# Patient Record
Sex: Female | Born: 1955 | Race: White | Hispanic: No | Smoking: Former smoker
Health system: Southern US, Community
[De-identification: ages and names within clinical notes are randomized; demographics above are authoritative.]

## PROBLEM LIST (undated history)

## (undated) DIAGNOSIS — I10 Essential (primary) hypertension: Secondary | ICD-10-CM

## (undated) DIAGNOSIS — F329 Major depressive disorder, single episode, unspecified: Secondary | ICD-10-CM

## (undated) DIAGNOSIS — F32A Depression, unspecified: Secondary | ICD-10-CM

## (undated) DIAGNOSIS — G4700 Insomnia, unspecified: Secondary | ICD-10-CM

## (undated) DIAGNOSIS — R569 Unspecified convulsions: Secondary | ICD-10-CM

## (undated) DIAGNOSIS — T7840XA Allergy, unspecified, initial encounter: Secondary | ICD-10-CM

## (undated) DIAGNOSIS — M1990 Unspecified osteoarthritis, unspecified site: Secondary | ICD-10-CM

## (undated) DIAGNOSIS — F419 Anxiety disorder, unspecified: Secondary | ICD-10-CM

## (undated) DIAGNOSIS — J45909 Unspecified asthma, uncomplicated: Secondary | ICD-10-CM

## (undated) DIAGNOSIS — Z87442 Personal history of urinary calculi: Secondary | ICD-10-CM

## (undated) HISTORY — DX: Unspecified asthma, uncomplicated: J45.909

## (undated) HISTORY — PX: CHOLECYSTECTOMY: SHX55

## (undated) HISTORY — DX: Anxiety disorder, unspecified: F41.9

## (undated) HISTORY — PX: ELBOW SURGERY: SHX618

## (undated) HISTORY — PX: JOINT REPLACEMENT: SHX530

## (undated) HISTORY — PX: OTHER SURGICAL HISTORY: SHX169

## (undated) HISTORY — DX: Allergy, unspecified, initial encounter: T78.40XA

## (undated) HISTORY — DX: Insomnia, unspecified: G47.00

## (undated) HISTORY — DX: Unspecified convulsions: R56.9

## (undated) HISTORY — PX: APPENDECTOMY: SHX54

---

## 1999-09-22 ENCOUNTER — Ambulatory Visit (HOSPITAL_BASED_OUTPATIENT_CLINIC_OR_DEPARTMENT_OTHER): Admission: RE | Admit: 1999-09-22 | Discharge: 1999-09-22 | Payer: Self-pay | Admitting: Orthopedic Surgery

## 2003-09-26 ENCOUNTER — Emergency Department (HOSPITAL_COMMUNITY): Admission: EM | Admit: 2003-09-26 | Discharge: 2003-09-26 | Payer: Self-pay | Admitting: Emergency Medicine

## 2003-11-14 ENCOUNTER — Ambulatory Visit (HOSPITAL_COMMUNITY): Payer: Self-pay | Admitting: Professional Counselor

## 2003-11-19 ENCOUNTER — Ambulatory Visit (HOSPITAL_COMMUNITY): Payer: Self-pay | Admitting: Psychiatry

## 2003-12-02 ENCOUNTER — Ambulatory Visit (HOSPITAL_COMMUNITY): Payer: Self-pay | Admitting: Professional Counselor

## 2003-12-17 ENCOUNTER — Ambulatory Visit (HOSPITAL_COMMUNITY): Payer: Self-pay | Admitting: Psychiatry

## 2004-02-25 ENCOUNTER — Ambulatory Visit (HOSPITAL_COMMUNITY): Payer: Self-pay | Admitting: Psychiatry

## 2004-05-12 ENCOUNTER — Ambulatory Visit (HOSPITAL_COMMUNITY): Payer: Self-pay | Admitting: Psychiatry

## 2004-08-16 ENCOUNTER — Ambulatory Visit (HOSPITAL_COMMUNITY): Payer: Self-pay | Admitting: Psychiatry

## 2004-10-18 ENCOUNTER — Ambulatory Visit (HOSPITAL_COMMUNITY): Payer: Self-pay | Admitting: Psychiatry

## 2005-03-21 ENCOUNTER — Ambulatory Visit (HOSPITAL_COMMUNITY): Payer: Self-pay | Admitting: Psychiatry

## 2005-06-22 ENCOUNTER — Ambulatory Visit (HOSPITAL_COMMUNITY): Payer: Self-pay | Admitting: Psychiatry

## 2005-09-19 ENCOUNTER — Ambulatory Visit (HOSPITAL_COMMUNITY): Payer: Self-pay | Admitting: Psychiatry

## 2006-04-21 ENCOUNTER — Emergency Department (HOSPITAL_COMMUNITY): Admission: EM | Admit: 2006-04-21 | Discharge: 2006-04-21 | Payer: Self-pay | Admitting: *Deleted

## 2009-06-19 ENCOUNTER — Emergency Department (HOSPITAL_COMMUNITY): Admission: EM | Admit: 2009-06-19 | Discharge: 2009-06-19 | Payer: Self-pay | Admitting: Emergency Medicine

## 2009-09-18 ENCOUNTER — Emergency Department (HOSPITAL_COMMUNITY): Admission: EM | Admit: 2009-09-18 | Discharge: 2009-09-18 | Payer: Self-pay | Admitting: Emergency Medicine

## 2010-01-16 ENCOUNTER — Encounter
Admission: RE | Admit: 2010-01-16 | Discharge: 2010-01-16 | Payer: Self-pay | Source: Home / Self Care | Attending: Neurosurgery | Admitting: Neurosurgery

## 2010-03-04 ENCOUNTER — Ambulatory Visit: Payer: Medicaid Other | Attending: Orthopedic Surgery | Admitting: Physical Therapy

## 2010-03-04 DIAGNOSIS — M6281 Muscle weakness (generalized): Secondary | ICD-10-CM | POA: Insufficient documentation

## 2010-03-04 DIAGNOSIS — R262 Difficulty in walking, not elsewhere classified: Secondary | ICD-10-CM | POA: Insufficient documentation

## 2010-03-04 DIAGNOSIS — M25669 Stiffness of unspecified knee, not elsewhere classified: Secondary | ICD-10-CM | POA: Insufficient documentation

## 2010-03-04 DIAGNOSIS — M25569 Pain in unspecified knee: Secondary | ICD-10-CM | POA: Insufficient documentation

## 2010-03-04 DIAGNOSIS — IMO0001 Reserved for inherently not codable concepts without codable children: Secondary | ICD-10-CM | POA: Insufficient documentation

## 2010-03-05 ENCOUNTER — Ambulatory Visit (HOSPITAL_COMMUNITY): Payer: Self-pay | Admitting: Psychiatry

## 2010-03-15 ENCOUNTER — Encounter: Payer: Medicaid Other | Admitting: Physical Therapy

## 2010-03-23 ENCOUNTER — Encounter: Payer: Medicaid Other | Admitting: Physical Therapy

## 2010-04-05 ENCOUNTER — Encounter: Payer: Medicaid Other | Admitting: Physical Therapy

## 2010-04-19 ENCOUNTER — Encounter: Payer: Medicaid Other | Admitting: Physical Therapy

## 2010-04-28 ENCOUNTER — Ambulatory Visit: Payer: Medicaid Other | Admitting: Physical Therapy

## 2010-05-21 NOTE — Op Note (Signed)
[facility]  Patient:    Carla Leonard, Carla Leonard                      MRN: [id number] Proc. Date: 09/22/99 Adm. Date:  [id number] Attending:  Milly Jakob                           Operative Report  PREOPERATIVE DIAGNOSIS:  Lateral epicondylitis.  POSTOPERATIVE DIAGNOSES: 1. Lateral epicondylitis 2. Radiocapitellar degenerative change.  PROCEDURE: 1. Nirschl procedure with removal of origin of extensor carpi radialis brevis. 2. Irrigation and debridement of radiocapitellar joint.  SURGEON:  Harvie Junior, M.D.  ASSISTANT:  Currie Paris. Thedore Mins.  ANESTHESIA:  General.  BRIEF HISTORY:  This is 55 year old female with a long history of having significant pain in the area of the right elbow after an injury at work.  This was treated conservatively and she had improvement and then recurrence of her symptoms and ultimately proceeded down hill through 3 injections and physical therapy.  She did have an EMG performed which showed she had no radial nerve abnormalities and because of persistent pain and failure of conservative care she was taken to the operating room for debridement of lateral epicondylar tissue.  PROCEDURE:  The patient was taken to the operating room and after adequate anesthesia was obtained by general anesthetic the patient was placed on the operating table.  The right arm was then prepped and draped in the usual sterile fashion.  Following Esmarch exsanguination with tourniquet the blood pressure tourniquet was inflated to 250 mmHg.  Following this a curvilinear incision was made and centered over the lateral epicondyle.  Subcutaneous tissue was incised to the level of the epicondylar fascia.  The division between the extensor carpi radialis longus and the extensor digitorum communis was identified and longitudinally incised.  Flaps were raised over the lateral epicondylar tissue.  The extensor carpi radialis brevis was  identified and noted to have really a injury where the brevis had pulled away from bone.  The diseased tissue was then excised and significant amounts of joint fluid did come through a rent in the brevis origin.  At that point small flecks of cartilage were coming out of this small opening in the brevis origin and so the joint was then opened and inspected.  There was small degenerative changes in the joint.  This was copiously irrigated and suctioned dry.  All loosened fragmented pieces were irrigated and debrided at this point.  The extensor brevis origin was then reattached to bone after the bone was freshened with a rongeur and good bleeding surface was identified.  Following this the interval between the extensor longus and the extensor digitorum communis was closed with 0 Vicryl interrupted suture.  The subcutaneous was then closed with 0 and 2-0 Vicryl and the skin with a nylon suture.  At this point a sterile compressive dressing was applied and the patient was placed into a posterior splint and taken to recovery room.  She was noted to be in satisfactory condition.  ESTIMATED BLOOD LOSS:  None. DD:  09/22/99 TD:  09/23/99 Job: [id number]

## 2010-06-07 ENCOUNTER — Encounter (HOSPITAL_COMMUNITY)
Admission: RE | Admit: 2010-06-07 | Discharge: 2010-06-07 | Disposition: A | Discharge status: Home / Self Care | Payer: Medicaid Other | Source: Ambulatory Visit | Attending: Orthopedic Surgery | Admitting: Orthopedic Surgery

## 2010-06-07 ENCOUNTER — Other Ambulatory Visit (HOSPITAL_COMMUNITY): Payer: Self-pay | Admitting: Orthopedic Surgery

## 2010-06-07 ENCOUNTER — Ambulatory Visit (HOSPITAL_COMMUNITY)
Admission: RE | Admit: 2010-06-07 | Discharge: 2010-06-07 | Disposition: A | Discharge status: Home / Self Care | Payer: Medicaid Other | Source: Ambulatory Visit | Attending: Orthopedic Surgery | Admitting: Orthopedic Surgery

## 2010-06-07 DIAGNOSIS — Z01810 Encounter for preprocedural cardiovascular examination: Secondary | ICD-10-CM | POA: Insufficient documentation

## 2010-06-07 DIAGNOSIS — Z01811 Encounter for preprocedural respiratory examination: Secondary | ICD-10-CM

## 2010-06-07 DIAGNOSIS — Z01812 Encounter for preprocedural laboratory examination: Secondary | ICD-10-CM | POA: Insufficient documentation

## 2010-06-07 DIAGNOSIS — I10 Essential (primary) hypertension: Secondary | ICD-10-CM | POA: Insufficient documentation

## 2010-06-07 DIAGNOSIS — F17200 Nicotine dependence, unspecified, uncomplicated: Secondary | ICD-10-CM | POA: Insufficient documentation

## 2010-06-07 DIAGNOSIS — Z01818 Encounter for other preprocedural examination: Secondary | ICD-10-CM | POA: Insufficient documentation

## 2010-06-07 DIAGNOSIS — M47814 Spondylosis without myelopathy or radiculopathy, thoracic region: Secondary | ICD-10-CM | POA: Insufficient documentation

## 2010-06-07 LAB — DIFFERENTIAL
Basophils Absolute: 0.1 10*3/uL (ref 0.0–0.1)
Basophils Relative: 1 % (ref 0–1)
Eosinophils Relative: 5 % (ref 0–5)
Lymphocytes Relative: 26 % (ref 12–46)
Lymphs Abs: 2.4 10*3/uL (ref 0.7–4.0)
Monocytes Absolute: 0.8 10*3/uL (ref 0.1–1.0)
Monocytes Relative: 9 % (ref 3–12)
Neutro Abs: 5.3 10*3/uL (ref 1.7–7.7)
Neutrophils Relative %: 59 % (ref 43–77)

## 2010-06-07 LAB — URINE MICROSCOPIC-ADD ON

## 2010-06-07 LAB — COMPREHENSIVE METABOLIC PANEL
AST: 29 U/L (ref 0–37)
BUN: 8 mg/dL (ref 6–23)
Creatinine, Ser: 1.01 mg/dL (ref 0.4–1.2)
GFR calc non Af Amer: 57 mL/min — ABNORMAL LOW (ref >60)
Glucose, Bld: 96 mg/dL (ref 70–99)
Sodium: 142 mEq/L (ref 135–145)

## 2010-06-07 LAB — URINALYSIS, ROUTINE W REFLEX MICROSCOPIC
Bilirubin Urine: NEGATIVE
Glucose, UA: NEGATIVE mg/dL
Hgb urine dipstick: NEGATIVE
Ketones, ur: NEGATIVE mg/dL
Specific Gravity, Urine: 1.018 (ref 1.005–1.030)
pH: 7.5 (ref 5.0–8.0)

## 2010-06-07 LAB — CBC
HCT: 45.3 % (ref 36.0–46.0)
MCHC: 32.7 g/dL (ref 30.0–36.0)
RBC: 4.73 MIL/uL (ref 3.87–5.11)

## 2010-06-07 LAB — TYPE AND SCREEN: ABO/RH(D): O POS

## 2010-06-07 LAB — SURGICAL PCR SCREEN: Staphylococcus aureus: NEGATIVE

## 2010-06-07 LAB — APTT: aPTT: 46 seconds — ABNORMAL HIGH (ref 24–37)

## 2010-06-07 LAB — PROTIME-INR: INR: 1.01 (ref 0.00–1.49)

## 2010-06-09 ENCOUNTER — Inpatient Hospital Stay (HOSPITAL_COMMUNITY)
Admission: RE | Admit: 2010-06-09 | Discharge: 2010-06-13 | DRG: 470 | Disposition: A | Discharge status: Home / Self Care | Payer: Medicare Other | Source: Ambulatory Visit | Attending: Orthopedic Surgery | Admitting: Orthopedic Surgery

## 2010-06-09 DIAGNOSIS — M1710 Unilateral primary osteoarthritis, unspecified knee: Principal | ICD-10-CM | POA: Diagnosis present

## 2010-06-09 DIAGNOSIS — J4489 Other specified chronic obstructive pulmonary disease: Secondary | ICD-10-CM | POA: Diagnosis present

## 2010-06-09 DIAGNOSIS — J449 Chronic obstructive pulmonary disease, unspecified: Secondary | ICD-10-CM | POA: Diagnosis present

## 2010-06-09 DIAGNOSIS — I10 Essential (primary) hypertension: Secondary | ICD-10-CM | POA: Diagnosis present

## 2010-06-09 LAB — PROTIME-INR: INR: 1 (ref 0.00–1.49)

## 2010-06-10 LAB — CBC
MCHC: 32.2 g/dL (ref 30.0–36.0)
MCV: 95.5 fL (ref 78.0–100.0)
Platelets: 253 10*3/uL (ref 150–400)
RBC: 4 MIL/uL (ref 3.87–5.11)
RDW: 14.2 % (ref 11.5–15.5)
WBC: 9.7 10*3/uL (ref 4.0–10.5)

## 2010-06-10 LAB — PROTIME-INR: Prothrombin Time: 15.4 seconds — ABNORMAL HIGH (ref 11.6–15.2)

## 2010-06-10 NOTE — Op Note (Signed)
NAMEMarland Kitchen  AVRIL, BUSSER NO.:  [id number]  MEDICAL RECORD NO.:  [id number]  LOCATION:  5039                         FACILITY:  [facility]  PHYSICIAN:  Harvie Junior, M.D.   DATE OF BIRTH:  [birth year]  DATE OF PROCEDURE:  06/09/2010 DATE OF DISCHARGE:                              OPERATIVE REPORT   PREOPERATIVE NOTE:  End-stage degenerative joint disease right knee.  POSTOPERATIVE DIAGNOSIS:  End-stage degenerative joint disease right knee.  PROCEDURE:  Right total knee replacement with a Sigma system size 3 femur, size 3 MBT revision tray tibia, 12.5-mm bridging bearing, and a 35-mm all polyethylene patella.  SURGEON:  Harvie Junior, MD  ASSISTANT:  Marshia Ly, PA  ANESTHESIA:  General.  BRIEF HISTORY:  Carla Leonard is a 55 year old female with a long history of having significant degenerative joint disease in bilateral knees. She had been treated conservatively for a  long period of time.  Because of continued complaints of pain, she was ultimately evaluated in the office and felt to need bilateral total knee replacement.  We had a long talk regarding treatment options and felt that given her large size that a single knee replacement at that time would be most appropriate and she was brought to the operating room for right total knee replacement after failure of all conservative care.  Because of her large size, it was chosen that a computer would be appropriate to try a perfect neutral long alignment, we felt that an MBT revision tray would be appropriate in trying to minimize the need for tibial revision.  PROCEDURE:  The patient was brought to the operating room.  After adequate anesthesia was obtained with general anesthetic, the patient was placed supine on the operating table.  The right leg was then prepped and draped in usual sterile fashion.  Following this, the leg was exsanguinated and blood pressure tourniquet inflated to 350  mmHg. Following this, a midline incision was made subcutaneously extending down the level of the extensor mechanism and a medial parapatellar arthrotomy was undertaken.  Once this was done, the anterior and posterior cruciates were excised as well as the retropatellar fat pad, medial and lateral meniscus and the synovium on the anterior aspect of the femur.  Following this, the computer modules were placed, two pins in the tibia, two pins in the femur, and the arrays were placed and the registration process undertaken using the computer for 30 minutes of surgical procedure.  Once this was completed, attention was turned towards the tibia which was cut perpendicular to its long axis with computer assistance.  Attention was then turned to the femur which cut perpendicular to its long and to the anatomic axis under computer assistance.  After this was done, spacer blocks were put into place. Once that was completed, we got easy neutral long alignment and attention was then turned to the femur where anterior-posterior cuts were made as well as chamfer cuts and a box cut.  Once that was completed, then the attention was turned to the tibia which was sized, drilled and keeled and a size 3 MBT revision trial was put in place and a size 3 femur was put in  place.  Once that was completed now attention was turned towards the patella which was cut down to the level of 14, then a trial 35-mm patella was put in place.  Once that was completed, the trials were in place and the components were then removed.  The knee was copiously and thoroughly lavaged and suctioned dry.  Once that was completed, the final components were then cemented into place with a size 3 tibia, size 3 femur and once it was completed, the final components were cemented into place, sized with 3 MBT revision tray, size 3 femur, 12.5-mm bridging bearing, and 35-mm all poly patella. Once the cement was allowed to harden, tourniquet  was let down.  All bleeders controlled with electrocautery and the medium Hemovac drain was then placed.  The final poly was then placed and the knee was then closed.  The medial parapatellar arthrotomy was closed with 1 Vicryl running, skin with 0 and 2-0 Vicryl and skin staples.  Sterile compressive dressing was applied as well as knee immobilizer.  The patient was taken to recovery room where she was noted to be in satisfactory condition.  Estimated blood loss during the procedure was less than 50 mL.     Harvie Junior, M.D.     Ranae Plumber  D:  06/09/2010  T:  06/10/2010  Job:  [id number]  Electronically Signed by Jodi Geralds M.D. on 06/10/2010 02:13:24 PM

## 2010-06-11 LAB — CBC
HCT: 36.9 % (ref 36.0–46.0)
MCHC: 31.7 g/dL (ref 30.0–36.0)
MCV: 95.6 fL (ref 78.0–100.0)
Platelets: 252 10*3/uL (ref 150–400)
RBC: 3.86 MIL/uL — ABNORMAL LOW (ref 3.87–5.11)
RDW: 14.4 % (ref 11.5–15.5)
WBC: 9.7 10*3/uL (ref 4.0–10.5)

## 2010-06-11 LAB — PROTIME-INR
INR: 1.73 — ABNORMAL HIGH (ref 0.00–1.49)
Prothrombin Time: 20.4 seconds — ABNORMAL HIGH (ref 11.6–15.2)

## 2010-06-12 ENCOUNTER — Inpatient Hospital Stay (HOSPITAL_COMMUNITY): Payer: Medicare Other

## 2010-06-12 LAB — CBC
HCT: 32.3 % — ABNORMAL LOW (ref 36.0–46.0)
Hemoglobin: 10.3 g/dL — ABNORMAL LOW (ref 12.0–15.0)
RBC: 3.41 MIL/uL — ABNORMAL LOW (ref 3.87–5.11)
WBC: 8.2 10*3/uL (ref 4.0–10.5)

## 2010-06-12 LAB — PROTIME-INR
INR: 3.81 — ABNORMAL HIGH (ref 0.00–1.49)
Prothrombin Time: 37.5 seconds — ABNORMAL HIGH (ref 11.6–15.2)

## 2010-06-13 LAB — PROTIME-INR
INR: 3.73 — ABNORMAL HIGH (ref 0.00–1.49)
Prothrombin Time: 36.9 seconds — ABNORMAL HIGH (ref 11.6–15.2)

## 2010-06-16 ENCOUNTER — Emergency Department (HOSPITAL_COMMUNITY)
Admission: EM | Admit: 2010-06-16 | Discharge: 2010-06-16 | Disposition: A | Discharge status: Home / Self Care | Payer: Medicaid Other | Attending: Emergency Medicine | Admitting: Emergency Medicine

## 2010-06-16 DIAGNOSIS — Z96659 Presence of unspecified artificial knee joint: Secondary | ICD-10-CM | POA: Insufficient documentation

## 2010-06-16 DIAGNOSIS — F3289 Other specified depressive episodes: Secondary | ICD-10-CM | POA: Insufficient documentation

## 2010-06-16 DIAGNOSIS — M7989 Other specified soft tissue disorders: Secondary | ICD-10-CM | POA: Insufficient documentation

## 2010-06-16 DIAGNOSIS — M79609 Pain in unspecified limb: Secondary | ICD-10-CM

## 2010-06-16 DIAGNOSIS — F329 Major depressive disorder, single episode, unspecified: Secondary | ICD-10-CM | POA: Insufficient documentation

## 2010-06-16 LAB — CBC
Hemoglobin: 10.8 g/dL — ABNORMAL LOW (ref 12.0–15.0)
MCH: 29.8 pg (ref 26.0–34.0)
Platelets: 305 10*3/uL (ref 150–400)
RBC: 3.63 MIL/uL — ABNORMAL LOW (ref 3.87–5.11)
WBC: 9.1 10*3/uL (ref 4.0–10.5)

## 2010-06-16 LAB — DIFFERENTIAL
Basophils Absolute: 0.1 10*3/uL (ref 0.0–0.1)
Basophils Relative: 1 % (ref 0–1)
Eosinophils Absolute: 0.9 10*3/uL — ABNORMAL HIGH (ref 0.0–0.7)
Monocytes Relative: 10 % (ref 3–12)
Neutro Abs: 5.4 10*3/uL (ref 1.7–7.7)
Neutrophils Relative %: 60 % (ref 43–77)

## 2010-06-16 LAB — BASIC METABOLIC PANEL
CO2: 32 mEq/L (ref 19–32)
Calcium: 9.5 mg/dL (ref 8.4–10.5)
Potassium: 3.3 mEq/L — ABNORMAL LOW (ref 3.5–5.1)
Sodium: 138 mEq/L (ref 135–145)

## 2010-07-21 ENCOUNTER — Ambulatory Visit: Payer: Medicaid Other | Attending: Orthopedic Surgery | Admitting: Physical Therapy

## 2010-07-21 DIAGNOSIS — M25569 Pain in unspecified knee: Secondary | ICD-10-CM | POA: Insufficient documentation

## 2010-07-21 DIAGNOSIS — IMO0001 Reserved for inherently not codable concepts without codable children: Secondary | ICD-10-CM | POA: Insufficient documentation

## 2010-07-21 DIAGNOSIS — M25669 Stiffness of unspecified knee, not elsewhere classified: Secondary | ICD-10-CM | POA: Insufficient documentation

## 2010-07-21 DIAGNOSIS — R262 Difficulty in walking, not elsewhere classified: Secondary | ICD-10-CM | POA: Insufficient documentation

## 2010-07-21 NOTE — Discharge Summary (Signed)
NAMEMarland Kitchen  ROREY, HODGES NO.:  [id number]  MEDICAL RECORD NO.:  [id number]  LOCATION:  5039                         FACILITY:  [facility]  PHYSICIAN:  Harvie Junior, M.D.   DATE OF BIRTH:  1955-06-13  DATE OF ADMISSION:  06/09/2010 DATE OF DISCHARGE:  06/13/2010                              DISCHARGE SUMMARY   ADMITTING DIAGNOSES: 1. End-stage degenerative joint disease, right knee. 2. Hypertension. 3. Chronic depression. 4. Chronic low back pain. 5. Morbid obesity.  DISCHARGE DIAGNOSES: 1. End-stage degenerative joint disease, right knee. 2. Hypertension. 3. Chronic depression. 4. Chronic low back pain. 5. Morbid obesity.  PROCEDURES IN HOSPITAL:  Right total knee arthroplasty computer- assisted, Jodi Geralds MD on June 09, 2010.  CONSULTATIONS IN HOSPITAL:  None.  BRIEF HISTORY:  Carla Leonard is a 55 year old female who is seen with a chief complaint of right knee pain.  She has positive night pain and positive pain with ambulation.  X-ray shows she has bone-on-bone degenerative arthritis in her right knee.  She got no relief from exhaustive conservative treatment including knee arthroscopy in February of this year of both knees.  She had severe grade IV degenerative changes noted at the time of this arthroscopy.  She continued to have pain despite exhaustive conservative treatment including injection therapy, modification of activity, use of medication and therapies. Based upon her clinical and radiographic findings, she is felt to be a candidate for a right total knee replacement and is admitted for this.  ALLERGIES:  SULFA and NSAIDs.  PERTINENT LABORATORY STUDIES: 1. The patient's hemoglobin on admission was 14.8 with hematocrit of     45.3, potassium was 4.3.  On postoperative day #1, her hemoglobin     was 12.3 with hematocrit of 38.2. 2. Hemoglobin was 11.7, postop day #3 it was 10.3 with hematocrit of     32.3.  Pro time on admission was 13.4  seconds and INR of 1.0 and a     PTT of 45.  On the date of discharge, her pro time was 36.9 seconds     with INR of 3.73 on Coumadin therapy.  HOSPITAL COURSE:  The patient was given a gram of Ancef IV preoperatively as well as 80 mg of IV gentamicin and was taken to the operating room where she underwent a right total knee replacement, computer-assisted as well described in Dr. Luiz Blare' operative note on June 09, 2010.  Postoperatively, she was put on the PCA morphine pump for pain control.  CPM machine was used.  She was started on Coumadin for DVT prophylaxis.  Physical therapy was ordered for walker ambulation, weightbearing as tolerated on the right side.  On postop day #1, the patient was complaining of right knee pain.  She is taking fluids without difficulty.  Foley catheter was removed that morning.  She was up to the chair.  Vital signs were stable.  Her O2 sats were 100% on 2 liters of oxygen.  Hemoglobin was 12.3.  INR was 1.2.  Foley catheter was removed and the patient mobilized with physical therapy.  We will continue her on the PCA morphine pump for pain control.  On postop day #2, she  had continued complaints of right knee pain.  She is taking fluids and voiding without difficulty.  She made slow progress with physical therapy.  Her IV was converted to a saline lock and her PCA morphine pump was discontinued.  We started on OxyContin 20 mg b.i.d. along with Percocet for breakthrough pain.  Her dressing was changed and Hemovac drain was pulled from her right knee.  She was in need of additional physical therapy for the patient's safety and on postop day #3, still was little bit unstable.  I think she was at danger of falling at home.  On postop day #4, she is without complaints.  She is taking fluids and voiding without difficulty.  Her right knee wound was benign. Calf was soft and view was intact.  Her INR was 3.73.  She is discharged home in improved condition.  She is  on a regular diet.  Her saline lock was discontinued.  She will be in need of home health physical therapy and home health RN for pro times and Coumadin management.  Her medications at discharge will include: 1. Klonopin 2 mg 1 b.i.d. 2. Effexor XR 75 mg 3 capsules daily. 3. Cymbalta 60 mg 1 daily. 4. Bystolic 5 mg 1 daily. 5. Oxycodone CR 30 mg 1 q.6 h. 6. Vitamin D 2000 units 1 daily. 7. Percocet 5 mg 1-2 every 4-6 hours as needed for breakthrough pain. 8. Robaxin 750 mg 1 every 8 hours as needed for spasm. 9. Coumadin as directed x1 month postop.  She will hold on the date of     discharge and the home health nurse will advise when to restart at.  She will follow up with Dr. Luiz Blare in the office in 2 weeks sooner should any problems occur.     Carla Leonard, P.A.   ______________________________ Harvie Junior, M.D.    Cordelia Pen  D:  07/16/2010  T:  07/17/2010  Job:  [id number]  cc:   Donnel Saxon, MD Harvie Junior, M.D.  Electronically Signed by Carla Leonard P.A. on 07/21/2010 04:45:44 PM Electronically Signed by Jodi Geralds M.D. on 07/21/2010 09:09:23 PM

## 2010-08-03 ENCOUNTER — Ambulatory Visit: Payer: Medicaid Other | Admitting: Physical Therapy

## 2010-08-05 ENCOUNTER — Ambulatory Visit: Payer: Medicaid Other | Attending: Orthopedic Surgery | Admitting: Physical Therapy

## 2010-08-05 DIAGNOSIS — M25569 Pain in unspecified knee: Secondary | ICD-10-CM | POA: Insufficient documentation

## 2010-08-05 DIAGNOSIS — R262 Difficulty in walking, not elsewhere classified: Secondary | ICD-10-CM | POA: Insufficient documentation

## 2010-08-05 DIAGNOSIS — IMO0001 Reserved for inherently not codable concepts without codable children: Secondary | ICD-10-CM | POA: Insufficient documentation

## 2010-08-05 DIAGNOSIS — M25669 Stiffness of unspecified knee, not elsewhere classified: Secondary | ICD-10-CM | POA: Insufficient documentation

## 2010-08-09 ENCOUNTER — Ambulatory Visit: Payer: Medicaid Other | Admitting: Physical Therapy

## 2010-08-11 ENCOUNTER — Ambulatory Visit: Payer: Medicaid Other | Admitting: Physical Therapy

## 2010-08-17 ENCOUNTER — Emergency Department (HOSPITAL_COMMUNITY)
Admission: EM | Admit: 2010-08-17 | Discharge: 2010-08-17 | Discharge status: Against Medical Advice | Payer: Medicaid Other | Attending: Emergency Medicine | Admitting: Emergency Medicine

## 2010-08-17 DIAGNOSIS — Z0389 Encounter for observation for other suspected diseases and conditions ruled out: Secondary | ICD-10-CM | POA: Insufficient documentation

## 2010-08-17 LAB — URINALYSIS, ROUTINE W REFLEX MICROSCOPIC
Glucose, UA: NEGATIVE mg/dL
Protein, ur: NEGATIVE mg/dL
Specific Gravity, Urine: 1.025 (ref 1.005–1.030)
Urobilinogen, UA: 0.2 mg/dL (ref 0.0–1.0)

## 2010-08-17 LAB — URINE MICROSCOPIC-ADD ON

## 2011-04-27 ENCOUNTER — Encounter (HOSPITAL_COMMUNITY): Payer: Self-pay | Admitting: Pharmacy Technician

## 2011-05-03 ENCOUNTER — Inpatient Hospital Stay (HOSPITAL_COMMUNITY): Admission: RE | Admit: 2011-05-03 | Payer: Medicare Other | Source: Ambulatory Visit

## 2011-05-06 ENCOUNTER — Ambulatory Visit (HOSPITAL_COMMUNITY): Admission: RE | Admit: 2011-05-06 | Payer: Medicare Other | Source: Ambulatory Visit | Admitting: Orthopedic Surgery

## 2011-05-06 ENCOUNTER — Encounter (HOSPITAL_COMMUNITY): Admission: RE | Payer: Self-pay | Source: Ambulatory Visit

## 2011-05-06 SURGERY — ARTHROPLASTY, KNEE, TOTAL, USING IMAGELESS COMPUTER-ASSISTED NAVIGATION
Anesthesia: General | Laterality: Right

## 2011-05-06 SURGERY — ARTHROPLASTY, KNEE, TOTAL, USING IMAGELESS COMPUTER-ASSISTED NAVIGATION
Anesthesia: General | Laterality: Left

## 2011-06-06 ENCOUNTER — Encounter (HOSPITAL_COMMUNITY): Payer: Self-pay | Admitting: Respiratory Therapy

## 2011-06-07 ENCOUNTER — Other Ambulatory Visit: Payer: Self-pay | Admitting: Orthopedic Surgery

## 2011-06-08 ENCOUNTER — Encounter (HOSPITAL_COMMUNITY): Payer: Self-pay | Admitting: *Deleted

## 2011-06-09 NOTE — Pre-Procedure Instructions (Signed)
20 Carla Leonard   06/09/2011   Your procedure is scheduled on:  June 14TH, Friday   Report to [facility] at  5:30 AM.  Call this number if you have problems the morning of surgery: [phone]   Remember:   Do not eat food:After Midnight  THURSDAY.  May have clear liquids: up to 4 Hours before arrival time--- 1:30 AM.  Clear liquids include soda, tea, black coffee, apple or grape juice, broth.   Take these medicines the morning of surgery with A SIP OF WATER:  TOPROL, pain medicine   Do not wear jewelry, make-up or nail polish.  Do not wear lotions, powders, or perfumes. You may wear deodorant.   Do not shave 48 hours prior to surgery. Men may shave face and neck.   Do not bring valuables to the hospital.  Contacts, dentures or bridgework may not be worn into surgery.  Leave suitcase in the car. After surgery it may be brought to your room.  For patients admitted to the hospital, checkout time is 11:00 AM the day of discharge.   Patients discharged the day of surgery will not be allowed to drive home.  Name and phone number of your driver:   Special Instructions: CHG Shower Use Special Wash: 1/2 bottle night before surgery and 1/2 bottle morning of surgery.   Please read over the following fact sheets that you were given: Pain Booklet, Coughing and Deep Breathing, MRSA Information and Surgical Site Infection Prevention

## 2011-06-10 ENCOUNTER — Ambulatory Visit (HOSPITAL_COMMUNITY)
Admission: RE | Admit: 2011-06-10 | Discharge: 2011-06-10 | Disposition: A | Discharge status: Home / Self Care | Payer: Medicare Other | Source: Ambulatory Visit | Attending: Anesthesiology | Admitting: Anesthesiology

## 2011-06-10 ENCOUNTER — Encounter (HOSPITAL_COMMUNITY)
Admission: RE | Admit: 2011-06-10 | Discharge: 2011-06-10 | Disposition: A | Discharge status: Home / Self Care | Payer: Medicare Other | Source: Ambulatory Visit | Attending: Orthopedic Surgery | Admitting: Orthopedic Surgery

## 2011-06-10 DIAGNOSIS — Z01818 Encounter for other preprocedural examination: Secondary | ICD-10-CM | POA: Insufficient documentation

## 2011-06-10 LAB — TYPE AND SCREEN
ABO/RH(D): O POS
Antibody Screen: NEGATIVE

## 2011-06-10 LAB — CBC
HCT: 43.8 % (ref 36.0–46.0)
Hemoglobin: 14.1 g/dL (ref 12.0–15.0)
MCHC: 32.2 g/dL (ref 30.0–36.0)
RBC: 4.51 MIL/uL (ref 3.87–5.11)
WBC: 8.8 10*3/uL (ref 4.0–10.5)

## 2011-06-10 LAB — BASIC METABOLIC PANEL
BUN: 8 mg/dL (ref 6–23)
Chloride: 103 mEq/L (ref 96–112)
GFR calc Af Amer: >90 mL/min (ref >90)
GFR calc non Af Amer: >90 mL/min (ref >90)
Potassium: 4.1 mEq/L (ref 3.5–5.1)
Sodium: 141 mEq/L (ref 135–145)

## 2011-06-10 LAB — SURGICAL PCR SCREEN
MRSA, PCR: NEGATIVE
Staphylococcus aureus: POSITIVE — AB

## 2011-06-13 ENCOUNTER — Encounter (HOSPITAL_COMMUNITY): Payer: Self-pay | Admitting: Vascular Surgery

## 2011-06-13 NOTE — Consult Note (Signed)
Anesthesia Chart Review:  Patient is a 56 year old female scheduled for left TKA on 06/17/11.  History includes morbid obesity with BMI 54.8, HTN, depression, arthritis.  She is s/p right TKA on 06/09/10. Smoking status was not documented by her PAT nurse.  Labs noted.  Orders were not signed at the time of her PAT appointment, so apparently her UA and coags were not done.  They have already been ordered for the day of surgery.  I left a message with Darl Pikes at Dr. Luiz Blare office to contact me or Dondra Spry in PAT scheduling if Dr. Luiz Blare feels Ms. Mccarey should come in prior to her surgery date to get these drawn.  She had a negative CXR on 06/10/11.  EKG on 06/10/11 showed SB @ 59 bpm, cannot rule out anterior infarct (age undetermined).  I think her EKG appears stable since her last one doen on 06/07/10.  No CV symptoms were documented at her PAT appointment.  If patient remains asymptomatic, then anticipate she can proceed from an Anesthesia standpoint.  Shonna Chock, PA-C

## 2011-06-16 MED ORDER — POVIDONE-IODINE 7.5 % EX SOLN
Freq: Once | CUTANEOUS | Status: DC
  Filled 2011-06-16: qty 118

## 2011-06-16 MED ORDER — CEFAZOLIN SODIUM-DEXTROSE 2-3 GM-% IV SOLR
2.0000 g | INTRAVENOUS | Status: AC
  Administered 2011-06-17: 2 g via INTRAVENOUS
  Filled 2011-06-16: qty 50

## 2011-06-17 ENCOUNTER — Encounter (HOSPITAL_COMMUNITY): Payer: Self-pay | Admitting: *Deleted

## 2011-06-17 ENCOUNTER — Ambulatory Visit (HOSPITAL_COMMUNITY): Payer: Medicare Other | Admitting: Vascular Surgery

## 2011-06-17 ENCOUNTER — Inpatient Hospital Stay (HOSPITAL_COMMUNITY)
Admission: RE | Admit: 2011-06-17 | Discharge: 2011-06-19 | DRG: 470 | Disposition: A | Discharge status: Home Health | Payer: Medicare Other | Source: Ambulatory Visit | Attending: Orthopedic Surgery | Admitting: Orthopedic Surgery

## 2011-06-17 ENCOUNTER — Encounter (HOSPITAL_COMMUNITY): Payer: Self-pay | Admitting: Vascular Surgery

## 2011-06-17 ENCOUNTER — Encounter (HOSPITAL_COMMUNITY)
Admission: RE | Disposition: A | Discharge status: Home Health | Payer: Self-pay | Source: Ambulatory Visit | Attending: Orthopedic Surgery

## 2011-06-17 DIAGNOSIS — M1710 Unilateral primary osteoarthritis, unspecified knee: Principal | ICD-10-CM | POA: Diagnosis present

## 2011-06-17 DIAGNOSIS — M1712 Unilateral primary osteoarthritis, left knee: Secondary | ICD-10-CM | POA: Diagnosis present

## 2011-06-17 DIAGNOSIS — Z96659 Presence of unspecified artificial knee joint: Secondary | ICD-10-CM

## 2011-06-17 DIAGNOSIS — IMO0002 Reserved for concepts with insufficient information to code with codable children: Principal | ICD-10-CM | POA: Diagnosis present

## 2011-06-17 DIAGNOSIS — M6740 Ganglion, unspecified site: Secondary | ICD-10-CM | POA: Diagnosis present

## 2011-06-17 DIAGNOSIS — M67431 Ganglion, right wrist: Secondary | ICD-10-CM | POA: Diagnosis present

## 2011-06-17 DIAGNOSIS — I10 Essential (primary) hypertension: Secondary | ICD-10-CM | POA: Diagnosis present

## 2011-06-17 HISTORY — DX: Depression, unspecified: F32.A

## 2011-06-17 HISTORY — DX: Unspecified osteoarthritis, unspecified site: M19.90

## 2011-06-17 HISTORY — DX: Major depressive disorder, single episode, unspecified: F32.9

## 2011-06-17 HISTORY — PX: KNEE ARTHROPLASTY: SHX992

## 2011-06-17 HISTORY — DX: Essential (primary) hypertension: I10

## 2011-06-17 LAB — COMPREHENSIVE METABOLIC PANEL
BUN: 11 mg/dL (ref 6–23)
CO2: 29 mEq/L (ref 19–32)
Chloride: 103 mEq/L (ref 96–112)
Creatinine, Ser: 0.8 mg/dL (ref 0.50–1.10)
GFR calc Af Amer: >90 mL/min (ref >90)
GFR calc non Af Amer: 81 mL/min — ABNORMAL LOW (ref >90)
Glucose, Bld: 93 mg/dL (ref 70–99)
Total Bilirubin: 0.2 mg/dL — ABNORMAL LOW (ref 0.3–1.2)

## 2011-06-17 LAB — URINALYSIS, ROUTINE W REFLEX MICROSCOPIC
Bilirubin Urine: NEGATIVE
Ketones, ur: NEGATIVE mg/dL
Nitrite: NEGATIVE
pH: 5.5 (ref 5.0–8.0)

## 2011-06-17 LAB — PROTIME-INR
INR: 1.03 (ref 0.00–1.49)
Prothrombin Time: 13.7 seconds (ref 11.6–15.2)

## 2011-06-17 LAB — URINE MICROSCOPIC-ADD ON

## 2011-06-17 SURGERY — ARTHROPLASTY, KNEE, TOTAL, USING IMAGELESS COMPUTER-ASSISTED NAVIGATION
Anesthesia: General | Site: Knee | Laterality: Left | Wound class: Clean

## 2011-06-17 MED ORDER — SODIUM CHLORIDE 0.9 % IJ SOLN: 9.0000 mL | INTRAMUSCULAR | Status: DC | PRN

## 2011-06-17 MED ORDER — HYDROMORPHONE HCL PF 1 MG/ML IJ SOLN
INTRAMUSCULAR | Status: AC
  Filled 2011-06-17: qty 1

## 2011-06-17 MED ORDER — MORPHINE SULFATE (PF) 1 MG/ML IV SOLN
INTRAVENOUS | Status: AC
  Administered 2011-06-17: 19:00:00
  Filled 2011-06-17: qty 25

## 2011-06-17 MED ORDER — PROPOFOL 10 MG/ML IV EMUL
INTRAVENOUS | Status: DC | PRN
  Administered 2011-06-17: 150 mg via INTRAVENOUS

## 2011-06-17 MED ORDER — SODIUM CHLORIDE 0.9 % IR SOLN
Status: DC | PRN
  Administered 2011-06-17: 3000 mL

## 2011-06-17 MED ORDER — METHOCARBAMOL 100 MG/ML IJ SOLN
500.0000 mg | Freq: Four times a day (QID) | INTRAVENOUS | Status: DC | PRN
  Administered 2011-06-17: 500 mg via INTRAVENOUS
  Filled 2011-06-17: qty 5

## 2011-06-17 MED ORDER — METHYLPREDNISOLONE ACETATE 40 MG/ML IJ SUSP
INTRAMUSCULAR | Status: DC | PRN
  Administered 2011-06-17: 1 mL

## 2011-06-17 MED ORDER — GLYCOPYRROLATE 0.2 MG/ML IJ SOLN
INTRAMUSCULAR | Status: DC | PRN
  Administered 2011-06-17: .7 mg via INTRAVENOUS

## 2011-06-17 MED ORDER — METHOCARBAMOL 500 MG PO TABS
500.0000 mg | ORAL_TABLET | Freq: Four times a day (QID) | ORAL | Status: DC | PRN
  Administered 2011-06-18 – 2011-06-19 (×2): 500 mg via ORAL
  Filled 2011-06-17 (×2): qty 1

## 2011-06-17 MED ORDER — LABETALOL HCL 5 MG/ML IV SOLN
INTRAVENOUS | Status: DC | PRN
  Administered 2011-06-17 (×3): 5 mg via INTRAVENOUS

## 2011-06-17 MED ORDER — FERROUS SULFATE 325 (65 FE) MG PO TABS
325.0000 mg | ORAL_TABLET | Freq: Two times a day (BID) | ORAL | Status: DC
  Administered 2011-06-17 – 2011-06-19 (×4): 325 mg via ORAL
  Filled 2011-06-17 (×6): qty 1

## 2011-06-17 MED ORDER — ARIPIPRAZOLE 5 MG PO TABS
5.0000 mg | ORAL_TABLET | Freq: Every day | ORAL | Status: DC
  Administered 2011-06-17 – 2011-06-19 (×3): 5 mg via ORAL
  Filled 2011-06-17 (×3): qty 1

## 2011-06-17 MED ORDER — ONDANSETRON HCL 4 MG/2ML IJ SOLN: 4.0000 mg | Freq: Four times a day (QID) | INTRAMUSCULAR | Status: DC | PRN

## 2011-06-17 MED ORDER — ZOLPIDEM TARTRATE 5 MG PO TABS
5.0000 mg | ORAL_TABLET | Freq: Every evening | ORAL | Status: DC | PRN
  Filled 2011-06-17: qty 1

## 2011-06-17 MED ORDER — OXYCODONE HCL 5 MG PO TABS
30.0000 mg | ORAL_TABLET | ORAL | Status: DC | PRN
  Administered 2011-06-18 – 2011-06-19 (×4): 30 mg via ORAL
  Filled 2011-06-17 (×4): qty 6

## 2011-06-17 MED ORDER — PHENYLEPHRINE HCL 10 MG/ML IJ SOLN
INTRAMUSCULAR | Status: DC | PRN
  Administered 2011-06-17 (×2): 40 ug via INTRAVENOUS

## 2011-06-17 MED ORDER — KETOROLAC TROMETHAMINE 30 MG/ML IJ SOLN
INTRAMUSCULAR | Status: AC
  Filled 2011-06-17: qty 1

## 2011-06-17 MED ORDER — HYDROMORPHONE HCL PF 1 MG/ML IJ SOLN
0.2500 mg | INTRAMUSCULAR | Status: DC | PRN
  Administered 2011-06-17: 0.5 mg via INTRAVENOUS

## 2011-06-17 MED ORDER — ONDANSETRON HCL 4 MG/2ML IJ SOLN: 4.0000 mg | Freq: Once | INTRAMUSCULAR | Status: DC | PRN

## 2011-06-17 MED ORDER — SENNOSIDES-DOCUSATE SODIUM 8.6-50 MG PO TABS: 1.0000 | ORAL_TABLET | Freq: Every evening | ORAL | Status: DC | PRN

## 2011-06-17 MED ORDER — VENLAFAXINE HCL ER 75 MG PO CP24
225.0000 mg | ORAL_CAPSULE | Freq: Every day | ORAL | Status: DC
  Administered 2011-06-18 – 2011-06-19 (×2): 225 mg via ORAL
  Filled 2011-06-17 (×3): qty 1

## 2011-06-17 MED ORDER — ONDANSETRON HCL 4 MG/2ML IJ SOLN
INTRAMUSCULAR | Status: DC | PRN
  Administered 2011-06-17: 4 mg via INTRAVENOUS

## 2011-06-17 MED ORDER — ACETAMINOPHEN 10 MG/ML IV SOLN
1000.0000 mg | Freq: Four times a day (QID) | INTRAVENOUS | Status: AC
  Administered 2011-06-17 – 2011-06-18 (×4): 1000 mg via INTRAVENOUS
  Filled 2011-06-17 (×4): qty 100

## 2011-06-17 MED ORDER — DOCUSATE SODIUM 100 MG PO CAPS
100.0000 mg | ORAL_CAPSULE | Freq: Two times a day (BID) | ORAL | Status: DC
  Administered 2011-06-17 – 2011-06-19 (×5): 100 mg via ORAL
  Filled 2011-06-17 (×6): qty 1

## 2011-06-17 MED ORDER — METHOCARBAMOL 100 MG/ML IJ SOLN
500.0000 mg | INTRAVENOUS | Status: AC
  Filled 2011-06-17: qty 5

## 2011-06-17 MED ORDER — NEOSTIGMINE METHYLSULFATE 1 MG/ML IJ SOLN
INTRAMUSCULAR | Status: DC | PRN
  Administered 2011-06-17: 4 mg via INTRAVENOUS

## 2011-06-17 MED ORDER — KETOROLAC TROMETHAMINE 15 MG/ML IJ SOLN
15.0000 mg | Freq: Four times a day (QID) | INTRAMUSCULAR | Status: AC
  Administered 2011-06-17 – 2011-06-18 (×3): 15 mg via INTRAVENOUS
  Filled 2011-06-17 (×3): qty 1

## 2011-06-17 MED ORDER — MORPHINE SULFATE (PF) 1 MG/ML IV SOLN
INTRAVENOUS | Status: DC
  Administered 2011-06-17: 10.5 mg via INTRAVENOUS
  Administered 2011-06-17: 11:00:00 via INTRAVENOUS
  Administered 2011-06-17: 12 mg via INTRAVENOUS
  Administered 2011-06-18: 10.5 mg via INTRAVENOUS
  Administered 2011-06-18: 6 mg via INTRAVENOUS
  Filled 2011-06-17: qty 25

## 2011-06-17 MED ORDER — DIPHENHYDRAMINE HCL 50 MG/ML IJ SOLN: 12.5000 mg | Freq: Four times a day (QID) | INTRAMUSCULAR | Status: DC | PRN

## 2011-06-17 MED ORDER — BUPIVACAINE HCL (PF) 0.25 % IJ SOLN
INTRAMUSCULAR | Status: DC | PRN
  Administered 2011-06-17: 1 mL

## 2011-06-17 MED ORDER — ALBUTEROL SULFATE (2.5 MG/3ML) 0.083% IN NEBU
INHALATION_SOLUTION | RESPIRATORY_TRACT | Status: DC | PRN
  Administered 2011-06-17 (×2): 1 mL via RESPIRATORY_TRACT

## 2011-06-17 MED ORDER — DEXTROSE-NACL 5-0.45 % IV SOLN
INTRAVENOUS | Status: DC
  Administered 2011-06-17 – 2011-06-18 (×2): via INTRAVENOUS

## 2011-06-17 MED ORDER — WARFARIN SODIUM 5 MG PO TABS
5.0000 mg | ORAL_TABLET | Freq: Once | ORAL | Status: AC
  Administered 2011-06-17: 5 mg via ORAL
  Filled 2011-06-17: qty 1

## 2011-06-17 MED ORDER — WARFARIN VIDEO: Freq: Once | Status: DC

## 2011-06-17 MED ORDER — DIPHENHYDRAMINE HCL 12.5 MG/5ML PO ELIX: 12.5000 mg | ORAL_SOLUTION | Freq: Four times a day (QID) | ORAL | Status: DC | PRN

## 2011-06-17 MED ORDER — ONDANSETRON HCL 4 MG PO TABS: 4.0000 mg | ORAL_TABLET | Freq: Four times a day (QID) | ORAL | Status: DC | PRN

## 2011-06-17 MED ORDER — SUCCINYLCHOLINE CHLORIDE 20 MG/ML IJ SOLN
INTRAMUSCULAR | Status: DC | PRN
  Administered 2011-06-17: 100 mg via INTRAVENOUS

## 2011-06-17 MED ORDER — ALUM & MAG HYDROXIDE-SIMETH 200-200-20 MG/5ML PO SUSP: 30.0000 mL | ORAL | Status: DC | PRN

## 2011-06-17 MED ORDER — METOPROLOL SUCCINATE ER 25 MG PO TB24
25.0000 mg | ORAL_TABLET | Freq: Every day | ORAL | Status: DC
  Administered 2011-06-17 – 2011-06-19 (×3): 25 mg via ORAL
  Filled 2011-06-17 (×3): qty 1

## 2011-06-17 MED ORDER — KCL IN DEXTROSE-NACL 20-5-0.45 MEQ/L-%-% IV SOLN
INTRAVENOUS | Status: AC
  Filled 2011-06-17: qty 1000

## 2011-06-17 MED ORDER — OXYCODONE HCL 60 MG PO TB12: 1.0000 | ORAL_TABLET | Freq: Two times a day (BID) | ORAL | Status: DC

## 2011-06-17 MED ORDER — ACETAMINOPHEN 10 MG/ML IV SOLN
INTRAVENOUS | Status: AC
  Filled 2011-06-17: qty 100

## 2011-06-17 MED ORDER — ACETAMINOPHEN 10 MG/ML IV SOLN
INTRAVENOUS | Status: DC | PRN
  Administered 2011-06-17: 1000 mg via INTRAVENOUS

## 2011-06-17 MED ORDER — FENTANYL CITRATE 0.05 MG/ML IJ SOLN
INTRAMUSCULAR | Status: DC | PRN
  Administered 2011-06-17: 50 ug via INTRAVENOUS
  Administered 2011-06-17 (×2): 100 ug via INTRAVENOUS
  Administered 2011-06-17: 150 ug via INTRAVENOUS
  Administered 2011-06-17 (×2): 50 ug via INTRAVENOUS

## 2011-06-17 MED ORDER — KETOROLAC TROMETHAMINE 30 MG/ML IJ SOLN
INTRAMUSCULAR | Status: DC | PRN
  Administered 2011-06-17: 30 mg via INTRAVENOUS

## 2011-06-17 MED ORDER — BUPIVACAINE-EPINEPHRINE PF 0.5-1:200000 % IJ SOLN
INTRAMUSCULAR | Status: DC | PRN
  Administered 2011-06-17: 30 mL

## 2011-06-17 MED ORDER — OXYCODONE HCL 40 MG PO TB12
60.0000 mg | ORAL_TABLET | Freq: Two times a day (BID) | ORAL | Status: DC
  Administered 2011-06-17 – 2011-06-18 (×3): 60 mg via ORAL
  Filled 2011-06-17 (×4): qty 1

## 2011-06-17 MED ORDER — WARFARIN - PHARMACIST DOSING INPATIENT
Freq: Every day | Status: DC
  Administered 2011-06-18: 18:00:00

## 2011-06-17 MED ORDER — NALOXONE HCL 0.4 MG/ML IJ SOLN: 0.4000 mg | INTRAMUSCULAR | Status: DC | PRN

## 2011-06-17 MED ORDER — ROCURONIUM BROMIDE 100 MG/10ML IV SOLN
INTRAVENOUS | Status: DC | PRN
  Administered 2011-06-17: 35 mg via INTRAVENOUS
  Administered 2011-06-17: 5 mg via INTRAVENOUS
  Administered 2011-06-17: 10 mg via INTRAVENOUS

## 2011-06-17 MED ORDER — CEFAZOLIN SODIUM-DEXTROSE 2-3 GM-% IV SOLR
2.0000 g | Freq: Four times a day (QID) | INTRAVENOUS | Status: AC
  Administered 2011-06-17 (×2): 2 g via INTRAVENOUS
  Filled 2011-06-17 (×2): qty 50

## 2011-06-17 MED ORDER — LIDOCAINE HCL (CARDIAC) 20 MG/ML IV SOLN
INTRAVENOUS | Status: DC | PRN
  Administered 2011-06-17: 100 mg via INTRAVENOUS

## 2011-06-17 MED ORDER — COUMADIN BOOK
Freq: Once | Status: AC
  Administered 2011-06-17: 16:00:00
  Filled 2011-06-17: qty 1

## 2011-06-17 MED ORDER — MIDAZOLAM HCL 5 MG/5ML IJ SOLN
INTRAMUSCULAR | Status: DC | PRN
  Administered 2011-06-17 (×2): 1 mg via INTRAVENOUS

## 2011-06-17 MED ORDER — VENLAFAXINE HCL 75 MG PO TABS: 225.0000 mg | ORAL_TABLET | ORAL | Status: DC

## 2011-06-17 MED ORDER — MORPHINE SULFATE 10 MG/ML IJ SOLN
INTRAMUSCULAR | Status: DC | PRN
  Administered 2011-06-17 (×5): 2 mg via INTRAVENOUS

## 2011-06-17 MED ORDER — LACTATED RINGERS IV SOLN
INTRAVENOUS | Status: DC | PRN
  Administered 2011-06-17 (×2): via INTRAVENOUS

## 2011-06-17 MED ORDER — CEFUROXIME SODIUM 1.5 G IJ SOLR
INTRAMUSCULAR | Status: DC | PRN
  Administered 2011-06-17: 1.5 g

## 2011-06-17 SURGICAL SUPPLY — 76 items
BANDAGE ELASTIC 4 VELCRO ST LF (GAUZE/BANDAGES/DRESSINGS) ×2 IMPLANT
BANDAGE ESMARK 6X9 LF (GAUZE/BANDAGES/DRESSINGS) ×1 IMPLANT
BENZOIN TINCTURE PRP APPL 2/3 (GAUZE/BANDAGES/DRESSINGS) ×2 IMPLANT
BLADE SAGITTAL 25.0X1.19X90 (BLADE) ×2 IMPLANT
BLADE SAW SAG 90X13X1.27 (BLADE) ×2 IMPLANT
BNDG ELASTIC 6X10 VLCR STRL LF (GAUZE/BANDAGES/DRESSINGS) ×2 IMPLANT
BNDG ESMARK 6X9 LF (GAUZE/BANDAGES/DRESSINGS) ×2
BOWL SMART MIX CTS (DISPOSABLE) ×2 IMPLANT
CEMENT HV SMART SET (Cement) ×4 IMPLANT
CLOTH BEACON ORANGE TIMEOUT ST (SAFETY) ×2 IMPLANT
COVER BACK TABLE 24X17X13 BIG (DRAPES) IMPLANT
COVER SURGICAL LIGHT HANDLE (MISCELLANEOUS) ×2 IMPLANT
CUFF TOURNIQUET SINGLE 34IN LL (TOURNIQUET CUFF) ×2 IMPLANT
CUFF TOURNIQUET SINGLE 44IN (TOURNIQUET CUFF) IMPLANT
DRAPE EXTREMITY T 121X128X90 (DRAPE) ×2 IMPLANT
DRAPE U-SHAPE 47X51 STRL (DRAPES) ×2 IMPLANT
DRSG PAD ABDOMINAL 8X10 ST (GAUZE/BANDAGES/DRESSINGS) ×4 IMPLANT
DURAPREP 26ML APPLICATOR (WOUND CARE) ×4 IMPLANT
DURAPREP 6ML APPLICATOR 50/CS (WOUND CARE) ×2 IMPLANT
ELECT REM PT RETURN 9FT ADLT (ELECTROSURGICAL) ×2
ELECTRODE REM PT RTRN 9FT ADLT (ELECTROSURGICAL) ×1 IMPLANT
EVACUATOR 1/8 PVC DRAIN (DRAIN) ×2 IMPLANT
FACESHIELD LNG OPTICON STERILE (SAFETY) ×2 IMPLANT
GAUZE XEROFORM 5X9 LF (GAUZE/BANDAGES/DRESSINGS) ×2 IMPLANT
GLOVE BIO SURGEON STRL SZ8.5 (GLOVE) ×2 IMPLANT
GLOVE BIOGEL PI IND STRL 6.5 (GLOVE) ×1 IMPLANT
GLOVE BIOGEL PI IND STRL 7.0 (GLOVE) ×1 IMPLANT
GLOVE BIOGEL PI IND STRL 8 (GLOVE) ×2 IMPLANT
GLOVE BIOGEL PI INDICATOR 6.5 (GLOVE) ×1
GLOVE BIOGEL PI INDICATOR 7.0 (GLOVE) ×1
GLOVE BIOGEL PI INDICATOR 8 (GLOVE) ×2
GLOVE ECLIPSE 6.5 STRL STRAW (GLOVE) ×2 IMPLANT
GLOVE ECLIPSE 7.5 STRL STRAW (GLOVE) ×4 IMPLANT
GLOVE SURG SS PI 6.5 STRL IVOR (GLOVE) ×2 IMPLANT
GLOVE SURG SS PI 8.5 STRL IVOR (GLOVE) ×1
GLOVE SURG SS PI 8.5 STRL STRW (GLOVE) ×1 IMPLANT
GOWN PREVENTION PLUS XLARGE (GOWN DISPOSABLE) IMPLANT
GOWN SRG XL XLNG 56XLVL 4 (GOWN DISPOSABLE) ×2 IMPLANT
GOWN STRL NON-REIN LRG LVL3 (GOWN DISPOSABLE) ×4 IMPLANT
GOWN STRL NON-REIN XL XLG LVL4 (GOWN DISPOSABLE) ×2
HANDPIECE INTERPULSE COAX TIP (DISPOSABLE) ×1
HOOD PEEL AWAY FACE SHEILD DIS (HOOD) ×6 IMPLANT
IMMOBILIZER KNEE 20 (SOFTGOODS) ×2
IMMOBILIZER KNEE 20 THIGH 36 (SOFTGOODS) ×1 IMPLANT
IMMOBILIZER KNEE 22 UNIV (SOFTGOODS) IMPLANT
IMMOBILIZER KNEE 24 THIGH 36 (MISCELLANEOUS) IMPLANT
IMMOBILIZER KNEE 24 UNIV (MISCELLANEOUS)
KIT BASIN OR (CUSTOM PROCEDURE TRAY) ×2 IMPLANT
KIT ROOM TURNOVER OR (KITS) ×2 IMPLANT
MANIFOLD NEPTUNE II (INSTRUMENTS) ×2 IMPLANT
MARKER SPHERE PSV REFLC THRD 5 (MARKER) ×6 IMPLANT
NEEDLE 18GX1X1/2 (RX/OR ONLY) (NEEDLE) ×2 IMPLANT
NEEDLE HYPO 25GX1X1/2 BEV (NEEDLE) ×2 IMPLANT
NS IRRIG 1000ML POUR BTL (IV SOLUTION) IMPLANT
PACK TOTAL JOINT (CUSTOM PROCEDURE TRAY) ×2 IMPLANT
PAD ARMBOARD 7.5X6 YLW CONV (MISCELLANEOUS) ×2 IMPLANT
PAD CAST 4YDX4 CTTN HI CHSV (CAST SUPPLIES) ×1 IMPLANT
PADDING CAST COTTON 4X4 STRL (CAST SUPPLIES) ×1
PADDING CAST COTTON 6X4 STRL (CAST SUPPLIES) ×2 IMPLANT
PIN SCHANZ 4MM 130MM (PIN) ×8 IMPLANT
SET HNDPC FAN SPRY TIP SCT (DISPOSABLE) ×1 IMPLANT
SPONGE GAUZE 4X4 12PLY (GAUZE/BANDAGES/DRESSINGS) ×4 IMPLANT
STAPLER VISISTAT 35W (STAPLE) ×4 IMPLANT
STRIP CLOSURE SKIN 1/2X4 (GAUZE/BANDAGES/DRESSINGS) ×2 IMPLANT
SUCTION FRAZIER TIP 10 FR DISP (SUCTIONS) ×2 IMPLANT
SUT MON AB 3-0 SH 27 (SUTURE)
SUT MON AB 3-0 SH27 (SUTURE) IMPLANT
SUT VIC AB 0 CTB1 27 (SUTURE) ×4 IMPLANT
SUT VIC AB 1 CT1 27 (SUTURE) ×4
SUT VIC AB 1 CT1 27XBRD ANBCTR (SUTURE) ×4 IMPLANT
SUT VIC AB 2-0 CTB1 (SUTURE) ×4 IMPLANT
SYR CONTROL 10ML LL (SYRINGE) ×4 IMPLANT
TOWEL OR 17X24 6PK STRL BLUE (TOWEL DISPOSABLE) ×2 IMPLANT
TOWEL OR 17X26 10 PK STRL BLUE (TOWEL DISPOSABLE) ×2 IMPLANT
TRAY FOLEY CATH 14FR (SET/KITS/TRAYS/PACK) ×2 IMPLANT
WATER STERILE IRR 1000ML POUR (IV SOLUTION) ×2 IMPLANT

## 2011-06-17 NOTE — Anesthesia Postprocedure Evaluation (Signed)
Anesthesia Post Note  Patient: Carla Leonard  Procedure(s) Performed: Procedure(s) (LRB): COMPUTER ASSISTED TOTAL KNEE ARTHROPLASTY (Left)  Anesthesia type: general  Patient location: PACU  Post pain: Pain level controlled  Post assessment: Patient's Cardiovascular Status Stable  Last Vitals:  Filed Vitals:   06/17/11 1130  BP: 108/50  Pulse: 66  Temp:   Resp: 9    Post vital signs: Reviewed and stable  Level of consciousness: sedated  Complications: No apparent anesthesia complications

## 2011-06-17 NOTE — Progress Notes (Signed)
Orthopedic Tech Progress Note Patient Details:  Carla Leonard 1955/08/19 [id number]  CPM Left Knee CPM Left Knee: On Left Knee Flexion (Degrees): 60  Left Knee Extension (Degrees): 0    Havish Petties T 06/17/2011, 11:30 AM

## 2011-06-17 NOTE — Anesthesia Preprocedure Evaluation (Signed)
Anesthesia Evaluation  Patient identified by MRN, date of birth, ID band Patient awake    Reviewed: Allergy & Precautions, H&P , NPO status , Patient's Chart, lab work & pertinent test results  Airway Mallampati: I TM Distance: >3 FB Neck ROM: Full    Dental   Pulmonary          Cardiovascular hypertension, Pt. on medications     Neuro/Psych    GI/Hepatic   Endo/Other    Renal/GU      Musculoskeletal   Abdominal   Peds  Hematology   Anesthesia Other Findings   Reproductive/Obstetrics                           Anesthesia Physical Anesthesia Plan  ASA: III  Anesthesia Plan: General   Post-op Pain Management: MAC Combined w/ Regional for Post-op pain   Induction: Intravenous  Airway Management Planned: Oral ETT  Additional Equipment:   Intra-op Plan:   Post-operative Plan: Extubation in OR  Informed Consent: I have reviewed the patients History and Physical, chart, labs and discussed the procedure including the risks, benefits and alternatives for the proposed anesthesia with the patient or authorized representative who has indicated his/her understanding and acceptance.     Plan Discussed with: CRNA and Surgeon  Anesthesia Plan Comments:         Anesthesia Quick Evaluation

## 2011-06-17 NOTE — Anesthesia Procedure Notes (Signed)
Anesthesia Regional Block:  Femoral nerve block  Pre-Anesthetic Checklist: ,, timeout performed, Correct Patient, Correct Site, Correct Laterality, Correct Procedure,, site marked, risks and benefits discussed, Surgical consent,  Pre-op evaluation,  At surgeon's request and post-op pain management  Laterality: Left  Prep: chloraprep       Needles:  Injection technique: Single-shot  Needle Type: Echogenic Stimulator Needle     Needle Length: 9cm  Needle Gauge: 21    Additional Needles:  Procedures: nerve stimulator Femoral nerve block  Nerve Stimulator or Paresthesia:  Response: Quadriceps muscle contraction, 0.45 mA,   Additional Responses:   Narrative:  Start time: 06/17/2011 7:02 AM End time: 06/17/2011 7:22 AM Injection made incrementally with aspirations every 5 mL.  Performed by: Personally  Anesthesiologist: Dr Chaney Malling  Additional Notes: Functioning IV was confirmed and monitors were applied.  A 90mm 21ga Arrow echogenic stimulator needle was used. Sterile prep and drape,hand hygiene and sterile gloves were used.  Negative aspiration and negative test dose prior to incremental administration of local anesthetic. The patient tolerated the procedure well.    Femoral nerve block

## 2011-06-17 NOTE — Preoperative (Signed)
Beta Blockers   Reason not to administer Beta Blockers:Not Applicable, took Toprol at 2am

## 2011-06-17 NOTE — Op Note (Signed)
NAME:  Carla, Leonard NO.:  [id number]  MEDICAL RECORD NO.:  [id number]  LOCATION:  [facility]                         FACILITY:  [facility]  PHYSICIAN:  Harvie Junior, M.D.   DATE OF BIRTH:  Oct 15, 1955  DATE OF PROCEDURE:  06/17/2011 DATE OF DISCHARGE:                              OPERATIVE REPORT   PREOPERATIVE DIAGNOSES: 1. End-stage degenerative joint disease, left knee. 2. Ganglion cyst, right volar wrist.  POSTOPERATIVE DIAGNOSES: 1. End-stage degenerative joint disease, left knee. 2. Ganglion cyst, right volar wrist.  PROCEDURE: 1. Left total knee replacement with a Sigma system, size 3 femur, size     3 MBT revision tray, size 12.5 mm bridging bearing, and a 35 mm all     polyethylene patella. 2. Computer-assisted left total knee replacement. 3. Aspiration and injection of left volar ganglion cyst.  SURGEON:  Harvie Junior, MD  ASSISTANT:  Marshia Ly, PA  ANESTHESIA:  General.  BRIEF HISTORY:  Carla Leonard is a 56 year old female with a long history of having significant complaints of left knee pain.  She has had a right total knee replacement months ago and had done great with that.  Because of failure of all conservative care, and night pain, and activity- related pain and bone-on-bone changes on x-ray, she is taken to the operating room for left total knee replacement.  Because of her large size and young age, we felt that computer assistance was critical.  This was chosen to be used preoperatively.  The patient was brought to the operating room for this procedure.  The patient also had a volar ganglion cyst on the right side and we had discussed aspiration and injection of the volar ganglion cyst while she was under anesthesia just to minimize the trauma associated with that procedure.  This was to be accomplished as well.  PROCEDURE:  The patient was taken to the operative room and after adequate anesthesia was obtained with general  anesthetic, the patient was placed supine on the operating table.  The left leg was then prepped and draped in usual sterile fashion.  Following this, the leg was exsanguinated.  Blood pressure tourniquet inflated to 350 mmHg. Following this, incision was made, subcutaneous tissue down to the level of extensor mechanism and medial parapatellar arthrotomy was undertaken. Following this, the medial and lateral meniscus was removed, anterior and posterior cruciates retropatellar fat pad, and following this, the attention was turned towards placement of the computer 2 pins in the tibia, 2 pins in the femur, and arrays were placed.  Registration process undertaken.  This adds 30 minutes of surgical procedure.  Once this was accomplished, the tibia was cut perpendicular to its long axis. The femur was cut perpendicular to the anatomic axis.  Spacer block was put in place.  Perfect neutral long alignment gap balance.  Attention was turned to the femur, which was sized to a 3.  Block was put in place.  Anterior and posterior cuts were made chamfers and box and then the posterior bone off the femur was removed with an osteotome and rongeur.  Once this was done, attention was turned to the tibia which was sized to a 3,  it was drilled and keeled and drilled for MBT revision tray because of her large size and young age.  Once this was accomplished, the rotational alignment was set and then the trial components were put in place.  Size 3 MBT revision tray, tibia size 3 femur.  Attention was turned to the patella, cut down to 13 mm, sized to a 35, and the lugs were drilled for the femur, and then the lugs drilled into this patella paddle.  Once this was done, all trial components were removed.  The knee was copiously and thoroughly lavaged and suctioned dry.  The final components were then cemented into place.  Size 3 MBT revision tibia, size 3 femur, a 12.5 mm trial was placed poly, and then attention  was turned to the 35 mm all poly button, which was placed and held with a clamp.  Cement was allowed to harden.  All excess bone cement was removed.  Once the bone cement allowed to harden, the tourniquet was let down.  All bleeding was controlled with electrocautery.  The trial poly was then removed, the final poly was then placed.  The knee was then checked for stability and range of motion, all was excellent.  Computer modules were then removed at this point.  The medium Hemovac drain was placed.  The medial parapatellar arthrotomy was closed with 1 Vicryl running, skin with 0 and 2-0 Vicryl, and skin staples were then applied.  Sterile compressive dressing was applied and attention was then turned to the right wrist.  A 5 mL syringe with an 18-gauge needle was used to puncture the right wrist ganglion cyst, and once this had been punctured and aspirated, 1 mL of 40 mg/mL Depo-Medrol was used with 1 mL of Marcaine to inject into this area to try to keep this from filling.  At this point, sterile compressive dressing was applied on the wrist.  The patient was then taken to the recovery room and she was noted to be in satisfactory condition.  Estimated blood loss for the knee was probably 200 mL.     Harvie Junior, M.D.     Ranae Plumber  D:  06/17/2011  T:  06/17/2011  Job:  [id number]

## 2011-06-17 NOTE — Progress Notes (Signed)
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 Sulfa Antibiotics Anaphylaxis  . Nsaids Other (See Comments)    Stomach pain, diarrhea    Patient Measurements: Height: 5\' 1"  (154.9 cm) Weight: 286 lb (129.729 kg) IBW/kg (Calculated) : 47.8   Vital Signs: Temp: 97.6 F (36.4 C) (06/14 1245) Temp src: Oral (06/14 1245) BP: 121/75 mmHg (06/14 1245) Pulse Rate: 67  (06/14 1245)  Labs:  Basename 06/17/11 0626  HGB --  HCT --  PLT --  APTT 45*  LABPROT 13.7  INR 1.03  HEPARINUNFRC --  CREATININE 0.80  CKTOTAL --  CKMB --  TROPONINI --    Estimated Creatinine Clearance: 99.9 ml/min (by C-G formula based on Cr of 0.8).   Medical History: Past Medical History  Diagnosis Date  . Hypertension   . Depression   . Arthritis     Assessment: 56 y.o. 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 Baseline INR 1.03. Pre-op Hgb~14.1. Warfarin points~4  Goal of Therapy:  INR 2-3 Monitor platelets by anticoagulation protocol: Yes   Plan:  1. Warfarin 5 mg x1 dose at 1800 2. Warfarin book/video 3. Daily PT/INR 4. Will continue to monitor for any signs/symptoms of bleeding and will follow up with PT/INR in the a.m.   Georgina Pillion, PharmD, BCPS Clinical Pharmacist Pager: [phone] 06/17/2011 2:00 PM

## 2011-06-17 NOTE — Progress Notes (Signed)
CARE MANAGEMENT NOTE 06/17/2011  Patient:  IMAGINE, NEST   Account Number:  [id number]  Date Initiated:  06/17/2011  Documentation initiated by:  Vance Peper  Subjective/Objective Assessment:   56 yr old female s/p left total knee arthroplasty and aspiration and injection of left volar ganglion cyst.     Action/Plan:   Patient preoperatively setup with [facility].Fresh postop-CM  Will follow.   Anticipated DC Date:     Anticipated DC Plan:  HOME W HOME HEALTH SERVICES      DC Planning Services  CM consult      Choice offered to / List presented to:             Status of service:  In process, will continue to follow

## 2011-06-17 NOTE — Transfer of Care (Signed)
Immediate Anesthesia Transfer of Care Note  Patient: Carla Leonard  Procedure(s) Performed: Procedure(s) (LRB): COMPUTER ASSISTED TOTAL KNEE ARTHROPLASTY (Left)  Patient Location: PACU  Anesthesia Type: General  Level of Consciousness: awake, oriented, patient cooperative and responds to stimulation  Airway & Oxygen Therapy: Patient Spontanous Breathing and Patient connected to nasal cannula oxygen  Post-op Assessment: Report given to PACU RN and Post -op Vital signs reviewed and stable  Post vital signs: Reviewed and stable  Complications: No apparent anesthesia complications

## 2011-06-17 NOTE — Brief Op Note (Addendum)
06/17/2011  9:31 AM  PATIENT:  Carla Leonard  56 y.o. female  PRE-OPERATIVE DIAGNOSIS: 1. DEGENERATIVE JOINT DISEASE Left Knee.  2. Volar radial ganglion cyst right wrist POST-OPERATIVE DIAGNOSIS:Same  PROCEDURE:  Procedure(s) (LRB): COMPUTER ASSISTED TOTAL KNEE ARTHROPLASTY (Left) Aspiration/ injection of ganglion cyst right wrist  SURGEON:  Surgeon(s) and Role:    * Harvie Junior, MD - Primary  PHYSICIAN ASSISTANT:   ASSISTANTS: Otelia Sergeant, Brown PA-S  ANESTHESIA:   general  EBL:  Total I/O In: 1500 [I.V.:1500] Out: 100 [Urine:100]  BLOOD ADMINISTERED:none  DRAINS: (1) Hemovact drain(s) in the l. knee with  Suction Open   LOCAL MEDICATIONS USED:  MARCAINE     SPECIMEN:  No Specimen  DISPOSITION OF SPECIMEN:  N/A  COUNTS:  YES  TOURNIQUET:   Total Tourniquet Time Documented: Thigh (Left) - 78 minutes  DICTATION: .Other Dictation: Dictation Number [id number]  PLAN OF CARE: Admit to inpatient   PATIENT DISPOSITION:  PACU - hemodynamically stable.   Delay start of Pharmacological VTE agent (>24hrs) due to surgical blood loss or risk of bleeding: no

## 2011-06-17 NOTE — OR Nursing (Signed)
With the foley insertion, the perineal tissues got irritated and began bleeding.  MD present and aware.

## 2011-06-17 NOTE — Progress Notes (Signed)
UR COMPLETED  

## 2011-06-17 NOTE — H&P (Signed)
PREOPERATIVE H&P  Chief Complaint: l. Knee pain  HPI: Carla Leonard is a 56 y.o. female who presents for evaluation of l. Knee pain. It has been present for greater than 1 year and has been worsening. She has bone on bone changes and has done well with r. Tkr.   She has failed conservative measures. Pain is rated as severe.  Past Medical History  Diagnosis Date  . Hypertension   . Depression   . Arthritis    Past Surgical History  Procedure Date  . Joint replacement     right TKA 06/2010   History   Social History  . Marital Status: Legally Separated    Spouse Name: N/A    Number of Children: N/A  . Years of Education: N/A   Social History Main Topics  . Smoking status: None  . Smokeless tobacco: None  . Alcohol Use:   . Drug Use:   . Sexually Active:    Other Topics Concern  . None   Social History Narrative  . None   No family history on file. Allergies  Allergen Reactions  . Sulfa Antibiotics Anaphylaxis  . Nsaids Other (See Comments)    Stomach pain, diarrhea   Prior to Admission medications   Medication Sig Start Date End Date Taking? Authorizing Provider  ARIPiprazole (ABILIFY) 5 MG tablet Take 5 mg by mouth daily.   Yes Historical Provider, MD  metoprolol succinate (TOPROL-XL) 25 MG 24 hr tablet Take 25 mg by mouth daily.   Yes Historical Provider, MD  oxycodone (ROXICODONE) 30 MG immediate release tablet Take 30 mg by mouth every 4 (four) hours as needed. For pain   Yes Historical Provider, MD  Oxycodone HCl (OXYCONTIN) 60 MG TB12 Take 1 tablet by mouth 2 (two) times daily.   Yes Historical Provider, MD  venlafaxine (EFFEXOR) 75 MG tablet Take 225 mg by mouth every morning.   Yes Historical Provider, MD     Positive ROS: none  All other systems have been reviewed and were otherwise negative with the exception of those mentioned in the HPI and as above.  Physical Exam: Filed Vitals:   06/17/11 0608  BP: 121/66  Pulse: 66  Temp: 98.8 F (37.1  C)  Resp: 18    General: Alert, no acute distress Cardiovascular: No pedal edema Respiratory: No cyanosis, no use of accessory musculature GI: No organomegaly, abdomen is soft and non-tender Skin: No lesions in the area of chief complaint Neurologic: Sensation intact distally Psychiatric: Patient is competent for consent with normal mood and affect Lymphatic: No axillary or cervical lymphadenopathy  MUSCULOSKELETAL: l. Knee Painful rom// med jt line tender// rom 5-100 degrees//grinding through rom  Assessment/Plan: DEGENERATIVE JOINT DISEASE Plan for Procedure(s): COMPUTER ASSISTED TOTAL KNEE ARTHROPLASTY  The risks benefits and alternatives were discussed with the patient including but not limited to the risks of nonoperative treatment, versus surgical intervention including infection, bleeding, nerve injury, malunion, nonunion, hardware prominence, hardware failure, need for hardware removal, blood clots, cardiopulmonary complications, morbidity, mortality, among others, and they were willing to proceed.  Predicted outcome is good, although there will be at least a six to nine month expected recovery.  Dyan Labarbera L, MD 06/17/2011 7:24 AM

## 2011-06-17 NOTE — Discharge Instructions (Signed)
Total Knee Replacement  Care After  Refer to this sheet in the next few weeks. These discharge instructions provide you with general information on caring for yourself after you leave the hospital. Your caregiver may also give you specific instructions. Your treatment has been planned according to the most current medical practices available, but unavoidable complications sometimes occur. If you have any problems or questions after discharge, please call your caregiver. Regaining a near full range of motion of your knee within the first 3 to 6 weeks after surgery is critical.  HOME CARE INSTRUCTIONS    You may resume a normal diet and activities as directed. Perform exercises as directed.   You will receive physical therapy as directed by your caregiver.   Take showers instead of baths until informed otherwise.   Change bandages (dressings) if necessary or as directed.   Only take over-the-counter or prescription medicines for pain, discomfort, or fever as directed by your caregiver.   Eat a well-balanced diet.   Avoid lifting or driving until you are instructed otherwise.   Make an appointment to see your caregiver for stitches (suture) or staple removal as directed.   If you have been sent home with a continuous passive motion machine (CPM machine), use as directed.  SEEK MEDICAL CARE IF:   You have swelling of your calf or leg.   You develop shortness of breath or chest pain.   You have redness, swelling, or increasing pain in the wound.   There is pus or any unusual drainage coming from the surgical site.   You notice a bad smell coming from the surgical site or dressing.   The surgical site breaks open after sutures or staples have been removed.   There is persistent bleeding from the suture or staple line.   You are getting worse or are not improving.   You have any other questions or concerns.  SEEK IMMEDIATE MEDICAL CARE IF:    You have a fever.   You develop a rash.   You have  difficulty breathing.   You develop any reaction or side effects to medicines given.   Your knee motion is decreasing rather than improving.  MAKE SURE YOU:    Understand these instructions.   Will watch your condition.   Will get help right away if you are not doing well or get worse.  Document Released: 07/09/2004 Document Revised: 12/09/2010 Document Reviewed: 10/22/2008  ExitCare Patient Information 2012 [employer]

## 2011-06-18 LAB — CBC
Hemoglobin: 10.3 g/dL — ABNORMAL LOW (ref 12.0–15.0)
Platelets: 228 10*3/uL (ref 150–400)
RBC: 3.36 MIL/uL — ABNORMAL LOW (ref 3.87–5.11)
WBC: 9.3 10*3/uL (ref 4.0–10.5)

## 2011-06-18 LAB — BASIC METABOLIC PANEL
CO2: 31 mEq/L (ref 19–32)
Glucose, Bld: 122 mg/dL — ABNORMAL HIGH (ref 70–99)
Potassium: 4.5 mEq/L (ref 3.5–5.1)
Sodium: 138 mEq/L (ref 135–145)

## 2011-06-18 LAB — PROTIME-INR
INR: 1.26 (ref 0.00–1.49)
Prothrombin Time: 16.1 seconds — ABNORMAL HIGH (ref 11.6–15.2)

## 2011-06-18 MED ORDER — OXYCODONE HCL 20 MG PO TB12
60.0000 mg | ORAL_TABLET | Freq: Two times a day (BID) | ORAL | Status: DC
  Administered 2011-06-19 (×2): 60 mg via ORAL
  Filled 2011-06-18 (×2): qty 3

## 2011-06-18 MED ORDER — WARFARIN SODIUM 7.5 MG PO TABS
7.5000 mg | ORAL_TABLET | Freq: Once | ORAL | Status: AC
  Administered 2011-06-18: 7.5 mg via ORAL
  Filled 2011-06-18: qty 1

## 2011-06-18 NOTE — Progress Notes (Signed)
Patient comfortable, has been up with PT.  BP 105/56  Pulse 66  Temp 97.8 F (36.6 C) (Oral)  Resp 18  Ht 5\' 1"  (1.549 m)  Wt 129.729 kg (286 lb)  BMI 54.04 kg/m2  SpO2 100%  NVI Dressing CDI  Hg 10.3 INR 1.26  POD #1 after left TKA, doing well  - OOB today - d/c PCA tomorrow - continue coumadin and SCDs

## 2011-06-18 NOTE — Progress Notes (Addendum)
ANTICOAGULATION CONSULT NOTE - Follow Up Consult  Pharmacy Consult for Coumadin Indication: VTE prophylaxis s/p L TKA on 6/14    Allergies  Allergen Reactions  . Sulfa Antibiotics Anaphylaxis  . Nsaids Other (See Comments)    Stomach pain, diarrhea    Patient Measurements: Height: 5\' 1"  (154.9 cm) Weight: 286 lb (129.729 kg) IBW/kg (Calculated) : 47.8    Vital Signs: Temp: 97.8 F (36.6 C) (06/15 0602) Temp src: Oral (06/15 0602) BP: 111/56 mmHg (06/15 0947) Pulse Rate: 65  (06/15 0947)  Labs:  Basename 06/18/11 0650 06/17/11 0626  HGB 10.3* --  HCT 32.8* --  PLT 228 --  APTT -- 45*  LABPROT 16.1* 13.7  INR 1.26 1.03  HEPARINUNFRC -- --  CREATININE 0.90 0.80  CKTOTAL -- --  CKMB -- --  TROPONINI -- --    Estimated Creatinine Clearance: 88.8 ml/min (by C-G formula based on Cr of 0.9).    Assessment: 56 y.o. 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 Baseline INR 1.03. Pre-op Hgb~14.1. Warfarin points~4 todays INR 1.26 after 5 mg dose Goal of Therapy:  INR 2.0 Monitor platelets by anticoagulation protocol: Yes   Plan:  1. Warfarin 7.5 mg at 1800 2. Daily PT/INR 3.Will continue to monitor for any signs/symptoms of bleeding  4. Will educate pt  Lucille Passy 06/18/2011,12:46 PM

## 2011-06-18 NOTE — Progress Notes (Addendum)
Physical Therapy Note   06/18/11 1551  PT Visit Information  Last PT Received On 06/18/11  Assistance Needed +1  PT Time Calculation  PT Start Time 1516  PT Stop Time 1547  PT Time Calculation (min) 31 min  Subjective Data  Subjective "I'm wearing myself out"  after exercise and gait  Precautions  Precautions Knee  Precaution Comments Reviewed precautions and knee exercises from handout  Required Braces or Orthoses Knee Immobilizer - Left  Knee Immobilizer - Left On except when in CPM  Restrictions  Weight Bearing Restrictions Yes  LLE Weight Bearing WBAT  Cognition  Overall Cognitive Status Appears within functional limits for tasks assessed/performed  Arousal/Alertness Awake/alert  Orientation Level Oriented X4 / Intact  Behavior During Session [facility] for tasks performed  Transfers  Transfers Sit to Stand;Stand to Sit  Sit to Stand 4: Min guard;From chair/3-in-1;With armrests;With upper extremity assist  Stand to Sit 4: Min guard;With upper extremity assist;With armrests;To chair/3-in-1  Details for Transfer Assistance Verbal cues for safety, hand placement, and to slowly descend into chair.  Ambulation/Gait  Ambulation/Gait Assistance 4: Min guard  Ambulation Distance (Feet) 104 Feet  Assistive device Rolling walker  Ambulation/Gait Assistance Details Cues to stand tall and for safe technique  Gait Pattern Step-through pattern;Antalgic;Wide base of support;Trunk flexed  Gait velocity Slow gait speed  General Gait Details Cues to stand upright and look forward during gait.  Exercises  Exercises Total Joint  Total Joint Exercises  Ankle Circles/Pumps AROM;Both;10 reps;Seated  Quad Sets AROM;Left;10 reps;Seated  Towel Squeeze AROM;Both;10 reps;Seated  Short Arc Quad AROM;Left;10 reps;Seated  Heel Slides AROM;Left;10 reps;Seated  Hip ABduction/ADduction AROM;Left;10 reps;Seated  Straight Leg Raises AROM;Left;10 reps;Seated  Long Arc Quad AROM;Left;10 reps;Seated  Knee  Flexion AROM;Left;10 reps;Seated  Goniometric ROM -11 ext; 64 flex AROM  PT - End of Session  Equipment Utilized During Treatment Gait belt;Left knee immobilizer  Activity Tolerance Patient tolerated treatment well (Patient off PCA, so pain increased from am session.  )  Patient left in chair;with call bell/phone within reach  Nurse Communication Mobility status;Patient requests pain meds  PT - Assessment/Plan  Comments on Treatment Session Patient did very well with mobility and exercises this pm.  Good progress.  PT Plan Discharge plan needs to be updated;Frequency remains appropriate  PT Frequency 7X/week  Follow Up Recommendations Home health PT;Supervision/Assistance - 24 hour  Equipment Recommended None recommended by PT  Acute Rehab PT Goals  PT Goal: Sit to Stand - Progress Progressing toward goal  PT Goal: Stand to Sit - Progress Progressing toward goal  PT Goal: Ambulate - Progress Progressing toward goal  PT Goal: Perform Home Exercise Program - Progress Progressing toward goal  PT General Charges  $$ ACUTE PT VISIT 1 Procedure  PT Treatments  $Gait Training 8-22 mins  $Therapeutic Exercise 8-22 mins   [name]. [name], [facility] Acute Rehab Services Pager [phone]

## 2011-06-18 NOTE — Evaluation (Signed)
Physical Therapy Evaluation Patient Details Name: Carla Leonard MRN: [id number] DOB: 06/12/1955 Today's Date: 06/18/2011 Time: 4098-1191 PT Time Calculation (min): 29 min  PT Assessment / Plan / Recommendation Clinical Impression  Patient is a 56 yo female s/p lt. TKA.  Patient did well with mobility this am.  Anticipate she will progress well with therapy.  Will follow for functional mobility training, exercises for knee, and education.  Patient would benefit from OP PT at discharge.    PT Assessment  Patient needs continued PT services    Follow Up Recommendations  Outpatient PT;Supervision/Assistance - 24 hour    Barriers to Discharge None (Is going to friend's home who will provide 24 hour assist)      lEquipment Recommendations  None recommended by PT    Recommendations for Other Services     Frequency 7X/week    Precautions / Restrictions Precautions Precautions: Knee Precaution Booklet Issued: Yes (comment) Required Braces or Orthoses: Knee Immobilizer - Left Knee Immobilizer - Left: On except when in CPM Restrictions Weight Bearing Restrictions: Yes LLE Weight Bearing: Weight bearing as tolerated       Mobility  Bed Mobility Bed Mobility: Supine to Sit;Sitting - Scoot to Edge of Bed;Sit to Supine Supine to Sit: 6: Modified independent (Device/Increase time);With rails Sitting - Scoot to Edge of Bed: 6: Modified independent (Device/Increase time);With rail Sit to Supine: 6: Modified independent (Device/Increase time) Details for Bed Mobility Assistance: Patient sitting EOB without KI as PT entered room.  Returned to supine and applied KI LLE.  Verbal cues for proper technique for transitions. Transfers Transfers: Sit to Stand;Stand to Sit Sit to Stand: 4: Min assist;With upper extremity assist;From bed;From toilet Stand to Sit: 4: Min assist;With upper extremity assist;With armrests;To toilet;To chair/3-in-1 Details for Transfer Assistance: Verbal cues for hand  placement and placement of LLE when sitting down.  Cues for safety. Ambulation/Gait Ambulation/Gait Assistance: 4: Min assist Ambulation Distance (Feet): 48 Feet Assistive device: Rolling walker Ambulation/Gait Assistance Details: Cues for gait sequence and safe use of RW. Gait Pattern: Step-to pattern;Decreased stance time - left;Antalgic;Trunk flexed Gait velocity: Slow gait speed General Gait Details: Cues to stand upright and look forward during gait.    Exercises     PT Diagnosis: Difficulty walking;Acute pain  PT Problem List: Decreased strength;Decreased range of motion;Decreased activity tolerance;Decreased mobility;Decreased knowledge of use of DME;Decreased knowledge of precautions;Pain;Obesity PT Treatment Interventions: DME instruction;Gait training;Functional mobility training;Therapeutic exercise;Patient/family education   PT Goals Acute Rehab PT Goals PT Goal Formulation: With patient Time For Goal Achievement: 06/25/11 Potential to Achieve Goals: Good Pt will go Supine/Side to Sit: Independently;with HOB 0 degrees PT Goal: Supine/Side to Sit - Progress: Goal set today Pt will go Sit to Supine/Side: Independently;with HOB 0 degrees PT Goal: Sit to Supine/Side - Progress: Goal set today Pt will go Sit to Stand: with supervision;with upper extremity assist PT Goal: Sit to Stand - Progress: Goal set today Pt will go Stand to Sit: with supervision;with upper extremity assist PT Goal: Stand to Sit - Progress: Goal set today Pt will Ambulate: >150 feet;with supervision;with rolling walker PT Goal: Ambulate - Progress: Goal set today Pt will Perform Home Exercise Program: with supervision, verbal cues required/provided PT Goal: Perform Home Exercise Program - Progress: Goal set today  Visit Information  Last PT Received On: 06/18/11 Assistance Needed: +1    Subjective Data  Subjective: "I'm ready to walk" Patient Stated Goal: To get to her friend's house so that she  will have  help at discharge.   Prior Functioning  Home Living Lives With: Friend(s) Available Help at Discharge: Friend(s);Available 24 hours/day Type of Home: House Home Access: Ramped entrance Home Layout: One level Bathroom Shower/Tub: Health visitor: Standard Bathroom Accessibility: Yes How Accessible: Accessible via walker Home Adaptive Equipment: Bedside commode/3-in-1;Shower chair without back;Walker - rolling;Wheelchair - manual Prior Function Level of Independence: Independent Able to Take Stairs?: Yes Driving: Yes Vocation: Retired Musician: No difficulties    Cognition  Overall Cognitive Status: Appears within functional limits for tasks assessed/performed Arousal/Alertness: Awake/alert Orientation Level: Oriented X4 / Intact Behavior During Session: [facility] for tasks performed    Extremity/Trunk Assessment Right Upper Extremity Assessment RUE ROM/Strength/Tone: [facility] for tasks assessed Left Upper Extremity Assessment LUE ROM/Strength/Tone: WFL for tasks assessed Right Lower Extremity Assessment RLE ROM/Strength/Tone: [facility] for tasks assessed Left Lower Extremity Assessment LLE ROM/Strength/Tone: Deficits;Unable to fully assess;Due to pain LLE ROM/Strength/Tone Deficits: Strength grossly 3+/5; limited ROM (will measure at next session) LLE Sensation: WFL - Light Touch   Balance    End of Session PT - End of Session Equipment Utilized During Treatment: Gait belt;Left knee immobilizer Activity Tolerance: Patient tolerated treatment well Patient left: in chair;with call bell/phone within reach Nurse Communication: Mobility status;Patient requests pain meds CPM Left Knee CPM Left Knee: Off Left Knee Flexion (Degrees): 60  Left Knee Extension (Degrees): 0    Vena Austria 06/18/2011, 9:33 AM [name]. [name], [facility] Acute Rehab Services Pager [phone]

## 2011-06-18 NOTE — Progress Notes (Signed)
Patient requested to have Morphine PCA decreased.  Dr. Yevette Edwards called and confirmed order to DC PCA.  Patient is currently having adequate pain management with Oxycontin 60mg  q12hr.

## 2011-06-19 LAB — PROTIME-INR
INR: 1.41 (ref 0.00–1.49)
Prothrombin Time: 17.5 seconds — ABNORMAL HIGH (ref 11.6–15.2)

## 2011-06-19 LAB — CBC
HCT: 30.6 % — ABNORMAL LOW (ref 36.0–46.0)
Hemoglobin: 9.7 g/dL — ABNORMAL LOW (ref 12.0–15.0)
MCHC: 31.7 g/dL (ref 30.0–36.0)
RDW: 14.2 % (ref 11.5–15.5)
WBC: 10.6 10*3/uL — ABNORMAL HIGH (ref 4.0–10.5)

## 2011-06-19 MED ORDER — OXYCODONE HCL 30 MG PO TABS: 30.0000 mg | ORAL_TABLET | ORAL | Status: AC | PRN

## 2011-06-19 MED ORDER — WARFARIN SODIUM 7.5 MG PO TABS
7.5000 mg | ORAL_TABLET | Freq: Once | ORAL | Status: DC
  Filled 2011-06-19: qty 1

## 2011-06-19 MED ORDER — WARFARIN SODIUM 5 MG PO TABS: ORAL_TABLET | ORAL | Status: DC

## 2011-06-19 NOTE — Progress Notes (Signed)
Patient continues to progress well with PT.  Pain well controlled. Patient wants to go home.  BP 109/68  Pulse 87  Temp 99 F (37.2 C) (Oral)  Resp 18  Ht 5\' 1"  (1.549 m)  Wt 129.729 kg (286 lb)  BMI 54.04 kg/m2  SpO2 99%   NVI Dressing CDI Hg:9.7 INR:1.41  POD #2 after left TKA, doing well  - drain removed - continue PT/OT - will plan to d/c home today

## 2011-06-19 NOTE — Progress Notes (Signed)
Physical Therapy Treatment Patient Details Name: Carla Leonard MRN: [id number] DOB: 10-Jan-1955 Today's Date: 06/19/2011 Time: 7829-5621 PT Time Calculation (min): 16 min  PT Assessment / Plan / Recommendation Comments on Treatment Session  Much better session once pain addressed.  Patient doing well with ambulation and exercise program.  Ready for discharge from PT standpoint.    Follow Up Recommendations  Home health PT;Supervision/Assistance - 24 hour    Barriers to Discharge        Equipment Recommendations  None recommended by PT    Recommendations for Other Services    Frequency 7X/week   Plan Discharge plan remains appropriate;Frequency remains appropriate    Precautions / Restrictions Precautions Precautions: Knee Required Braces or Orthoses: Knee Immobilizer - Left Knee Immobilizer - Left: On except when in CPM Restrictions Weight Bearing Restrictions: Yes LLE Weight Bearing: Weight bearing as tolerated   Pertinent Vitals/Pain Pain in better control     Mobility  Transfers Transfers: Sit to Stand;Stand to Sit Sit to Stand: 5: Supervision;With upper extremity assist;With armrests;From chair/3-in-1 Stand to Sit: 5: Supervision;With upper extremity assist;With armrests;To chair/3-in-1 Details for Transfer Assistance: No cues needed.  Patient uses safe technique Ambulation/Gait Ambulation/Gait Assistance: 5: Supervision Ambulation Distance (Feet): 118 Feet Assistive device: Rolling walker Ambulation/Gait Assistance Details: Less pain with gait.  Cues to stand upright.  Antalgic gait. Gait Pattern: Step-through pattern;Decreased stance time - left;Antalgic Gait velocity: Slow gait speed General Gait Details: Safe use of RW.  Less pain.    Exercises Total Joint Exercises Ankle Circles/Pumps: AROM;Both;10 reps;Seated Quad Sets: AROM;Left;10 reps;Seated Towel Squeeze: AROM;Both;10 reps;Seated Short Arc Quad: AROM;Left;10 reps;Seated Heel Slides: AROM;Left;10  reps;Seated Hip ABduction/ADduction: AROM;Left;10 reps;Seated Straight Leg Raises: AROM;Left;10 reps;Seated Long Arc Quad: AROM;Left;10 reps;Seated Knee Flexion: AROM;Left;10 reps;Seated Goniometric ROM: -9 ext; 71 flex    PT Goals Acute Rehab PT Goals PT Goal: Sit to Stand - Progress: Met PT Goal: Stand to Sit - Progress: Met PT Goal: Ambulate - Progress: Progressing toward goal PT Goal: Perform Home Exercise Program - Progress: Progressing toward goal  Visit Information  Last PT Received On: 06/19/11 Assistance Needed: +1    Subjective Data  Subjective: "Pain is much better."   Cognition  Overall Cognitive Status: Appears within functional limits for tasks assessed/performed Arousal/Alertness: Awake/alert Orientation Level: Oriented X4 / Intact Behavior During Session: [facility] for tasks performed    Balance     End of Session PT - End of Session Equipment Utilized During Treatment: Gait belt;Left knee immobilizer Activity Tolerance: Patient tolerated treatment well Patient left: in chair;with call bell/phone within reach;with nursing in room Nurse Communication: Mobility status    Vena Austria 06/19/2011, 1:36 PM [name]. [name], [facility] Acute Rehab Services Pager [phone]

## 2011-06-19 NOTE — Progress Notes (Signed)
Discharge instructions and prescriptions reviewed and given to patient.  Patient verbalized understanding.  She will call Monday to schedule an appointment with Dr. Luiz Blare.  Patient has all belongings and verbalized understanding of Total knee education.  She was instructed to call Dr. Luiz Blare should any issues arise prior to follow-up appointment.  Hali Balgobin N

## 2011-06-19 NOTE — Progress Notes (Signed)
ANTICOAGULATION CONSULT NOTE - Follow Up Consult  Pharmacy Consult for Coumadin Indication: VTE prophylaxis s/p L TKA on 6/14   Allergies  Allergen Reactions  . Sulfa Antibiotics Anaphylaxis  . Nsaids Other (See Comments)    Stomach pain, diarrhea    Patient Measurements: Height: 5\' 1"  (154.9 cm) Weight: 286 lb (129.729 kg) IBW/kg (Calculated) : 47.8    Vital Signs: Temp: 99 F (37.2 C) (06/16 0706) BP: 109/68 mmHg (06/16 0706) Pulse Rate: 87  (06/16 0706)  Labs:  Basename 06/19/11 0500 06/18/11 0650 06/17/11 0626  HGB 9.7* 10.3* --  HCT 30.6* 32.8* --  PLT 237 228 --  APTT -- -- 45*  LABPROT 17.5* 16.1* 13.7  INR 1.41 1.26 1.03  HEPARINUNFRC -- -- --  CREATININE -- 0.90 0.80  CKTOTAL -- -- --  CKMB -- -- --  TROPONINI -- -- --    Estimated Creatinine Clearance: 88.8 ml/min (by C-G formula based on Cr of 0.9).     Assessment: 56 y.o. F on warfarin for VTE px s/p L TKA on 6/14. Baseline INR 1.03. Pre-op Hgb~14.1. Warfarin points~4  todays INR 1.41 after 7.55 mg dose  Goal of Therapy: INR 2.0  Monitor platelets by anticoagulation protocol: Yes   Plan:  1. Warfarin 7.5 mg at 1800  2. Daily PT/INR  3.Will continue to monitor for any signs/symptoms of bleeding  4. Will educate pt      Lucille Passy 06/19/2011,1:09 PM

## 2011-06-19 NOTE — Progress Notes (Signed)
Patient requested IV removal due to discomfort.  Patient has PO pain medication, therefore IV was DC per pt request.

## 2011-06-19 NOTE — Discharge Summary (Signed)
Patient ID: Carla Leonard MRN: [id number] DOB/AGE: 56-31-1957 56 y.o.  Admit date: 06/17/2011 Discharge date: 06/19/2011  Admission Diagnoses:  Principal Problem:  *Osteoarthritis of left knee Active Problems:  Ganglion of right wrist   Discharge Diagnoses:  Same  Past Medical History  Diagnosis Date  . Hypertension   . Depression   . Arthritis     Surgeries: Procedure(s):Left COMPUTER ASSISTED TOTAL KNEE ARTHROPLASTY on 06/17/2011 Aspiration/cortisone injection of ganglion cyst right wrist  Discharged Condition: Improved  Hospital Course: Carla Leonard is an 56 y.o. female who was admitted 06/17/2011 for operative treatment ofOsteoarthritis of left knee. Patient has severe unremitting pain that affects sleep, daily activities, and work/hobbies. After pre-op clearance the patient was taken to the operating room on 06/17/2011 and underwent  Procedure(s):Left COMPUTER ASSISTED TOTAL KNEE ARTHROPLASTY. And injection/aspiration of ganglion cyst right wrist.   Patient was given perioperative antibiotics: Anti-infectives     Start     Dose/Rate Route Frequency Ordered Stop   06/17/11 1400   ceFAZolin (ANCEF) IVPB 2 g/50 mL premix        2 g 100 mL/hr over 30 Minutes Intravenous Every 6 hours 06/17/11 1253 06/17/11 2031   06/17/11 0825   cefUROXime (ZINACEF) injection  Status:  Discontinued          As needed 06/17/11 0825 06/17/11 1005   06/16/11 1435   ceFAZolin (ANCEF) IVPB 2 g/50 mL premix        2 g 100 mL/hr over 30 Minutes Intravenous 60 min pre-op 06/16/11 1435 06/17/11 0736           Patient was given sequential compression devices, early ambulation, and chemoprophylaxis to prevent DVT.  Patient benefited maximally from hospital stay and there were no complications.    Recent vital signs: Patient Vitals for the past 24 hrs:  BP Temp Pulse Resp SpO2  06/19/11 0706 109/68 mmHg 99 F (37.2 C) 87  18  99 %  07-08-11 2343 112/62 mmHg 99.8 F (37.7 C) 69  18   98 %     Recent laboratory studies:  Basename 06/19/11 0500 Jul 08, 2011 0650 06/17/11 0626  WBC 10.6* 9.3 --  HGB 9.7* 10.3* --  HCT 30.6* 32.8* --  PLT 237 228 --  NA -- 138 140  K -- 4.5 4.1  CL -- 102 103  CO2 -- 31 29  BUN -- 11 11  CREATININE -- 0.90 0.80  GLUCOSE -- 122* 93  INR 1.41 1.26 --  CALCIUM -- 8.5 --     Discharge Medications:   Medication List  As of 06/19/2011  4:26 PM   STOP taking these medications         ARIPiprazole 5 MG tablet         TAKE these medications         metoprolol succinate 25 MG 24 hr tablet   Commonly known as: TOPROL-XL   Take 25 mg by mouth daily.      OXYCONTIN 60 MG Tb12   Generic drug: Oxycodone HCl   Take 1 tablet by mouth 2 (two) times daily.      oxycodone 30 MG immediate release tablet   Commonly known as: ROXICODONE   Take 30 mg by mouth every 4 (four) hours as needed. For pain      oxycodone 30 MG immediate release tablet   Commonly known as: ROXICODONE   Take 1 tablet (30 mg total) by mouth every 4 (four) hours as needed.  venlafaxine XR 75 MG 24 hr capsule   Commonly known as: EFFEXOR-XR   Take 225 mg by mouth daily.      warfarin 5 MG tablet   Commonly known as: COUMADIN   Take 5mg /day, unless otherwise directed, goal INR is 2.0            Diagnostic Studies: Dg Chest 2 View  06/10/2011  *RADIOLOGY REPORT*  Clinical Data: Preop knee replacement.  Hypertension  CHEST - 2 VIEW  Comparison: 06/07/2010  Findings: The heart size and mediastinal contours are within normal limits.  Both lungs are clear.  The visualized skeletal structures are unremarkable.  IMPRESSION: Negative exam.  Original Report Authenticated By: Rosealee Albee, M.D.    Disposition: 01-Home or Self Care She is set up for HHPT and HHRN for protimes and coumadin management.  Also Home CPM as directed.    Follow-up Information    Follow up with GRAVES,JOHN L, MD. Schedule an appointment as soon as possible for a visit in 2 weeks.    Contact information:   [address] [place] [address] [phone]           Signed: Matthew Folks 06/19/2011, 4:26 PM

## 2011-06-19 NOTE — Progress Notes (Signed)
Physical Therapy Treatment Patient Details Name: Carla Leonard MRN: [id number] DOB: 07-02-1955 Today's Date: 06/19/2011 Time: 4098-1191 PT Time Calculation (min): 19 min  PT Assessment / Plan / Recommendation Comments on Treatment Session  Patient had difficulty time with mobility today due to increased pain off PCA.  Spoke with RN - addressing pain.  Will return later once pain meds provided.    Follow Up Recommendations  Home health PT;Supervision/Assistance - 24 hour    Barriers to Discharge        Equipment Recommendations  None recommended by PT    Recommendations for Other Services    Frequency 7X/week   Plan Discharge plan remains appropriate;Frequency remains appropriate    Precautions / Restrictions Precautions Precautions: Knee Required Braces or Orthoses: Knee Immobilizer - Left Knee Immobilizer - Left: On except when in CPM Restrictions Weight Bearing Restrictions: Yes LLE Weight Bearing: Weight bearing as tolerated   Pertinent Vitals/Pain Pain limiting session this am.    Mobility  Transfers Transfers: Sit to Stand;Stand to Sit Sit to Stand: 5: Supervision;With upper extremity assist;From chair/3-in-1;With armrests Stand to Sit: 4: Min assist;With upper extremity assist;With armrests;To chair/3-in-1 (Patient with increased pain - assist to get into chair) Details for Transfer Assistance: Assist to get to chair (tech bringing chair to hallway) due to pain.  Ambulation/Gait Ambulation/Gait Assistance: 4: Min guard Ambulation Distance (Feet): 60 Feet Assistive device: Rolling walker Ambulation/Gait Assistance Details: Pain increased with gait today to 10/10.  Cues to stand upright.  Needed to stop gait training and sit in chair due to pain. Gait Pattern: Step-through pattern;Decreased stance time - left;Decreased weight shift to left;Antalgic Gait velocity: Slow gait speed General Gait Details: Cues to stand upright and look forward during gait.      PT  Goals Acute Rehab PT Goals PT Goal: Sit to Stand - Progress: Progressing toward goal PT Goal: Stand to Sit - Progress: Progressing toward goal PT Goal: Ambulate - Progress: Not progressing (due to pain)  Visit Information  Last PT Received On: 06/19/11 Assistance Needed: +1    Subjective Data  Subjective: "I can't stand this pain (while ambulating)."  Pain meds due.   Cognition  Overall Cognitive Status: Appears within functional limits for tasks assessed/performed Arousal/Alertness: Awake/alert Orientation Level: Oriented X4 / Intact Behavior During Session: WFL for tasks performed    Balance     End of Session PT - End of Session Equipment Utilized During Treatment: Gait belt Activity Tolerance: Patient limited by pain Patient left: in chair;with call bell/phone within reach;with nursing in room Nurse Communication: Patient requests pain meds    Vena Austria 06/19/2011, 1:25 PM [name]. [name], [facility] Acute Rehab Services Pager [phone]

## 2011-06-20 ENCOUNTER — Encounter (HOSPITAL_COMMUNITY): Payer: Self-pay | Admitting: Orthopedic Surgery

## 2011-06-29 ENCOUNTER — Ambulatory Visit (HOSPITAL_COMMUNITY)
Admission: RE | Admit: 2011-06-29 | Discharge: 2011-06-29 | Disposition: A | Discharge status: Home / Self Care | Payer: Medicare Other | Source: Ambulatory Visit | Attending: Orthopaedic Surgery | Admitting: Orthopaedic Surgery

## 2011-06-29 DIAGNOSIS — M1712 Unilateral primary osteoarthritis, left knee: Secondary | ICD-10-CM

## 2011-06-29 DIAGNOSIS — M79609 Pain in unspecified limb: Secondary | ICD-10-CM

## 2011-06-29 DIAGNOSIS — M1710 Unilateral primary osteoarthritis, unspecified knee: Secondary | ICD-10-CM | POA: Insufficient documentation

## 2011-06-29 DIAGNOSIS — M7989 Other specified soft tissue disorders: Secondary | ICD-10-CM | POA: Insufficient documentation

## 2011-06-29 DIAGNOSIS — M79606 Pain in leg, unspecified: Secondary | ICD-10-CM

## 2011-06-29 DIAGNOSIS — IMO0002 Reserved for concepts with insufficient information to code with codable children: Secondary | ICD-10-CM | POA: Insufficient documentation

## 2011-06-29 NOTE — Progress Notes (Signed)
VASCULAR LAB PRELIMINARY  PRELIMINARY  PRELIMINARY  PRELIMINARY  Left lower extremity venous duplex completed.    Preliminary report: No obvious DVT noted in the left lower extremity.  Difficult to visualize the veins in the mid to distal thigh and in the proximal calf veins due to swelling.   Report called to Dr. Marcene Corning.  Margretta Zamorano,  RVT 06/29/2011, 7:59 PM

## 2012-06-12 IMAGING — CR DG CHEST 2V
2 series · 2 of 2 positions shown · non-contrast
Comparison: 06/07/2010

CLINICAL DATA: Preop knee replacement.  Hypertension

CHEST - 2 VIEW

[view not recorded (1 of 2)]
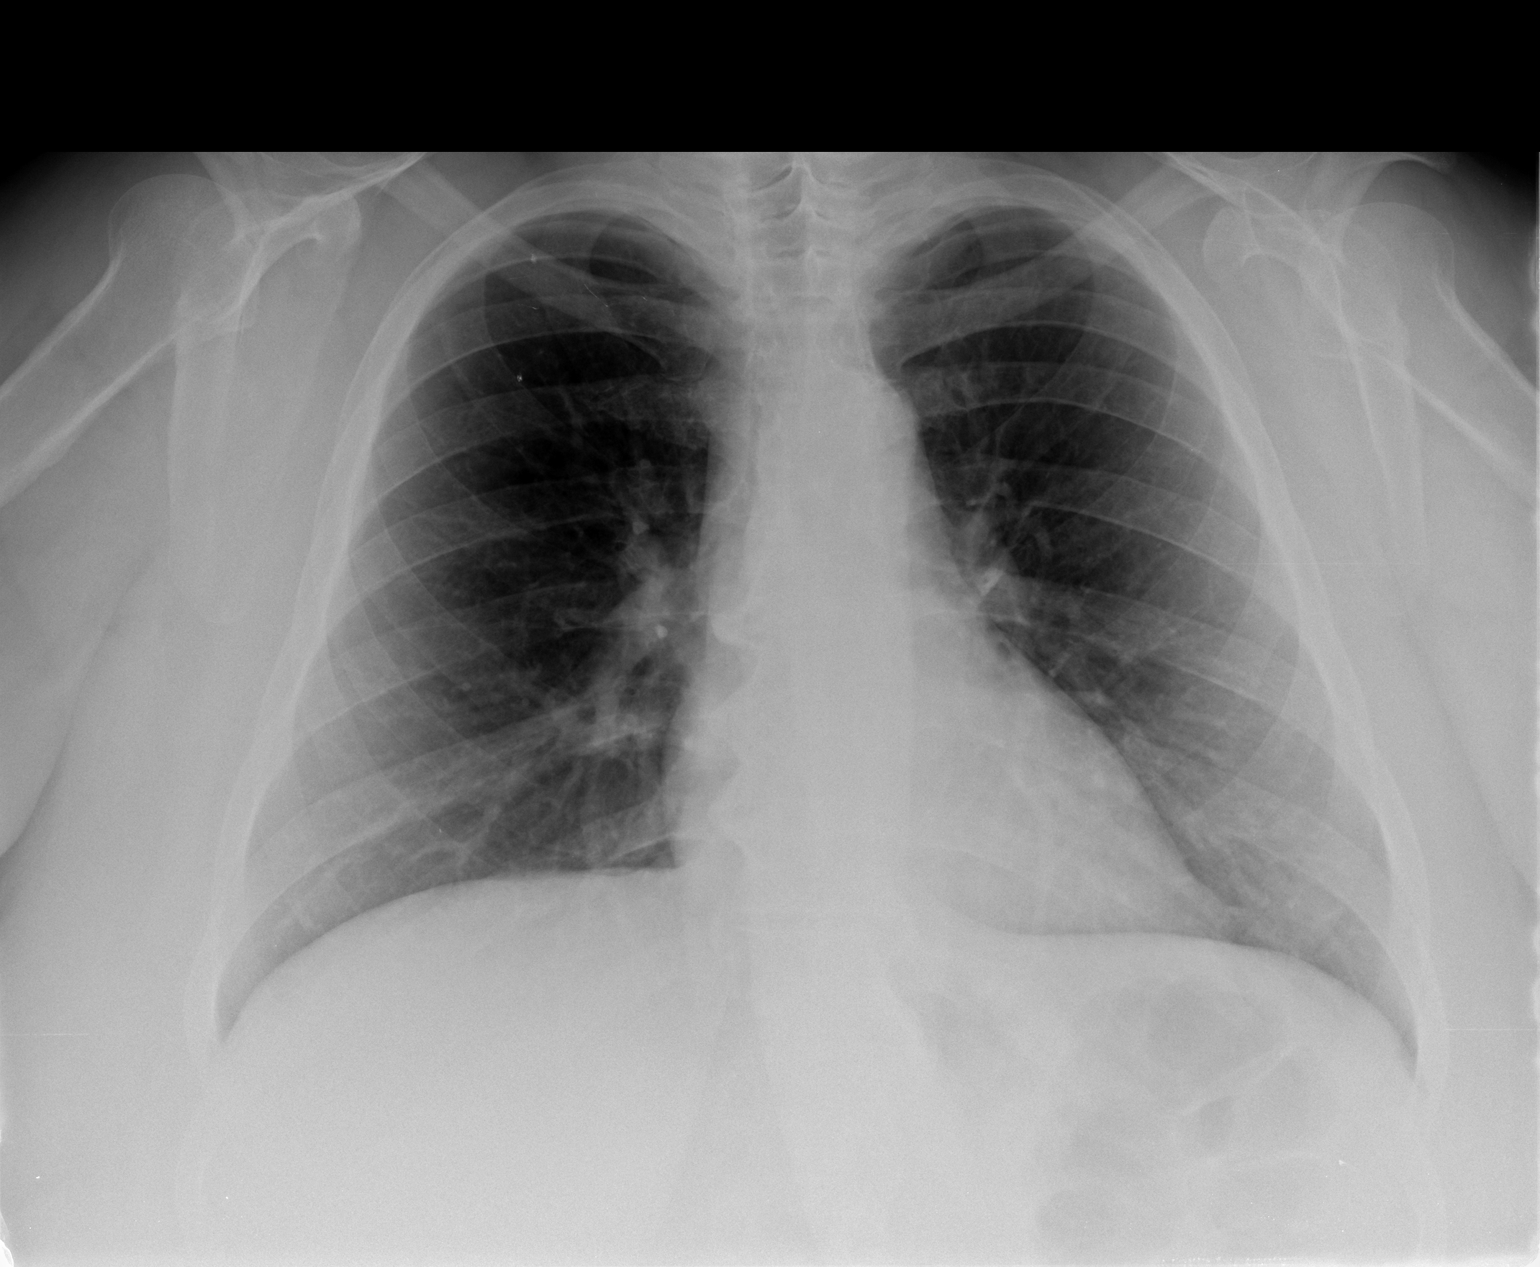

[view not recorded (2 of 2)]
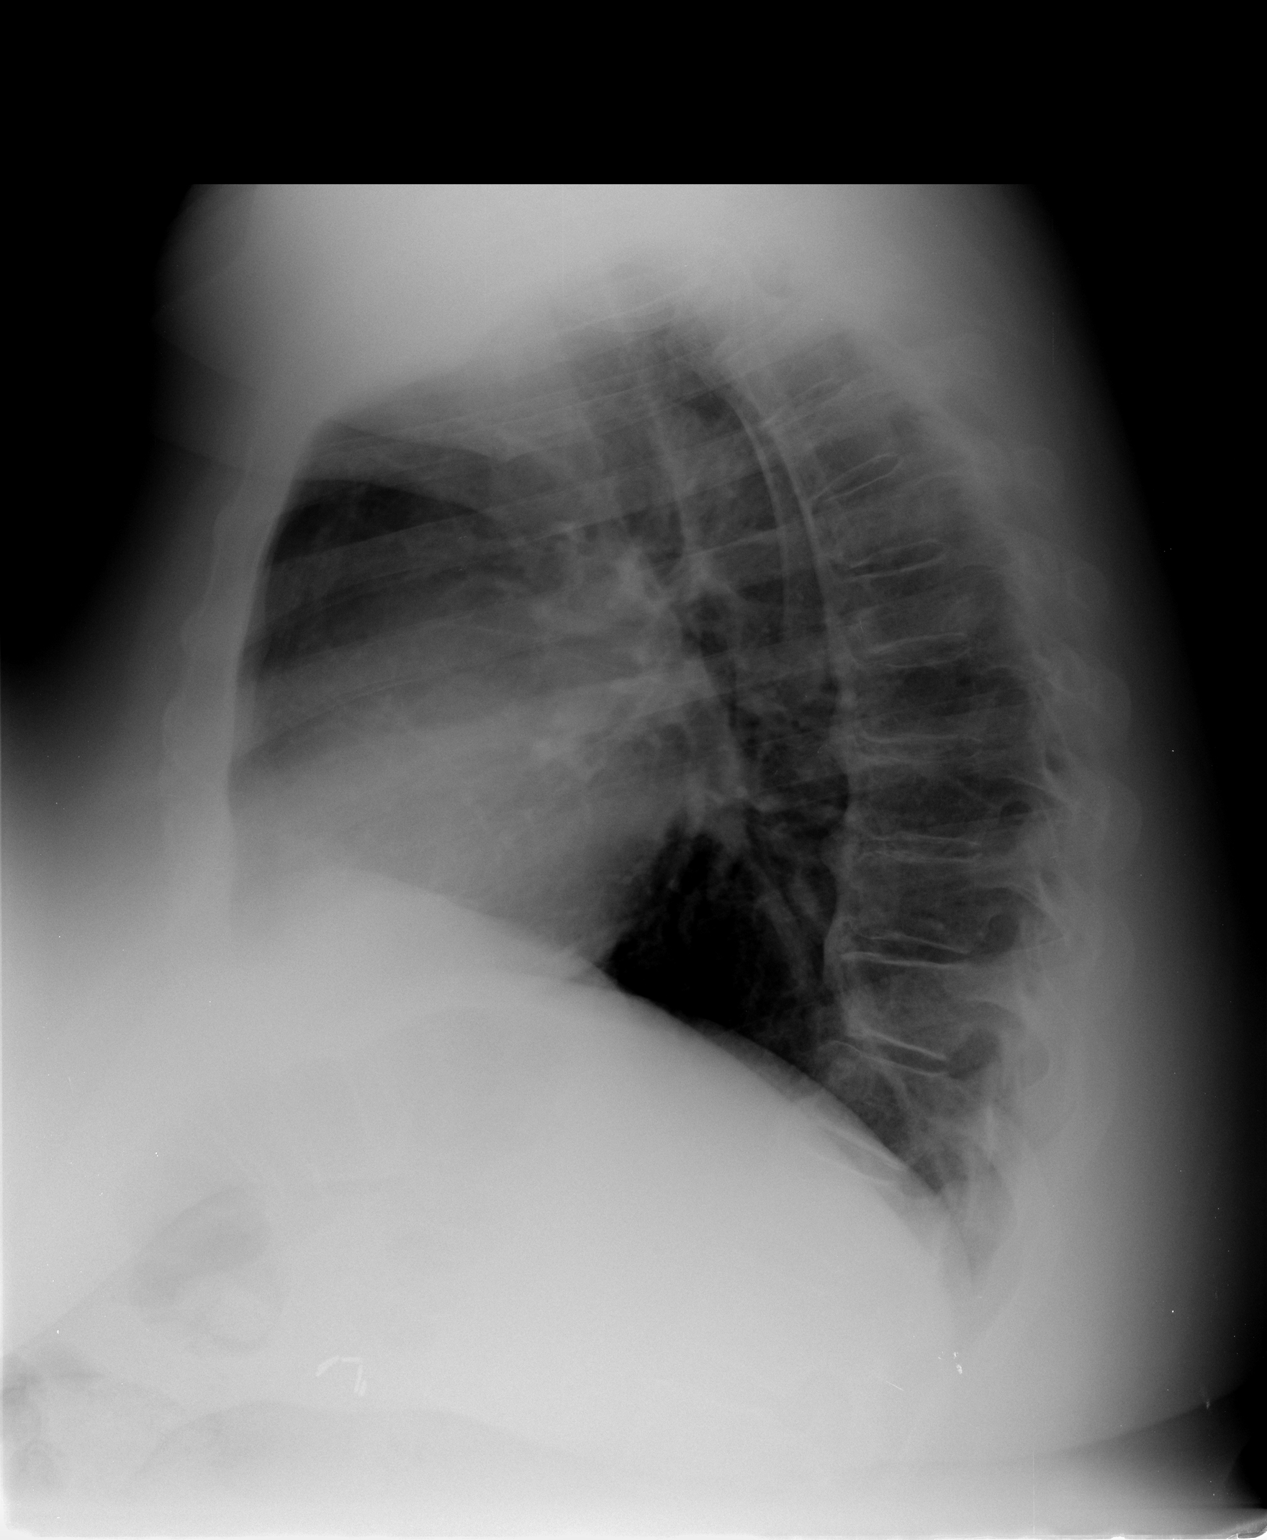

[2 of 2 positions shown; findings below may reference images not displayed]

FINDINGS: The heart size and mediastinal contours are within normal
limits.  Both lungs are clear.  The visualized skeletal structures
are unremarkable.
IMPRESSION: Negative exam.

## 2013-03-26 ENCOUNTER — Other Ambulatory Visit: Payer: Self-pay | Admitting: Orthopedic Surgery

## 2013-03-26 DIAGNOSIS — M545 Low back pain, unspecified: Secondary | ICD-10-CM

## 2013-04-09 ENCOUNTER — Ambulatory Visit
Admission: RE | Admit: 2013-04-09 | Discharge: 2013-04-09 | Disposition: A | Discharge status: Home / Self Care | Payer: Medicare Other | Source: Ambulatory Visit | Attending: Orthopedic Surgery | Admitting: Orthopedic Surgery

## 2013-04-09 DIAGNOSIS — M545 Low back pain, unspecified: Secondary | ICD-10-CM

## 2013-05-24 ENCOUNTER — Other Ambulatory Visit (HOSPITAL_COMMUNITY): Payer: Self-pay | Admitting: Internal Medicine

## 2013-05-24 DIAGNOSIS — Z1231 Encounter for screening mammogram for malignant neoplasm of breast: Secondary | ICD-10-CM

## 2013-06-06 ENCOUNTER — Ambulatory Visit (HOSPITAL_COMMUNITY): Payer: Medicare Other

## 2014-12-03 ENCOUNTER — Ambulatory Visit
Admission: RE | Admit: 2014-12-03 | Discharge: 2014-12-03 | Disposition: A | Discharge status: Home / Self Care | Payer: Medicare Other | Source: Ambulatory Visit | Attending: Internal Medicine | Admitting: Internal Medicine

## 2014-12-03 ENCOUNTER — Other Ambulatory Visit: Payer: Self-pay | Admitting: Internal Medicine

## 2014-12-03 DIAGNOSIS — M545 Low back pain, unspecified: Secondary | ICD-10-CM

## 2015-05-12 ENCOUNTER — Other Ambulatory Visit: Payer: Self-pay | Admitting: Neurosurgery

## 2015-05-25 ENCOUNTER — Other Ambulatory Visit (HOSPITAL_COMMUNITY): Payer: Self-pay | Admitting: Internal Medicine

## 2015-05-25 DIAGNOSIS — Z01818 Encounter for other preprocedural examination: Secondary | ICD-10-CM

## 2015-05-25 DIAGNOSIS — R0602 Shortness of breath: Secondary | ICD-10-CM

## 2015-05-28 ENCOUNTER — Encounter (HOSPITAL_COMMUNITY): Payer: Self-pay

## 2015-05-28 ENCOUNTER — Encounter (HOSPITAL_COMMUNITY)
Admission: RE | Admit: 2015-05-28 | Discharge: 2015-05-28 | Disposition: A | Discharge status: Home / Self Care | Payer: Medicare Other | Source: Ambulatory Visit | Attending: Neurosurgery | Admitting: Neurosurgery

## 2015-05-28 ENCOUNTER — Encounter (HOSPITAL_COMMUNITY): Payer: Self-pay | Admitting: Vascular Surgery

## 2015-05-28 DIAGNOSIS — Z79899 Other long term (current) drug therapy: Secondary | ICD-10-CM | POA: Diagnosis not present

## 2015-05-28 DIAGNOSIS — Z01812 Encounter for preprocedural laboratory examination: Secondary | ICD-10-CM | POA: Insufficient documentation

## 2015-05-28 DIAGNOSIS — M1990 Unspecified osteoarthritis, unspecified site: Secondary | ICD-10-CM | POA: Insufficient documentation

## 2015-05-28 DIAGNOSIS — R9431 Abnormal electrocardiogram [ECG] [EKG]: Secondary | ICD-10-CM | POA: Diagnosis not present

## 2015-05-28 DIAGNOSIS — Z96653 Presence of artificial knee joint, bilateral: Secondary | ICD-10-CM | POA: Diagnosis not present

## 2015-05-28 DIAGNOSIS — I10 Essential (primary) hypertension: Secondary | ICD-10-CM | POA: Insufficient documentation

## 2015-05-28 DIAGNOSIS — Z0183 Encounter for blood typing: Secondary | ICD-10-CM | POA: Diagnosis not present

## 2015-05-28 DIAGNOSIS — F329 Major depressive disorder, single episode, unspecified: Secondary | ICD-10-CM | POA: Insufficient documentation

## 2015-05-28 DIAGNOSIS — Z01818 Encounter for other preprocedural examination: Secondary | ICD-10-CM | POA: Diagnosis present

## 2015-05-28 HISTORY — DX: Personal history of urinary calculi: Z87.442

## 2015-05-28 LAB — BASIC METABOLIC PANEL
ANION GAP: 7 (ref 5–15)
BUN: 11 mg/dL (ref 6–20)
CHLORIDE: 100 mmol/L — AB (ref 101–111)
CO2: 32 mmol/L (ref 22–32)
Calcium: 9.2 mg/dL (ref 8.9–10.3)
Creatinine, Ser: 1.09 mg/dL — ABNORMAL HIGH (ref 0.44–1.00)
GFR calc non Af Amer: 54 mL/min — ABNORMAL LOW (ref >60)
GLUCOSE: 96 mg/dL (ref 65–99)
POTASSIUM: 3.6 mmol/L (ref 3.5–5.1)
Sodium: 139 mmol/L (ref 135–145)

## 2015-05-28 LAB — CBC
HEMATOCRIT: 42.5 % (ref 36.0–46.0)
HEMOGLOBIN: 13.5 g/dL (ref 12.0–15.0)
MCH: 31.3 pg (ref 26.0–34.0)
MCHC: 31.8 g/dL (ref 30.0–36.0)
MCV: 98.6 fL (ref 78.0–100.0)
Platelets: 218 10*3/uL (ref 150–400)
RBC: 4.31 MIL/uL (ref 3.87–5.11)
RDW: 13.9 % (ref 11.5–15.5)
WBC: 8.6 10*3/uL (ref 4.0–10.5)

## 2015-05-28 LAB — SURGICAL PCR SCREEN
MRSA, PCR: POSITIVE — AB
STAPHYLOCOCCUS AUREUS: POSITIVE — AB

## 2015-05-28 LAB — TYPE AND SCREEN
ABO/RH(D): O POS
ANTIBODY SCREEN: NEGATIVE

## 2015-05-28 NOTE — Progress Notes (Signed)
PATIENT DENIED ANY CARDIAC HISTORY AND NO CARDIAC TEST.  PATIENT IS SCHEDULED FOR STRESS TEST ON 06/02/15.  PCP [place].

## 2015-05-28 NOTE — Progress Notes (Signed)
Anesthesia Chart Review: Patient is a 60 year old female scheduled for PLIF L4-5 on 06/05/15 by Dr. Saintclair Halsted.  History includes smoking, HTN, depression, arthritis, nephrolithiasis, appendectomy, cholecystectomy, right TKA '12, left TKA '13. BMI is consistent with morbid obesity (BMI 50.69).   Preoperative stress test has been ordered by PCP Dr. Celedonio Miyamoto. Test is scheduled for 06/02/15.   Meds include Xanax, Lasix, Toprol XL, potassium gluconate, trazodone, vortioxetine, oxycodone.  05/28/15 EKG: SB at 56 bpm, low voltage QRS, septal infarct (age undetermined).  Preoperative labs noted. T&S done.  Chart will be left for follow-up 06/02/15 stress test results.  George Hugh [facility] Short Stay Center/Anesthesiology Phone [phone] 05/28/2015 2:41 PM

## 2015-05-28 NOTE — Pre-Procedure Instructions (Signed)
KEYSHIA KEARN  05/28/2015      [id number] [place], [place] [facility] [place] [address] [place] [address] Phone: [phone] Fax: [phone]  [place], [place] [id number] [place] [address] Phone: [phone] Fax: [phone]    Your procedure is scheduled on   Friday  06/05/15  Report to [place] at 530 A.M.  Call this number if you have problems the morning of surgery:  [phone]   Remember:  Do not eat food or drink liquids after midnight.  Take these medicines the morning of surgery with A SIP OF WATER   METOPROLOL(TOPROL), OXYCODONE IF NEEDED, VENLAFAXINE (EFFEXOR)    (STOP ASPIRIN , ASPIRIN PRODUCTS, IBUPROFEN, ADVIL, MOTRIN, GOODYS, BC'S, HERBAL MEDICINES)   Do not wear jewelry, make-up or nail polish.  Do not wear lotions, powders, or perfumes.  You may wear deodorant.  Do not shave 48 hours prior to surgery.  Men may shave face and neck.  Do not bring valuables to the hospital.  [facility] is not responsible for any belongings or valuables.  Contacts, dentures or bridgework may not be worn into surgery.  Leave your suitcase in the car.  After surgery it may be brought to your room.  For patients admitted to the hospital, discharge time will be determined by your treatment team.  Patients discharged the day of surgery will not be allowed to drive home.   Name and phone number of your driver:   Special instructions:  [place] - Preparing for Surgery  Before surgery, you can play an important role.  Because skin is not sterile, your skin needs to be as free of germs as possible.  You can reduce the number of germs on you skin by washing with CHG (chlorahexidine gluconate) soap before surgery.  CHG is an antiseptic cleaner which kills germs and bonds with the skin to continue killing germs even after washing.  Please DO NOT use if you have an allergy to  CHG or antibacterial soaps.  If your skin becomes reddened/irritated stop using the CHG and inform your nurse when you arrive at Short Stay.  Do not shave (including legs and underarms) for at least 48 hours prior to the first CHG shower.  You may shave your face.  Please follow these instructions carefully:   1.  Shower with CHG Soap the night before surgery and the                                morning of Surgery.  2.  If you choose to wash your hair, wash your hair first as usual with your       normal shampoo.  3.  After you shampoo, rinse your hair and body thoroughly to remove the                      Shampoo.  4.  Use CHG as you would any other liquid soap.  You can apply chg directly       to the skin and wash gently with scrungie or a clean washcloth.  5.  Apply the CHG Soap to your body ONLY FROM THE NECK DOWN.        Do not use on open wounds or open sores.  Avoid contact with your eyes,  ears, mouth and genitals (private parts).  Wash genitals (private parts)       with your normal soap.  6.  Wash thoroughly, paying special attention to the area where your surgery        will be performed.  7.  Thoroughly rinse your body with warm water from the neck down.  8.  DO NOT shower/wash with your normal soap after using and rinsing off       the CHG Soap.  9.  Pat yourself dry with a clean towel.            10.  Wear clean pajamas.            11.  Place clean sheets on your bed the night of your first shower and do not        sleep with pets.  Day of Surgery  Do not apply any lotions/deoderants the morning of surgery.  Please wear clean clothes to the hospital/surgery center.    Please read over the following fact sheets that you were given. Pain Booklet, Coughing and Deep Breathing, Blood Transfusion Information, MRSA Information and Surgical Site Infection Prevention

## 2015-06-02 ENCOUNTER — Other Ambulatory Visit (HOSPITAL_COMMUNITY): Payer: Self-pay | Admitting: Internal Medicine

## 2015-06-02 ENCOUNTER — Encounter (HOSPITAL_COMMUNITY)
Admission: RE | Admit: 2015-06-02 | Discharge: 2015-06-02 | Disposition: A | Discharge status: Home / Self Care | Payer: Medicare Other | Source: Ambulatory Visit | Attending: Internal Medicine | Admitting: Internal Medicine

## 2015-06-02 DIAGNOSIS — R0602 Shortness of breath: Secondary | ICD-10-CM | POA: Diagnosis not present

## 2015-06-02 MED ORDER — REGADENOSON 0.4 MG/5ML IV SOLN
0.4000 mg | Freq: Once | INTRAVENOUS | Status: AC
  Administered 2015-06-02: 0.4 mg via INTRAVENOUS

## 2015-06-02 MED ORDER — REGADENOSON 0.4 MG/5ML IV SOLN
INTRAVENOUS | Status: AC
  Filled 2015-06-02: qty 5

## 2015-06-02 NOTE — Progress Notes (Addendum)
Called to do MV on pt, preop for back surgery.  She has never seen cardiology. No hx chest pain or DOE. Activity limited by back pain, can walk with a cane and get around without difficulty.  No sig abnormalities on brief exam except LE edema (chronic problem per pt) VSS  OK to do MV, 2 day study, completes 05/31, [place] to read.  Rosaria Ferries, PA-C 06/02/2015 10:02 AM Beeper [phone]

## 2015-06-03 LAB — NM MYOCAR MULTI W/SPECT W/WALL MOTION / EF
CHL CUP RESTING HR STRESS: 66 {beats}/min
CSEPED: 5 min
CSEPEDS: 21 s
Estimated workload: 1 METS
MPHR: 160 {beats}/min
Peak HR: 89 {beats}/min
Percent HR: 55 %

## 2015-06-03 MED ORDER — TECHNETIUM TC 99M TETROFOSMIN IV KIT
30.0000 | PACK | Freq: Once | INTRAVENOUS | Status: AC | PRN
  Administered 2015-06-03: 30 via INTRAVENOUS

## 2015-06-05 ENCOUNTER — Inpatient Hospital Stay (HOSPITAL_COMMUNITY): Admission: RE | Admit: 2015-06-05 | Payer: Medicare Other | Source: Ambulatory Visit | Admitting: Neurosurgery

## 2015-06-05 ENCOUNTER — Encounter (HOSPITAL_COMMUNITY): Admission: RE | Payer: Self-pay | Source: Ambulatory Visit

## 2015-06-05 SURGERY — POSTERIOR LUMBAR FUSION 1 LEVEL
Anesthesia: General | Site: Back

## 2015-06-14 ENCOUNTER — Emergency Department (HOSPITAL_COMMUNITY): Payer: Medicare Other

## 2015-06-14 ENCOUNTER — Encounter (HOSPITAL_COMMUNITY): Payer: Self-pay

## 2015-06-14 ENCOUNTER — Inpatient Hospital Stay (HOSPITAL_COMMUNITY)
Admission: EM | Admit: 2015-06-14 | Discharge: 2015-06-17 | DRG: 603 | Disposition: A | Discharge status: Home / Self Care | Payer: Medicare Other | Attending: Internal Medicine | Admitting: Internal Medicine

## 2015-06-14 DIAGNOSIS — T148XXA Other injury of unspecified body region, initial encounter: Secondary | ICD-10-CM

## 2015-06-14 DIAGNOSIS — E876 Hypokalemia: Secondary | ICD-10-CM | POA: Diagnosis present

## 2015-06-14 DIAGNOSIS — Z789 Other specified health status: Secondary | ICD-10-CM

## 2015-06-14 DIAGNOSIS — L0390 Cellulitis, unspecified: Secondary | ICD-10-CM | POA: Insufficient documentation

## 2015-06-14 DIAGNOSIS — F418 Other specified anxiety disorders: Secondary | ICD-10-CM

## 2015-06-14 DIAGNOSIS — N179 Acute kidney failure, unspecified: Secondary | ICD-10-CM | POA: Diagnosis present

## 2015-06-14 DIAGNOSIS — T148 Other injury of unspecified body region: Secondary | ICD-10-CM

## 2015-06-14 DIAGNOSIS — Z96653 Presence of artificial knee joint, bilateral: Secondary | ICD-10-CM | POA: Diagnosis present

## 2015-06-14 DIAGNOSIS — I10 Essential (primary) hypertension: Secondary | ICD-10-CM | POA: Diagnosis not present

## 2015-06-14 DIAGNOSIS — Z79899 Other long term (current) drug therapy: Secondary | ICD-10-CM

## 2015-06-14 DIAGNOSIS — Z79891 Long term (current) use of opiate analgesic: Secondary | ICD-10-CM

## 2015-06-14 DIAGNOSIS — Z7901 Long term (current) use of anticoagulants: Secondary | ICD-10-CM

## 2015-06-14 DIAGNOSIS — N189 Chronic kidney disease, unspecified: Secondary | ICD-10-CM | POA: Diagnosis present

## 2015-06-14 DIAGNOSIS — M549 Dorsalgia, unspecified: Secondary | ICD-10-CM | POA: Diagnosis present

## 2015-06-14 DIAGNOSIS — G8929 Other chronic pain: Secondary | ICD-10-CM | POA: Diagnosis present

## 2015-06-14 DIAGNOSIS — L03115 Cellulitis of right lower limb: Secondary | ICD-10-CM | POA: Diagnosis not present

## 2015-06-14 DIAGNOSIS — N289 Disorder of kidney and ureter, unspecified: Secondary | ICD-10-CM | POA: Insufficient documentation

## 2015-06-14 DIAGNOSIS — F419 Anxiety disorder, unspecified: Secondary | ICD-10-CM | POA: Diagnosis present

## 2015-06-14 DIAGNOSIS — F329 Major depressive disorder, single episode, unspecified: Secondary | ICD-10-CM | POA: Diagnosis present

## 2015-06-14 DIAGNOSIS — F17200 Nicotine dependence, unspecified, uncomplicated: Secondary | ICD-10-CM | POA: Diagnosis present

## 2015-06-14 LAB — CG4 I-STAT (LACTIC ACID): Lactic Acid, Venous: 1.5 mmol/L (ref 0.5–2.0)

## 2015-06-14 LAB — URINALYSIS, ROUTINE W REFLEX MICROSCOPIC
BILIRUBIN URINE: NEGATIVE
GLUCOSE, UA: NEGATIVE mg/dL
HGB URINE DIPSTICK: NEGATIVE
Ketones, ur: NEGATIVE mg/dL
Nitrite: NEGATIVE
PH: 5.5 (ref 5.0–8.0)
Protein, ur: NEGATIVE mg/dL
SPECIFIC GRAVITY, URINE: 1.017 (ref 1.005–1.030)

## 2015-06-14 LAB — COMPREHENSIVE METABOLIC PANEL
ALT: 23 U/L (ref 14–54)
AST: 24 U/L (ref 15–41)
Albumin: 3.3 g/dL — ABNORMAL LOW (ref 3.5–5.0)
Alkaline Phosphatase: 163 U/L — ABNORMAL HIGH (ref 38–126)
Anion gap: 13 (ref 5–15)
BUN: 27 mg/dL — AB (ref 6–20)
CHLORIDE: 92 mmol/L — AB (ref 101–111)
CO2: 33 mmol/L — AB (ref 22–32)
CREATININE: 1.28 mg/dL — AB (ref 0.44–1.00)
Calcium: 8.9 mg/dL (ref 8.9–10.3)
GFR calc non Af Amer: 45 mL/min — ABNORMAL LOW (ref >60)
GFR, EST AFRICAN AMERICAN: 52 mL/min — AB (ref >60)
Glucose, Bld: 108 mg/dL — ABNORMAL HIGH (ref 65–99)
Potassium: 2.7 mmol/L — CL (ref 3.5–5.1)
SODIUM: 138 mmol/L (ref 135–145)
Total Bilirubin: 0.4 mg/dL (ref 0.3–1.2)
Total Protein: 6.9 g/dL (ref 6.5–8.1)

## 2015-06-14 LAB — CBC WITH DIFFERENTIAL/PLATELET
Basophils Absolute: 0 10*3/uL (ref 0.0–0.1)
Basophils Relative: 0 %
EOS ABS: 0.2 10*3/uL (ref 0.0–0.7)
Eosinophils Relative: 2 %
HCT: 38.4 % (ref 36.0–46.0)
HEMOGLOBIN: 12.9 g/dL (ref 12.0–15.0)
LYMPHS ABS: 2.5 10*3/uL (ref 0.7–4.0)
Lymphocytes Relative: 28 %
MCH: 31.6 pg (ref 26.0–34.0)
MCHC: 33.6 g/dL (ref 30.0–36.0)
MCV: 94.1 fL (ref 78.0–100.0)
MONOS PCT: 9 %
Monocytes Absolute: 0.8 10*3/uL (ref 0.1–1.0)
NEUTROS PCT: 61 %
Neutro Abs: 5.4 10*3/uL (ref 1.7–7.7)
Platelets: 273 10*3/uL (ref 150–400)
RBC: 4.08 MIL/uL (ref 3.87–5.11)
RDW: 13.2 % (ref 11.5–15.5)
WBC: 8.9 10*3/uL (ref 4.0–10.5)

## 2015-06-14 LAB — URINE MICROSCOPIC-ADD ON

## 2015-06-14 LAB — I-STAT CG4 LACTIC ACID, ED: LACTIC ACID, VENOUS: 1.74 mmol/L (ref 0.5–2.0)

## 2015-06-14 LAB — C-REACTIVE PROTEIN: CRP: 1.9 mg/dL — AB (ref <1.0)

## 2015-06-14 LAB — APTT: aPTT: 41 seconds — ABNORMAL HIGH (ref 24–37)

## 2015-06-14 LAB — PROTIME-INR
INR: 0.97 (ref 0.00–1.49)
Prothrombin Time: 13.1 seconds (ref 11.6–15.2)

## 2015-06-14 LAB — SEDIMENTATION RATE: Sed Rate: 90 mm/hr — ABNORMAL HIGH (ref 0–22)

## 2015-06-14 MED ORDER — ONDANSETRON HCL 4 MG/2ML IJ SOLN: 4.0000 mg | Freq: Four times a day (QID) | INTRAMUSCULAR | Status: DC | PRN

## 2015-06-14 MED ORDER — VANCOMYCIN HCL IN DEXTROSE 1-5 GM/200ML-% IV SOLN
1000.0000 mg | Freq: Once | INTRAVENOUS | Status: AC
  Administered 2015-06-14: 1000 mg via INTRAVENOUS
  Filled 2015-06-14: qty 200

## 2015-06-14 MED ORDER — LIDOCAINE-EPINEPHRINE (PF) 2 %-1:200000 IJ SOLN
20.0000 mL | Freq: Once | INTRAMUSCULAR | Status: AC
  Administered 2015-06-14: 20 mL
  Filled 2015-06-14: qty 20

## 2015-06-14 MED ORDER — PIPERACILLIN-TAZOBACTAM 3.375 G IVPB
3.3750 g | Freq: Three times a day (TID) | INTRAVENOUS | Status: DC
  Administered 2015-06-15 – 2015-06-17 (×7): 3.375 g via INTRAVENOUS
  Filled 2015-06-14 (×9): qty 50

## 2015-06-14 MED ORDER — SODIUM CHLORIDE 0.9 % IV SOLN
1500.0000 mg | INTRAVENOUS | Status: DC
  Administered 2015-06-15 – 2015-06-16 (×2): 1500 mg via INTRAVENOUS
  Filled 2015-06-14 (×3): qty 1500

## 2015-06-14 MED ORDER — ACETAMINOPHEN 650 MG RE SUPP: 650.0000 mg | Freq: Four times a day (QID) | RECTAL | Status: DC | PRN

## 2015-06-14 MED ORDER — MORPHINE SULFATE (PF) 2 MG/ML IV SOLN
2.0000 mg | INTRAVENOUS | Status: DC | PRN
  Administered 2015-06-15 – 2015-06-16 (×8): 2 mg via INTRAVENOUS
  Filled 2015-06-14 (×8): qty 1

## 2015-06-14 MED ORDER — SODIUM CHLORIDE 0.9 % IV SOLN
1000.0000 mL | Freq: Once | INTRAVENOUS | Status: AC
  Administered 2015-06-15: 1000 mL via INTRAVENOUS

## 2015-06-14 MED ORDER — VANCOMYCIN HCL 500 MG IV SOLR
500.0000 mg | Freq: Once | INTRAVENOUS | Status: DC
  Filled 2015-06-14: qty 500

## 2015-06-14 MED ORDER — ACETAMINOPHEN 325 MG PO TABS: 650.0000 mg | ORAL_TABLET | Freq: Four times a day (QID) | ORAL | Status: DC | PRN

## 2015-06-14 MED ORDER — ONDANSETRON HCL 4 MG PO TABS: 4.0000 mg | ORAL_TABLET | Freq: Four times a day (QID) | ORAL | Status: DC | PRN

## 2015-06-14 MED ORDER — POTASSIUM CHLORIDE CRYS ER 20 MEQ PO TBCR
40.0000 meq | EXTENDED_RELEASE_TABLET | Freq: Once | ORAL | Status: AC
  Administered 2015-06-14: 40 meq via ORAL
  Filled 2015-06-14: qty 2

## 2015-06-14 MED ORDER — ENOXAPARIN SODIUM 40 MG/0.4ML ~~LOC~~ SOLN: 40.0000 mg | SUBCUTANEOUS | Status: DC

## 2015-06-14 MED ORDER — OXYCODONE HCL 5 MG PO TABS
30.0000 mg | ORAL_TABLET | ORAL | Status: AC
  Administered 2015-06-14: 30 mg via ORAL
  Filled 2015-06-14 (×2): qty 6

## 2015-06-14 MED ORDER — SODIUM CHLORIDE 0.9 % IV SOLN
1000.0000 mL | INTRAVENOUS | Status: DC
  Administered 2015-06-14: 1000 mL via INTRAVENOUS

## 2015-06-14 MED ORDER — PIPERACILLIN-TAZOBACTAM 3.375 G IVPB 30 MIN
3.3750 g | Freq: Once | INTRAVENOUS | Status: AC
  Administered 2015-06-14: 3.375 g via INTRAVENOUS
  Filled 2015-06-14: qty 50

## 2015-06-14 NOTE — ED Provider Notes (Signed)
CSN: [id number]     Arrival date & time 06/14/15  1818 History   First MD Initiated Contact with Patient 06/14/15 1827     Chief Complaint  Patient presents with  . Leg Swelling     (Consider location/radiation/quality/duration/timing/severity/associated sxs/prior Treatment) HPI Comments: Carla Leonard is a 60 y.o. female with history of hypertension presents to ED with right lower extremity wound, erythema, and swelling. Patient reports falling approximately 1.5 weeks ago and sustained a laceration to her right lower extremity. Seven staples were placed. The staples were removed. However, she was noted to have developed a large blood blister and mild surrounding swelling and erythema. Her PCP drained the blood blister and the patient was placed on keflex on Thursday. She reports to ED with worsening erythema and pain. Pain is 4/10 when lying still; however, climbs to an 8/10 with movement. She describes the pain as a pressure, throbbing, burning sensation in her muscle underlying the wound. She denies fever, chills, or nightsweats. No chest pain or shortness of breath. No nausea or vomiting. No associated knee or ankle joint pain.   The history is provided by the patient and medical records.    Past Medical History  Diagnosis Date  . Hypertension   . Depression   . Arthritis   . History of kidney stones    Past Surgical History  Procedure Laterality Date  . Joint replacement      right TKA 06/2010  . Knee arthroplasty  06/17/2011    Procedure: COMPUTER ASSISTED TOTAL KNEE ARTHROPLASTY;  Surgeon: Alta Corning, MD;  Location: [facility];  Service: Orthopedics;  Laterality: Left;  TOTAL KNEE REPLACEMENT WITH GENERAL ANESTHESIA AND PRE OP FEMORAL NERVE BLOCK  . Appendectomy    . Cholecystectomy    . Elbow surgery      RIGHT  . Perianal cyst     No family history on file. Social History  Substance Use Topics  . Smoking status: Current Every Day Smoker  . Smokeless tobacco: None  .  Alcohol Use: Yes   OB History    No data available     Review of Systems  Musculoskeletal: Positive for myalgias ( right anterior lateral lower extremity ).  Skin: Positive for wound ( right anterior lateral lower extremity ).  All other systems reviewed and are negative.     Allergies  Sulfa antibiotics; Nsaids; and Other  Home Medications   Prior to Admission medications   Medication Sig Start Date End Date Taking? Authorizing Provider  ALPRAZolam Duanne Moron) 0.5 MG tablet Take 0.5 mg by mouth 2 (two) times daily as needed for anxiety.    Historical Provider, MD  Cholecalciferol (VITAMIN D3) 2000 units TABS Take 1 tablet by mouth daily.    Historical Provider, MD  furosemide (LASIX) 20 MG tablet Take 60 mg by mouth daily.     Historical Provider, MD  metoprolol succinate (TOPROL-XL) 25 MG 24 hr tablet Take 25 mg by mouth daily.    Historical Provider, MD  oxycodone (ROXICODONE) 30 MG immediate release tablet Take 30 mg by mouth every 4 (four) hours as needed. For pain    Historical Provider, MD  Oxycodone HCl (OXYCONTIN) 60 MG TB12 Take 60 mg by mouth 2 (two) times daily.     Historical Provider, MD  Oxycodone HCl 20 MG TABS Take 1 tablet by mouth every 6 (six) hours as needed.    Historical Provider, MD  Potassium Gluconate 550 MG TABS Take 550 mg by mouth  2 (two) times daily as needed (leg cramps).    Historical Provider, MD  promethazine (PHENERGAN) 25 MG tablet Take 25 mg by mouth every 6 (six) hours as needed for nausea or vomiting.    Historical Provider, MD  traZODone (DESYREL) 50 MG tablet Take 25-50 mg by mouth at bedtime.    Historical Provider, MD  venlafaxine XR (EFFEXOR-XR) 75 MG 24 hr capsule Take 225 mg by mouth daily.    Historical Provider, MD  Vortioxetine HBr 10 MG TABS Take 1 tablet by mouth daily.    Historical Provider, MD  warfarin (COUMADIN) 5 MG tablet Take 5mg /day, unless otherwise directed, goal INR is 2.0 Patient not taking: Reported on 05/27/2015 06/19/11    Phylliss Bob, MD   BP 108/80 mmHg  Pulse 135  Temp(Src) 98.8 F (37.1 C) (Oral)  Resp 20  SpO2 95% Physical Exam  Constitutional: She appears well-developed and well-nourished. No distress.  HENT:  Head: Normocephalic and atraumatic.  Mouth/Throat: Oropharynx is clear and moist. No oropharyngeal exudate.  Eyes: Conjunctivae and EOM are normal. Pupils are equal, round, and reactive to light. Right eye exhibits no discharge. Left eye exhibits no discharge. No scleral icterus.  Neck: Normal range of motion. Neck supple.  Cardiovascular: Normal rate, regular rhythm, normal heart sounds and intact distal pulses.   No murmur heard. DP 2+ b/l  Pulmonary/Chest: Effort normal and breath sounds normal. No respiratory distress.  Abdominal: Soft. Bowel sounds are normal. There is no tenderness. There is no rebound and no guarding.  Musculoskeletal: Normal range of motion.  Swelling and erythema of right lower extremity. TTP of right lateral anterior lower extremity to palpation including tibia. No TTP of posterior lower extremity. Compartment is soft.  Lymphadenopathy:    She has no cervical adenopathy.  Neurological: She is alert. Coordination normal.  Mild decrease in sensation on lateral aspect of right foot compared to left.   Skin: Skin is warm and dry. She is not diaphoretic.     Psychiatric: She has a normal mood and affect. Her behavior is normal.    ED Course  .Marland KitchenIncision and Drainage Date/Time: 06/14/2015 9:50 PM Performed by: Isla Pence Authorized by: Isla Pence Consent: Verbal consent obtained. Risks and benefits: risks, benefits and alternatives were discussed Consent given by: patient Patient understanding: patient states understanding of the procedure being performed Patient consent: the patient's understanding of the procedure matches consent given Procedure consent: procedure consent matches procedure scheduled Relevant documents: relevant documents present  and verified Test results: test results available and properly labeled Site marked: the operative site was marked Imaging studies: imaging studies available Required items: required blood products, implants, devices, and special equipment available Patient identity confirmed: verbally with patient Time out: Immediately prior to procedure a "time out" was called to verify the correct patient, procedure, equipment, support staff and site/side marked as required. Type: hematoma Body area: lower extremity Location details: right leg Anesthesia: local infiltration Local anesthetic: lidocaine 2% with epinephrine Anesthetic total: 3 ml Patient sedated: no Risk factor: underlying major vessel Scalpel size: 11 Incision type: single straight Incision depth: dermal Drainage: bloody Drainage amount: moderate Wound treatment: wound left open Patient tolerance: Patient tolerated the procedure well with no immediate complications   (including critical care time) Labs Review Labs Reviewed  COMPREHENSIVE METABOLIC PANEL - Abnormal; Notable for the following:    Potassium 2.7 (*)    Chloride 92 (*)    CO2 33 (*)    Glucose, Bld 108 (*)  BUN 27 (*)    Creatinine, Ser 1.28 (*)    Albumin 3.3 (*)    Alkaline Phosphatase 163 (*)    GFR calc non Af Amer 45 (*)    GFR calc Af Amer 52 (*)    All other components within normal limits  APTT - Abnormal; Notable for the following:    aPTT 41 (*)    All other components within normal limits  C-REACTIVE PROTEIN - Abnormal; Notable for the following:    CRP 1.9 (*)    All other components within normal limits  CULTURE, BLOOD (ROUTINE X 2)  CULTURE, BLOOD (ROUTINE X 2)  URINE CULTURE  AEROBIC CULTURE (SUPERFICIAL SPECIMEN)  CBC WITH DIFFERENTIAL/PLATELET  PROTIME-INR  URINALYSIS, ROUTINE W REFLEX MICROSCOPIC (NOT AT [facility])  SEDIMENTATION RATE  I-STAT CG4 LACTIC ACID, ED  I-STAT CG4 LACTIC ACID, ED    Imaging Review Dg Tibia/fibula  Right  06/14/2015  CLINICAL DATA:  Acute onset of tenderness to palpation along the right tibia, with blood blisters and cellulitis. Initial encounter. EXAM: RIGHT TIBIA AND FIBULA - 2 VIEW COMPARISON:  Right lower leg radiographs performed 06/03/2015 FINDINGS: There is no evidence of osseous disruption. The tibia and fibula appear grossly intact. 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 A small knee joint effusion is noted. Mild soft tissue swelling is noted about the lower leg and ankle. The ankle mortise is grossly unremarkable in appearance. IMPRESSION: No evidence of osseous disruption. Small knee joint effusion noted. 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 Electronically Signed   By: Garald Balding M.D.   On: 06/14/2015 20:16   I have personally reviewed and evaluated these images and lab results as part of my medical decision-making.   EKG Interpretation None      MDM  PT D/W DR. Harvest Forest ([facility]) FOR ADMISSION.  HE WILL ADMIT PT FOR OBS. Final diagnoses:  Cellulitis of right lower extremity  Failure of outpatient treatment  Hypokalemia  Renal insufficiency   Patient is afebrile and non-toxic appearing. Her vital signs are stable. Physical exam remarkable for 6cm blood blister on anterior middle right lower extremity with surrounding erythema spreading to right ankle. She has associated TTP. Compartment is soft. Concern for soft tissue infection vs. osteomyelitis. Code sepsis orders initiated. IV ABX and fluids initiated. DG of right lower extremity to r/o bony involvement.    Patient has failed outpatient treatment. Suspect patient will need to be admitted for IV ABX. Discussed patient with Dr. Gilford Raid, who has assumed care at shift change, and agrees with plan.     Roxanna Mew, [place] 06/14/15 2122  Isla Pence, MD 06/14/15 2159

## 2015-06-14 NOTE — H&P (Addendum)
History and Physical    Carla Leonard [id number] DOB: Dec 10, 1955 DOA: 06/14/2015  Referring MD/NP/PA: Dr. Gilford Raid PCP: Bonnita Nasuti, MD  Patient coming from: Home  Chief Complaint: Right leg redness, swelling, and pain  HPI: Carla Leonard is a 60 y.o. female with medical history significant of HTN, anxiety/depression, tobacco abuse, nephrolithiasis, and arthritis; who presents with complaints of worsening right leg redness and swelling.  Patient recently fell all at her home after being spooked by a cat sustaining a laceration to the right lower leg on 5/31. She was seen in emergency department at that time and received 7 staples. She followed up with her primary care doctor to have the staples taken out and was placed on Keflex as the wound look to be infected and given another diuretic for swelling . Subsequently, 2 days ago she followed up with her PCP and had to have a pocket of dark blood evacuated. She complains of extension of the redness, pain, and reformation of the pocket of fluid. Patient notes that she took the new diuretic for approximately 2-3 days and it seemingly reduced swelling in her lower legs and therefore stopped it because she's been done with issues with her potassium.  ED Course: Upon admission to the emergency department patient was evaluated and seen to be afebrile with all other vitals noted within normal limits. Lab work included a CBC which was unremarkable, potassium 2.7, chloride 92, CO2 33, BUN 27, creatinine 1.28, lactic acid 1.74, INR 0.97, aPTT 41, and CRP elevated at 1.9. In the ED the patient underwent I&D was described as evacuation of a hematoma of the right leg. Patient was also given antibiotics of vancomycin and Zosyn as well as 40 mEq of potassium chloride in the ED  Review of Systems: As per HPI otherwise 10 point review of systems negative.   Past Medical History  Diagnosis Date  . Hypertension   . Depression   . Arthritis   . History of  kidney stones     Past Surgical History  Procedure Laterality Date  . Joint replacement      right TKA 06/2010  . Knee arthroplasty  06/17/2011    Procedure: COMPUTER ASSISTED TOTAL KNEE ARTHROPLASTY;  Surgeon: Alta Corning, MD;  Location: [facility];  Service: Orthopedics;  Laterality: Left;  TOTAL KNEE REPLACEMENT WITH GENERAL ANESTHESIA AND PRE OP FEMORAL NERVE BLOCK  . Appendectomy    . Cholecystectomy    . Elbow surgery      RIGHT  . Perianal cyst       reports that she has been smoking.  She does not have any smokeless tobacco history on file. She reports that she drinks alcohol. She reports that she does not use illicit drugs.  Allergies  Allergen Reactions  . Sulfa Antibiotics Anaphylaxis  . Nsaids Other (See Comments)    Stomach pain, diarrhea  . Other Other (See Comments)    Steroids: hallucinations    No family history on file.  Prior to Admission medications   Medication Sig Start Date End Date Taking? Authorizing Provider  ALPRAZolam Duanne Moron) 0.5 MG tablet Take 0.5 mg by mouth 2 (two) times daily as needed for anxiety.    Historical Provider, MD  Cholecalciferol (VITAMIN D3) 2000 units TABS Take 1 tablet by mouth daily.    Historical Provider, MD  furosemide (LASIX) 20 MG tablet Take 60 mg by mouth daily.     Historical Provider, MD  metoprolol succinate (TOPROL-XL) 25 MG 24  hr tablet Take 25 mg by mouth daily.    Historical Provider, MD  oxycodone (ROXICODONE) 30 MG immediate release tablet Take 30 mg by mouth every 4 (four) hours as needed. For pain    Historical Provider, MD  Oxycodone HCl (OXYCONTIN) 60 MG TB12 Take 60 mg by mouth 2 (two) times daily.     Historical Provider, MD  Oxycodone HCl 20 MG TABS Take 1 tablet by mouth every 6 (six) hours as needed.    Historical Provider, MD  Potassium Gluconate 550 MG TABS Take 550 mg by mouth 2 (two) times daily as needed (leg cramps).    Historical Provider, MD  promethazine (PHENERGAN) 25 MG tablet Take 25 mg by mouth  every 6 (six) hours as needed for nausea or vomiting.    Historical Provider, MD  traZODone (DESYREL) 50 MG tablet Take 25-50 mg by mouth at bedtime.    Historical Provider, MD  venlafaxine XR (EFFEXOR-XR) 75 MG 24 hr capsule Take 225 mg by mouth daily.    Historical Provider, MD  Vortioxetine HBr 10 MG TABS Take 1 tablet by mouth daily.    Historical Provider, MD  warfarin (COUMADIN) 5 MG tablet Take 5mg /day, unless otherwise directed, goal INR is 2.0 Patient not taking: Reported on 05/27/2015 06/19/11   Phylliss Bob, MD    Physical Exam: Filed Vitals:   06/14/15 1824 06/14/15 2015 06/14/15 2045  BP: 137/73 117/70 109/73  Pulse: 79 65 64  Temp: 98.8 F (37.1 C)    TempSrc: Oral    Resp: 20 20   SpO2: 98% 99% 100%      Constitutional: Obese female in mild distress able to get comfortable in the hospital gurney Filed Vitals:   06/14/15 1824 06/14/15 2015 06/14/15 2045  BP: 137/73 117/70 109/73  Pulse: 79 65 64  Temp: 98.8 F (37.1 C)    TempSrc: Oral    Resp: 20 20   SpO2: 98% 99% 100%   Eyes: PERRL, lids and conjunctivae normal ENMT: Mucous membranes are moist. Posterior pharynx clear of any exudate or lesions.Normal dentition.  Neck: normal, supple, no masses, no thyromegaly Respiratory: clear to auscultation bilaterally, no wheezing, no crackles. Normal respiratory effort. No accessory muscle use.  Cardiovascular: Regular rate and rhythm, no murmurs / rubs / gallops. 1+ pitting extremity edema. 2+ pedal pulses. No carotid bruits.  Abdomen: no tenderness, no masses palpated. No hepatosplenomegaly. Bowel sounds positive.  Musculoskeletal: no clubbing / cyanosis. No joint deformity upper and lower extremities. Good ROM, no contractures. Normal muscle tone.  Skin: Right leg swelling and erythema with opened hematoma as seen below    Neurologic: CN 2-12 grossly intact. Sensation intact, DTR normal. Strength 5/5 in all 4.  Psychiatric: Normal judgment and insight. Alert and  oriented x 3. Normal mood.     Labs on Admission: I have personally reviewed following labs and imaging studies  CBC:  Recent Labs Lab 06/14/15 2027  WBC 8.9  NEUTROABS 5.4  HGB 12.9  HCT 38.4  MCV 94.1  PLT 123456   Basic Metabolic Panel:  Recent Labs Lab 06/14/15 2027  NA 138  K 2.7*  CL 92*  CO2 33*  GLUCOSE 108*  BUN 27*  CREATININE 1.28*  CALCIUM 8.9   GFR: CrCl cannot be calculated (Unknown ideal weight.). Liver Function Tests:  Recent Labs Lab 06/14/15 2027  AST 24  ALT 23  ALKPHOS 163*  BILITOT 0.4  PROT 6.9  ALBUMIN 3.3*   No results for input(s): LIPASE,  AMYLASE in the last 168 hours. No results for input(s): AMMONIA in the last 168 hours. Coagulation Profile:  Recent Labs Lab 06/14/15 2027  INR 0.97   Cardiac Enzymes: No results for input(s): CKTOTAL, CKMB, CKMBINDEX, TROPONINI in the last 168 hours. BNP (last 3 results) No results for input(s): PROBNP in the last 8760 hours. HbA1C: No results for input(s): HGBA1C in the last 72 hours. CBG: No results for input(s): GLUCAP in the last 168 hours. Lipid Profile: No results for input(s): CHOL, HDL, LDLCALC, TRIG, CHOLHDL, LDLDIRECT in the last 72 hours. Thyroid Function Tests: No results for input(s): TSH, T4TOTAL, FREET4, T3FREE, THYROIDAB in the last 72 hours. Anemia Panel: No results for input(s): VITAMINB12, FOLATE, FERRITIN, TIBC, IRON, RETICCTPCT in the last 72 hours. Urine analysis:    Component Value Date/Time   COLORURINE YELLOW 06/17/2011 0636   APPEARANCEUR CLOUDY* 06/17/2011 0636   LABSPEC 1.023 06/17/2011 0636   PHURINE 5.5 06/17/2011 0636   GLUCOSEU NEGATIVE 06/17/2011 0636   HGBUR NEGATIVE 06/17/2011 0636   BILIRUBINUR NEGATIVE 06/17/2011 0636   KETONESUR NEGATIVE 06/17/2011 0636   PROTEINUR NEGATIVE 06/17/2011 0636   UROBILINOGEN 0.2 06/17/2011 0636   NITRITE NEGATIVE 06/17/2011 0636   LEUKOCYTESUR TRACE* 06/17/2011 0636   Sepsis Labs: No results found for this  or any previous visit (from the past 240 hour(s)).   Radiological Exams on Admission: Dg Tibia/fibula Right  06/14/2015  CLINICAL DATA:  Acute onset of tenderness to palpation along the right tibia, with blood blisters and cellulitis. Initial encounter. EXAM: RIGHT TIBIA AND FIBULA - 2 VIEW COMPARISON:  Right lower leg radiographs performed 06/03/2015 FINDINGS: There is no evidence of osseous disruption. The tibia and fibula appear grossly intact. 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 A small knee joint effusion is noted. Mild soft tissue swelling is noted about the lower leg and ankle. The ankle mortise is grossly unremarkable in appearance. IMPRESSION: No evidence of osseous disruption. Small knee joint effusion noted. 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 Electronically Signed   By: Garald Balding M.D.   On: 06/14/2015 20:16     Assessment/Plan Suspected cellulitis of the right lower extremity with hematoma: Acute. Patient with recent fall sustaining a laceration to the right lower extremity. Subsequent redness and swelling placed on Keflex by PCP without relief of symptoms. - admit MedSurg bed - Follow-up cultures - Empiric antibiotics of vancomycin and Zosyn, de-escalate when able - Wound outlined for monitoring of treatment - Wound care consult - IV fluids normal saline at 138ml/hr  Hypokalemia: Acute. Potassium on admission 2.7. Patient reports recent increase in Lasix without potassium supplementation. Given potassium chloride 40 mEq in the ED. - Given additional 40 mEq of potassium chloride  - Monitor and replace as needed. - Patient likely needs to be placed on oral supplementation if she continues her diuretics  Suspected acute kidney injury: Baseline creatinine previously had been less than 1. On presentation creatinine elevated at 1.28 with a BUN of 27. Patient reports increase in diuretics recently. - check FeUr -  Follow-up repeat BMP in a.m.  Essential hypertension  - Continue metoprolol - Restart furosemide medically acceptable to do so.  Anxiety/depression - Continue Xanax prn  Elevated APTT: Question cause. Also appears Coumadin was on patient's medication list previously  Chronic back pain - Continue oxycodone  DVT prophylaxis: Will need to place on DVT prophylaxis Code Status: Full  Family Communication: none Disposition Plan: Possible discharge home in 2-3 day Consults  called: None Admission status: Observation MedSurg  Norval Morton MD [facility] Pager [phone]  If 7PM-7AM, please contact night-coverage [email or web address] Password [facility]  06/14/2015, 9:55 PM

## 2015-06-14 NOTE — ED Notes (Signed)
Patient here with right lower leg swelling and redness with fluid weeping from wound after falling 10 days ago. Staples were removed friday

## 2015-06-14 NOTE — Progress Notes (Signed)
Pharmacy Antibiotic Note  BRIXTON GALLIPEAU is a 60 y.o. female admitted on 06/14/2015 with cellulitis.  Pharmacy has been consulted for Vancomycin/Zosyn dosing. WBC WNL. Mild bump in Scr.   Plan: -Vancomycin 1500 mg IV q24h -Zosyn 3.375G IV q8h to be infused over 4 hours -Trend WBC, temp, renal function  -Drug levels as indicated   Temp (24hrs), Avg:98.8 F (37.1 C), Min:98.8 F (37.1 C), Max:98.8 F (37.1 C)   Recent Labs Lab 06/14/15 2027 06/14/15 2042  WBC 8.9  --   CREATININE 1.28*  --   LATICACIDVEN  --  1.74    CrCl cannot be calculated (Unknown ideal weight.).    Allergies  Allergen Reactions  . Sulfa Antibiotics Anaphylaxis  . Nsaids Other (See Comments)    Stomach pain, diarrhea  . Other Other (See Comments)    Steroids: hallucinations   Narda Bonds 06/14/2015 10:40 PM

## 2015-06-14 NOTE — ED Notes (Signed)
Dr. Smith at the bedside.  

## 2015-06-14 NOTE — ED Notes (Signed)
PA at bedside.

## 2015-06-15 ENCOUNTER — Encounter (HOSPITAL_COMMUNITY): Payer: Self-pay

## 2015-06-15 DIAGNOSIS — G8929 Other chronic pain: Secondary | ICD-10-CM | POA: Diagnosis present

## 2015-06-15 DIAGNOSIS — N179 Acute kidney failure, unspecified: Secondary | ICD-10-CM | POA: Diagnosis not present

## 2015-06-15 DIAGNOSIS — M549 Dorsalgia, unspecified: Secondary | ICD-10-CM | POA: Diagnosis present

## 2015-06-15 DIAGNOSIS — F419 Anxiety disorder, unspecified: Secondary | ICD-10-CM | POA: Diagnosis present

## 2015-06-15 DIAGNOSIS — E876 Hypokalemia: Secondary | ICD-10-CM

## 2015-06-15 DIAGNOSIS — T148XXA Other injury of unspecified body region, initial encounter: Secondary | ICD-10-CM

## 2015-06-15 DIAGNOSIS — Z79899 Other long term (current) drug therapy: Secondary | ICD-10-CM | POA: Diagnosis not present

## 2015-06-15 DIAGNOSIS — N289 Disorder of kidney and ureter, unspecified: Secondary | ICD-10-CM

## 2015-06-15 DIAGNOSIS — Z96653 Presence of artificial knee joint, bilateral: Secondary | ICD-10-CM | POA: Diagnosis present

## 2015-06-15 DIAGNOSIS — N189 Chronic kidney disease, unspecified: Secondary | ICD-10-CM | POA: Diagnosis present

## 2015-06-15 DIAGNOSIS — L03115 Cellulitis of right lower limb: Secondary | ICD-10-CM | POA: Insufficient documentation

## 2015-06-15 DIAGNOSIS — F17200 Nicotine dependence, unspecified, uncomplicated: Secondary | ICD-10-CM | POA: Diagnosis present

## 2015-06-15 DIAGNOSIS — F329 Major depressive disorder, single episode, unspecified: Secondary | ICD-10-CM | POA: Diagnosis present

## 2015-06-15 DIAGNOSIS — Z79891 Long term (current) use of opiate analgesic: Secondary | ICD-10-CM | POA: Diagnosis not present

## 2015-06-15 DIAGNOSIS — I10 Essential (primary) hypertension: Secondary | ICD-10-CM | POA: Diagnosis present

## 2015-06-15 DIAGNOSIS — Z7901 Long term (current) use of anticoagulants: Secondary | ICD-10-CM | POA: Diagnosis not present

## 2015-06-15 DIAGNOSIS — F418 Other specified anxiety disorders: Secondary | ICD-10-CM | POA: Diagnosis not present

## 2015-06-15 LAB — MRSA PCR SCREENING: MRSA by PCR: NEGATIVE

## 2015-06-15 LAB — BASIC METABOLIC PANEL
Anion gap: 7 (ref 5–15)
BUN: 23 mg/dL — AB (ref 6–20)
CHLORIDE: 100 mmol/L — AB (ref 101–111)
CO2: 34 mmol/L — AB (ref 22–32)
Calcium: 8.7 mg/dL — ABNORMAL LOW (ref 8.9–10.3)
Creatinine, Ser: 1.18 mg/dL — ABNORMAL HIGH (ref 0.44–1.00)
GFR calc Af Amer: 57 mL/min — ABNORMAL LOW (ref >60)
GFR calc non Af Amer: 49 mL/min — ABNORMAL LOW (ref >60)
GLUCOSE: 126 mg/dL — AB (ref 65–99)
POTASSIUM: 3.1 mmol/L — AB (ref 3.5–5.1)
Sodium: 141 mmol/L (ref 135–145)

## 2015-06-15 LAB — CBC
HEMATOCRIT: 35.7 % — AB (ref 36.0–46.0)
HEMOGLOBIN: 11.5 g/dL — AB (ref 12.0–15.0)
MCH: 30.5 pg (ref 26.0–34.0)
MCHC: 32.2 g/dL (ref 30.0–36.0)
MCV: 94.7 fL (ref 78.0–100.0)
Platelets: 257 10*3/uL (ref 150–400)
RBC: 3.77 MIL/uL — ABNORMAL LOW (ref 3.87–5.11)
RDW: 13.3 % (ref 11.5–15.5)
WBC: 9.6 10*3/uL (ref 4.0–10.5)

## 2015-06-15 LAB — CREATININE, URINE, RANDOM: Creatinine, Urine: 100.37 mg/dL

## 2015-06-15 MED ORDER — TRAZODONE HCL 50 MG PO TABS
25.0000 mg | ORAL_TABLET | Freq: Every day | ORAL | Status: DC
  Administered 2015-06-15 – 2015-06-16 (×2): 25 mg via ORAL
  Filled 2015-06-15 (×2): qty 1

## 2015-06-15 MED ORDER — LIDOCAINE HCL (PF) 1 % IJ SOLN
INTRAMUSCULAR | Status: AC
  Filled 2015-06-15: qty 30

## 2015-06-15 MED ORDER — METOLAZONE 2.5 MG PO TABS
2.5000 mg | ORAL_TABLET | Freq: Every day | ORAL | Status: DC
  Administered 2015-06-15 – 2015-06-17 (×3): 2.5 mg via ORAL
  Filled 2015-06-15 (×3): qty 1

## 2015-06-15 MED ORDER — POTASSIUM CHLORIDE CRYS ER 20 MEQ PO TBCR
40.0000 meq | EXTENDED_RELEASE_TABLET | ORAL | Status: AC
  Administered 2015-06-15: 40 meq via ORAL
  Filled 2015-06-15: qty 2

## 2015-06-15 MED ORDER — OXYCODONE HCL 5 MG PO TABS
30.0000 mg | ORAL_TABLET | Freq: Four times a day (QID) | ORAL | Status: DC | PRN
  Administered 2015-06-15 – 2015-06-17 (×6): 30 mg via ORAL
  Filled 2015-06-15 (×7): qty 6

## 2015-06-15 MED ORDER — METOPROLOL SUCCINATE ER 25 MG PO TB24
25.0000 mg | ORAL_TABLET | Freq: Every day | ORAL | Status: DC
  Administered 2015-06-17: 25 mg via ORAL
  Filled 2015-06-15 (×3): qty 1

## 2015-06-15 MED ORDER — ALPRAZOLAM 0.5 MG PO TABS
0.5000 mg | ORAL_TABLET | Freq: Two times a day (BID) | ORAL | Status: DC | PRN
  Administered 2015-06-15 – 2015-06-17 (×4): 0.5 mg via ORAL
  Filled 2015-06-15 (×4): qty 1

## 2015-06-15 NOTE — Consult Note (Addendum)
WOC wound consult note Reason for Consult: Consult requested for right leg hematoma. A limited I&D was performed in the ER, according to the EMR. Wound type: Full thickness wound to right calf; related to an abrasion which previously occurred. Measurement: 9.5X7cm Wound bed: 100% dark purple clotted blood Drainage (amount, consistency, odor) Scant amt dark red old blood has oozed out of the previous incision site in the middle of the wound bed. Periwound: Generalized erythremia surrounding the location. Dressing procedure/placement/frequency: Pt could benefit from further sharp debridement of nonviable tissue; recommend ortho consult for further plan of care. Discussed plan of care with primary team via phone call. Please re-consult if further assistance is needed.  Thank-you,  Julien Girt MSN, [place], [place], [place], [place]

## 2015-06-15 NOTE — Progress Notes (Signed)
PROGRESS NOTE   Carla Leonard  [id number]  DOB: Jan 30, 1955  DOA: 06/14/2015 PCP: Bonnita Nasuti, MD Outpatient Specialists:  Hospital course: Carla Leonard is a 60 y.o. female with medical history significant of HTN, anxiety/depression, tobacco abuse, nephrolithiasis, and arthritis; who presents with complaints of worsening right leg redness and swelling. Patient recently fell all at her home after being spooked by a cat sustaining a laceration to the right lower leg on 5/31. She was seen in emergency department at that time and received 7 staples. She followed up with her primary care doctor to have the staples taken out and was placed on Keflex as the wound look to be infected and given another diuretic for swelling . Subsequently, 2 days ago she followed up with her PCP and had to have a pocket of dark blood evacuated. She complains of extension of the redness, pain, and reformation of the pocket of fluid. Patient notes that she took the new diuretic for approximately 2-3 days and it seemingly reduced swelling in her lower legs and therefore stopped it because she's been done with issues with her potassium.  ED Course: Upon admission to the emergency department patient was evaluated and seen to be afebrile with all other vitals noted within normal limits. Lab work included a CBC which was unremarkable, potassium 2.7, chloride 92, CO2 33, BUN 27, creatinine 1.28, lactic acid 1.74, INR 0.97, aPTT 41, and CRP elevated at 1.9. In the ED the patient underwent I&D was described as evacuation of a hematoma of the right leg. Patient was also given antibiotics of vancomycin and Zosyn as well as 40 mEq of potassium chloride in the ED   Assessment & Plan:   Suspected cellulitis of the right lower extremity with large hematoma: Acute. Patient with recent fall sustaining a laceration to the right lower extremity. Subsequent redness and swelling placed on Keflex by PCP without relief of symptoms. -  admit MedSurg bed - Follow-up cultures - Empiric antibiotics of vancomycin and Zosyn, de-escalate when able - Will consult [facility] for evaluation: Pt says she had her knee surgeries done by Dr. Berenice Primas.  - Wound outlined for monitoring of treatment - Wound care consult recommended orthopedics consult  Hypokalemia: Acute. Potassium on admission 2.7. Patient reports recent increase in Lasix without potassium supplementation. Given potassium chloride 40 mEq in the ED. - Given additional 40 mEq of potassium chloride  - Monitor and replace as needed. - Patient likely needs to be placed on oral supplementation if she continues her diuretics  Suspected acute kidney injury: Baseline creatinine previously had been less than 1. On presentation creatinine elevated at 1.28 with a BUN of 27. Patient reports increase in diuretics recently. - check FeUr - Follow-up repeat BMP - improved with IVFs  Essential hypertension  - Continue metoprolol - Holding furosemide for now.  Anxiety/depression - Continue Xanax prn  Elevated APTT: Question cause. Also appears Coumadin was on patient's medication list previously  Chronic back pain - Continue oxycodone  Consultants:  [facility]  Procedures:  Antimicrobials: Anti-infectives    Start     Dose/Rate Route Frequency Ordered Stop   06/15/15 2200  vancomycin (VANCOCIN) 1,500 mg in sodium chloride 0.9 % 500 mL IVPB     1,500 mg 250 mL/hr over 120 Minutes Intravenous Every 24 hours 06/14/15 2245     06/15/15 0600  piperacillin-tazobactam (ZOSYN) IVPB 3.375 g     3.375 g 12.5 mL/hr over 240 Minutes Intravenous Every 8 hours 06/14/15  2245     06/14/15 2300  vancomycin (VANCOCIN) 500 mg in sodium chloride 0.9 % 100 mL IVPB     500 mg 100 mL/hr over 60 Minutes Intravenous  Once 06/14/15 2245     06/14/15 1930  piperacillin-tazobactam (ZOSYN) IVPB 3.375 g     3.375 g 100 mL/hr over 30 Minutes Intravenous  Once 06/14/15 1920  06/14/15 2058   06/14/15 1930  vancomycin (VANCOCIN) IVPB 1000 mg/200 mL premix     1,000 mg 200 mL/hr over 60 Minutes Intravenous  Once 06/14/15 1920 06/14/15 2246       Subjective: Pt says that pain persists and is afraid she is going to lose her right leg  Objective: Filed Vitals:   06/14/15 2145 06/14/15 2256 06/15/15 0120 06/15/15 0532  BP: 108/80 121/88  103/55  Pulse:  76  66  Temp:  98.8 F (37.1 C)  98.6 F (37 C)  TempSrc:  Oral  Oral  Resp: 20 18  18   Height:  5\' 2"  (1.575 m)    Weight:  277 lb 1.9 oz (125.7 kg) 271 lb 8 oz (123.152 kg)   SpO2: 95% 100%  100%    Intake/Output Summary (Last 24 hours) at 06/15/15 0951 Last data filed at 06/15/15 0900  Gross per 24 hour  Intake    240 ml  Output      0 ml  Net    240 ml   Filed Weights   06/14/15 2256 06/15/15 0120  Weight: 277 lb 1.9 oz (125.7 kg) 271 lb 8 oz (123.152 kg)   Exam: Eyes: PERRL, lids and conjunctivae normal ENMT: Mucous membranes are moist. Posterior pharynx clear of any exudate or lesions.Normal dentition.  Neck: normal, supple, no masses, no thyromegaly Respiratory: clear to auscultation bilaterally, no wheezing, no crackles. Normal respiratory effort. No accessory muscle use.  Cardiovascular: Regular rate and rhythm, no murmurs / rubs / gallops. 1+ pitting extremity edema. 2+ pedal pulses. No carotid bruits.  Abdomen: no tenderness, no masses palpated. No hepatosplenomegaly. Bowel sounds positive.  Musculoskeletal: no clubbing / cyanosis. No joint deformity upper and lower extremities. Good ROM, no contractures. Normal muscle tone.  Skin: Right leg swelling and erythema with opened hematoma as seen below    Data Reviewed: Basic Metabolic Panel:  Recent Labs Lab 06/14/15 2027 06/15/15 0307  NA 138 141  K 2.7* 3.1*  CL 92* 100*  CO2 33* 34*  GLUCOSE 108* 126*  BUN 27* 23*  CREATININE 1.28* 1.18*  CALCIUM 8.9 8.7*   Liver Function Tests:  Recent Labs Lab 06/14/15 2027    AST 24  ALT 23  ALKPHOS 163*  BILITOT 0.4  PROT 6.9  ALBUMIN 3.3*   No results for input(s): LIPASE, AMYLASE in the last 168 hours. No results for input(s): AMMONIA in the last 168 hours. CBC:  Recent Labs Lab 06/14/15 2027 06/15/15 0307  WBC 8.9 9.6  NEUTROABS 5.4  --   HGB 12.9 11.5*  HCT 38.4 35.7*  MCV 94.1 94.7  PLT 273 257   Cardiac Enzymes: No results for input(s): CKTOTAL, CKMB, CKMBINDEX, TROPONINI in the last 168 hours. BNP (last 3 results) No results for input(s): PROBNP in the last 8760 hours. CBG: No results for input(s): GLUCAP in the last 168 hours.  Recent Results (from the past 240 hour(s))  Wound or Superficial Culture     Status: None (Preliminary result)   Collection Time: 06/14/15  9:56 PM  Result Value Ref Range Status  Specimen Description WOUND RIGHT LEG  Final   Special Requests NONE  Final   Gram Stain RARE WBC SEEN NO ORGANISMS SEEN   Final   Culture PENDING  Incomplete   Report Status PENDING  Incomplete  MRSA PCR Screening     Status: None   Collection Time: 06/15/15  2:05 AM  Result Value Ref Range Status   MRSA by PCR NEGATIVE NEGATIVE Final    Comment:        The GeneXpert MRSA Assay (FDA approved for NASAL specimens only), is one component of a comprehensive MRSA colonization surveillance program. It is not intended to diagnose MRSA infection nor to guide or monitor treatment for MRSA infections. Performed at [facility]     Studies: Dg Tibia/fibula Right  06/14/2015  CLINICAL DATA:  Acute onset of tenderness to palpation along the right tibia, with blood blisters and cellulitis. Initial encounter. EXAM: RIGHT TIBIA AND FIBULA - 2 VIEW COMPARISON:  Right lower leg radiographs performed 06/03/2015 FINDINGS: There is no evidence of osseous disruption. The tibia and fibula appear grossly intact. 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 A small knee joint effusion is  noted. Mild soft tissue swelling is noted about the lower leg and ankle. The ankle mortise is grossly unremarkable in appearance. IMPRESSION: No evidence of osseous disruption. Small knee joint effusion noted. 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 Electronically Signed   By: Garald Balding M.D.   On: 06/14/2015 20:16   Scheduled Meds: . piperacillin-tazobactam (ZOSYN)  IV  3.375 g Intravenous Q8H  . vancomycin  1,500 mg Intravenous Q24H  . vancomycin  500 mg Intravenous Once   Continuous Infusions:   Principal Problem:   Cellulitis of right leg Active Problems:   Hematoma   Essential hypertension   Acute kidney injury ([place])   Anxiety and depression  Time spent:   Irwin Brakeman, MD, FAAFP [facility] Pager [phone]  If 7PM-7AM, please contact night-coverage [email or web address] Password [facility] 06/15/2015, 9:51 AM

## 2015-06-15 NOTE — Care Management Obs Status (Signed)
[facility] NOTIFICATION   Patient Details  Name: Carla Leonard MRN: [id number] Date of Birth: 1955/02/16   Medicare Observation Status Notification Given:  Yes    Ninfa Meeker, RN 06/15/2015, 10:39 AM

## 2015-06-15 NOTE — Consult Note (Signed)
Reason for Consult:hematoma versus cellulitis right leg Referring Physician: hospitalists  Carla Leonard is an 60 y.o. female.  HPI: the patient is a 60 year old female who tripped and fell against a fireplace with sounds like several weeks ago. She was seen in emergency room where she was sewn up. She continues to have pain and swelling in the leg. She represented to the emergency room where they made a small incision over the leg and try to get some fluid out. There were unsuccessful in getting any significant drainage from the leg. She was admitted to the hospitalist service for concern of cellulitis and we are consult for evaluation and management of the right leg swelling. 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 The knees have function well for her.  She denies numbness tingling or radiating pain down the legs.  Past Medical History  Diagnosis Date  . Hypertension   . Depression   . Arthritis   . History of kidney stones     Past Surgical History  Procedure Laterality Date  . Joint replacement      right TKA 06/2010  . Knee arthroplasty  06/17/2011    Procedure: COMPUTER ASSISTED TOTAL KNEE ARTHROPLASTY;  Surgeon: Alta Corning, MD;  Location: [place];  Service: Orthopedics;  Laterality: Left;  TOTAL KNEE REPLACEMENT WITH GENERAL ANESTHESIA AND PRE OP FEMORAL NERVE BLOCK  . Appendectomy    . Cholecystectomy    . Elbow surgery      RIGHT  . Perianal cyst      History reviewed. No pertinent family history.  Social History:  reports that she has been smoking.  She does not have any smokeless tobacco history on file. She reports that she drinks alcohol. She reports that she does not use illicit drugs.  Allergies:  Allergies  Allergen Reactions  . Sulfa Antibiotics Anaphylaxis  . Nsaids Other (See Comments)    Stomach pain, diarrhea  . Other Other (See Comments)    Steroids: hallucinations    Medications: I have reviewed the patient's current  medications.  Results for orders placed or performed during the hospital encounter of 06/14/15 (from the past 48 hour(s))  Blood Culture (routine x 2)     Status: None (Preliminary result)   Collection Time: 06/14/15  8:20 PM  Result Value Ref Range   Specimen Description BLOOD LEFT HAND    Special Requests BOTTLES DRAWN AEROBIC ONLY 6CC    Culture NO GROWTH < 24 HOURS    Report Status PENDING   Comprehensive metabolic panel     Status: Abnormal   Collection Time: 06/14/15  8:27 PM  Result Value Ref Range   Sodium 138 135 - 145 mmol/L   Potassium 2.7 (LL) 3.5 - 5.1 mmol/L    Comment: CRITICAL RESULT CALLED TO, READ BACK BY AND VERIFIED WITH: STRAUGHAN C,RN 06/14/15 2119 WAYK    Chloride 92 (L) 101 - 111 mmol/L   CO2 33 (H) 22 - 32 mmol/L   Glucose, Bld 108 (H) 65 - 99 mg/dL   BUN 27 (H) 6 - 20 mg/dL   Creatinine, Ser 1.28 (H) 0.44 - 1.00 mg/dL   Calcium 8.9 8.9 - 10.3 mg/dL   Total Protein 6.9 6.5 - 8.1 g/dL   Albumin 3.3 (L) 3.5 - 5.0 g/dL   AST 24 15 - 41 U/L   ALT 23 14 - 54 U/L   Alkaline Phosphatase 163 (H) 38 - 126 U/L   Total Bilirubin 0.4  0.3 - 1.2 mg/dL   GFR calc non Af Amer 45 (L) >60 mL/min   GFR calc Af Amer 52 (L) >60 mL/min    Comment: (NOTE) The eGFR has been calculated using the CKD EPI equation. This calculation has not been validated in all clinical situations. eGFR's persistently <60 mL/min signify possible Chronic Kidney Disease.    Anion gap 13 5 - 15  CBC WITH DIFFERENTIAL     Status: None   Collection Time: 06/14/15  8:27 PM  Result Value Ref Range   WBC 8.9 4.0 - 10.5 K/uL   RBC 4.08 3.87 - 5.11 MIL/uL   Hemoglobin 12.9 12.0 - 15.0 g/dL   HCT 38.4 36.0 - 46.0 %   MCV 94.1 78.0 - 100.0 fL   MCH 31.6 26.0 - 34.0 pg   MCHC 33.6 30.0 - 36.0 g/dL   RDW 13.2 11.5 - 15.5 %   Platelets 273 150 - 400 K/uL   Neutrophils Relative % 61 %   Neutro Abs 5.4 1.7 - 7.7 K/uL   Lymphocytes Relative 28 %   Lymphs Abs 2.5 0.7 - 4.0 K/uL   Monocytes Relative  9 %   Monocytes Absolute 0.8 0.1 - 1.0 K/uL   Eosinophils Relative 2 %   Eosinophils Absolute 0.2 0.0 - 0.7 K/uL   Basophils Relative 0 %   Basophils Absolute 0.0 0.0 - 0.1 K/uL  Blood Culture (routine x 2)     Status: None (Preliminary result)   Collection Time: 06/14/15  8:27 PM  Result Value Ref Range   Specimen Description BLOOD RIGHT ANTECUBITAL    Special Requests BOTTLES DRAWN AEROBIC AND ANAEROBIC 5CC     Culture NO GROWTH < 24 HOURS    Report Status PENDING   APTT     Status: Abnormal   Collection Time: 06/14/15  8:27 PM  Result Value Ref Range   aPTT 41 (H) 24 - 37 seconds    Comment:        IF BASELINE aPTT IS ELEVATED, SUGGEST PATIENT RISK ASSESSMENT BE USED TO DETERMINE APPROPRIATE ANTICOAGULANT THERAPY.   Protime-INR     Status: None   Collection Time: 06/14/15  8:27 PM  Result Value Ref Range   Prothrombin Time 13.1 11.6 - 15.2 seconds   INR 0.97 0.00 - 1.49  Sedimentation rate     Status: Abnormal   Collection Time: 06/14/15  8:27 PM  Result Value Ref Range   Sed Rate 90 (H) 0 - 22 mm/hr  C-reactive protein     Status: Abnormal   Collection Time: 06/14/15  8:27 PM  Result Value Ref Range   CRP 1.9 (H) <1.0 mg/dL  I-Stat CG4 Lactic Acid, ED  (not at  [facility])     Status: None   Collection Time: 06/14/15  8:42 PM  Result Value Ref Range   Lactic Acid, Venous 1.74 0.5 - 2.0 mmol/L  Wound or Superficial Culture     Status: None (Preliminary result)   Collection Time: 06/14/15  9:56 PM  Result Value Ref Range   Specimen Description WOUND RIGHT LEG    Special Requests NONE    Gram Stain RARE WBC SEEN NO ORGANISMS SEEN     Culture NO GROWTH < 24 HOURS    Report Status PENDING   Urinalysis, Routine w reflex microscopic (not at [facility])     Status: Abnormal   Collection Time: 06/14/15 10:36 PM  Result Value Ref Range   Color, Urine YELLOW YELLOW  APPearance TURBID (A) CLEAR   Specific Gravity, Urine 1.017 1.005 - 1.030   pH 5.5 5.0 - 8.0   Glucose, UA  NEGATIVE NEGATIVE mg/dL   Hgb urine dipstick NEGATIVE NEGATIVE   Bilirubin Urine NEGATIVE NEGATIVE   Ketones, ur NEGATIVE NEGATIVE mg/dL   Protein, ur NEGATIVE NEGATIVE mg/dL   Nitrite NEGATIVE NEGATIVE   Leukocytes, UA SMALL (A) NEGATIVE  Urine microscopic-add on     Status: Abnormal   Collection Time: 06/14/15 10:36 PM  Result Value Ref Range   Squamous Epithelial / LPF TOO NUMEROUS TO COUNT (A) NONE SEEN   WBC, UA 6-30 0 - 5 WBC/hpf   RBC / HPF 0-5 0 - 5 RBC/hpf   Bacteria, UA RARE (A) NONE SEEN   Urine-Other YEAST PRESENT   CG4 I-STAT (Lactic acid)     Status: None   Collection Time: 06/14/15 10:48 PM  Result Value Ref Range   Lactic Acid, Venous 1.50 0.5 - 2.0 mmol/L  MRSA PCR Screening     Status: None   Collection Time: 06/15/15  2:05 AM  Result Value Ref Range   MRSA by PCR NEGATIVE NEGATIVE    Comment:        The GeneXpert MRSA Assay (FDA approved for NASAL specimens only), is one component of a comprehensive MRSA colonization surveillance program. It is not intended to diagnose MRSA infection nor to guide or monitor treatment for MRSA infections. Performed at [facility]   Creatinine, urine, random     Status: None   Collection Time: 06/15/15  2:22 AM  Result Value Ref Range   Creatinine, Urine 100.37 mg/dL  Basic metabolic panel     Status: Abnormal   Collection Time: 06/15/15  3:07 AM  Result Value Ref Range   Sodium 141 135 - 145 mmol/L   Potassium 3.1 (L) 3.5 - 5.1 mmol/L   Chloride 100 (L) 101 - 111 mmol/L   CO2 34 (H) 22 - 32 mmol/L   Glucose, Bld 126 (H) 65 - 99 mg/dL   BUN 23 (H) 6 - 20 mg/dL   Creatinine, Ser 1.18 (H) 0.44 - 1.00 mg/dL   Calcium 8.7 (L) 8.9 - 10.3 mg/dL   GFR calc non Af Amer 49 (L) >60 mL/min   GFR calc Af Amer 57 (L) >60 mL/min    Comment: (NOTE) The eGFR has been calculated using the CKD EPI equation. This calculation has not been validated in all clinical situations. eGFR's persistently <60 mL/min  signify possible Chronic Kidney Disease.    Anion gap 7 5 - 15  CBC     Status: Abnormal   Collection Time: 06/15/15  3:07 AM  Result Value Ref Range   WBC 9.6 4.0 - 10.5 K/uL   RBC 3.77 (L) 3.87 - 5.11 MIL/uL   Hemoglobin 11.5 (L) 12.0 - 15.0 g/dL   HCT 35.7 (L) 36.0 - 46.0 %   MCV 94.7 78.0 - 100.0 fL   MCH 30.5 26.0 - 34.0 pg   MCHC 32.2 30.0 - 36.0 g/dL   RDW 13.3 11.5 - 15.5 %   Platelets 257 150 - 400 K/uL    Dg Tibia/fibula Right  06/14/2015  CLINICAL DATA:  Acute onset of tenderness to palpation along the right tibia, with blood blisters and cellulitis. Initial encounter. EXAM: RIGHT TIBIA AND FIBULA - 2 VIEW COMPARISON:  Right lower leg radiographs performed 06/03/2015 FINDINGS: There is no evidence of osseous disruption. The tibia and fibula appear grossly intact. The  patient's total knee arthroplasty is grossly unremarkable in appearance. A small knee joint effusion is noted. Mild soft tissue swelling is noted about the lower leg and ankle. The ankle mortise is grossly unremarkable in appearance. IMPRESSION: No evidence of osseous disruption. Small knee joint effusion noted. 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 Electronically Signed   By: Garald Balding M.D.   On: 06/14/2015 20:16    ROS  ROS: I have reviewed the patient's review of systems thoroughly and there are no positive responses as relates to the HPI. EXAM. Blood pressure 96/40, pulse 70, temperature 98.6 F (37 C), temperature source Oral, resp. rate 18, height 5' 2"  (1.575 m), weight 123.152 kg (271 lb 8 oz), SpO2 100 %. Physical Exam Well-developed well-nourished patient in no acute distress. Alert and oriented x3 HEENT:within normal limits Cardiac: Regular rate and rhythm Pulmonary: Lungs clear to auscultation Abdomen: Soft and nontender.  Normal active bowel sounds  Musculoskeletal: (right lower extremity has 6 x 8 area of fluctuance with a central necrotic area. There is some mild  drainage from this area. There is minimal pain with range of motion of the knee or foot. Left leg has a small area of erythema on the postero-medial aspect. There is no fluctuance. It's tender to palpation. Assessment/Plan: 60 year old female with a fall several weeks ago with open wound that was closed and subsequently developed what appears to be a hematoma.the patient had an attempted I&D in the emergency room with no success. She was admitted to the hospitalist service with concern of infection and started on antibiotics. She has ultimately developed a small area of erythema on the left lower extremity.//After discussion with the patient ultimately elected to do a bedside I&D through the wound that she had already there. We were able to get out a tremendous amount of current jelly type hematoma and clot as well as some significant amounts of blood and blood tinged fluid. It did not appear to be an infection. Certainly there is a possibility of superinfection of this area given that it is just been open and draining for the last several days. At this point I think the appropriate course of action will be continued treatment with IV antibiotics and then ultimately changed to by mouth antibiotics and we should be I will follow her through the office. I have put a small drain in the wound and this should continue to drain with compression  andmotion of the lower extremity. We will continue to follow her while she is in the hospital. We will follow her as an outpatient once discharged.  Cheryln Balcom L 06/15/2015, 5:04 PM  Cell; [phone]  [phone]

## 2015-06-16 LAB — COMPREHENSIVE METABOLIC PANEL
ALT: 54 U/L (ref 14–54)
AST: 50 U/L — AB (ref 15–41)
Albumin: 2.6 g/dL — ABNORMAL LOW (ref 3.5–5.0)
Alkaline Phosphatase: 159 U/L — ABNORMAL HIGH (ref 38–126)
Anion gap: 7 (ref 5–15)
BUN: 15 mg/dL (ref 6–20)
CHLORIDE: 100 mmol/L — AB (ref 101–111)
CO2: 31 mmol/L (ref 22–32)
CREATININE: 1.2 mg/dL — AB (ref 0.44–1.00)
Calcium: 8.7 mg/dL — ABNORMAL LOW (ref 8.9–10.3)
GFR calc non Af Amer: 48 mL/min — ABNORMAL LOW (ref >60)
GFR, EST AFRICAN AMERICAN: 56 mL/min — AB (ref >60)
Glucose, Bld: 173 mg/dL — ABNORMAL HIGH (ref 65–99)
Potassium: 3.1 mmol/L — ABNORMAL LOW (ref 3.5–5.1)
SODIUM: 138 mmol/L (ref 135–145)
Total Bilirubin: 0.5 mg/dL (ref 0.3–1.2)
Total Protein: 5.6 g/dL — ABNORMAL LOW (ref 6.5–8.1)

## 2015-06-16 LAB — CBC
HCT: 35.6 % — ABNORMAL LOW (ref 36.0–46.0)
Hemoglobin: 11 g/dL — ABNORMAL LOW (ref 12.0–15.0)
MCH: 29.8 pg (ref 26.0–34.0)
MCHC: 30.9 g/dL (ref 30.0–36.0)
MCV: 96.5 fL (ref 78.0–100.0)
PLATELETS: 255 10*3/uL (ref 150–400)
RBC: 3.69 MIL/uL — AB (ref 3.87–5.11)
RDW: 13.5 % (ref 11.5–15.5)
WBC: 7.4 10*3/uL (ref 4.0–10.5)

## 2015-06-16 LAB — URINE CULTURE

## 2015-06-16 LAB — UREA NITROGEN, URINE: UREA NITROGEN UR: 859 mg/dL

## 2015-06-16 MED ORDER — POTASSIUM CHLORIDE CRYS ER 20 MEQ PO TBCR
30.0000 meq | EXTENDED_RELEASE_TABLET | Freq: Two times a day (BID) | ORAL | Status: DC
  Administered 2015-06-16 – 2015-06-17 (×3): 30 meq via ORAL
  Filled 2015-06-16 (×3): qty 1

## 2015-06-16 NOTE — Care Management Important Message (Signed)
Important Message  Patient Details  Name: Carla Leonard MRN: [id number] Date of Birth: December 30, 1955   Medicare Important Message Given:  Yes    Loann Quill 06/16/2015, 8:50 AM

## 2015-06-16 NOTE — Op Note (Signed)
NAMEMarland Kitchen  MEKHI, MCNEELY NO.:  [id number]  MEDICAL RECORD NO.:  [id number]  LOCATION:  5N19C                        FACILITY:  [place]  PHYSICIAN:  Alta Corning, M.D.   DATE OF BIRTH:  [birth year]  DATE OF PROCEDURE:  06/15/2015 DATE OF DISCHARGE:                              OPERATIVE REPORT   PREOPERATIVE DIAGNOSIS:  Hematoma, right lower extremity.  POSTOPERATIVE DIAGNOSIS:  Hematoma, right lower extremity.  PROCEDURE:  Irrigation and debridement of hematoma at the bedside.  SURGEON:  Alta Corning, M.D.  ANESTHESIA:  None.  BRIEF HISTORY:  Ms. Kelner is a 60 year old female, who had total knee replacements done many years ago.  She had done well up until recently when she tripped and sort of had an open wound on the right lower extremity, it was closed and subsequently developed a superinfection or hematoma or both.  She was admitted to Medicine Service with diagnosis of cellulitis and started on antibiotics.  She had an I and D in the emergency room, which was minimally successful.  We were consulted for evaluation.  At that point, felt that there was certainly fluid and/or hematoma or clot or something that needed to come out of this wound and so, we elected to use at the bed side, through the incision she already had.  DESCRIPTION OF PROCEDURE:  After evaluating the patient and feeling the bedside I and D was appropriate, the leg was prepped and draped in usual sterile fashion.  Following this, we went through the old incision and used a hemostat to start to remove significant amounts of blood clot. There were multiple areas of blood clot and once we were able to get some of this clot out, there been became what appeared to be chronic blood and so, through this wound, we were able to get out dramatic amounts of blood clot and just what appeared to be old blood.  Once this was done, the wound was irrigated and again drained and then, we put  a 0.25-inch Penrose drain in and covered it with 4x4s, couple of ABDs and Kerlix and an Ace wrap.  We will encourage her to move the foot and ankle and knee, and she is able to be up on this at this point.  We will follow her in the hospital.     Alta Corning, M.D.     Corliss Skains  D:  06/15/2015  T:  06/16/2015  Job:  [id number]

## 2015-06-16 NOTE — Progress Notes (Signed)
PROGRESS NOTE   Carla Leonard  [id number]  DOB: [birth year]  DOA: 06/14/2015 PCP: Bonnita Nasuti, MD Outpatient Specialists:  Hospital course: Carla Leonard is a 60 y.o. female with medical history significant of HTN, anxiety/depression, tobacco abuse, nephrolithiasis, and arthritis; who presents with complaints of worsening right leg redness and swelling. Patient recently fell all at her home after being spooked by a cat sustaining a laceration to the right lower leg on 5/31. She was seen in emergency department at that time and received 7 staples. She followed up with her primary care doctor to have the staples taken out and was placed on Keflex as the wound look to be infected and given another diuretic for swelling . Subsequently, 2 days ago she followed up with her PCP and had to have a pocket of dark blood evacuated. She complains of extension of the redness, pain, and reformation of the pocket of fluid. Patient notes that she took the new diuretic for approximately 2-3 days and it seemingly reduced swelling in her lower legs and therefore stopped it because she's been done with issues with her potassium.  ED Course: Upon admission to the emergency department patient was evaluated and seen to be afebrile with all other vitals noted within normal limits. Lab work included a CBC which was unremarkable, potassium 2.7, chloride 92, CO2 33, BUN 27, creatinine 1.28, lactic acid 1.74, INR 0.97, aPTT 41, and CRP elevated at 1.9. In the ED the patient underwent I&D was described as evacuation of a hematoma of the right leg. Patient was also given antibiotics of vancomycin and Zosyn as well as 40 mEq of potassium chloride in the ED   Assessment & Plan:   Suspected cellulitis of the right lower extremity with large hematoma: Acute. Patient with recent fall sustaining a laceration to the right lower extremity. Subsequent redness and swelling placed on Keflex by PCP without relief of symptoms. -  admitted to [facility]  - Follow-up cultures no growth to date - Empiric antibiotics of vancomycin and Zosyn for 1-2 more days IV per surgery. - Dr. Berenice Primas consulted and following.    Hypokalemia: Acute. Potassium on admission 2.7. Patient reports recent increase in Lasix without potassium supplementation. Given potassium chloride 40 mEq in the ED. - Given additional 40 mEq of potassium chloride today and tomorrow.   - Monitor and replace as needed. Check magnesium.  - Patient likely needs to be placed on oral supplementation if she continues her diuretics  Suspected acute kidney injury: Baseline creatinine previously had been less than 1. On presentation creatinine elevated at 1.28 with a BUN of 27. Patient reports increase in diuretics recently. - check FeUr - Follow-up repeat BMP - improved with IVFs  Essential hypertension  - Continue metoprolol - Holding furosemide for now.  Anxiety/depression - Continue Xanax prn  Elevated APTT: Question cause.  Chronic back pain - Continue oxycodone  Consultants:  [facility]  Procedures:  Antimicrobials: Anti-infectives    Start     Dose/Rate Route Frequency Ordered Stop   06/15/15 2200  vancomycin (VANCOCIN) 1,500 mg in sodium chloride 0.9 % 500 mL IVPB     1,500 mg 250 mL/hr over 120 Minutes Intravenous Every 24 hours 06/14/15 2245     06/15/15 0600  piperacillin-tazobactam (ZOSYN) IVPB 3.375 g     3.375 g 12.5 mL/hr over 240 Minutes Intravenous Every 8 hours 06/14/15 2245     06/14/15 2300  vancomycin (VANCOCIN) 500 mg in sodium chloride 0.9 % 100  mL IVPB     500 mg 100 mL/hr over 60 Minutes Intravenous  Once 06/14/15 2245     06/14/15 1930  piperacillin-tazobactam (ZOSYN) IVPB 3.375 g     3.375 g 100 mL/hr over 30 Minutes Intravenous  Once 06/14/15 1920 06/14/15 2058   06/14/15 1930  vancomycin (VANCOCIN) IVPB 1000 mg/200 mL premix     1,000 mg 200 mL/hr over 60 Minutes Intravenous  Once 06/14/15 1920 06/14/15  2246      Subjective: Pt says she tolerated the procedure and now focused on healing and wound care  Objective: Filed Vitals:   06/15/15 0532 06/15/15 1300 06/15/15 2102 06/16/15 0700  BP: 103/55 96/40 106/51 108/53  Pulse: 66 70 65 64  Temp: 98.6 F (37 C) 98.6 F (37 C) 97.7 F (36.5 C)   TempSrc: Oral  Oral   Resp: 18 18 18 18   Height:      Weight:      SpO2: 100%  97% 98%    Intake/Output Summary (Last 24 hours) at 06/16/15 1253 Last data filed at 06/15/15 1300  Gross per 24 hour  Intake    240 ml  Output      0 ml  Net    240 ml   Filed Weights   06/14/15 2256 06/15/15 0120  Weight: 277 lb 1.9 oz (125.7 kg) 271 lb 8 oz (123.152 kg)   Exam: Eyes: PERRL, lids and conjunctivae normal ENMT: Mucous membranes are moist. Posterior pharynx clear of any exudate or lesions.Normal dentition.  Neck: normal, supple, no masses, no thyromegaly Respiratory: clear to auscultation bilaterally, no wheezing, no crackles. Normal respiratory effort. No accessory muscle use.  Cardiovascular: Regular rate and rhythm, no murmurs / rubs / gallops. 1+ pitting extremity edema. 2+ pedal pulses. No carotid bruits.  Abdomen: no tenderness, no masses palpated. No hepatosplenomegaly. Bowel sounds positive.  Musculoskeletal: no clubbing / cyanosis. No joint deformity upper and lower extremities. Good ROM, no contractures. Normal muscle tone.  Skin: Right leg wound bandaged, no drainage and clean.     Data Reviewed: Basic Metabolic Panel:  Recent Labs Lab 06/14/15 2027 06/15/15 0307 06/16/15 0608  NA 138 141 138  K 2.7* 3.1* 3.1*  CL 92* 100* 100*  CO2 33* 34* 31  GLUCOSE 108* 126* 173*  BUN 27* 23* 15  CREATININE 1.28* 1.18* 1.20*  CALCIUM 8.9 8.7* 8.7*   Liver Function Tests:  Recent Labs Lab 06/14/15 2027 06/16/15 0608  AST 24 50*  ALT 23 54  ALKPHOS 163* 159*  BILITOT 0.4 0.5  PROT 6.9 5.6*  ALBUMIN 3.3* 2.6*   No results for input(s): LIPASE, AMYLASE in the last  168 hours. No results for input(s): AMMONIA in the last 168 hours. CBC:  Recent Labs Lab 06/14/15 2027 06/15/15 0307 06/16/15 0608  WBC 8.9 9.6 7.4  NEUTROABS 5.4  --   --   HGB 12.9 11.5* 11.0*  HCT 38.4 35.7* 35.6*  MCV 94.1 94.7 96.5  PLT 273 257 255   Cardiac Enzymes: No results for input(s): CKTOTAL, CKMB, CKMBINDEX, TROPONINI in the last 168 hours. BNP (last 3 results) No results for input(s): PROBNP in the last 8760 hours. CBG: No results for input(s): GLUCAP in the last 168 hours.  Recent Results (from the past 240 hour(s))  Blood Culture (routine x 2)     Status: None (Preliminary result)   Collection Time: 06/14/15  8:20 PM  Result Value Ref Range Status   Specimen Description BLOOD LEFT HAND  Final   Special Requests BOTTLES DRAWN AEROBIC ONLY 6CC  Final   Culture NO GROWTH < 24 HOURS  Final   Report Status PENDING  Incomplete  Blood Culture (routine x 2)     Status: None (Preliminary result)   Collection Time: 06/14/15  8:27 PM  Result Value Ref Range Status   Specimen Description BLOOD RIGHT ANTECUBITAL  Final   Special Requests BOTTLES DRAWN AEROBIC AND ANAEROBIC 5CC   Final   Culture NO GROWTH < 24 HOURS  Final   Report Status PENDING  Incomplete  Wound or Superficial Culture     Status: None (Preliminary result)   Collection Time: 06/14/15  9:56 PM  Result Value Ref Range Status   Specimen Description WOUND RIGHT LEG  Final   Special Requests NONE  Final   Gram Stain RARE WBC SEEN NO ORGANISMS SEEN   Final   Culture NO GROWTH 2 DAYS  Final   Report Status PENDING  Incomplete  Urine culture     Status: Abnormal   Collection Time: 06/14/15 10:36 PM  Result Value Ref Range Status   Specimen Description URINE, RANDOM  Final   Special Requests NONE  Final   Culture MULTIPLE SPECIES PRESENT, SUGGEST RECOLLECTION (A)  Final   Report Status 06/16/2015 FINAL  Final  MRSA PCR Screening     Status: None   Collection Time: 06/15/15  2:05 AM  Result Value  Ref Range Status   MRSA by PCR NEGATIVE NEGATIVE Final    Comment:        The GeneXpert MRSA Assay (FDA approved for NASAL specimens only), is one component of a comprehensive MRSA colonization surveillance program. It is not intended to diagnose MRSA infection nor to guide or monitor treatment for MRSA infections. Performed at [facility]     Studies: Dg Tibia/fibula Right  06/14/2015  CLINICAL DATA:  Acute onset of tenderness to palpation along the right tibia, with blood blisters and cellulitis. Initial encounter. EXAM: RIGHT TIBIA AND FIBULA - 2 VIEW COMPARISON:  Right lower leg radiographs performed 06/03/2015 FINDINGS: There is no evidence of osseous disruption. The tibia and fibula appear grossly intact. 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 A small knee joint effusion is noted. Mild soft tissue swelling is noted about the lower leg and ankle. The ankle mortise is grossly unremarkable in appearance. IMPRESSION: No evidence of osseous disruption. Small knee joint effusion noted. 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 Electronically Signed   By: Garald Balding M.D.   On: 06/14/2015 20:16   Scheduled Meds: . metolazone  2.5 mg Oral Daily  . metoprolol succinate  25 mg Oral Daily  . piperacillin-tazobactam (ZOSYN)  IV  3.375 g Intravenous Q8H  . traZODone  25 mg Oral QHS  . vancomycin  1,500 mg Intravenous Q24H  . vancomycin  500 mg Intravenous Once   Continuous Infusions:   Principal Problem:   Cellulitis of right leg Active Problems:   Hematoma   Essential hypertension   Acute kidney injury ([place])   Anxiety and depression   Cellulitis of right lower extremity   Hypokalemia   Renal insufficiency  Time spent:   Irwin Brakeman, MD, FAAFP [facility] Pager [phone]  If 7PM-7AM, please contact night-coverage [email or web address] Password [email or web address] 06/16/2015, 12:53 PM    LOS: 1 day

## 2015-06-16 NOTE — Progress Notes (Signed)
Subjective: Complains of moderate pain in right lower leg.  Objective: Vital signs in last 24 hours: Temp:  [97.7 F (36.5 C)-98.6 F (37 C)] 97.7 F (36.5 C) (06/12 2102) Pulse Rate:  [64-70] 64 (06/13 0700) Resp:  [18] 18 (06/13 0700) BP: (96-108)/(40-53) 108/53 mmHg (06/13 0700) SpO2:  [97 %-98 %] 98 % (06/13 0700)  Intake/Output from previous day: 06/12 0701 - 06/13 0700 In: 240 [P.O.:240] Out: -  Intake/Output this shift:     Recent Labs  06/14/15 2027 06/15/15 0307 06/16/15 0608  HGB 12.9 11.5* 11.0*    Recent Labs  06/15/15 0307 06/16/15 0608  WBC 9.6 7.4  RBC 3.77* 3.69*  HCT 35.7* 35.6*  PLT 257 255    Recent Labs  06/15/15 0307 06/16/15 0608  NA 141 138  K 3.1* 3.1*  CL 100* 100*  CO2 34* 31  BUN 23* 15  CREATININE 1.18* 1.20*  GLUCOSE 126* 173*  CALCIUM 8.7* 8.7*    Recent Labs  06/14/15 2027  INR 0.97  right lower extremity exam: Dressing changed.  The lower leg hematoma has been decompressed fairly well.  The Penrose drain is intact.  She does have an area of blackened skin which is approximately 6 x 5 cm in size.  I question its viability.  Calf is soft and nontender.  No sign of DVT.   Assessment/Plan: 1.  Status post I&D of hematoma right lower extremity. Plan: Dressing changed.  I milked out a small amount of fluid from the drain site.  A clean compressive dressing was reapplied. I would keep her in the hospital for IV antibiotics for 1 or 2 more days.  We'll pull the drain in one to 2 days. We will follow this patient.   Alahia Whicker G 06/16/2015, 8:56 AM

## 2015-06-17 DIAGNOSIS — I10 Essential (primary) hypertension: Secondary | ICD-10-CM

## 2015-06-17 DIAGNOSIS — N179 Acute kidney failure, unspecified: Secondary | ICD-10-CM

## 2015-06-17 DIAGNOSIS — L03115 Cellulitis of right lower limb: Principal | ICD-10-CM

## 2015-06-17 LAB — BASIC METABOLIC PANEL
ANION GAP: 5 (ref 5–15)
BUN: 12 mg/dL (ref 6–20)
CHLORIDE: 101 mmol/L (ref 101–111)
CO2: 32 mmol/L (ref 22–32)
Calcium: 8.7 mg/dL — ABNORMAL LOW (ref 8.9–10.3)
Creatinine, Ser: 1.22 mg/dL — ABNORMAL HIGH (ref 0.44–1.00)
GFR, EST AFRICAN AMERICAN: 55 mL/min — AB (ref >60)
GFR, EST NON AFRICAN AMERICAN: 47 mL/min — AB (ref >60)
GLUCOSE: 171 mg/dL — AB (ref 65–99)
POTASSIUM: 3.4 mmol/L — AB (ref 3.5–5.1)
Sodium: 138 mmol/L (ref 135–145)

## 2015-06-17 LAB — CBC
HCT: 34 % — ABNORMAL LOW (ref 36.0–46.0)
HEMOGLOBIN: 10.6 g/dL — AB (ref 12.0–15.0)
MCH: 30.5 pg (ref 26.0–34.0)
MCHC: 31.2 g/dL (ref 30.0–36.0)
MCV: 98 fL (ref 78.0–100.0)
Platelets: 262 10*3/uL (ref 150–400)
RBC: 3.47 MIL/uL — AB (ref 3.87–5.11)
RDW: 13.5 % (ref 11.5–15.5)
WBC: 7.2 10*3/uL (ref 4.0–10.5)

## 2015-06-17 LAB — AEROBIC CULTURE  (SUPERFICIAL SPECIMEN): CULTURE: NO GROWTH

## 2015-06-17 LAB — MAGNESIUM: Magnesium: 2 mg/dL (ref 1.7–2.4)

## 2015-06-17 LAB — AEROBIC CULTURE W GRAM STAIN (SUPERFICIAL SPECIMEN)

## 2015-06-17 LAB — APTT: APTT: 50 s — AB (ref 24–37)

## 2015-06-17 MED ORDER — DOXYCYCLINE HYCLATE 100 MG PO TABS
200.0000 mg | ORAL_TABLET | Freq: Once | ORAL | Status: AC
  Administered 2015-06-17: 200 mg via ORAL
  Filled 2015-06-17: qty 2

## 2015-06-17 MED ORDER — DOXYCYCLINE HYCLATE 100 MG PO TABS: 100.0000 mg | ORAL_TABLET | Freq: Two times a day (BID) | ORAL | Status: DC

## 2015-06-17 MED ORDER — OXYCODONE HCL 20 MG PO TABS: 20.0000 mg | ORAL_TABLET | Freq: Three times a day (TID) | ORAL | Status: DC

## 2015-06-17 NOTE — Discharge Summary (Signed)
Physician Discharge Summary  Carla Leonard [id number] DOB: [birth year] DOA: 06/14/2015  PCP: Bonnita Nasuti, MD  Admit date: 06/14/2015 Discharge date: 06/17/2015   Recommendations for Outpatient Follow-Up:   1. BMP 1 week 2. Follow up with ortho on Tuesday  3. Elevate legs 4. Holding lasix for now 5. TED hose once infection healed   Discharge Diagnosis:   Principal Problem:   Cellulitis of right leg Active Problems:   Hematoma   Essential hypertension   Acute kidney injury ([place])   Anxiety and depression   Cellulitis of right lower extremity   Hypokalemia   Renal insufficiency   Discharge disposition:  Home.   Discharge Condition: Improved.  Diet recommendation: Low sodium, heart healthy.  Carbohydrate-modified.  Wound care: keep wound clean   History of Present Illness:   Carla Leonard is a 60 y.o. female with medical history significant of HTN, anxiety/depression, tobacco abuse, nephrolithiasis, and arthritis; who presents with complaints of worsening right leg redness and swelling. Patient recently fell all at her home after being spooked by a cat sustaining a laceration to the right lower leg on 5/31. She was seen in emergency department at that time and received 7 staples. She followed up with her primary care doctor to have the staples taken out and was placed on Keflex as the wound look to be infected and given another diuretic for swelling . Subsequently, 2 days ago she followed up with her PCP and had to have a pocket of dark blood evacuated. She complains of extension of the redness, pain, and reformation of the pocket of fluid. Patient notes that she took the new diuretic for approximately 2-3 days and it seemingly reduced swelling in her lower legs and therefore stopped it because she's been done with issues with her potassium.   Hospital Course by Problem:   Status post I&d of hematoma right lower leg below a total knee replacement. -Dressing  changed -Penrose drain pulled. She may weight-bear as tolerated on the right -doxy  Hypokalemia: Acute. Potassium on admission 2.7. Patient reports recent increase in Lasix without potassium supplementation. Given potassium chloride 40 mEq in the ED. - replace daily   Suspected acute kidney injury: Baseline creatinine previously had been less than 1. On presentation creatinine elevated at 1.28 with a BUN of 27. Patient reports increase in diuretics recently. - improved with IVFs  Essential hypertension  - Continue metoprolol - Holding furosemide for now- defer restart to PCP.  Anxiety/depression - Continue Xanax prn   Chronic back pain - Continue oxycodone (gave 2 days of meds as she states she is out  Medical Consultants:    ortho   Discharge Exam:   Filed Vitals:   06/17/15 0500 06/17/15 0835  BP: 99/45 111/52  Pulse: 61 72  Temp: 97.8 F (36.6 C) 98.5 F (36.9 C)  Resp: 18 18   Filed Vitals:   06/16/15 1300 06/16/15 2132 06/17/15 0500 06/17/15 0835  BP: 104/51 105/45 99/45 111/52  Pulse: 66 67 61 72  Temp: 98.3 F (36.8 C) 98.8 F (37.1 C) 97.8 F (36.6 C) 98.5 F (36.9 C)  TempSrc:  Oral Oral Oral  Resp: 20 18 18 18   Height:      Weight:      SpO2: 99% 99% 97% 98%    Gen:  NAD    The results of significant diagnostics from this hospitalization (including imaging, microbiology, ancillary and laboratory) are listed below for reference.     Procedures  and Diagnostic Studies:   Dg Tibia/fibula Right  06/14/2015  CLINICAL DATA:  Acute onset of tenderness to palpation along the right tibia, with blood blisters and cellulitis. Initial encounter. EXAM: RIGHT TIBIA AND FIBULA - 2 VIEW COMPARISON:  Right lower leg radiographs performed 06/03/2015 FINDINGS: There is no evidence of osseous disruption. The tibia and fibula appear grossly intact. 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 A small knee joint effusion is noted.  Mild soft tissue swelling is noted about the lower leg and ankle. The ankle mortise is grossly unremarkable in appearance. IMPRESSION: No evidence of osseous disruption. Small knee joint effusion noted. 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 Electronically Signed   By: Garald Balding M.D.   On: 06/14/2015 20:16     Labs:   Basic Metabolic Panel:  Recent Labs Lab 06/14/15 2027 06/15/15 0307 06/16/15 0608 06/17/15 0526  NA 138 141 138 138  K 2.7* 3.1* 3.1* 3.4*  CL 92* 100* 100* 101  CO2 33* 34* 31 32  GLUCOSE 108* 126* 173* 171*  BUN 27* 23* 15 12  CREATININE 1.28* 1.18* 1.20* 1.22*  CALCIUM 8.9 8.7* 8.7* 8.7*  MG  --   --   --  2.0   GFR Estimated Creatinine Clearance: 61.4 mL/min (by C-G formula based on Cr of 1.22). Liver Function Tests:  Recent Labs Lab 06/14/15 2027 06/16/15 0608  AST 24 50*  ALT 23 54  ALKPHOS 163* 159*  BILITOT 0.4 0.5  PROT 6.9 5.6*  ALBUMIN 3.3* 2.6*   No results for input(s): LIPASE, AMYLASE in the last 168 hours. No results for input(s): AMMONIA in the last 168 hours. Coagulation profile  Recent Labs Lab 06/14/15 2027  INR 0.97    CBC:  Recent Labs Lab 06/14/15 2027 06/15/15 0307 06/16/15 0608 06/17/15 0526  WBC 8.9 9.6 7.4 7.2  NEUTROABS 5.4  --   --   --   HGB 12.9 11.5* 11.0* 10.6*  HCT 38.4 35.7* 35.6* 34.0*  MCV 94.1 94.7 96.5 98.0  PLT 273 257 255 262   Cardiac Enzymes: No results for input(s): CKTOTAL, CKMB, CKMBINDEX, TROPONINI in the last 168 hours. BNP: Invalid input(s): POCBNP CBG: No results for input(s): GLUCAP in the last 168 hours. D-Dimer No results for input(s): DDIMER in the last 72 hours. Hgb A1c No results for input(s): HGBA1C in the last 72 hours. Lipid Profile No results for input(s): CHOL, HDL, LDLCALC, TRIG, CHOLHDL, LDLDIRECT in the last 72 hours. Thyroid function studies No results for input(s): TSH, T4TOTAL, T3FREE, THYROIDAB in the last 72 hours.  Invalid  input(s): FREET3 Anemia work up No results for input(s): VITAMINB12, FOLATE, FERRITIN, TIBC, IRON, RETICCTPCT in the last 72 hours. Microbiology Recent Results (from the past 240 hour(s))  Blood Culture (routine x 2)     Status: None (Preliminary result)   Collection Time: 06/14/15  8:20 PM  Result Value Ref Range Status   Specimen Description BLOOD LEFT HAND  Final   Special Requests BOTTLES DRAWN AEROBIC ONLY 6CC  Final   Culture NO GROWTH 2 DAYS  Final   Report Status PENDING  Incomplete  Blood Culture (routine x 2)     Status: None (Preliminary result)   Collection Time: 06/14/15  8:27 PM  Result Value Ref Range Status   Specimen Description BLOOD RIGHT ANTECUBITAL  Final   Special Requests BOTTLES DRAWN AEROBIC AND ANAEROBIC 5CC   Final   Culture NO GROWTH 2 DAYS  Final  Report Status PENDING  Incomplete  Wound or Superficial Culture     Status: None   Collection Time: 06/14/15  9:56 PM  Result Value Ref Range Status   Specimen Description WOUND RIGHT LEG  Final   Special Requests NONE  Final   Gram Stain RARE WBC SEEN NO ORGANISMS SEEN   Final   Culture NO GROWTH 2 DAYS  Final   Report Status 06/17/2015 FINAL  Final  Urine culture     Status: Abnormal   Collection Time: 06/14/15 10:36 PM  Result Value Ref Range Status   Specimen Description URINE, RANDOM  Final   Special Requests NONE  Final   Culture MULTIPLE SPECIES PRESENT, SUGGEST RECOLLECTION (A)  Final   Report Status 06/16/2015 FINAL  Final  MRSA PCR Screening     Status: None   Collection Time: 06/15/15  2:05 AM  Result Value Ref Range Status   MRSA by PCR NEGATIVE NEGATIVE Final    Comment:        The GeneXpert MRSA Assay (FDA approved for NASAL specimens only), is one component of a comprehensive MRSA colonization surveillance program. It is not intended to diagnose MRSA infection nor to guide or monitor treatment for MRSA infections. Performed at [facility]      Discharge  Instructions:   Discharge Instructions    Diet - low sodium heart healthy    Complete by:  As directed      Discharge instructions    Complete by:  As directed   Weight daily-- if weight increases by 5 lbs and have LE edema-- resume lasix and see PCP BMP 1 week Keep legs elevated TED hose once leg healed     Increase activity slowly    Complete by:  As directed             Medication List    STOP taking these medications        cephALEXin 500 MG capsule  Commonly known as:  KEFLEX     furosemide 20 MG tablet  Commonly known as:  LASIX     Vitamin D3 2000 units Tabs     warfarin 5 MG tablet  Commonly known as:  COUMADIN      TAKE these medications        ALPRAZolam 0.5 MG tablet  Commonly known as:  XANAX  Take 0.5 mg by mouth 2 (two) times daily as needed for anxiety.     doxycycline 100 MG tablet  Commonly known as:  VIBRA-TABS  Take 1 tablet (100 mg total) by mouth every 12 (twelve) hours.  Start taking on:  06/18/2015     metolazone 2.5 MG tablet  Commonly known as:  ZAROXOLYN  Take 2.5 mg by mouth daily.     metoprolol succinate 25 MG 24 hr tablet  Commonly known as:  TOPROL-XL  Take 25 mg by mouth daily.     oxycodone 30 MG immediate release tablet  Commonly known as:  ROXICODONE  Take 30 mg by mouth every 4 (four) hours as needed. For pain     Potassium Gluconate 550 MG Tabs  Take 550 mg by mouth 2 (two) times daily as needed (leg cramps).     promethazine 25 MG tablet  Commonly known as:  PHENERGAN  Take 25 mg by mouth every 6 (six) hours as needed for nausea or vomiting.     traZODone 50 MG tablet  Commonly known as:  DESYREL  Take 25-50 mg  by mouth at bedtime.           Follow-up Information    Follow up with BETHUNE,JAMES G, PA-C. Schedule an appointment as soon as possible for a visit in 6 days.   Specialty:  Orthopedic Surgery   Contact information:   [address] [place]  [id number] [phone]       Follow up with HAGUE, Rosalyn Charters, MD In 1 week.   Specialty:  Internal Medicine   Why:  for [facility]   Contact information:   [address] [place] [address] [phone]        Time coordinating discharge: 35 min  Signed:  [place]   [facility] 06/17/2015, 1:21 PM

## 2015-06-17 NOTE — Discharge Instructions (Signed)
Elevate your right leg as much as possible. Leave dressing intact. You may weight-bear as tolerated on the right leg. Do ankle pumps as much as possible. Please call us at the office [phone] if you have any questions or problems.

## 2015-06-17 NOTE — Progress Notes (Signed)
Subjective: The patient complains of less pain in her right lower leg.  Objective: Vital signs in last 24 hours: Temp:  [97.8 F (36.6 C)-98.8 F (37.1 C)] 97.8 F (36.6 C) (06/14 0500) Pulse Rate:  [61-67] 61 (06/14 0500) Resp:  [18-20] 18 (06/14 0500) BP: (99-105)/(45-51) 99/45 mmHg (06/14 0500) SpO2:  [97 %-99 %] 97 % (06/14 0500)  Intake/Output from previous day: 06/13 0701 - 06/14 0700 In: 600 [P.O.:600] Out: -  Intake/Output this shift:     Recent Labs  06/14/15 2027 06/15/15 0307 06/16/15 0608 06/17/15 0526  HGB 12.9 11.5* 11.0* 10.6*    Recent Labs  06/16/15 0608 06/17/15 0526  WBC 7.4 7.2  RBC 3.69* 3.47*  HCT 35.6* 34.0*  PLT 255 262    Recent Labs  06/16/15 0608 06/17/15 0526  NA 138 138  K 3.1* 3.4*  CL 100* 101  CO2 31 32  BUN 15 12  CREATININE 1.20* 1.22*  GLUCOSE 173* 171*  CALCIUM 8.7* 8.7*    Recent Labs  06/14/15 2027  INR 0.97   Right lower leg exam: Dressing is removed. Penrose drain is removed. She does have an area of eschar over the anterior lateral aspect of the leg. She does not have a tense compartment. No sign of infection. Still a little oozing out of the small incision site. NV is intact distally. Full ankle plantar and dorsiflexion.   Assessment/Plan: Status post I&d of hematoma right lower leg below a total knee replacement. Plan: Dressing changed. Penrose drain pulled. She may weight-bear as tolerated on the right. I encouraged ankle range of motion to try to pump out any additional hematoma. She is okay from an orthopedic view point to be discharged home. I would send her on oral antibiotics. We will see her back in the office next Tuesday. She has our phone number if she has any problems. She  is instructed to keep the wound dry.   Aedan Geimer G 06/17/2015, 8:29 AM

## 2015-06-17 NOTE — Progress Notes (Signed)
Pt discharge education and instructions completed with pt and daughter at bedside; both voices understanding and denies any questions. Pt IV removed; RLE dsg remains clean, dry and intact. Pt handed her prescriptions for doxycycline and oxycodone. Pt discharge home with daughter to transport her home. Pt transported off unit via wheelchair with belongings and daughter to the side. Delia Heady RN

## 2015-06-19 LAB — CULTURE, BLOOD (ROUTINE X 2)
CULTURE: NO GROWTH
Culture: NO GROWTH

## 2019-07-25 NOTE — Progress Notes (Deleted)
[facility]    Provider:  Dr Jaynee Eagles Requesting Provider: Emergency Department Primary Care Provider:  Bonnita Nasuti, MD  CC:  seizure  HPI:  Carla Leonard is a 64 y.o. female here as requested by Bonnita Nasuti, MD for ***.  ***  Reviewed notes, labs and imaging from outside physicians, which showed ***  Review of Systems: Patient complains of symptoms per HPI as well as the following symptoms ***. Pertinent negatives and positives per HPI. All others negative.   Social History   Socioeconomic History  . Marital status: Legally Separated    Spouse name: Not on file  . Number of children: Not on file  . Years of education: Not on file  . Highest education level: Not on file  Occupational History  . Not on file  Tobacco Use  . Smoking status: Current Every Day Smoker  Substance and Sexual Activity  . Alcohol use: Yes  . Drug use: No  . Sexual activity: Not on file  Other Topics Concern  . Not on file  Social History Narrative  . Not on file   Social Determinants of Health   Financial Resource Strain:   . Difficulty of Paying Living Expenses:   Food Insecurity:   . Worried About Charity fundraiser in the Last Year:   . Arboriculturist in the Last Year:   Transportation Needs:   . Film/video editor (Medical):   Marland Kitchen Lack of Transportation (Non-Medical):   Physical Activity:   . Days of Exercise per Week:   . Minutes of Exercise per Session:   Stress:   . Feeling of Stress :   Social Connections:   . Frequency of Communication with Friends and Family:   . Frequency of Social Gatherings with Friends and Family:   . Attends Religious Services:   . Active Member of Clubs or Organizations:   . Attends Archivist Meetings:   Marland Kitchen Marital Status:   Intimate Partner Violence:   . Fear of Current or Ex-Partner:   . Emotionally Abused:   Marland Kitchen Physically Abused:   . Sexually Abused:     No family history on file.  Past Medical  History:  Diagnosis Date  . Arthritis   . Depression   . History of kidney stones   . Hypertension     Patient Active Problem List   Diagnosis Date Noted  . Hematoma 06/15/2015  . Essential hypertension 06/15/2015  . Acute kidney injury ([place]) 06/15/2015  . Anxiety and depression 06/15/2015  . Cellulitis of right lower extremity   . Hypokalemia   . Renal insufficiency   . Cellulitis 06/14/2015  . Cellulitis of right leg 06/14/2015  . Osteoarthritis of left knee 06/17/2011  . Ganglion of right wrist 06/17/2011    Past Surgical History:  Procedure Laterality Date  . APPENDECTOMY    . CHOLECYSTECTOMY    . ELBOW SURGERY     RIGHT  . JOINT REPLACEMENT     right TKA 06/2010  . KNEE ARTHROPLASTY  06/17/2011   Procedure: COMPUTER ASSISTED TOTAL KNEE ARTHROPLASTY;  Surgeon: Alta Corning, MD;  Location: [facility];  Service: Orthopedics;  Laterality: Left;  TOTAL KNEE REPLACEMENT WITH GENERAL ANESTHESIA AND PRE OP FEMORAL NERVE BLOCK  . PERIANAL CYST      Current Outpatient Medications  Medication Sig Dispense Refill  . ALPRAZolam (XANAX) 0.5 MG tablet Take 0.5 mg by mouth 2 (two) times daily as needed for anxiety.    Marland Kitchen  doxycycline (VIBRA-TABS) 100 MG tablet Take 1 tablet (100 mg total) by mouth every 12 (twelve) hours. 12 tablet 0  . metolazone (ZAROXOLYN) 2.5 MG tablet Take 2.5 mg by mouth daily.    . metoprolol succinate (TOPROL-XL) 25 MG 24 hr tablet Take 25 mg by mouth daily.    Marland Kitchen oxycodone (ROXICODONE) 30 MG immediate release tablet Take 30 mg by mouth every 4 (four) hours as needed. For pain    . Oxycodone HCl 20 MG TABS Take 1 tablet (20 mg total) by mouth 3 (three) times daily. 6 tablet 0  . Potassium Gluconate 550 MG TABS Take 550 mg by mouth 2 (two) times daily as needed (leg cramps).    . promethazine (PHENERGAN) 25 MG tablet Take 25 mg by mouth every 6 (six) hours as needed for nausea or vomiting.    . traZODone (DESYREL) 50 MG tablet Take 25-50 mg by mouth at bedtime.      No current facility-administered medications for this visit.    Allergies as of 07/26/2019 - Review Complete 06/15/2015  Allergen Reaction Noted  . Sulfa antibiotics Anaphylaxis 04/27/2011  . Nsaids Other (See Comments) 04/27/2011  . Other Other (See Comments) 05/27/2015    Vitals: There were no vitals taken for this visit. Last Weight:  Wt Readings from Last 1 Encounters:  06/15/15 271 lb 8 oz (123.2 kg)   Last Height:   Ht Readings from Last 1 Encounters:  06/14/15 5\' 2"  (1.575 m)     Physical exam: Exam: Gen: NAD, conversant, well nourised, obese, well groomed                     CV: RRR, no MRG. No Carotid Bruits. No peripheral edema, warm, nontender Eyes: Conjunctivae clear without exudates or hemorrhage  Neuro: Detailed Neurologic Exam  Speech:    Speech is normal; fluent and spontaneous with normal comprehension.  Cognition:    The patient is oriented to person, place, and time;     recent and remote memory intact;     language fluent;     normal attention, concentration,     fund of knowledge Cranial Nerves:    The pupils are equal, round, and reactive to light. The fundi are normal and spontaneous venous pulsations are present. Visual fields are full to finger confrontation. Extraocular movements are intact. Trigeminal sensation is intact and the muscles of mastication are normal. The face is symmetric. The palate elevates in the midline. Hearing intact. Voice is normal. Shoulder shrug is normal. The tongue has normal motion without fasciculations.   Coordination:    Normal finger to nose and heel to shin. Normal rapid alternating movements.   Gait:    Heel-toe and tandem gait are normal.   Motor Observation:    No asymmetry, no atrophy, and no involuntary movements noted. Tone:    Normal muscle tone.    Posture:    Posture is normal. normal erect    Strength:    Strength is V/V in the upper and lower limbs.      Sensation: intact to LT      Reflex Exam:  DTR's:    Deep tendon reflexes in the upper and lower extremities are normal bilaterally.   Toes:    The toes are downgoing bilaterally.   Clonus:    Clonus is absent.    Assessment/Plan:    No orders of the defined types were placed in this encounter.  No orders of the defined types  were placed in this encounter.   Cc: Hague, Rosalyn Charters, MD,  Bonnita Nasuti, MD  Sarina Ill, MD  [facility] [address] [place],  [address]  Phone [phone] Fax [phone]

## 2019-07-26 ENCOUNTER — Telehealth: Payer: Self-pay | Admitting: *Deleted

## 2019-07-26 ENCOUNTER — Ambulatory Visit: Payer: Self-pay | Admitting: Neurology

## 2019-07-26 NOTE — Telephone Encounter (Signed)
No showed new patient appointment. 

## 2019-09-24 LAB — COLOGUARD: COLOGUARD: NEGATIVE

## 2019-10-08 DIAGNOSIS — D696 Thrombocytopenia, unspecified: Secondary | ICD-10-CM | POA: Diagnosis not present

## 2019-12-20 ENCOUNTER — Encounter: Payer: Self-pay | Admitting: Oncology

## 2020-09-25 DIAGNOSIS — M4807 Spinal stenosis, lumbosacral region: Secondary | ICD-10-CM | POA: Diagnosis not present

## 2020-09-25 DIAGNOSIS — R110 Nausea: Secondary | ICD-10-CM | POA: Diagnosis not present

## 2020-09-25 DIAGNOSIS — D696 Thrombocytopenia, unspecified: Secondary | ICD-10-CM | POA: Diagnosis not present

## 2020-09-25 DIAGNOSIS — I10 Essential (primary) hypertension: Secondary | ICD-10-CM | POA: Diagnosis not present

## 2020-09-25 DIAGNOSIS — Z87891 Personal history of nicotine dependence: Secondary | ICD-10-CM | POA: Diagnosis not present

## 2020-09-25 DIAGNOSIS — J449 Chronic obstructive pulmonary disease, unspecified: Secondary | ICD-10-CM | POA: Diagnosis not present

## 2020-09-25 DIAGNOSIS — R569 Unspecified convulsions: Secondary | ICD-10-CM | POA: Diagnosis not present

## 2020-09-25 DIAGNOSIS — G4700 Insomnia, unspecified: Secondary | ICD-10-CM | POA: Diagnosis not present

## 2020-09-25 DIAGNOSIS — Z9181 History of falling: Secondary | ICD-10-CM | POA: Diagnosis not present

## 2020-10-05 DIAGNOSIS — R569 Unspecified convulsions: Secondary | ICD-10-CM | POA: Diagnosis not present

## 2020-10-05 DIAGNOSIS — J449 Chronic obstructive pulmonary disease, unspecified: Secondary | ICD-10-CM | POA: Diagnosis not present

## 2020-10-05 DIAGNOSIS — Z87891 Personal history of nicotine dependence: Secondary | ICD-10-CM | POA: Diagnosis not present

## 2020-10-05 DIAGNOSIS — M4807 Spinal stenosis, lumbosacral region: Secondary | ICD-10-CM | POA: Diagnosis not present

## 2020-10-05 DIAGNOSIS — I10 Essential (primary) hypertension: Secondary | ICD-10-CM | POA: Diagnosis not present

## 2020-10-05 DIAGNOSIS — D696 Thrombocytopenia, unspecified: Secondary | ICD-10-CM | POA: Diagnosis not present

## 2020-10-05 DIAGNOSIS — R110 Nausea: Secondary | ICD-10-CM | POA: Diagnosis not present

## 2020-10-05 DIAGNOSIS — G4700 Insomnia, unspecified: Secondary | ICD-10-CM | POA: Diagnosis not present

## 2020-10-13 DIAGNOSIS — M47816 Spondylosis without myelopathy or radiculopathy, lumbar region: Secondary | ICD-10-CM | POA: Diagnosis not present

## 2020-10-30 DIAGNOSIS — I10 Essential (primary) hypertension: Secondary | ICD-10-CM | POA: Diagnosis not present

## 2020-10-30 DIAGNOSIS — R110 Nausea: Secondary | ICD-10-CM | POA: Diagnosis not present

## 2020-10-30 DIAGNOSIS — R569 Unspecified convulsions: Secondary | ICD-10-CM | POA: Diagnosis not present

## 2020-10-30 DIAGNOSIS — Z87891 Personal history of nicotine dependence: Secondary | ICD-10-CM | POA: Diagnosis not present

## 2020-10-30 DIAGNOSIS — M4807 Spinal stenosis, lumbosacral region: Secondary | ICD-10-CM | POA: Diagnosis not present

## 2020-10-30 DIAGNOSIS — D696 Thrombocytopenia, unspecified: Secondary | ICD-10-CM | POA: Diagnosis not present

## 2020-10-30 DIAGNOSIS — J449 Chronic obstructive pulmonary disease, unspecified: Secondary | ICD-10-CM | POA: Diagnosis not present

## 2020-10-30 DIAGNOSIS — G4700 Insomnia, unspecified: Secondary | ICD-10-CM | POA: Diagnosis not present

## 2020-11-06 DIAGNOSIS — Z87891 Personal history of nicotine dependence: Secondary | ICD-10-CM | POA: Diagnosis not present

## 2020-11-06 DIAGNOSIS — D696 Thrombocytopenia, unspecified: Secondary | ICD-10-CM | POA: Diagnosis not present

## 2020-11-06 DIAGNOSIS — R110 Nausea: Secondary | ICD-10-CM | POA: Diagnosis not present

## 2020-11-06 DIAGNOSIS — Z23 Encounter for immunization: Secondary | ICD-10-CM | POA: Diagnosis not present

## 2020-11-06 DIAGNOSIS — I10 Essential (primary) hypertension: Secondary | ICD-10-CM | POA: Diagnosis not present

## 2020-11-06 DIAGNOSIS — R569 Unspecified convulsions: Secondary | ICD-10-CM | POA: Diagnosis not present

## 2020-11-06 DIAGNOSIS — R1032 Left lower quadrant pain: Secondary | ICD-10-CM | POA: Diagnosis not present

## 2020-11-06 DIAGNOSIS — J449 Chronic obstructive pulmonary disease, unspecified: Secondary | ICD-10-CM | POA: Diagnosis not present

## 2020-11-06 DIAGNOSIS — M4807 Spinal stenosis, lumbosacral region: Secondary | ICD-10-CM | POA: Diagnosis not present

## 2020-11-06 DIAGNOSIS — G4700 Insomnia, unspecified: Secondary | ICD-10-CM | POA: Diagnosis not present

## 2020-11-13 ENCOUNTER — Emergency Department (HOSPITAL_COMMUNITY): Payer: Medicare Other

## 2020-11-13 ENCOUNTER — Emergency Department (HOSPITAL_COMMUNITY)
Admission: EM | Admit: 2020-11-13 | Discharge: 2020-11-14 | Disposition: A | Discharge status: Home / Self Care | Payer: Medicare Other | Attending: Medical | Admitting: Medical

## 2020-11-13 ENCOUNTER — Other Ambulatory Visit: Payer: Self-pay

## 2020-11-13 ENCOUNTER — Encounter (HOSPITAL_COMMUNITY): Payer: Self-pay | Admitting: Emergency Medicine

## 2020-11-13 DIAGNOSIS — R109 Unspecified abdominal pain: Secondary | ICD-10-CM | POA: Diagnosis not present

## 2020-11-13 DIAGNOSIS — Z5321 Procedure and treatment not carried out due to patient leaving prior to being seen by health care provider: Secondary | ICD-10-CM | POA: Diagnosis not present

## 2020-11-13 DIAGNOSIS — R635 Abnormal weight gain: Secondary | ICD-10-CM | POA: Insufficient documentation

## 2020-11-13 DIAGNOSIS — R600 Localized edema: Secondary | ICD-10-CM | POA: Insufficient documentation

## 2020-11-13 DIAGNOSIS — M4814 Ankylosing hyperostosis [Forestier], thoracic region: Secondary | ICD-10-CM | POA: Diagnosis not present

## 2020-11-13 DIAGNOSIS — R7989 Other specified abnormal findings of blood chemistry: Secondary | ICD-10-CM | POA: Diagnosis not present

## 2020-11-13 DIAGNOSIS — I700 Atherosclerosis of aorta: Secondary | ICD-10-CM | POA: Diagnosis not present

## 2020-11-13 LAB — COMPREHENSIVE METABOLIC PANEL
ALT: 22 U/L (ref 0–44)
AST: 34 U/L (ref 15–41)
Albumin: 2.4 g/dL — ABNORMAL LOW (ref 3.5–5.0)
Alkaline Phosphatase: 127 U/L — ABNORMAL HIGH (ref 38–126)
Anion gap: 5 (ref 5–15)
BUN: 13 mg/dL (ref 8–23)
CO2: 26 mmol/L (ref 22–32)
Calcium: 8 mg/dL — ABNORMAL LOW (ref 8.9–10.3)
Chloride: 104 mmol/L (ref 98–111)
Creatinine, Ser: 1.01 mg/dL — ABNORMAL HIGH (ref 0.44–1.00)
GFR, Estimated: >60 mL/min (ref >60)
Glucose, Bld: 83 mg/dL (ref 70–99)
Potassium: 3.9 mmol/L (ref 3.5–5.1)
Sodium: 135 mmol/L (ref 135–145)
Total Bilirubin: 0.9 mg/dL (ref 0.3–1.2)
Total Protein: 6.7 g/dL (ref 6.5–8.1)

## 2020-11-13 LAB — CBC WITH DIFFERENTIAL/PLATELET
Abs Immature Granulocytes: 0.02 10*3/uL (ref 0.00–0.07)
Basophils Absolute: 0.1 10*3/uL (ref 0.0–0.1)
Basophils Relative: 1 %
Eosinophils Absolute: 0.2 10*3/uL (ref 0.0–0.5)
Eosinophils Relative: 4 %
HCT: 37.7 % (ref 36.0–46.0)
Hemoglobin: 12.1 g/dL (ref 12.0–15.0)
Immature Granulocytes: 0 %
Lymphocytes Relative: 18 %
Lymphs Abs: 1 10*3/uL (ref 0.7–4.0)
MCH: 33.2 pg (ref 26.0–34.0)
MCHC: 32.1 g/dL (ref 30.0–36.0)
MCV: 103.3 fL — ABNORMAL HIGH (ref 80.0–100.0)
Monocytes Absolute: 0.5 10*3/uL (ref 0.1–1.0)
Monocytes Relative: 9 %
Neutro Abs: 3.7 10*3/uL (ref 1.7–7.7)
Neutrophils Relative %: 68 %
Platelets: 122 10*3/uL — ABNORMAL LOW (ref 150–400)
RBC: 3.65 MIL/uL — ABNORMAL LOW (ref 3.87–5.11)
RDW: 15.8 % — ABNORMAL HIGH (ref 11.5–15.5)
WBC: 5.5 10*3/uL (ref 4.0–10.5)
nRBC: 0 % (ref 0.0–0.2)

## 2020-11-13 LAB — LIPASE, BLOOD: Lipase: 34 U/L (ref 11–51)

## 2020-11-13 LAB — BRAIN NATRIURETIC PEPTIDE: B Natriuretic Peptide: 213.7 pg/mL — ABNORMAL HIGH (ref 0.0–100.0)

## 2020-11-13 NOTE — ED Triage Notes (Signed)
Pt reports a "growth" to the left side of her abdomen for over a year. Abdomen firm, c/o pain with walking.

## 2020-11-13 NOTE — ED Provider Notes (Signed)
Emergency Medicine Provider Triage Evaluation Note  Carla Leonard , a 64 y.o. female  was evaluated in triage.  Pt complains of gradual onset, constant, worsening, left-sided abdominal swelling for the past year.  She reports that she has gained approximately 40 pounds without trying.  She mentions that from time to time she will have 5 days every month of vomiting and diarrhea.  She states that she has been seen by her PCP without answers.  She states she came here today to get answers for her symptoms.   Review of Systems  Positive: + "Growth to abdomen", abd swelling, weight gain Negative: - fevers, chills  Physical Exam  BP 118/67 (BP Location: Left Arm)   Pulse 71   Temp 98.1 F (36.7 C) (Oral)   Resp 20   SpO2 98%  Gen:   Awake, no distress   Resp:  Normal effort  MSK:   Moves extremities without difficulty  Other:  Abd swollen, tight, TTP to LUQ  Medical Decision Making  Medically screening exam initiated at 6:30 PM.  Appropriate orders placed.  DESSA LEDEE was informed that the remainder of the evaluation will be completed by another provider, this initial triage assessment does not replace that evaluation, and the importance of remaining in the ED until their evaluation is complete.     Eustaquio Maize, PA-C 11/13/20 1836    Regan Lemming, MD 11/13/20 2052

## 2020-11-14 NOTE — ED Notes (Signed)
Pt states she will follow up with her pcp d/t wait time

## 2020-11-27 DIAGNOSIS — R110 Nausea: Secondary | ICD-10-CM | POA: Diagnosis not present

## 2020-11-27 DIAGNOSIS — Z87891 Personal history of nicotine dependence: Secondary | ICD-10-CM | POA: Diagnosis not present

## 2020-11-27 DIAGNOSIS — I10 Essential (primary) hypertension: Secondary | ICD-10-CM | POA: Diagnosis not present

## 2020-11-27 DIAGNOSIS — G4700 Insomnia, unspecified: Secondary | ICD-10-CM | POA: Diagnosis not present

## 2020-11-27 DIAGNOSIS — R569 Unspecified convulsions: Secondary | ICD-10-CM | POA: Diagnosis not present

## 2020-11-27 DIAGNOSIS — J449 Chronic obstructive pulmonary disease, unspecified: Secondary | ICD-10-CM | POA: Diagnosis not present

## 2020-11-27 DIAGNOSIS — D696 Thrombocytopenia, unspecified: Secondary | ICD-10-CM | POA: Diagnosis not present

## 2020-11-27 DIAGNOSIS — M4807 Spinal stenosis, lumbosacral region: Secondary | ICD-10-CM | POA: Diagnosis not present

## 2020-12-03 DIAGNOSIS — G4700 Insomnia, unspecified: Secondary | ICD-10-CM | POA: Diagnosis not present

## 2020-12-03 DIAGNOSIS — Z87891 Personal history of nicotine dependence: Secondary | ICD-10-CM | POA: Diagnosis not present

## 2020-12-03 DIAGNOSIS — M4807 Spinal stenosis, lumbosacral region: Secondary | ICD-10-CM | POA: Diagnosis not present

## 2020-12-03 DIAGNOSIS — R1032 Left lower quadrant pain: Secondary | ICD-10-CM | POA: Diagnosis not present

## 2020-12-03 DIAGNOSIS — J449 Chronic obstructive pulmonary disease, unspecified: Secondary | ICD-10-CM | POA: Diagnosis not present

## 2020-12-03 DIAGNOSIS — R569 Unspecified convulsions: Secondary | ICD-10-CM | POA: Diagnosis not present

## 2020-12-03 DIAGNOSIS — R110 Nausea: Secondary | ICD-10-CM | POA: Diagnosis not present

## 2020-12-03 DIAGNOSIS — D696 Thrombocytopenia, unspecified: Secondary | ICD-10-CM | POA: Diagnosis not present

## 2020-12-03 DIAGNOSIS — I10 Essential (primary) hypertension: Secondary | ICD-10-CM | POA: Diagnosis not present

## 2020-12-11 DIAGNOSIS — R1032 Left lower quadrant pain: Secondary | ICD-10-CM | POA: Diagnosis not present

## 2020-12-11 DIAGNOSIS — R188 Other ascites: Secondary | ICD-10-CM | POA: Diagnosis not present

## 2020-12-14 DIAGNOSIS — G4700 Insomnia, unspecified: Secondary | ICD-10-CM | POA: Diagnosis not present

## 2020-12-14 DIAGNOSIS — M4807 Spinal stenosis, lumbosacral region: Secondary | ICD-10-CM | POA: Diagnosis not present

## 2020-12-14 DIAGNOSIS — R569 Unspecified convulsions: Secondary | ICD-10-CM | POA: Diagnosis not present

## 2020-12-14 DIAGNOSIS — I10 Essential (primary) hypertension: Secondary | ICD-10-CM | POA: Diagnosis not present

## 2020-12-14 DIAGNOSIS — J449 Chronic obstructive pulmonary disease, unspecified: Secondary | ICD-10-CM | POA: Diagnosis not present

## 2020-12-14 DIAGNOSIS — D696 Thrombocytopenia, unspecified: Secondary | ICD-10-CM | POA: Diagnosis not present

## 2020-12-14 DIAGNOSIS — R188 Other ascites: Secondary | ICD-10-CM | POA: Diagnosis not present

## 2020-12-14 DIAGNOSIS — Z87891 Personal history of nicotine dependence: Secondary | ICD-10-CM | POA: Diagnosis not present

## 2020-12-14 DIAGNOSIS — R110 Nausea: Secondary | ICD-10-CM | POA: Diagnosis not present

## 2020-12-14 DIAGNOSIS — R1032 Left lower quadrant pain: Secondary | ICD-10-CM | POA: Diagnosis not present

## 2020-12-24 DIAGNOSIS — G4700 Insomnia, unspecified: Secondary | ICD-10-CM | POA: Diagnosis not present

## 2020-12-24 DIAGNOSIS — D696 Thrombocytopenia, unspecified: Secondary | ICD-10-CM | POA: Diagnosis not present

## 2020-12-24 DIAGNOSIS — M4807 Spinal stenosis, lumbosacral region: Secondary | ICD-10-CM | POA: Diagnosis not present

## 2020-12-24 DIAGNOSIS — I10 Essential (primary) hypertension: Secondary | ICD-10-CM | POA: Diagnosis not present

## 2020-12-24 DIAGNOSIS — Z87891 Personal history of nicotine dependence: Secondary | ICD-10-CM | POA: Diagnosis not present

## 2020-12-24 DIAGNOSIS — R188 Other ascites: Secondary | ICD-10-CM | POA: Diagnosis not present

## 2020-12-24 DIAGNOSIS — R1032 Left lower quadrant pain: Secondary | ICD-10-CM | POA: Diagnosis not present

## 2020-12-24 DIAGNOSIS — J449 Chronic obstructive pulmonary disease, unspecified: Secondary | ICD-10-CM | POA: Diagnosis not present

## 2020-12-24 DIAGNOSIS — R569 Unspecified convulsions: Secondary | ICD-10-CM | POA: Diagnosis not present

## 2020-12-24 DIAGNOSIS — R110 Nausea: Secondary | ICD-10-CM | POA: Diagnosis not present

## 2021-01-01 DIAGNOSIS — J449 Chronic obstructive pulmonary disease, unspecified: Secondary | ICD-10-CM | POA: Diagnosis not present

## 2021-01-01 DIAGNOSIS — R188 Other ascites: Secondary | ICD-10-CM | POA: Diagnosis not present

## 2021-01-01 DIAGNOSIS — G4700 Insomnia, unspecified: Secondary | ICD-10-CM | POA: Diagnosis not present

## 2021-01-01 DIAGNOSIS — R110 Nausea: Secondary | ICD-10-CM | POA: Diagnosis not present

## 2021-01-01 DIAGNOSIS — M4807 Spinal stenosis, lumbosacral region: Secondary | ICD-10-CM | POA: Diagnosis not present

## 2021-01-01 DIAGNOSIS — Z87891 Personal history of nicotine dependence: Secondary | ICD-10-CM | POA: Diagnosis not present

## 2021-01-01 DIAGNOSIS — D696 Thrombocytopenia, unspecified: Secondary | ICD-10-CM | POA: Diagnosis not present

## 2021-01-01 DIAGNOSIS — R1032 Left lower quadrant pain: Secondary | ICD-10-CM | POA: Diagnosis not present

## 2021-01-01 DIAGNOSIS — I10 Essential (primary) hypertension: Secondary | ICD-10-CM | POA: Diagnosis not present

## 2021-01-01 DIAGNOSIS — R569 Unspecified convulsions: Secondary | ICD-10-CM | POA: Diagnosis not present

## 2021-01-21 DIAGNOSIS — F3341 Major depressive disorder, recurrent, in partial remission: Secondary | ICD-10-CM | POA: Diagnosis not present

## 2021-01-21 DIAGNOSIS — M4807 Spinal stenosis, lumbosacral region: Secondary | ICD-10-CM | POA: Diagnosis not present

## 2021-01-21 DIAGNOSIS — F419 Anxiety disorder, unspecified: Secondary | ICD-10-CM | POA: Diagnosis not present

## 2021-01-21 DIAGNOSIS — G4700 Insomnia, unspecified: Secondary | ICD-10-CM | POA: Diagnosis not present

## 2021-01-21 DIAGNOSIS — K7460 Unspecified cirrhosis of liver: Secondary | ICD-10-CM | POA: Diagnosis not present

## 2021-01-21 DIAGNOSIS — Z87891 Personal history of nicotine dependence: Secondary | ICD-10-CM | POA: Diagnosis not present

## 2021-01-21 DIAGNOSIS — R188 Other ascites: Secondary | ICD-10-CM | POA: Diagnosis not present

## 2021-01-21 DIAGNOSIS — R110 Nausea: Secondary | ICD-10-CM | POA: Diagnosis not present

## 2021-01-21 DIAGNOSIS — J449 Chronic obstructive pulmonary disease, unspecified: Secondary | ICD-10-CM | POA: Diagnosis not present

## 2021-01-24 DIAGNOSIS — Z743 Need for continuous supervision: Secondary | ICD-10-CM | POA: Diagnosis not present

## 2021-01-24 DIAGNOSIS — L03311 Cellulitis of abdominal wall: Secondary | ICD-10-CM | POA: Diagnosis not present

## 2021-01-24 DIAGNOSIS — R188 Other ascites: Secondary | ICD-10-CM | POA: Diagnosis not present

## 2021-01-24 DIAGNOSIS — R609 Edema, unspecified: Secondary | ICD-10-CM | POA: Diagnosis not present

## 2021-01-24 DIAGNOSIS — R6889 Other general symptoms and signs: Secondary | ICD-10-CM | POA: Diagnosis not present

## 2021-01-25 DIAGNOSIS — L03311 Cellulitis of abdominal wall: Secondary | ICD-10-CM | POA: Diagnosis not present

## 2021-01-25 DIAGNOSIS — R609 Edema, unspecified: Secondary | ICD-10-CM | POA: Diagnosis not present

## 2021-01-25 DIAGNOSIS — Z743 Need for continuous supervision: Secondary | ICD-10-CM | POA: Diagnosis not present

## 2021-02-01 ENCOUNTER — Emergency Department (HOSPITAL_COMMUNITY): Payer: Medicare HMO

## 2021-02-01 ENCOUNTER — Encounter (HOSPITAL_COMMUNITY): Payer: Self-pay | Admitting: Emergency Medicine

## 2021-02-01 ENCOUNTER — Inpatient Hospital Stay (HOSPITAL_COMMUNITY)
Admission: EM | Admit: 2021-02-01 | Discharge: 2021-02-05 | DRG: 603 | Disposition: A | Discharge status: Home Health | Payer: Medicare HMO | Attending: Internal Medicine | Admitting: Internal Medicine

## 2021-02-01 ENCOUNTER — Other Ambulatory Visit: Payer: Self-pay

## 2021-02-01 DIAGNOSIS — F32A Depression, unspecified: Secondary | ICD-10-CM | POA: Diagnosis present

## 2021-02-01 DIAGNOSIS — R188 Other ascites: Secondary | ICD-10-CM | POA: Diagnosis present

## 2021-02-01 DIAGNOSIS — L0390 Cellulitis, unspecified: Secondary | ICD-10-CM | POA: Diagnosis not present

## 2021-02-01 DIAGNOSIS — I959 Hypotension, unspecified: Secondary | ICD-10-CM | POA: Diagnosis present

## 2021-02-01 DIAGNOSIS — E039 Hypothyroidism, unspecified: Secondary | ICD-10-CM | POA: Diagnosis present

## 2021-02-01 DIAGNOSIS — Z96653 Presence of artificial knee joint, bilateral: Secondary | ICD-10-CM | POA: Diagnosis present

## 2021-02-01 DIAGNOSIS — Z79899 Other long term (current) drug therapy: Secondary | ICD-10-CM

## 2021-02-01 DIAGNOSIS — R791 Abnormal coagulation profile: Secondary | ICD-10-CM | POA: Diagnosis present

## 2021-02-01 DIAGNOSIS — N1831 Chronic kidney disease, stage 3a: Secondary | ICD-10-CM | POA: Diagnosis present

## 2021-02-01 DIAGNOSIS — Z716 Tobacco abuse counseling: Secondary | ICD-10-CM

## 2021-02-01 DIAGNOSIS — E785 Hyperlipidemia, unspecified: Secondary | ICD-10-CM | POA: Diagnosis present

## 2021-02-01 DIAGNOSIS — R195 Other fecal abnormalities: Secondary | ICD-10-CM | POA: Diagnosis present

## 2021-02-01 DIAGNOSIS — F3341 Major depressive disorder, recurrent, in partial remission: Secondary | ICD-10-CM | POA: Diagnosis not present

## 2021-02-01 DIAGNOSIS — L02219 Cutaneous abscess of trunk, unspecified: Secondary | ICD-10-CM | POA: Diagnosis not present

## 2021-02-01 DIAGNOSIS — E872 Acidosis, unspecified: Secondary | ICD-10-CM | POA: Diagnosis present

## 2021-02-01 DIAGNOSIS — Z20822 Contact with and (suspected) exposure to covid-19: Secondary | ICD-10-CM | POA: Diagnosis present

## 2021-02-01 DIAGNOSIS — F17210 Nicotine dependence, cigarettes, uncomplicated: Secondary | ICD-10-CM | POA: Diagnosis present

## 2021-02-01 DIAGNOSIS — R109 Unspecified abdominal pain: Secondary | ICD-10-CM | POA: Diagnosis not present

## 2021-02-01 DIAGNOSIS — L03311 Cellulitis of abdominal wall: Principal | ICD-10-CM | POA: Diagnosis present

## 2021-02-01 DIAGNOSIS — K7460 Unspecified cirrhosis of liver: Secondary | ICD-10-CM | POA: Diagnosis not present

## 2021-02-01 DIAGNOSIS — M1990 Unspecified osteoarthritis, unspecified site: Secondary | ICD-10-CM | POA: Diagnosis present

## 2021-02-01 DIAGNOSIS — Z87442 Personal history of urinary calculi: Secondary | ICD-10-CM

## 2021-02-01 DIAGNOSIS — I700 Atherosclerosis of aorta: Secondary | ICD-10-CM | POA: Diagnosis not present

## 2021-02-01 DIAGNOSIS — Z882 Allergy status to sulfonamides status: Secondary | ICD-10-CM

## 2021-02-01 DIAGNOSIS — K921 Melena: Secondary | ICD-10-CM | POA: Diagnosis present

## 2021-02-01 DIAGNOSIS — R140 Abdominal distension (gaseous): Secondary | ICD-10-CM | POA: Diagnosis not present

## 2021-02-01 DIAGNOSIS — D696 Thrombocytopenia, unspecified: Secondary | ICD-10-CM | POA: Diagnosis present

## 2021-02-01 DIAGNOSIS — E2740 Unspecified adrenocortical insufficiency: Secondary | ICD-10-CM | POA: Diagnosis not present

## 2021-02-01 DIAGNOSIS — Z87891 Personal history of nicotine dependence: Secondary | ICD-10-CM | POA: Diagnosis not present

## 2021-02-01 DIAGNOSIS — Z79891 Long term (current) use of opiate analgesic: Secondary | ICD-10-CM

## 2021-02-01 DIAGNOSIS — E8809 Other disorders of plasma-protein metabolism, not elsewhere classified: Secondary | ICD-10-CM | POA: Diagnosis present

## 2021-02-01 DIAGNOSIS — N183 Chronic kidney disease, stage 3 unspecified: Secondary | ICD-10-CM | POA: Diagnosis present

## 2021-02-01 DIAGNOSIS — I129 Hypertensive chronic kidney disease with stage 1 through stage 4 chronic kidney disease, or unspecified chronic kidney disease: Secondary | ICD-10-CM | POA: Diagnosis present

## 2021-02-01 DIAGNOSIS — Z743 Need for continuous supervision: Secondary | ICD-10-CM | POA: Diagnosis not present

## 2021-02-01 DIAGNOSIS — G40909 Epilepsy, unspecified, not intractable, without status epilepticus: Secondary | ICD-10-CM

## 2021-02-01 DIAGNOSIS — Z6837 Body mass index (BMI) 37.0-37.9, adult: Secondary | ICD-10-CM

## 2021-02-01 DIAGNOSIS — R110 Nausea: Secondary | ICD-10-CM | POA: Diagnosis not present

## 2021-02-01 DIAGNOSIS — N179 Acute kidney failure, unspecified: Secondary | ICD-10-CM | POA: Diagnosis present

## 2021-02-01 DIAGNOSIS — E876 Hypokalemia: Secondary | ICD-10-CM | POA: Diagnosis not present

## 2021-02-01 DIAGNOSIS — L03319 Cellulitis of trunk, unspecified: Secondary | ICD-10-CM | POA: Diagnosis not present

## 2021-02-01 DIAGNOSIS — E669 Obesity, unspecified: Secondary | ICD-10-CM | POA: Diagnosis present

## 2021-02-01 DIAGNOSIS — Z9049 Acquired absence of other specified parts of digestive tract: Secondary | ICD-10-CM

## 2021-02-01 DIAGNOSIS — G4700 Insomnia, unspecified: Secondary | ICD-10-CM | POA: Diagnosis not present

## 2021-02-01 DIAGNOSIS — L02211 Cutaneous abscess of abdominal wall: Secondary | ICD-10-CM | POA: Diagnosis not present

## 2021-02-01 DIAGNOSIS — Z886 Allergy status to analgesic agent status: Secondary | ICD-10-CM

## 2021-02-01 DIAGNOSIS — R161 Splenomegaly, not elsewhere classified: Secondary | ICD-10-CM | POA: Diagnosis present

## 2021-02-01 DIAGNOSIS — L02216 Cutaneous abscess of umbilicus: Secondary | ICD-10-CM | POA: Diagnosis not present

## 2021-02-01 DIAGNOSIS — Z888 Allergy status to other drugs, medicaments and biological substances status: Secondary | ICD-10-CM

## 2021-02-01 DIAGNOSIS — F1011 Alcohol abuse, in remission: Secondary | ICD-10-CM | POA: Diagnosis not present

## 2021-02-01 DIAGNOSIS — J449 Chronic obstructive pulmonary disease, unspecified: Secondary | ICD-10-CM | POA: Diagnosis not present

## 2021-02-01 DIAGNOSIS — M4807 Spinal stenosis, lumbosacral region: Secondary | ICD-10-CM | POA: Diagnosis not present

## 2021-02-01 DIAGNOSIS — F419 Anxiety disorder, unspecified: Secondary | ICD-10-CM | POA: Diagnosis not present

## 2021-02-01 DIAGNOSIS — L03316 Cellulitis of umbilicus: Secondary | ICD-10-CM | POA: Diagnosis not present

## 2021-02-01 DIAGNOSIS — A419 Sepsis, unspecified organism: Secondary | ICD-10-CM | POA: Diagnosis not present

## 2021-02-01 DIAGNOSIS — L0291 Cutaneous abscess, unspecified: Secondary | ICD-10-CM

## 2021-02-01 LAB — COMPREHENSIVE METABOLIC PANEL
ALT: 22 U/L (ref 0–44)
AST: 38 U/L (ref 15–41)
Albumin: 2.2 g/dL — ABNORMAL LOW (ref 3.5–5.0)
Alkaline Phosphatase: 123 U/L (ref 38–126)
Anion gap: 8 (ref 5–15)
BUN: 11 mg/dL (ref 8–23)
CO2: 34 mmol/L — ABNORMAL HIGH (ref 22–32)
Calcium: 8.5 mg/dL — ABNORMAL LOW (ref 8.9–10.3)
Chloride: 93 mmol/L — ABNORMAL LOW (ref 98–111)
Creatinine, Ser: 1.27 mg/dL — ABNORMAL HIGH (ref 0.44–1.00)
GFR, Estimated: 47 mL/min — ABNORMAL LOW (ref >60)
Glucose, Bld: 110 mg/dL — ABNORMAL HIGH (ref 70–99)
Potassium: 3.4 mmol/L — ABNORMAL LOW (ref 3.5–5.1)
Sodium: 135 mmol/L (ref 135–145)
Total Bilirubin: 1.6 mg/dL — ABNORMAL HIGH (ref 0.3–1.2)
Total Protein: 6.6 g/dL (ref 6.5–8.1)

## 2021-02-01 LAB — CBC
HCT: 40.2 % (ref 36.0–46.0)
Hemoglobin: 13.4 g/dL (ref 12.0–15.0)
MCH: 33.3 pg (ref 26.0–34.0)
MCHC: 33.3 g/dL (ref 30.0–36.0)
MCV: 99.8 fL (ref 80.0–100.0)
Platelets: 123 10*3/uL — ABNORMAL LOW (ref 150–400)
RBC: 4.03 MIL/uL (ref 3.87–5.11)
RDW: 15.6 % — ABNORMAL HIGH (ref 11.5–15.5)
WBC: 7.6 10*3/uL (ref 4.0–10.5)
nRBC: 0 % (ref 0.0–0.2)

## 2021-02-01 LAB — LIPASE, BLOOD: Lipase: 28 U/L (ref 11–51)

## 2021-02-01 MED ORDER — IOHEXOL 300 MG/ML  SOLN
100.0000 mL | Freq: Once | INTRAMUSCULAR | Status: AC | PRN
  Administered 2021-02-01: 100 mL via INTRAVENOUS

## 2021-02-01 NOTE — ED Triage Notes (Signed)
Pt BIB [facility] from home, c/o wound with drainage below her belly button. Reports she has been seen at [facility] for same, states drainage worsened today with standing.

## 2021-02-01 NOTE — ED Provider Triage Note (Signed)
Emergency Medicine Provider Triage Evaluation Note  Carla Leonard , a 66 y.o. female  was evaluated in triage.  Pt complains of abdominal pain for several months.  Patient states that she was recently seen at [facility] for similar symptoms, they put her on antibiotics and pain medication and discharged her home.  She states that she is having difficulty filling this medication, and continues to feel worse.  She also complaining of "drainage" from around her bellybutton, that she believes is urine.  Review of Systems  Positive: Abdominal pain, wound drainage Negative: Fever, chills, nausea, vomiting  Physical Exam  BP 91/62    Pulse 61    Temp 98 F (36.7 C)    Resp 16    SpO2 98%  Gen:   Awake, no distress   Resp:  Normal effort  MSK:   Moves extremities without difficulty  Other:    Medical Decision Making  Medically screening exam initiated at 4:37 PM.  Appropriate orders placed.  Carla Leonard was informed that the remainder of the evaluation will be completed by another provider, this initial triage assessment does not replace that evaluation, and the importance of remaining in the ED until their evaluation is complete.     Kateri Plummer, PA-C 02/01/21 1644

## 2021-02-02 ENCOUNTER — Inpatient Hospital Stay (HOSPITAL_COMMUNITY): Payer: Medicare HMO

## 2021-02-02 ENCOUNTER — Encounter (HOSPITAL_COMMUNITY): Payer: Self-pay | Admitting: Internal Medicine

## 2021-02-02 DIAGNOSIS — E669 Obesity, unspecified: Secondary | ICD-10-CM | POA: Diagnosis present

## 2021-02-02 DIAGNOSIS — R791 Abnormal coagulation profile: Secondary | ICD-10-CM | POA: Diagnosis present

## 2021-02-02 DIAGNOSIS — E8809 Other disorders of plasma-protein metabolism, not elsewhere classified: Secondary | ICD-10-CM

## 2021-02-02 DIAGNOSIS — J449 Chronic obstructive pulmonary disease, unspecified: Secondary | ICD-10-CM | POA: Diagnosis present

## 2021-02-02 DIAGNOSIS — N183 Chronic kidney disease, stage 3 unspecified: Secondary | ICD-10-CM | POA: Diagnosis present

## 2021-02-02 DIAGNOSIS — D696 Thrombocytopenia, unspecified: Secondary | ICD-10-CM

## 2021-02-02 DIAGNOSIS — N179 Acute kidney failure, unspecified: Secondary | ICD-10-CM

## 2021-02-02 DIAGNOSIS — F32A Depression, unspecified: Secondary | ICD-10-CM

## 2021-02-02 DIAGNOSIS — N1831 Chronic kidney disease, stage 3a: Secondary | ICD-10-CM | POA: Diagnosis not present

## 2021-02-02 DIAGNOSIS — F1011 Alcohol abuse, in remission: Secondary | ICD-10-CM | POA: Diagnosis not present

## 2021-02-02 DIAGNOSIS — E872 Acidosis, unspecified: Secondary | ICD-10-CM | POA: Diagnosis not present

## 2021-02-02 DIAGNOSIS — E876 Hypokalemia: Secondary | ICD-10-CM

## 2021-02-02 DIAGNOSIS — F419 Anxiety disorder, unspecified: Secondary | ICD-10-CM

## 2021-02-02 DIAGNOSIS — I129 Hypertensive chronic kidney disease with stage 1 through stage 4 chronic kidney disease, or unspecified chronic kidney disease: Secondary | ICD-10-CM | POA: Diagnosis present

## 2021-02-02 DIAGNOSIS — E785 Hyperlipidemia, unspecified: Secondary | ICD-10-CM | POA: Diagnosis present

## 2021-02-02 DIAGNOSIS — E039 Hypothyroidism, unspecified: Secondary | ICD-10-CM | POA: Diagnosis present

## 2021-02-02 DIAGNOSIS — G40909 Epilepsy, unspecified, not intractable, without status epilepticus: Secondary | ICD-10-CM | POA: Diagnosis not present

## 2021-02-02 DIAGNOSIS — L02219 Cutaneous abscess of trunk, unspecified: Secondary | ICD-10-CM

## 2021-02-02 DIAGNOSIS — L0390 Cellulitis, unspecified: Secondary | ICD-10-CM | POA: Diagnosis not present

## 2021-02-02 DIAGNOSIS — R161 Splenomegaly, not elsewhere classified: Secondary | ICD-10-CM | POA: Diagnosis present

## 2021-02-02 DIAGNOSIS — R188 Other ascites: Secondary | ICD-10-CM | POA: Diagnosis not present

## 2021-02-02 DIAGNOSIS — I959 Hypotension, unspecified: Secondary | ICD-10-CM | POA: Diagnosis present

## 2021-02-02 DIAGNOSIS — L03311 Cellulitis of abdominal wall: Secondary | ICD-10-CM | POA: Diagnosis not present

## 2021-02-02 DIAGNOSIS — A419 Sepsis, unspecified organism: Secondary | ICD-10-CM

## 2021-02-02 DIAGNOSIS — Z20822 Contact with and (suspected) exposure to covid-19: Secondary | ICD-10-CM | POA: Diagnosis present

## 2021-02-02 DIAGNOSIS — M1990 Unspecified osteoarthritis, unspecified site: Secondary | ICD-10-CM | POA: Diagnosis present

## 2021-02-02 DIAGNOSIS — E2740 Unspecified adrenocortical insufficiency: Secondary | ICD-10-CM | POA: Diagnosis present

## 2021-02-02 DIAGNOSIS — L03319 Cellulitis of trunk, unspecified: Secondary | ICD-10-CM | POA: Diagnosis present

## 2021-02-02 DIAGNOSIS — F17210 Nicotine dependence, cigarettes, uncomplicated: Secondary | ICD-10-CM | POA: Diagnosis present

## 2021-02-02 DIAGNOSIS — K921 Melena: Secondary | ICD-10-CM | POA: Diagnosis present

## 2021-02-02 LAB — LACTIC ACID, PLASMA
Lactic Acid, Venous: 2.1 mmol/L (ref 0.5–1.9)
Lactic Acid, Venous: 1.9 mmol/L (ref 0.5–1.9)

## 2021-02-02 LAB — COMPREHENSIVE METABOLIC PANEL
ALT: 19 U/L (ref 0–44)
AST: 37 U/L (ref 15–41)
Albumin: 2.2 g/dL — ABNORMAL LOW (ref 3.5–5.0)
Alkaline Phosphatase: 92 U/L (ref 38–126)
Anion gap: 11 (ref 5–15)
BUN: 12 mg/dL (ref 8–23)
CO2: 28 mmol/L (ref 22–32)
Calcium: 7.7 mg/dL — ABNORMAL LOW (ref 8.9–10.3)
Chloride: 98 mmol/L (ref 98–111)
Creatinine, Ser: 1.18 mg/dL — ABNORMAL HIGH (ref 0.44–1.00)
GFR, Estimated: 51 mL/min — ABNORMAL LOW (ref >60)
Glucose, Bld: 93 mg/dL (ref 70–99)
Potassium: 3.5 mmol/L (ref 3.5–5.1)
Sodium: 137 mmol/L (ref 135–145)
Total Bilirubin: 1.3 mg/dL — ABNORMAL HIGH (ref 0.3–1.2)
Total Protein: 5.4 g/dL — ABNORMAL LOW (ref 6.5–8.1)

## 2021-02-02 LAB — URINALYSIS, MICROSCOPIC (REFLEX)

## 2021-02-02 LAB — RESP PANEL BY RT-PCR (FLU A&B, COVID) ARPGX2
Influenza A by PCR: NEGATIVE
Influenza B by PCR: NEGATIVE
SARS Coronavirus 2 by RT PCR: NEGATIVE

## 2021-02-02 LAB — HEMOGLOBIN AND HEMATOCRIT, BLOOD
HCT: 40.2 % (ref 36.0–46.0)
Hemoglobin: 12.6 g/dL (ref 12.0–15.0)

## 2021-02-02 LAB — URINALYSIS, ROUTINE W REFLEX MICROSCOPIC
Bilirubin Urine: NEGATIVE
Glucose, UA: NEGATIVE mg/dL
Ketones, ur: NEGATIVE mg/dL
Nitrite: NEGATIVE
Protein, ur: NEGATIVE mg/dL
Specific Gravity, Urine: <1.005 — ABNORMAL LOW (ref 1.005–1.030)
pH: 6.5 (ref 5.0–8.0)

## 2021-02-02 LAB — POC OCCULT BLOOD, ED: Fecal Occult Bld: POSITIVE — AB

## 2021-02-02 LAB — TYPE AND SCREEN
ABO/RH(D): O POS
Antibody Screen: NEGATIVE

## 2021-02-02 LAB — PROTIME-INR
INR: 1.5 — ABNORMAL HIGH (ref 0.8–1.2)
Prothrombin Time: 18 seconds — ABNORMAL HIGH (ref 11.4–15.2)

## 2021-02-02 LAB — MAGNESIUM: Magnesium: 1.8 mg/dL (ref 1.7–2.4)

## 2021-02-02 LAB — AMMONIA: Ammonia: 33 umol/L (ref 9–35)

## 2021-02-02 MED ORDER — ONDANSETRON HCL 4 MG PO TABS: 4.0000 mg | ORAL_TABLET | Freq: Four times a day (QID) | ORAL | Status: DC | PRN

## 2021-02-02 MED ORDER — SODIUM CHLORIDE 0.9 % IV SOLN: 2.0000 g | Freq: Three times a day (TID) | INTRAVENOUS | Status: DC

## 2021-02-02 MED ORDER — PANTOPRAZOLE SODIUM 40 MG IV SOLR
40.0000 mg | Freq: Two times a day (BID) | INTRAVENOUS | Status: DC
  Administered 2021-02-02 – 2021-02-04 (×5): 40 mg via INTRAVENOUS
  Filled 2021-02-02 (×5): qty 40

## 2021-02-02 MED ORDER — LEVETIRACETAM 500 MG PO TABS
500.0000 mg | ORAL_TABLET | Freq: Two times a day (BID) | ORAL | Status: DC
  Administered 2021-02-02 – 2021-02-05 (×7): 500 mg via ORAL
  Filled 2021-02-02 (×7): qty 1

## 2021-02-02 MED ORDER — ACETAMINOPHEN 650 MG RE SUPP: 650.0000 mg | Freq: Four times a day (QID) | RECTAL | Status: DC | PRN

## 2021-02-02 MED ORDER — SODIUM CHLORIDE 0.9 % IV SOLN
2.0000 g | INTRAVENOUS | Status: DC
  Administered 2021-02-02 – 2021-02-03 (×2): 2 g via INTRAVENOUS
  Filled 2021-02-02 (×2): qty 20

## 2021-02-02 MED ORDER — SODIUM CHLORIDE 0.9 % IV BOLUS
250.0000 mL | Freq: Once | INTRAVENOUS | Status: AC
  Administered 2021-02-02: 250 mL via INTRAVENOUS

## 2021-02-02 MED ORDER — ALPRAZOLAM 0.5 MG PO TABS
0.5000 mg | ORAL_TABLET | Freq: Two times a day (BID) | ORAL | Status: DC | PRN
  Administered 2021-02-03 – 2021-02-04 (×2): 0.5 mg via ORAL
  Filled 2021-02-02 (×2): qty 1

## 2021-02-02 MED ORDER — SODIUM CHLORIDE 0.9 % IV BOLUS
1000.0000 mL | Freq: Once | INTRAVENOUS | Status: AC
  Administered 2021-02-02: 1000 mL via INTRAVENOUS

## 2021-02-02 MED ORDER — ONDANSETRON HCL 4 MG/2ML IJ SOLN: 4.0000 mg | Freq: Four times a day (QID) | INTRAMUSCULAR | Status: DC | PRN

## 2021-02-02 MED ORDER — POTASSIUM CHLORIDE CRYS ER 20 MEQ PO TBCR
40.0000 meq | EXTENDED_RELEASE_TABLET | ORAL | Status: AC
  Administered 2021-02-02: 40 meq via ORAL
  Filled 2021-02-02: qty 2

## 2021-02-02 MED ORDER — ALBUMIN HUMAN 25 % IV SOLN
25.0000 g | INTRAVENOUS | Status: AC
  Administered 2021-02-02: 25 g via INTRAVENOUS
  Filled 2021-02-02: qty 100

## 2021-02-02 MED ORDER — LIDOCAINE HCL 1 % IJ SOLN
INTRAMUSCULAR | Status: AC
  Filled 2021-02-02: qty 20

## 2021-02-02 MED ORDER — ACETAMINOPHEN 325 MG PO TABS: 650.0000 mg | ORAL_TABLET | Freq: Four times a day (QID) | ORAL | Status: DC | PRN

## 2021-02-02 MED ORDER — ALBUMIN HUMAN 25 % IV SOLN: 12.5000 g | Freq: Once | INTRAVENOUS | Status: DC

## 2021-02-02 MED ORDER — SODIUM CHLORIDE 0.9 % IV SOLN: INTRAVENOUS | Status: DC

## 2021-02-02 MED ORDER — ENSURE ENLIVE PO LIQD
237.0000 mL | Freq: Two times a day (BID) | ORAL | Status: DC
  Administered 2021-02-02 – 2021-02-05 (×5): 237 mL via ORAL
  Filled 2021-02-02: qty 237

## 2021-02-02 MED ORDER — ALBUTEROL SULFATE (2.5 MG/3ML) 0.083% IN NEBU: 2.5000 mg | INHALATION_SOLUTION | Freq: Four times a day (QID) | RESPIRATORY_TRACT | Status: DC | PRN

## 2021-02-02 MED ORDER — VANCOMYCIN HCL IN DEXTROSE 1-5 GM/200ML-% IV SOLN: 1000.0000 mg | INTRAVENOUS | Status: DC

## 2021-02-02 MED ORDER — ENOXAPARIN SODIUM 40 MG/0.4ML IJ SOSY: 40.0000 mg | PREFILLED_SYRINGE | Freq: Every day | INTRAMUSCULAR | Status: DC

## 2021-02-02 MED ORDER — MOMETASONE FURO-FORMOTEROL FUM 200-5 MCG/ACT IN AERO
2.0000 | INHALATION_SPRAY | Freq: Two times a day (BID) | RESPIRATORY_TRACT | Status: DC
  Administered 2021-02-02 – 2021-02-05 (×7): 2 via RESPIRATORY_TRACT
  Filled 2021-02-02: qty 8.8

## 2021-02-02 MED ORDER — TRAZODONE HCL 50 MG PO TABS
100.0000 mg | ORAL_TABLET | Freq: Every day | ORAL | Status: DC
  Administered 2021-02-02: 100 mg via ORAL
  Filled 2021-02-02: qty 2

## 2021-02-02 MED ORDER — SODIUM CHLORIDE 0.9% FLUSH
3.0000 mL | Freq: Two times a day (BID) | INTRAVENOUS | Status: DC
  Administered 2021-02-02 – 2021-02-04 (×5): 3 mL via INTRAVENOUS

## 2021-02-02 MED ORDER — SODIUM CHLORIDE 0.9 % IV BOLUS
500.0000 mL | Freq: Once | INTRAVENOUS | Status: AC
  Administered 2021-02-02: 500 mL via INTRAVENOUS

## 2021-02-02 MED ORDER — VANCOMYCIN HCL 1500 MG/300ML IV SOLN
1500.0000 mg | Freq: Once | INTRAVENOUS | Status: AC
  Administered 2021-02-02: 1500 mg via INTRAVENOUS
  Filled 2021-02-02: qty 300

## 2021-02-02 MED ORDER — METRONIDAZOLE 500 MG/100ML IV SOLN
500.0000 mg | Freq: Two times a day (BID) | INTRAVENOUS | Status: DC
  Administered 2021-02-02 – 2021-02-04 (×4): 500 mg via INTRAVENOUS
  Filled 2021-02-02 (×4): qty 100

## 2021-02-02 NOTE — Progress Notes (Signed)
Pharmacy Antibiotic Note  Carla Leonard is a 66 y.o. female admitted on 02/01/2021 with cellulitis.  Pharmacy has been consulted for Vancomycin  dosing.  Vancomycin 1 g IV given in ED at  0600  Plan: Vancomycin 1000 mg IV q24h  Height: 5\' 1"  (154.9 cm) Weight: 90.7 kg (200 lb) IBW/kg (Calculated) : 47.8  Temp (24hrs), Avg:98 F (36.7 C), Min:98 F (36.7 C), Max:98 F (36.7 C)  Recent Labs  Lab 02/01/21 1656 02/02/21 0357  WBC 7.6  --   CREATININE 1.27*  --   LATICACIDVEN  --  2.1*    Estimated Creatinine Clearance: 45.3 mL/min (A) (by C-G formula based on SCr of 1.27 mg/dL (H)).    Allergies  Allergen Reactions   Sulfa Antibiotics Anaphylaxis   Nsaids Other (See Comments)    Stomach pain, diarrhea   Other Other (See Comments)    Steroids: hallucinations    Caryl Pina 02/02/2021 7:11 AM

## 2021-02-02 NOTE — Progress Notes (Addendum)
Reviewed consultants notes and per recommendations will broaden coverage of antibiotics in the interim setting with Rocephin and metronidazole.  IR consulted for paracentesis with fluid analysis and for aspiration with culture of what is suspected to be a possible abscess on CT imaging.  Patient was evaluated by IR, but reported not to have a significant amount of fluid to drain with paracentesis or of the periumbilical fluid collection thought to be likely a phlegmon.

## 2021-02-02 NOTE — ED Notes (Signed)
Pt BP began decreasing this AM and pt became sleepy and difficult to arouse, MD made aware. Bolus fluids started

## 2021-02-02 NOTE — ED Notes (Addendum)
Pt was little unsteady but slow walking to bathroom with assistance

## 2021-02-02 NOTE — Consult Note (Signed)
NAME:  Carla Leonard, MRN:  [id number], DOB:  May 27, 1955, LOS: 0 ADMISSION DATE:  02/01/2021, CONSULTATION DATE:  02/02/2021 REFERRING MD:  Dr. Tamala Julian, CHIEF COMPLAINT:  Hypotension   History of Present Illness:   66 year old female with medical history significant for tobacco abuse, possible COPD, HTN, HLD, CKD IIIa, seizure like activity, and anxiety/ depression/ bipolar who presented to [facility] ER on 1/30 with worsening drainage from/ around her bellybutton.    Reports abdominal swelling for around 3 months but on 01/24/21 it started to drain fluid and has since.  She was seen at [facility] ER for same and appears to be sent home with cephalexin, however patient states she was never sent home on antibiotics.  Has had some epigastric pain but otherwise, no pain at or around umbilicus, denies fever, chills, SOB, nausea, vomiting or diarrhea.    In ER, she has been afebrile, in NSR, normal oxygen saturations however blood pressures have been low and liable.  Labs noted for K 3.5, sCr 1.27> 1.18, lactic 2.1> 1.9, INR 1.5, Hgb 13.4, WBC 7.6 and benign differential, plts 123, and FOBT positive.  Underwent a CT abd/ pelvis which showed a small enhancing fluid collections in the subcutaneous tissues at the level of the umbilicus measuring 0.3K 0.9x 2.2 cm, abscess not excluded, but no evidence of soft tissue gas with associated anterior abdominal wall subcutaneous stranding and skin thickening.   Blood cultures sent and started on vancomycin.  For hypotension, she has been given 2L NS and albumin however blood pressures have continued to range from 70/27 to 107/83 if cuff pressures are reading accurately.  She was found to be lethargic around 0700 and patient had admitted to taking her home xanax out of her purse.  She is currently on her third liter of NS.  GI has been consulted for rule out GI bleed.  PCCM consulted for concern of ongoing hypotension.  Pertinent  Medical History  Tobacco abuse, ? COPD,  Hypertension, HLD, CKDIIIa, hypothyroidism, anxiety, depression/ bipolar, seizure like activity, nephrolithiasis, DDD, insomnia   Significant Hospital Events: Including procedures, antibiotic start and stop dates in addition to other pertinent events   1/31 admitted to [facility], started on vanc, GI consulted  Interim History / Subjective:   Objective   Blood pressure (!) 77/41, pulse 65, temperature 98 F (36.7 C), resp. rate 14, height 5\' 1"  (1.549 m), weight 90.7 kg, SpO2 100 %.        Intake/Output Summary (Last 24 hours) at 02/02/2021 1157 Last data filed at 02/02/2021 1001 Gross per 24 hour  Intake 1127.2 ml  Output --  Net 1127.2 ml   Filed Weights   02/02/21 0527  Weight: 90.7 kg   Examination: General:  chronically ill appearing female lying in bed in NAD HEENT: MM pink/moist, wearing glasses, edentulous  Neuro:  Alert, oriented, appropriate, MAE CV: rr, NSR in 60's PULM:  non labored, room air, CTA, no wheeze GI: obese, +bs, NT, no spont or expressible drainage in or around umbilicus, no erythema or warmth, no palpable mass.  purwick Extremities: warm/dry, trace LE edema with chronic venous changes Skin: bruising to B arms, multiple tattoos   Resolved Hospital Problem list    Assessment & Plan:   Hypotension  - currently, blood pressures have been inaccurate at times given placement of cuff sliding down arm, etc.  Cuff pressure taken on right arm personally was 95/61 (72).  Her mental status is intact and repeat labs showing lactate  2.1 > 1.9 and improving sCr 1.27> 1.18 indicate improving end organ function.  At this time, she does not meet ICU criteria.     Periumbilical cellulitis with possible abscess  - CT abd/ pelvis> small enhancing fluid collections in the subcutaneous tissues at the level of the umbilicus measuring 9.5M 0.9x 2.2 cm, abscess not excluded, but no evidence of soft tissue gas with associated anterior abdominal wall subcutaneous stranding and skin  thickening - evaluated by surgery and at this time does not recommend drainage  - continue abx per primary team and follow cultures     Ascites + FOBT Thrombocytopenia with splenomegaly  - being evaluated by GI    Remainder per primary team/ TRH.  PCCM will sign off.  Please call us back if we can be of any further assistance or if clinical change.  Labs   CBC: Recent Labs  Lab 02/01/21 1656  WBC 7.6  HGB 13.4  HCT 40.2  MCV 99.8  PLT 123*    Basic Metabolic Panel: Recent Labs  Lab 02/01/21 1656 02/02/21 1038  NA 135 137  K 3.4* 3.5  CL 93* 98  CO2 34* 28  GLUCOSE 110* 93  BUN 11 12  CREATININE 1.27* 1.18*  CALCIUM 8.5* 7.7*  MG  --  1.8   GFR: Estimated Creatinine Clearance: 48.8 mL/min (A) (by C-G formula based on SCr of 1.18 mg/dL (H)). Recent Labs  Lab 02/01/21 1656 02/02/21 0357  WBC 7.6  --   LATICACIDVEN  --  2.1*    Liver Function Tests: Recent Labs  Lab 02/01/21 1656 02/02/21 1038  AST 38 37  ALT 22 19  ALKPHOS 123 92  BILITOT 1.6* 1.3*  PROT 6.6 5.4*  ALBUMIN 2.2* 2.2*   Recent Labs  Lab 02/01/21 1656  LIPASE 28   Recent Labs  Lab 02/02/21 1038  AMMONIA 33    ABG No results found for: PHART, PCO2ART, PO2ART, HCO3, TCO2, ACIDBASEDEF, O2SAT   Coagulation Profile: Recent Labs  Lab 02/02/21 0422  INR 1.5*    Cardiac Enzymes: No results for input(s): CKTOTAL, CKMB, CKMBINDEX, TROPONINI in the last 168 hours.  HbA1C: No results found for: HGBA1C  CBG: No results for input(s): GLUCAP in the last 168 hours.  Review of Systems:   Review of Systems  Constitutional:  Negative for chills and fever.  Respiratory:  Negative for sputum production, shortness of breath and wheezing.   Cardiovascular:  Positive for leg swelling. Negative for chest pain.  Gastrointestinal:  Positive for abdominal pain. Negative for diarrhea, nausea and vomiting.  Neurological:  Negative for focal weakness, seizures and loss of consciousness.    Past Medical History:  She,  has a past medical history of Arthritis, Depression, History of kidney stones, and Hypertension.   Surgical History:   Past Surgical History:  Procedure Laterality Date   APPENDECTOMY     CHOLECYSTECTOMY     ELBOW SURGERY     RIGHT   JOINT REPLACEMENT     right TKA 06/2010   KNEE ARTHROPLASTY  06/17/2011   Procedure: COMPUTER ASSISTED TOTAL KNEE ARTHROPLASTY;  Surgeon: Alta Corning, MD;  Location: [place];  Service: Orthopedics;  Laterality: Left;  TOTAL KNEE REPLACEMENT WITH GENERAL ANESTHESIA AND PRE OP FEMORAL NERVE BLOCK   PERIANAL CYST       Social History:   reports that she has been smoking. She does not have any smokeless tobacco history on file. She reports current alcohol use. She reports that  she does not use drugs.   Family History:  Her family history is not on file.   Allergies Allergies  Allergen Reactions   Sulfa Antibiotics Anaphylaxis   Prednisone Other (See Comments)    hallucinations   Nsaids Other (See Comments)    Stomach pain, diarrhea   Other Other (See Comments)    Steroids: hallucinations     Home Medications  Prior to Admission medications   Medication Sig Start Date End Date Taking? Authorizing Provider  ALPRAZolam Duanne Moron) 1 MG tablet Take 0.5 mg by mouth See admin instructions. Take 1/2 tablet (0.5 mg) by mouth every morning and at bedtime (with trazodone); may also take 1/2 tablet (0.5 mg) twice daily as needed for anxiety 01/25/21  Yes [provider]  budesonide-formoterol (SYMBICORT) 160-4.5 MCG/ACT inhaler Inhale 2 puffs into the lungs 2 (two) times daily as needed (shortness of breath/wheezing). 01/20/21  Yes [provider]  cephALEXin (KEFLEX) 500 MG capsule Take 500 mg by mouth every 6 (six) hours. 01/25/21  Yes [provider]  levETIRAcetam (KEPPRA) 500 MG tablet Take 500 mg by mouth 2 (two) times daily. 01/20/21  Yes [provider]  metoprolol succinate (TOPROL-XL) 50 MG  24 hr tablet Take 50 mg by mouth every morning. 01/20/21  Yes [provider]  primidone (MYSOLINE) 50 MG tablet Take 100 mg by mouth every morning. 01/21/21  Yes [provider]  promethazine (PHENERGAN) 12.5 MG tablet Take 12.5 mg by mouth 2 (two) times daily as needed for nausea or vomiting. 01/20/21  Yes [provider]  spironolactone (ALDACTONE) 50 MG tablet Take 50 mg by mouth 2 (two) times daily. 01/25/21  Yes [provider]  traZODone (DESYREL) 100 MG tablet Take 100 mg by mouth at bedtime. Take with 0.5 mg alprazolam 01/20/21  Yes [provider]  furosemide (LASIX) 20 MG tablet Take 20 mg by mouth daily. Patient not taking: Reported on 02/02/2021 02/01/21   [provider]     Critical care time: n/a     Kennieth Rad, ACNP [facility] 02/02/2021, 1:41 PM

## 2021-02-02 NOTE — ED Notes (Signed)
Pt BP's are improving, NP moved cough to wrist and is getting better BP's. Pt has almost completed 1 L of fluid and alertness has improved. Pt is sitting on the side of the bed to relieve some pressure from hip and is stable.

## 2021-02-02 NOTE — Consult Note (Signed)
[facility] Consult: 12:05 PM 02/02/2021  LOS: 0 days    Referring Provider: Dr Harvest Forest  Primary Care Physician:  Bonnita Nasuti, MD Primary Gastroenterologist:  unassigned    Reason for Consultation:  FOBT + anemia.  Ascites, no cirrhosis.     HPI: Carla Leonard is a 66 y.o. female.  See PMH below.  Hypertension.  Hypothyroidism.  Thrombocytopenia.  Seizures.  Anxiety/depression.  Open cholecystectomy and appendectomy in the late 1970s. Cologuard negative within the past 12 months.  No previous endoscopy or colonoscopy.  No prior history of liver disease.  Abdominal swelling started 3 months ago.  Clear leaking from umbilicus started 4 weeks ago.  Some epigastric pain for 18 months.  No nausea or vomiting.  For several years has had intermittent spells of anorexia and does not eat much, has dropped about 99 pounds over 4 to 5 years.  Alternating constipation, diarrhea.  She can go several days between bowel movements but has never seen any blood or melena. Seen at [facility] ED on January 22, January 23 for the drainage and abdominal swelling.  Keflex prescribed.  Imaging obtained but do not have access to those records.  Did not have paracentesis.  Plan was referral to GI Dr. Lyda Jester but this has not happened yet.  In the last few months prescribed Lasix, Aldactone for abdominal swelling.  Drainage from wound at the bellybutton for a few weeks along with redness of the regional skin.  She was evaluated at [facility] ED for this on January 24, 2018 third and prescribed Keflex Presented to the ED yesterday afternoon for evaluation of drainage, including pus from wound at her bellybutton. Hypotension.  Readings as low as 70/27 and now generally in the mid to upper 80s/40s to 50s.  Heart rate in the 60s.  Sats  anywhere from 83% to 100% on room air. T bili 1.3.  Normal alk phos, transaminases.  Albumin 2.2.  Lactic acid 2.1 Hgb 13.4.  MCV 99.  Platelets 123.  INR 1.5.  FOBT positive Contrasted CTAP: S/p cholecystectomy, mild, stable intra and extrahepatic biliary ductal dilatation.  No focal liver lesions.  Fluid collections at umbilicus measuring 3 x 1 x 2.2.  Abscess not excluded. SQ stranding and skin thickening of anterior abdominal wall.  Decreased ascites compared with 3 weeks ago.  Stable splenomegaly.  Concern given the ascites, splenomegaly, thrombocytopenia that she has cirrhosis but CT scan has not confirmed this.  Patient has been started on vancomycin.  ECM saw the patient regarding hypotension.  Surgery now seeing for evaluation of the abdominal leaking and fluid collections.  Family history of diabetes, leg amputation in her father.  Strong family history of obesity.  Brain tumor and an aunt who died at age 52.  No family history of ulcer disease, colorectal cancer, colitis, liver disease.  Widow, lives alone.  Past Medical History:  Diagnosis Date   Arthritis    Depression    History of kidney stones    Hypertension     Past Surgical History:  Procedure  Laterality Date   APPENDECTOMY     CHOLECYSTECTOMY     ELBOW SURGERY     RIGHT   JOINT REPLACEMENT     right TKA 06/2010   KNEE ARTHROPLASTY  06/17/2011   Procedure: COMPUTER ASSISTED TOTAL KNEE ARTHROPLASTY;  Surgeon: Alta Corning, MD;  Location: [place];  Service: Orthopedics;  Laterality: Left;  TOTAL KNEE REPLACEMENT WITH GENERAL ANESTHESIA AND PRE OP FEMORAL NERVE BLOCK   PERIANAL CYST      Prior to Admission medications   Medication Sig Start Date End Date Taking? Authorizing Provider  ALPRAZolam Duanne Moron) 1 MG tablet Take 0.5 mg by mouth See admin instructions. Take 1/2 tablet (0.5 mg) by mouth every morning and at bedtime (with trazodone); may also take 1/2 tablet (0.5 mg) twice daily as needed for anxiety 01/25/21  Yes  [provider]  budesonide-formoterol (SYMBICORT) 160-4.5 MCG/ACT inhaler Inhale 2 puffs into the lungs 2 (two) times daily as needed (shortness of breath/wheezing). 01/20/21  Yes [provider]  cephALEXin (KEFLEX) 500 MG capsule Take 500 mg by mouth every 6 (six) hours. 01/25/21  Yes [provider]  levETIRAcetam (KEPPRA) 500 MG tablet Take 500 mg by mouth 2 (two) times daily. 01/20/21  Yes [provider]  metoprolol succinate (TOPROL-XL) 50 MG 24 hr tablet Take 50 mg by mouth every morning. 01/20/21  Yes [provider]  primidone (MYSOLINE) 50 MG tablet Take 100 mg by mouth every morning. 01/21/21  Yes [provider]  promethazine (PHENERGAN) 12.5 MG tablet Take 12.5 mg by mouth 2 (two) times daily as needed for nausea or vomiting. 01/20/21  Yes [provider]  spironolactone (ALDACTONE) 50 MG tablet Take 50 mg by mouth 2 (two) times daily. 01/25/21  Yes [provider]  traZODone (DESYREL) 100 MG tablet Take 100 mg by mouth at bedtime. Take with 0.5 mg alprazolam 01/20/21  Yes [provider]  furosemide (LASIX) 20 MG tablet Take 20 mg by mouth daily. Patient not taking: Reported on 02/02/2021 02/01/21   [provider]    Scheduled Meds:  feeding supplement  237 mL Oral BID BM   levETIRAcetam  500 mg Oral BID   mometasone-formoterol  2 puff Inhalation BID   pantoprazole (PROTONIX) IV  40 mg Intravenous Q12H   sodium chloride flush  3 mL Intravenous Q12H   traZODone  100 mg Oral QHS   Infusions:  sodium chloride 100 mL/hr at 02/02/21 0813   [START ON 02/03/2021] vancomycin     PRN Meds: acetaminophen **OR** acetaminophen, albuterol, ALPRAZolam, ondansetron **OR** ondansetron (ZOFRAN) IV   Allergies as of 02/01/2021 - Review Complete 02/01/2021  Allergen Reaction Noted   Sulfa antibiotics Anaphylaxis 04/27/2011   Nsaids Other (See Comments) 04/27/2011   Other Other (See Comments) 05/27/2015     No family history on file.  Social History   Socioeconomic History   Marital status: Legally Separated    Spouse name: Not on file   Number of children: Not on file   Years of education: Not on file   Highest education level: Not on file  Occupational History   Not on file  Tobacco Use   Smoking status: Every Day   Smokeless tobacco: Not on file  Substance and Sexual Activity   Alcohol use: Yes   Drug use: No   Sexual activity: Not on file  Other Topics Concern   Not on file  Social History Narrative   Not on file   Social Determinants  of Health   Financial Resource Strain: Not on file  Food Insecurity: Not on file  Transportation Needs: Not on file  Physical Activity: Not on file  Stress: Not on file  Social Connections: Not on file  Intimate Partner Violence: Not on file    REVIEW OF SYSTEMS: Constitutional: No profound weakness or fatigue. ENT:  No nose bleeds Pulm: Denies shortness of breath or cough CV:  No palpitations, no LE edema.  Denies angina. GU: Urine is yellow.  No hematuria, no frequency.  No dysuria. GI: No dysphagia.  See HPI. Heme: Forms purpura easily.  No unusual or excessive bleeding. Transfusions: No prior transfusions. Neuro:  No headaches, no peripheral tingling or numbness.  No dizziness, no syncope. Derm:  No itching, no rash or sores.  Endocrine:  No sweats or chills.  No polyuria or dysuria Immunization: Not queried. Travel:  None beyond local counties in last few months.    PHYSICAL EXAM: Vital signs in last 24 hours: Vitals:   02/02/21 1045 02/02/21 1100  BP: (!) 84/49 (!) 77/41  Pulse: 67 65  Resp: 17 14  Temp:    SpO2: 100% 100%   Wt Readings from Last 3 Encounters:  02/02/21 90.7 kg  06/15/15 123.2 kg  05/28/15 125.7 kg    General: Obese, chronically ill-appearing.  Somewhat pale/sallow complexion. Head: No facial asymmetry or swelling. Eyes: Conjunctiva pink.  No scleral icterus Ears: Not hard of  hearing Nose: No congestion or discharge Mouth: Edentulous with moist, pink, clear mucosa. Neck: Obese, no JVD Lungs: Clear bilaterally with overall reduced breath sounds.  No cough.  No labored breathing at rest Heart: RRR.  No MRG.  S1, S2 present Abdomen: Obese, soft, nontender.  Do not appreciate organomegaly, bruits, hernias.  No discharge or ulcer within or surrounding the umbilicus.  Bowel sounds active.   Rectal: None a bit of hard stool palpable but unable to obtain specimen.  There is a speck of red blood on exam glove. Musc/Skeltl: Scars on both knees consistent with knee replacement.  Weakness observed, patient unable to get herself up from a laying to sitting position on the stretcher even when using her arms to pull her self up. Extremities: Woody changes on the skin of the lower legs.  Well-healed but large area of previous ulcer on anterior right lower leg. Neurologic: Oriented x3.  Moves all 4 limbs without tremor, no gross deficits. Skin: Changes in the lower legs as per extremities. Tattoos: Several on the trunk and limbs.   Psych: Very talkative, cooperative.  No agitation.  Intake/Output from previous day: No intake/output data recorded. Intake/Output this shift: Total I/O In: 1127.2 [IV Piggyback:1127.2] Out: -   LAB RESULTS: Recent Labs    02/01/21 1656  WBC 7.6  HGB 13.4  HCT 40.2  PLT 123*   BMET Lab Results  Component Value Date   NA 137 02/02/2021   NA 135 02/01/2021   NA 135 11/13/2020   K 3.5 02/02/2021   K 3.4 (L) 02/01/2021   K 3.9 11/13/2020   CL 98 02/02/2021   CL 93 (L) 02/01/2021   CL 104 11/13/2020   CO2 28 02/02/2021   CO2 34 (H) 02/01/2021   CO2 26 11/13/2020   GLUCOSE 93 02/02/2021   GLUCOSE 110 (H) 02/01/2021   GLUCOSE 83 11/13/2020   BUN 12 02/02/2021   BUN 11 02/01/2021   BUN 13 11/13/2020   CREATININE 1.18 (H) 02/02/2021   CREATININE 1.27 (H) 02/01/2021  CREATININE 1.01 (H) 11/13/2020   CALCIUM 7.7 (L) 02/02/2021    CALCIUM 8.5 (L) 02/01/2021   CALCIUM 8.0 (L) 11/13/2020   LFT Recent Labs    02/01/21 1656 02/02/21 1038  PROT 6.6 5.4*  ALBUMIN 2.2* 2.2*  AST 38 37  ALT 22 19  ALKPHOS 123 92  BILITOT 1.6* 1.3*   PT/INR Lab Results  Component Value Date   INR 1.5 (H) 02/02/2021   INR 0.97 06/14/2015   INR 1.41 06/19/2011   Hepatitis Panel No results for input(s): HEPBSAG, HCVAB, HEPAIGM, HEPBIGM in the last 72 hours. C-Diff No components found for: CDIFF Lipase     Component Value Date/Time   LIPASE 28 02/01/2021 1656    Drugs of Abuse  No results found for: LABOPIA, COCAINSCRNUR, LABBENZ, AMPHETMU, THCU, LABBARB   RADIOLOGY STUDIES: CT ABDOMEN PELVIS W CONTRAST  Result Date: 02/01/2021 CLINICAL DATA:  Acute abdominal pain. Wound drainage below belly button. EXAM: CT ABDOMEN AND PELVIS WITH CONTRAST TECHNIQUE: Multidetector CT imaging of the abdomen and pelvis was performed using the standard protocol following bolus administration of intravenous contrast. RADIATION DOSE REDUCTION: This exam was performed according to the departmental dose-optimization program which includes automated exposure control, adjustment of the mA and/or kV according to patient size and/or use of iterative reconstruction technique. CONTRAST:  152m OMNIPAQUE IOHEXOL 300 MG/ML  SOLN COMPARISON:  CT abdomen and pelvis 01/08/2021. FINDINGS: Lower chest: No acute abnormality. Hepatobiliary: Patient is status post cholecystectomy. There is mild intra and extrahepatic biliary ductal dilatation which appears similar to the prior study. No focal liver lesions are identified. Pancreas: Unremarkable. No pancreatic ductal dilatation or surrounding inflammatory changes. Spleen: Mildly enlarged, unchanged. Adrenals/Urinary Tract: There are rounded hypodensities in both kidneys which are too small to characterize, likely cyst. Otherwise, the kidneys, adrenal glands and bladder are within normal limits. Stomach/Bowel: Stomach is  within normal limits. Appendix appears normal. No evidence of bowel wall thickening, distention, or inflammatory changes. The appendix is not visualized. Vascular/Lymphatic: Aortic atherosclerosis. No enlarged abdominal or pelvic lymph nodes. Prominent pericardial lymph node measures 7 mm. Reproductive: Uterus and bilateral adnexa are unremarkable. Other: There is a small amount of free fluid in the pelvis. Ascites has significantly decreased. Minimal fluid tracking along the bilateral pericolic gutters. Central mesenteric haziness. No focal body wall edema. There is anterior body wall subcutaneous stranding and skin thickening. There is a small enhancing fluid collection in the subcutaneous tissues the level of the inferior umbilicus measuring 3.1 by 0.9 x 2.2 cm. No evidence for soft tissue gas. Musculoskeletal: Multilevel degenerative changes affect the spine. IMPRESSION: 1. Small enhancing fluid collections in the subcutaneous tissues at the level of the umbilicus measuring 3.1 x 0.9 x 2.2 cm. Abscess not excluded. No evidence for soft tissue gas. 2. Anterior abdominal wall subcutaneous stranding and skin thickening may be related to cellulitis. 3. Significant decrease in ascites.  Mild ascites persists. 4. Stable splenomegaly. 5.  Aortic Atherosclerosis (ICD10-I70.0). Electronically Signed   By: ARonney AstersM.D.   On: 02/01/2021 20:34      IMPRESSION:   Ascites.  Has not had paracentesis.  CT shows stable intra/extrahepatic biliary ductal dilatation.  Does not confirm a diagnosis of cirrhosis.  Other than slight elevation of T bili her transaminases are normal.  Gallbladder was removed decades ago.  Thrombocytopenia.  Splenomegaly.  Ascites with drainage via umbilicus in recent weeks.  CT scan confirms periumbilical fluid collections, subcutaneous stranding and skin thickening in the abdominal wall.  Abscesses  not excluded, vancomycin administered.  Surgery is following.  FOBT positive.  No recent  melena or bleeding per rectum.  However today on exam there was a speck or 2 of red blood without palpable mass, visible or palpable hemorrhoids.  Hypotension, has not responded to fluid boluses.    PLAN:     Will need colonoscopy and possibly EGD, this can be done in a few days prior to discharge.  I do not think an ultrasound is going to get a good look at her liver given her obesity but consider this to further evaluate the liver.  Order paracentesis with fluid studies to rule out infectious versus malignant ascites.   Azucena Freed  02/02/2021, 12:05 PM Phone [phone]

## 2021-02-02 NOTE — Consult Note (Addendum)
[facility] Surgery Consult Note  Carla Leonard 20-Aug-1955  [id number].    Requesting MD: Fuller Plan Chief Complaint/Reason for Consult: abdominal cellulitis  HPI:  Carla Leonard is a 66yo female PMH HTN, HLD, CKD, hypothyroidism, seizures, and anxiety/ depression who presented to the ED complaining of 3 months of abdominal swelling. She also reports 3-4 weeks of intermittent clear/ yellow drainage from her umbilicus. Denies nausea, vomiting, fever, or chills. She was seen in the ED at [facility] on 01/24/21 and discharged home with keflex - unsure if patient was taking this as prescribed. States that she has had persistent drainage and now worsening erythema on her abdomen. Due to persistent symptoms she came to the ED last night. In the ED patient was found to be hypotensive, afebrile. Labs noted for Cr 1.27> 1.18, lactic 2.1> 1.9, INR 1.5, Hgb 13.4, WBC 7.6, plts 123, and FOBT positive. CT abd/pelvis showed a small enhancing fluid collection in the subcutaneous tissues at the level of the umbilicus measuring 9.5A 0.9x 2.2 cm, abscess not excluded, but no evidence of soft tissue gas with associated anterior abdominal wall subcutaneous stranding and skin thickening.  Patient is being admitted to the medical service. GI has been consulted for rule out GI bleed.  PCCM consulted for concern of ongoing hypotension. General surgery asked to see regarding possible subcutaneous abscess.  Abdominal surgical history: open cholecystectomy/ appendectomy, c section Anticoagulants: none  Review of Systems  Constitutional:  Negative for fever.  Gastrointestinal:  Positive for abdominal pain.  Skin:  Positive for rash.   All systems reviewed and otherwise negative except for as above  History reviewed. No pertinent family history.  Past Medical History:  Diagnosis Date   Arthritis    Depression    History of kidney stones    Hypertension     Past Surgical History:  Procedure  Laterality Date   APPENDECTOMY     CHOLECYSTECTOMY     ELBOW SURGERY     RIGHT   JOINT REPLACEMENT     right TKA 06/2010   KNEE ARTHROPLASTY  06/17/2011   Procedure: COMPUTER ASSISTED TOTAL KNEE ARTHROPLASTY;  Surgeon: Alta Corning, MD;  Location: [facility];  Service: Orthopedics;  Laterality: Left;  TOTAL KNEE REPLACEMENT WITH GENERAL ANESTHESIA AND PRE OP FEMORAL NERVE BLOCK   PERIANAL CYST      Social History:  reports that she has been smoking cigarettes. She does not have any smokeless tobacco history on file. She reports current alcohol use. She reports that she does not use drugs.  Allergies:  Allergies  Allergen Reactions   Sulfa Antibiotics Anaphylaxis   Prednisone Other (See Comments)    hallucinations   Nsaids Other (See Comments)    Stomach pain, diarrhea   Other Other (See Comments)    Steroids: hallucinations    (Not in a hospital admission)   Prior to Admission medications   Medication Sig Start Date End Date Taking? Authorizing Provider  ALPRAZolam Duanne Moron) 1 MG tablet Take 0.5 mg by mouth See admin instructions. Take 1/2 tablet (0.5 mg) by mouth every morning and at bedtime (with trazodone); may also take 1/2 tablet (0.5 mg) twice daily as needed for anxiety 01/25/21  Yes [provider]  budesonide-formoterol (SYMBICORT) 160-4.5 MCG/ACT inhaler Inhale 2 puffs into the lungs 2 (two) times daily as needed (shortness of breath/wheezing). 01/20/21  Yes [provider]  cephALEXin (KEFLEX) 500 MG capsule Take 500 mg by mouth every 6 (six) hours. 01/25/21  Yes [provider]  levETIRAcetam (KEPPRA) 500 MG tablet Take 500 mg by mouth 2 (two) times daily. 01/20/21  Yes [provider]  metoprolol succinate (TOPROL-XL) 50 MG 24 hr tablet Take 50 mg by mouth every morning. 01/20/21  Yes [provider]  primidone (MYSOLINE) 50 MG tablet Take 100 mg by mouth every morning. 01/21/21  Yes [provider]  promethazine (PHENERGAN)  12.5 MG tablet Take 12.5 mg by mouth 2 (two) times daily as needed for nausea or vomiting. 01/20/21  Yes [provider]  spironolactone (ALDACTONE) 50 MG tablet Take 50 mg by mouth 2 (two) times daily. 01/25/21  Yes [provider]  traZODone (DESYREL) 100 MG tablet Take 100 mg by mouth at bedtime. Take with 0.5 mg alprazolam 01/20/21  Yes [provider]  furosemide (LASIX) 20 MG tablet Take 20 mg by mouth daily. Patient not taking: Reported on 02/02/2021 02/01/21   [provider]    Blood pressure (!) 101/58, pulse 78, temperature 98 F (36.7 C), resp. rate 14, height 5\' 1"  (1.549 m), weight 90.7 kg, SpO2 (!) 86 %. Physical Exam: General: pleasant female who is laying in bed in NAD HEENT: head is normocephalic, atraumatic.  Sclera are noninjected.  Pupils equal and round.  Ears and nose without any masses or lesions.  Mouth is pink and moist. Dentition fair Heart: regular, rate, and rhythm.  Normal s1,s2. No obvious murmurs, gallops, or rubs noted.  Palpable pedal pulses bilaterally  Lungs: CTAB, no wheezes, rhonchi, or rales noted.  Respiratory effort nonlabored Abd: well healed open RUQ incision, obese, soft, NT, +BS, no masses, hernias, or organomegaly. Faint cellulitis extends bilaterally from umbilicus, no expressible fluid in or around umbilicus, no induration or fluctuance MS: 1+ edema BLE Skin: warm and dry with no masses, lesions, or rashes Psych: A&Ox3 Neuro: MAEs  Results for orders placed or performed during the hospital encounter of 02/01/21 (from the past 48 hour(s))  Lipase, blood     Status: None   Collection Time: 02/01/21  4:56 PM  Result Value Ref Range   Lipase 28 11 - 51 U/L    Comment: Performed at [facility], [place] [address] [place], [place] [address]  Comprehensive metabolic panel     Status: Abnormal   Collection Time: 02/01/21  4:56 PM  Result Value Ref Range   Sodium 135 135 - 145 mmol/L   Potassium 3.4 (L) 3.5 - 5.1  mmol/L    Comment: SLIGHT HEMOLYSIS   Chloride 93 (L) 98 - 111 mmol/L   CO2 34 (H) 22 - 32 mmol/L   Glucose, Bld 110 (H) 70 - 99 mg/dL    Comment: Glucose reference range applies only to samples taken after fasting for at least 8 hours.   BUN 11 8 - 23 mg/dL   Creatinine, Ser 1.27 (H) 0.44 - 1.00 mg/dL   Calcium 8.5 (L) 8.9 - 10.3 mg/dL   Total Protein 6.6 6.5 - 8.1 g/dL   Albumin 2.2 (L) 3.5 - 5.0 g/dL   AST 38 15 - 41 U/L   ALT 22 0 - 44 U/L   Alkaline Phosphatase 123 38 - 126 U/L   Total Bilirubin 1.6 (H) 0.3 - 1.2 mg/dL   GFR, Estimated 47 (L) >60 mL/min    Comment: (NOTE) Calculated using the CKD-EPI Creatinine Equation (2021)    Anion gap 8 5 - 15    Comment: Performed at [facility], [place] [address] [place], [place]  [address]  CBC     Status: Abnormal   Collection Time: 02/01/21  4:56 PM  Result Value Ref Range   WBC 7.6 4.0 - 10.5 K/uL   RBC 4.03 3.87 - 5.11 MIL/uL   Hemoglobin 13.4 12.0 - 15.0 g/dL   HCT 40.2 36.0 - 46.0 %   MCV 99.8 80.0 - 100.0 fL   MCH 33.3 26.0 - 34.0 pg   MCHC 33.3 30.0 - 36.0 g/dL   RDW 15.6 (H) 11.5 - 15.5 %   Platelets 123 (L) 150 - 400 K/uL    Comment: REPEATED TO VERIFY   nRBC 0.0 0.0 - 0.2 %    Comment: Performed at [facility], [place] [address] [place], [place] [address]  Urinalysis, Routine w reflex microscopic     Status: Abnormal   Collection Time: 02/01/21 11:37 PM  Result Value Ref Range   Color, Urine YELLOW YELLOW   APPearance CLOUDY (A) CLEAR   Specific Gravity, Urine <1.005 (L) 1.005 - 1.030   pH 6.5 5.0 - 8.0   Glucose, UA NEGATIVE NEGATIVE mg/dL   Hgb urine dipstick TRACE (A) NEGATIVE   Bilirubin Urine NEGATIVE NEGATIVE   Ketones, ur NEGATIVE NEGATIVE mg/dL   Protein, ur NEGATIVE NEGATIVE mg/dL   Nitrite NEGATIVE NEGATIVE   Leukocytes,Ua TRACE (A) NEGATIVE    Comment: Performed at [facility] [address], [place], [place] [address]  Urinalysis, Microscopic (reflex)     Status: Abnormal    Collection Time: 02/01/21 11:37 PM  Result Value Ref Range   RBC / HPF 6-10 0 - 5 RBC/hpf   WBC, UA 6-10 0 - 5 WBC/hpf   Bacteria, UA FEW (A) NONE SEEN   Squamous Epithelial / LPF 6-10 0 - 5   Mucus PRESENT     Comment: Performed at [facility], [place] [address] [place], [place] [address]  Lactic acid, plasma     Status: Abnormal   Collection Time: 02/02/21  3:57 AM  Result Value Ref Range   Lactic Acid, Venous 2.1 (HH) 0.5 - 1.9 mmol/L    Comment: CRITICAL RESULT CALLED TO, READ BACK BY AND VERIFIED WITH: Billie Lade 02/02/21 0436 WAYK Performed at [facility] [address], [place], [place] [address]   POC occult blood, ED     Status: Abnormal   Collection Time: 02/02/21  4:18 AM  Result Value Ref Range   Fecal Occult Bld POSITIVE (A) NEGATIVE  Resp Panel by RT-PCR (Flu A&B, Covid) Nasopharyngeal Swab     Status: None   Collection Time: 02/02/21  4:18 AM   Specimen: Nasopharyngeal Swab; Nasopharyngeal(NP) swabs in vial transport medium  Result Value Ref Range   SARS Coronavirus 2 by RT PCR NEGATIVE NEGATIVE    Comment: (NOTE) SARS-CoV-2 target nucleic acids are NOT DETECTED.  The SARS-CoV-2 RNA is generally detectable in upper respiratory specimens during the acute phase of infection. The lowest concentration of SARS-CoV-2 viral copies this assay can detect is 138 copies/mL. A negative result does not preclude SARS-Cov-2 infection and should not be used as the sole basis for treatment or other patient management decisions. A negative result may occur with  improper specimen collection/handling, submission of specimen other than nasopharyngeal swab, presence of viral mutation(s) within the areas targeted by this assay, and inadequate number of viral copies(<138 copies/mL). A negative result must be combined with clinical observations, patient history, and epidemiological information. The expected result is Negative.  Fact Sheet for  Patients:   EntrepreneurPulse.com.au  Fact Sheet for Healthcare Providers:  IncredibleEmployment.be  This test is no t yet approved or cleared by the [place] FDA and  has been authorized for detection and/or diagnosis of SARS-CoV-2 by FDA under an Emergency Use Authorization (EUA). This EUA will remain  in effect (meaning this test can be used) for the duration of the COVID-19 declaration under Section 564(b)(1) of the Act, 21 U.S.C.section 360bbb-3(b)(1), unless the authorization is terminated  or revoked sooner.       Influenza A by PCR NEGATIVE NEGATIVE   Influenza B by PCR NEGATIVE NEGATIVE    Comment: (NOTE) The Xpert Xpress SARS-CoV-2/FLU/RSV plus assay is intended as an aid in the diagnosis of influenza from Nasopharyngeal swab specimens and should not be used as a sole basis for treatment. Nasal washings and aspirates are unacceptable for Xpert Xpress SARS-CoV-2/FLU/RSV testing.  Fact Sheet for Patients: EntrepreneurPulse.com.au  Fact Sheet for Healthcare Providers: IncredibleEmployment.be  This test is not yet approved or cleared by the [place] FDA and has been authorized for detection and/or diagnosis of SARS-CoV-2 by FDA under an Emergency Use Authorization (EUA). This EUA will remain in effect (meaning this test can be used) for the duration of the COVID-19 declaration under Section 564(b)(1) of the Act, 21 U.S.C. section 360bbb-3(b)(1), unless the authorization is terminated or revoked.  Performed at [facility], [place] [address] [place], [place] [address]   Protime-INR     Status: Abnormal   Collection Time: 02/02/21  4:22 AM  Result Value Ref Range   Prothrombin Time 18.0 (H) 11.4 - 15.2 seconds   INR 1.5 (H) 0.8 - 1.2    Comment: (NOTE) INR goal varies based on device and disease states. Performed at [facility], [place] [address], [place], [place] [address]   Culture,  blood (single) w Reflex to ID Panel     Status: None (Preliminary result)   Collection Time: 02/02/21  4:50 AM   Specimen: BLOOD  Result Value Ref Range   Specimen Description BLOOD RIGHT HAND    Special Requests      BOTTLES DRAWN AEROBIC AND ANAEROBIC Blood Culture results may not be optimal due to an inadequate volume of blood received in culture bottles   Culture      NO GROWTH < 12 HOURS Performed at [facility] [address], [place], [place] [address]    Report Status PENDING   Ammonia     Status: None   Collection Time: 02/02/21 10:38 AM  Result Value Ref Range   Ammonia 33 9 - 35 umol/L    Comment: SLIGHT HEMOLYSIS Performed at [facility], [place] [address], [place], [place] [address]   Comprehensive metabolic panel     Status: Abnormal   Collection Time: 02/02/21 10:38 AM  Result Value Ref Range   Sodium 137 135 - 145 mmol/L   Potassium 3.5 3.5 - 5.1 mmol/L    Comment: SLIGHT HEMOLYSIS   Chloride 98 98 - 111 mmol/L   CO2 28 22 - 32 mmol/L   Glucose, Bld 93 70 - 99 mg/dL    Comment: Glucose reference range applies only to samples taken after fasting for at least 8 hours.   BUN 12 8 - 23 mg/dL   Creatinine, Ser 1.18 (H) 0.44 - 1.00 mg/dL   Calcium 7.7 (L) 8.9 - 10.3 mg/dL   Total Protein 5.4 (L) 6.5 - 8.1 g/dL   Albumin 2.2 (L)  3.5 - 5.0 g/dL   AST 37 15 - 41 U/L   ALT 19 0 - 44 U/L   Alkaline Phosphatase 92 38 - 126 U/L   Total Bilirubin 1.3 (H) 0.3 - 1.2 mg/dL   GFR, Estimated 51 (L) >60 mL/min    Comment: (NOTE) Calculated using the CKD-EPI Creatinine Equation (2021)    Anion gap 11 5 - 15    Comment: Performed at [facility] [address] [place], [place] [address]  Magnesium     Status: None   Collection Time: 02/02/21 10:38 AM  Result Value Ref Range   Magnesium 1.8 1.7 - 2.4 mg/dL    Comment: Performed at [facility] [address] [place], [place] [address]  Lactic acid, plasma     Status: None   Collection Time:  02/02/21 11:10 AM  Result Value Ref Range   Lactic Acid, Venous 1.9 0.5 - 1.9 mmol/L    Comment: Performed at [facility] [address], [place], [place] [address]  Type and screen [place]     Status: None   Collection Time: 02/02/21 11:10 AM  Result Value Ref Range   ABO/RH(D) O POS    Antibody Screen NEG    Sample Expiration      02/05/2021,2359 Performed at [facility], [place] [address] [place], [place] [address]    CT ABDOMEN PELVIS W CONTRAST  Result Date: 02/01/2021 CLINICAL DATA:  Acute abdominal pain. Wound drainage below belly button. EXAM: CT ABDOMEN AND PELVIS WITH CONTRAST TECHNIQUE: Multidetector CT imaging of the abdomen and pelvis was performed using the standard protocol following bolus administration of intravenous contrast. RADIATION DOSE REDUCTION: This exam was performed according to the departmental dose-optimization program which includes automated exposure control, adjustment of the mA and/or kV according to patient size and/or use of iterative reconstruction technique. CONTRAST:  146mL OMNIPAQUE IOHEXOL 300 MG/ML  SOLN COMPARISON:  CT abdomen and pelvis 01/08/2021. FINDINGS: Lower chest: No acute abnormality. Hepatobiliary: Patient is status post cholecystectomy. There is mild intra and extrahepatic biliary ductal dilatation which appears similar to the prior study. No focal liver lesions are identified. Pancreas: Unremarkable. No pancreatic ductal dilatation or surrounding inflammatory changes. Spleen: Mildly enlarged, unchanged. Adrenals/Urinary Tract: There are rounded hypodensities in both kidneys which are too small to characterize, likely cyst. Otherwise, the kidneys, adrenal glands and bladder are within normal limits. Stomach/Bowel: Stomach is within normal limits. Appendix appears normal. No evidence of bowel wall thickening, distention, or inflammatory changes. The appendix is not visualized. Vascular/Lymphatic: Aortic  atherosclerosis. No enlarged abdominal or pelvic lymph nodes. Prominent pericardial lymph node measures 7 mm. Reproductive: Uterus and bilateral adnexa are unremarkable. Other: There is a small amount of free fluid in the pelvis. Ascites has significantly decreased. Minimal fluid tracking along the bilateral pericolic gutters. Central mesenteric haziness. No focal body wall edema. There is anterior body wall subcutaneous stranding and skin thickening. There is a small enhancing fluid collection in the subcutaneous tissues the level of the inferior umbilicus measuring 3.1 by 0.9 x 2.2 cm. No evidence for soft tissue gas. Musculoskeletal: Multilevel degenerative changes affect the spine. IMPRESSION: 1. Small enhancing fluid collections in the subcutaneous tissues at the level of the umbilicus measuring 3.1 x 0.9 x 2.2 cm. Abscess not excluded. No evidence for soft tissue gas. 2. Anterior abdominal wall subcutaneous stranding and skin thickening may be related to cellulitis. 3. Significant decrease in ascites.  Mild ascites  persists. 4. Stable splenomegaly. 5.  Aortic Atherosclerosis (ICD10-I70.0). Electronically Signed   By: Ronney Asters M.D.   On: 02/01/2021 20:34    Anti-infectives (From admission, onward)    Start     Dose/Rate Route Frequency Ordered Stop   02/03/21 0800  vancomycin (VANCOCIN) IVPB 1000 mg/200 mL premix        1,000 mg 200 mL/hr over 60 Minutes Intravenous Every 24 hours 02/02/21 0713     02/02/21 0530  vancomycin (VANCOREADY) IVPB 1500 mg/300 mL        1,500 mg 150 mL/hr over 120 Minutes Intravenous  Once 02/02/21 0528 02/02/21 5615        Assessment/Plan Subcutaneous periumbilical abscess with cellulitis - ?infected urachal cyst - CT scan shows a small fluid collection (3.1 x 0.9 x 2.2cm) deep to the umbilicus with cellulitis. This is not visible on exam, she has no induration, fluctuance, or expressible drainage. Would not recommend I&D at this time. Recommend broad spectrum  antibiotics. If patient does not improve we can consider I&D.  ID - vancomycin VTE - SCDs FEN - NPO Foley - none  Hypotension Hypoalbuminemia Coagulopathy Thrombocytopenia Ascites FOBT+ HTN HLD CKD Hypothyroidism Seizures Anxiety/ depression Obesity BMI 37.79  Moderate Medical Decision Making  Wellington Hampshire, PA-C [facility] 02/02/2021, 1:45 PM Please see Amion for pager number during day hours 7:00am-4:30pm

## 2021-02-02 NOTE — H&P (Addendum)
History and Physical    Carla Leonard GUR:[id number] DOB: 16-Aug-1955 DOA: 02/01/2021  Referring MD/NP/PA: Inda Merlin, MD PCP: Bonnita Nasuti, MD  Patient coming from: Home via EMS  Chief Complaint: Drainage from bellybutton  I have personally briefly reviewed patient's old medical records in [place]   HPI: Carla Leonard is a 66 y.o. female with medical history significant of hypertension, hypothyroidism, nephrolithiasis, thrombocytopenia, seizures, anxiety, depression who presents for drainage from her bellybutton.  Patient states that for the last 3 months she has been having abdominal swelling.  She had been placed on Lasix and spironolactone and had been taking the medications as advised.  However here over the last couple weeks she had noted drainage coming from her bellybutton.  She states drainage looks like urine.  Reports having associated symptoms of redness of her abdomen.  She had been evaluated at [facility] ED on 1/22 and 1/23.  It appears she had been given a prescription for cephalexin on 1/23 and recommended to follow-up with GI in outpatient setting.  She only reports taking the medication 1 -2 days although was documented to have been picked up on 1/23.  Patient denies having any blood in her stools knowledge.  She called EMS after standing up and reporting large amount of fluid coming from her bellybutton yesterday afternoon.  Denies having any significant fever, chills, chest pain, or shortness of breath at this time.   ED Course: Upon admission into the emergency department patient was seen to be afebrile with blood pressures as low as 40/27, and all other vital signs maintained.  Labs from yesterday significant for platelets 123, potassium 3.4, CO2 34, BUN 11, creatinine 1.27, albumin 2.2, total bilirubin 1.6, and lactic acid 2.1.  CT scan of the abdomen and pelvis noted small enhancing fluid collections at the soft tissues near the umbilicus measuring 3.1 x  0.9 x 2.2 cm.  Stool guaiacs were noted to be positive.  Patient had been given vancomycin and normal saline 500 mL bolus.  She was noted to be lethargic and and reported to have taken her Ativan  Review of Systems  Constitutional:  Negative for fever.  Cardiovascular:  Positive for leg swelling. Negative for chest pain.  Gastrointestinal:  Negative for blood in stool, nausea and vomiting.       Positive for abdominal swelling  Neurological:  Negative for seizures.  Psychiatric/Behavioral:  Negative for substance abuse.   Review of systems otherwise noted to be negative except for as noted above in HPI  Past Medical History:  Diagnosis Date   Arthritis    Depression    History of kidney stones    Hypertension     Past Surgical History:  Procedure Laterality Date   APPENDECTOMY     CHOLECYSTECTOMY     ELBOW SURGERY     RIGHT   JOINT REPLACEMENT     right TKA 06/2010   KNEE ARTHROPLASTY  06/17/2011   Procedure: COMPUTER ASSISTED TOTAL KNEE ARTHROPLASTY;  Surgeon: Alta Corning, MD;  Location: [place];  Service: Orthopedics;  Laterality: Left;  TOTAL KNEE REPLACEMENT WITH GENERAL ANESTHESIA AND PRE OP FEMORAL NERVE BLOCK   PERIANAL CYST       reports that she has been smoking. She does not have any smokeless tobacco history on file. She reports current alcohol use. She reports that she does not use drugs.  Allergies  Allergen Reactions   Sulfa Antibiotics Anaphylaxis   Nsaids Other (See Comments)  Stomach pain, diarrhea   Other Other (See Comments)    Steroids: hallucinations    No family history on file.  Prior to Admission medications   Medication Sig Start Date End Date Taking? Authorizing Provider  ALPRAZolam Duanne Moron) 0.5 MG tablet Take 0.5 mg by mouth 2 (two) times daily as needed for anxiety.    [provider]  metolazone (ZAROXOLYN) 2.5 MG tablet Take 2.5 mg by mouth daily.    [provider]  metoprolol succinate (TOPROL-XL) 25 MG 24 hr tablet  Take 25 mg by mouth daily.    [provider]  oxycodone (ROXICODONE) 30 MG immediate release tablet Take 30 mg by mouth every 4 (four) hours as needed. For pain    [provider]  Oxycodone HCl 20 MG TABS Take 1 tablet (20 mg total) by mouth 3 (three) times daily. 06/17/15   Geradine Girt, DO  Potassium Gluconate 550 MG TABS Take 550 mg by mouth 2 (two) times daily as needed (leg cramps).    [provider]  promethazine (PHENERGAN) 25 MG tablet Take 25 mg by mouth every 6 (six) hours as needed for nausea or vomiting.    [provider]  traZODone (DESYREL) 50 MG tablet Take 25-50 mg by mouth at bedtime.    [provider]    Physical Exam:  Constitutional: Elderly female who appears to be in no acute distress and able to follow commands at this time Vitals:   02/02/21 0330 02/02/21 0345 02/02/21 0445 02/02/21 0527  BP: (!) 85/40 (!) 91/37 (!) 91/47   Pulse: 65 72 61   Resp: 12 18 15    Temp:      SpO2: 98% 96% 97%   Weight:    90.7 kg  Height:    5\' 1"  (1.549 m)   Eyes: PERRL, lids and conjunctivae normal ENMT: Mucous membranes are moist. Posterior pharynx clear of any exudate or lesions.  Neck: normal, supple, no masses, no thyromegaly Respiratory: clear to auscultation bilaterally, no wheezing, no crackles. Normal respiratory effort. No accessory muscle use.  Cardiovascular: Regular rate and rhythm, no murmurs / rubs / gallops.  2+ pitting edema lower extremities 2+ pedal pulses.  Abdomen: Mild erythema noted around the umbilicus with some clear drainage present at the umbilicus. Musculoskeletal: no clubbing / cyanosis. No joint deformity upper and lower extremities. Good ROM, no contractures. Normal muscle tone.  Skin: Erythema appreciated of the abdomen surrounding the umbilicus. Neurologic: CN 2-12 grossly intact.  Able to move all extremities Psychiatric: Mild memory impairment. Alert and oriented x 3. Normal mood.     Labs on  Admission: I have personally reviewed following labs and imaging studies  CBC: Recent Labs  Lab 02/01/21 1656  WBC 7.6  HGB 13.4  HCT 40.2  MCV 99.8  PLT 026*   Basic Metabolic Panel: Recent Labs  Lab 02/01/21 1656  NA 135  K 3.4*  CL 93*  CO2 34*  GLUCOSE 110*  BUN 11  CREATININE 1.27*  CALCIUM 8.5*   GFR: Estimated Creatinine Clearance: 45.3 mL/min (A) (by C-G formula based on SCr of 1.27 mg/dL (H)). Liver Function Tests: Recent Labs  Lab 02/01/21 1656  AST 38  ALT 22  ALKPHOS 123  BILITOT 1.6*  PROT 6.6  ALBUMIN 2.2*   Recent Labs  Lab 02/01/21 1656  LIPASE 28   No results for input(s): AMMONIA in the last 168 hours. Coagulation Profile: Recent Labs  Lab 02/02/21 0422  INR 1.5*  Cardiac Enzymes: No results for input(s): CKTOTAL, CKMB, CKMBINDEX, TROPONINI in the last 168 hours. BNP (last 3 results) No results for input(s): PROBNP in the last 8760 hours. HbA1C: No results for input(s): HGBA1C in the last 72 hours. CBG: No results for input(s): GLUCAP in the last 168 hours. Lipid Profile: No results for input(s): CHOL, HDL, LDLCALC, TRIG, CHOLHDL, LDLDIRECT in the last 72 hours. Thyroid Function Tests: No results for input(s): TSH, T4TOTAL, FREET4, T3FREE, THYROIDAB in the last 72 hours. Anemia Panel: No results for input(s): VITAMINB12, FOLATE, FERRITIN, TIBC, IRON, RETICCTPCT in the last 72 hours. Urine analysis:    Component Value Date/Time   COLORURINE YELLOW 02/01/2021 2337   APPEARANCEUR CLOUDY (A) 02/01/2021 2337   LABSPEC <1.005 (L) 02/01/2021 2337   PHURINE 6.5 02/01/2021 2337   GLUCOSEU NEGATIVE 02/01/2021 2337   HGBUR TRACE (A) 02/01/2021 2337   BILIRUBINUR NEGATIVE 02/01/2021 [place] 02/01/2021 2337   PROTEINUR NEGATIVE 02/01/2021 2337   UROBILINOGEN 0.2 06/17/2011 0636   NITRITE NEGATIVE 02/01/2021 2337   LEUKOCYTESUR TRACE (A) 02/01/2021 2337   Sepsis Labs: Recent Results (from the past 240 hour(s))   Resp Panel by RT-PCR (Flu A&B, Covid) Nasopharyngeal Swab     Status: None   Collection Time: 02/02/21  4:18 AM   Specimen: Nasopharyngeal Swab; Nasopharyngeal(NP) swabs in vial transport medium  Result Value Ref Range Status   SARS Coronavirus 2 by RT PCR NEGATIVE NEGATIVE Final    Comment: (NOTE) SARS-CoV-2 target nucleic acids are NOT DETECTED.  The SARS-CoV-2 RNA is generally detectable in upper respiratory specimens during the acute phase of infection. The lowest concentration of SARS-CoV-2 viral copies this assay can detect is 138 copies/mL. A negative result does not preclude SARS-Cov-2 infection and should not be used as the sole basis for treatment or other patient management decisions. A negative result may occur with  improper specimen collection/handling, submission of specimen other than nasopharyngeal swab, presence of viral mutation(s) within the areas targeted by this assay, and inadequate number of viral copies(<138 copies/mL). A negative result must be combined with clinical observations, patient history, and epidemiological information. The expected result is Negative.  Fact Sheet for Patients:  EntrepreneurPulse.com.au  Fact Sheet for Healthcare Providers:  IncredibleEmployment.be  This test is no t yet approved or cleared by the [place] FDA and  has been authorized for detection and/or diagnosis of SARS-CoV-2 by FDA under an Emergency Use Authorization (EUA). This EUA will remain  in effect (meaning this test can be used) for the duration of the COVID-19 declaration under Section 564(b)(1) of the Act, 21 U.S.C.section 360bbb-3(b)(1), unless the authorization is terminated  or revoked sooner.       Influenza A by PCR NEGATIVE NEGATIVE Final   Influenza B by PCR NEGATIVE NEGATIVE Final    Comment: (NOTE) The Xpert Xpress SARS-CoV-2/FLU/RSV plus assay is intended as an aid in the diagnosis of influenza from  Nasopharyngeal swab specimens and should not be used as a sole basis for treatment. Nasal washings and aspirates are unacceptable for Xpert Xpress SARS-CoV-2/FLU/RSV testing.  Fact Sheet for Patients: EntrepreneurPulse.com.au  Fact Sheet for Healthcare Providers: IncredibleEmployment.be  This test is not yet approved or cleared by the [place] FDA and has been authorized for detection and/or diagnosis of SARS-CoV-2 by FDA under an Emergency Use Authorization (EUA). This EUA will remain in effect (meaning this test can be used) for the duration of the COVID-19 declaration under Section 564(b)(1) of the Act, 21 U.S.C.  section 360bbb-3(b)(1), unless the authorization is terminated or revoked.  Performed at [facility],  [address], [place], [place] [address]      Radiological Exams on Admission: CT ABDOMEN PELVIS W CONTRAST  Result Date: 02/01/2021 CLINICAL DATA:  Acute abdominal pain. Wound drainage below belly button. EXAM: CT ABDOMEN AND PELVIS WITH CONTRAST TECHNIQUE: Multidetector CT imaging of the abdomen and pelvis was performed using the standard protocol following bolus administration of intravenous contrast. RADIATION DOSE REDUCTION: This exam was performed according to the departmental dose-optimization program which includes automated exposure control, adjustment of the mA and/or kV according to patient size and/or use of iterative reconstruction technique. CONTRAST:  188mL OMNIPAQUE IOHEXOL 300 MG/ML  SOLN COMPARISON:  CT abdomen and pelvis 01/08/2021. FINDINGS: Lower chest: No acute abnormality. Hepatobiliary: Patient is status post cholecystectomy. There is mild intra and extrahepatic biliary ductal dilatation which appears similar to the prior study. No focal liver lesions are identified. Pancreas: Unremarkable. No pancreatic ductal dilatation or surrounding inflammatory changes. Spleen: Mildly enlarged, unchanged.  Adrenals/Urinary Tract: There are rounded hypodensities in both kidneys which are too small to characterize, likely cyst. Otherwise, the kidneys, adrenal glands and bladder are within normal limits. Stomach/Bowel: Stomach is within normal limits. Appendix appears normal. No evidence of bowel wall thickening, distention, or inflammatory changes. The appendix is not visualized. Vascular/Lymphatic: Aortic atherosclerosis. No enlarged abdominal or pelvic lymph nodes. Prominent pericardial lymph node measures 7 mm. Reproductive: Uterus and bilateral adnexa are unremarkable. Other: There is a small amount of free fluid in the pelvis. Ascites has significantly decreased. Minimal fluid tracking along the bilateral pericolic gutters. Central mesenteric haziness. No focal body wall edema. There is anterior body wall subcutaneous stranding and skin thickening. There is a small enhancing fluid collection in the subcutaneous tissues the level of the inferior umbilicus measuring 3.1 by 0.9 x 2.2 cm. No evidence for soft tissue gas. Musculoskeletal: Multilevel degenerative changes affect the spine. IMPRESSION: 1. Small enhancing fluid collections in the subcutaneous tissues at the level of the umbilicus measuring 3.1 x 0.9 x 2.2 cm. Abscess not excluded. No evidence for soft tissue gas. 2. Anterior abdominal wall subcutaneous stranding and skin thickening may be related to cellulitis. 3. Significant decrease in ascites.  Mild ascites persists. 4. Stable splenomegaly. 5.  Aortic Atherosclerosis (ICD10-I70.0). Electronically Signed   By: Ronney Asters M.D.   On: 02/01/2021 20:34    EKG: Independently reviewed.  Sinus bradycardia 56 bpm Assessment/Plan Cellulitis with abscess: Patient presents with complaints of drainage from wound of her abdomen.  CT scan of the abdomen and notes anterior wall stranding with 3.1 x 0.9 x 2.2 cm fluid collection present.  Patient was started on empiric antibiotics of vancomycin -Admit to a  progressive bed -Follow-up blood cultures -Continue empiric antibiotics vancomycin  Hypotension: Acute.  Blood pressures noted to be initially as low as 40/27.  Home medication regimen appears to include metoprolol, spironolactone 50 mg twice daily, and furosemide 20 mg daily.  Patient had received 500 mL bolus while in the ED. -Hold home blood pressure medications -Bolus additional 250 mL of fluid and started on rate at 100 mL/h.   -Give 1 g of albumin -Goal MAP at least 65.  Will adjust fluids as needed  Positive guaiac stools question possible GI: Acute.  Patient was noted to have positive stool guaiacs, but she had not seen any blood in her stools to her knowledge.  Hemoglobin was initially noted to be within normal limits at  13.4 yesterday. -Type and screen for possible need of blood products -Continue to monitor H&H -GI consulted we will follow-up for any further recommendation  Lactic acidosis: Acute.  Initial lactic acid 2.1.  Suspect secondary to above. -Trend lactic acid levels  Ascites: Mild ascites noted on CT scan with no clear report of cirrhosis.  Question possibility of underlying liver disease as a cause of symptoms as she has prior history of alcohol abuse.  She was recommended to follow-up with GI, but had not been able to do so yet -Appreciate GI consultative services,  will follow-up for any further recommendation  Hypokalemia: Acute.  On admission potassium 3.4, but was not initially replaced. -Give potassium chloride 40 mEq p.o. -Continue to monitor place as needed  Thrombocytopenia: Chronic.  Platelet count 123 which appears near patient's baseline. -Continue to monitor  Coagulopathy: INR initially noted to be 1.5.  No signs of bleeding present at this time.  Possibly related to liver disease and prior reports of alcohol use. -Continue to monitor  Chronic kidney disease stage IIIa: Creatinine 1.27 on admission which appears relatively around patient's previous  baseline. -Continue to monitor kidney function  Hyperbilirubinemia: Acute.  Total bilirubin 1.6 on admission.  Patient status post cholecystectomy.  Anxiety and depression: Home medication regimen includes Xanax. -Xanax as neede  Hypoalbuminemia: Chronic.  Albumin noted to be 2.2 on admission. -Check prealbumin -Ensure shakes in between meals  Seizure disorder: No reported seizures. -Continue Keppra  History of alcohol abuse: Patient reports she has not drinking alcohol in over 2 years. -Encouraged continued abstinence from alcohol use   DVT prophylaxis: SCDs Code Status: Full Family Communication: Daughter to be updated Disposition Plan: To be determined Consults called: GI Admission status: Inpatient, require more than 2 midnight stay  Norval Morton MD [facility]   If 7PM-7AM, please contact night-coverage   02/02/2021, 6:46 AM

## 2021-02-02 NOTE — ED Provider Notes (Addendum)
[facility] EMERGENCY DEPARTMENT Provider Note   CSN: [id number] Arrival date & time: 02/01/21  1611     History  Chief Complaint  Patient presents with   Abdominal Pain    Carla Leonard is a 66 y.o. female.  Patient presents to the emergency department for evaluation of drainage from around her bellybutton.  Patient reports that he has been ongoing for some time.  She was seen at [facility] for this previously.  Patient reports that the drainage increased today.  She reports that she stood up and had some pus come out from around the bellybutton area.      Home Medications Prior to Admission medications   Medication Sig Start Date End Date Taking? Authorizing Provider  ALPRAZolam Duanne Moron) 0.5 MG tablet Take 0.5 mg by mouth 2 (two) times daily as needed for anxiety.    [provider]  doxycycline (VIBRA-TABS) 100 MG tablet Take 1 tablet (100 mg total) by mouth every 12 (twelve) hours. 06/18/15   Geradine Girt, DO  metolazone (ZAROXOLYN) 2.5 MG tablet Take 2.5 mg by mouth daily.    [provider]  metoprolol succinate (TOPROL-XL) 25 MG 24 hr tablet Take 25 mg by mouth daily.    [provider]  oxycodone (ROXICODONE) 30 MG immediate release tablet Take 30 mg by mouth every 4 (four) hours as needed. For pain    [provider]  Oxycodone HCl 20 MG TABS Take 1 tablet (20 mg total) by mouth 3 (three) times daily. 06/17/15   Geradine Girt, DO  Potassium Gluconate 550 MG TABS Take 550 mg by mouth 2 (two) times daily as needed (leg cramps).    [provider]  promethazine (PHENERGAN) 25 MG tablet Take 25 mg by mouth every 6 (six) hours as needed for nausea or vomiting.    [provider]  traZODone (DESYREL) 50 MG tablet Take 25-50 mg by mouth at bedtime.    [provider]      Allergies    Sulfa antibiotics, Nsaids, and Other    Review of Systems   Review of Systems  Skin:  Positive for  wound.   Physical Exam Updated Vital Signs BP 101/84    Pulse 63    Temp 98 F (36.7 C)    Resp 10    SpO2 99%  Physical Exam Vitals and nursing note reviewed.  Constitutional:      General: She is not in acute distress.    Appearance: She is well-developed.  HENT:     Head: Normocephalic and atraumatic.  Eyes:     Conjunctiva/sclera: Conjunctivae normal.  Cardiovascular:     Rate and Rhythm: Normal rate and regular rhythm.     Heart sounds: No murmur heard. Pulmonary:     Effort: Pulmonary effort is normal. No respiratory distress.     Breath sounds: Normal breath sounds.  Abdominal:     Palpations: Abdomen is soft.     Tenderness: There is no abdominal tenderness.  Musculoskeletal:        General: No swelling.     Cervical back: Neck supple.  Skin:    General: Skin is warm and dry.     Capillary Refill: Capillary refill takes less than 2 seconds.  Neurological:     Mental Status: She is alert.  Psychiatric:        Mood and Affect: Mood normal.    ED Results / Procedures / Treatments   Labs (  all labs ordered are listed, but only abnormal results are displayed) Labs Reviewed  COMPREHENSIVE METABOLIC PANEL - Abnormal; Notable for the following components:      Result Value   Potassium 3.4 (*)    Chloride 93 (*)    CO2 34 (*)    Glucose, Bld 110 (*)    Creatinine, Ser 1.27 (*)    Calcium 8.5 (*)    Albumin 2.2 (*)    Total Bilirubin 1.6 (*)    GFR, Estimated 47 (*)    All other components within normal limits  CBC - Abnormal; Notable for the following components:   RDW 15.6 (*)    Platelets 123 (*)    All other components within normal limits  URINALYSIS, ROUTINE W REFLEX MICROSCOPIC - Abnormal; Notable for the following components:   APPearance CLOUDY (*)    Specific Gravity, Urine <1.005 (*)    Hgb urine dipstick TRACE (*)    Leukocytes,Ua TRACE (*)    All other components within normal limits  URINALYSIS, MICROSCOPIC (REFLEX) - Abnormal; Notable for the  following components:   Bacteria, UA FEW (*)    All other components within normal limits  LIPASE, BLOOD    EKG None  Radiology CT ABDOMEN PELVIS W CONTRAST  Result Date: 02/01/2021 CLINICAL DATA:  Acute abdominal pain. Wound drainage below belly button. EXAM: CT ABDOMEN AND PELVIS WITH CONTRAST TECHNIQUE: Multidetector CT imaging of the abdomen and pelvis was performed using the standard protocol following bolus administration of intravenous contrast. RADIATION DOSE REDUCTION: This exam was performed according to the departmental dose-optimization program which includes automated exposure control, adjustment of the mA and/or kV according to patient size and/or use of iterative reconstruction technique. CONTRAST:  140mL OMNIPAQUE IOHEXOL 300 MG/ML  SOLN COMPARISON:  CT abdomen and pelvis 01/08/2021. FINDINGS: Lower chest: No acute abnormality. Hepatobiliary: Patient is status post cholecystectomy. There is mild intra and extrahepatic biliary ductal dilatation which appears similar to the prior study. No focal liver lesions are identified. Pancreas: Unremarkable. No pancreatic ductal dilatation or surrounding inflammatory changes. Spleen: Mildly enlarged, unchanged. Adrenals/Urinary Tract: There are rounded hypodensities in both kidneys which are too small to characterize, likely cyst. Otherwise, the kidneys, adrenal glands and bladder are within normal limits. Stomach/Bowel: Stomach is within normal limits. Appendix appears normal. No evidence of bowel wall thickening, distention, or inflammatory changes. The appendix is not visualized. Vascular/Lymphatic: Aortic atherosclerosis. No enlarged abdominal or pelvic lymph nodes. Prominent pericardial lymph node measures 7 mm. Reproductive: Uterus and bilateral adnexa are unremarkable. Other: There is a small amount of free fluid in the pelvis. Ascites has significantly decreased. Minimal fluid tracking along the bilateral pericolic gutters. Central mesenteric  haziness. No focal body wall edema. There is anterior body wall subcutaneous stranding and skin thickening. There is a small enhancing fluid collection in the subcutaneous tissues the level of the inferior umbilicus measuring 3.1 by 0.9 x 2.2 cm. No evidence for soft tissue gas. Musculoskeletal: Multilevel degenerative changes affect the spine. IMPRESSION: 1. Small enhancing fluid collections in the subcutaneous tissues at the level of the umbilicus measuring 3.1 x 0.9 x 2.2 cm. Abscess not excluded. No evidence for soft tissue gas. 2. Anterior abdominal wall subcutaneous stranding and skin thickening may be related to cellulitis. 3. Significant decrease in ascites.  Mild ascites persists. 4. Stable splenomegaly. 5.  Aortic Atherosclerosis (ICD10-I70.0). Electronically Signed   By: Ronney Asters M.D.   On: 02/01/2021 20:34    Procedures Procedures    Medications  Ordered in ED Medications  iohexol (OMNIPAQUE) 300 MG/ML solution 100 mL (100 mLs Intravenous Contrast Given 02/01/21 2026)    ED Course/ Medical Decision Making/ A&P                           Medical Decision Making  Patient with previous history of hypertension, renal insufficiency presents to the emergency department with drainage from her abdomen.  Differential diagnosis would be cellulitis, skin abscess, enterocutaneous fistula.  Patient's lab work was reassuring.  Blood pressures have been on the low side, baseline is unclear.  She reports a history of essential hypertension. The only antihypertensive medication she is on is low dose metoprolol. She is on multiple narcotics and diuretics.  Patient not orthostatic, ambulating without difficulty.  Patient underwent CT scan to further evaluate.  She has a small fluid collection that could be cutaneous abscess near the umbilicus.  This is not palpable, no overlying induration, erythema or signs of cellulitis.  As it has spontaneously drained, I will not recommend incision and  drainage at this time as I cannot definitively locate it secondary to her body habitus.  We will initiate antibiotic coverage, follow-up with primary care.        Final Clinical Impression(s) / ED Diagnoses Final diagnoses:  Cutaneous abscess of abdominal wall    Rx / DC Orders ED Discharge Orders     None         Orpah Greek, MD 02/02/21 2060    Orpah Greek, MD 02/02/21 [phone]

## 2021-02-03 ENCOUNTER — Telehealth: Payer: Self-pay

## 2021-02-03 DIAGNOSIS — G40909 Epilepsy, unspecified, not intractable, without status epilepticus: Secondary | ICD-10-CM

## 2021-02-03 DIAGNOSIS — L03311 Cellulitis of abdominal wall: Principal | ICD-10-CM

## 2021-02-03 DIAGNOSIS — F1011 Alcohol abuse, in remission: Secondary | ICD-10-CM

## 2021-02-03 DIAGNOSIS — R195 Other fecal abnormalities: Secondary | ICD-10-CM | POA: Diagnosis present

## 2021-02-03 DIAGNOSIS — R188 Other ascites: Secondary | ICD-10-CM

## 2021-02-03 LAB — CBC
HCT: 33.9 % — ABNORMAL LOW (ref 36.0–46.0)
Hemoglobin: 11.2 g/dL — ABNORMAL LOW (ref 12.0–15.0)
MCH: 33.8 pg (ref 26.0–34.0)
MCHC: 33 g/dL (ref 30.0–36.0)
MCV: 102.4 fL — ABNORMAL HIGH (ref 80.0–100.0)
Platelets: 96 10*3/uL — ABNORMAL LOW (ref 150–400)
RBC: 3.31 MIL/uL — ABNORMAL LOW (ref 3.87–5.11)
RDW: 15.6 % — ABNORMAL HIGH (ref 11.5–15.5)
WBC: 4.9 10*3/uL (ref 4.0–10.5)
nRBC: 0 % (ref 0.0–0.2)

## 2021-02-03 LAB — COMPREHENSIVE METABOLIC PANEL
ALT: 17 U/L (ref 0–44)
AST: 29 U/L (ref 15–41)
Albumin: 2.1 g/dL — ABNORMAL LOW (ref 3.5–5.0)
Alkaline Phosphatase: 90 U/L (ref 38–126)
Anion gap: 10 (ref 5–15)
BUN: 12 mg/dL (ref 8–23)
CO2: 24 mmol/L (ref 22–32)
Calcium: 7.9 mg/dL — ABNORMAL LOW (ref 8.9–10.3)
Chloride: 102 mmol/L (ref 98–111)
Creatinine, Ser: 1.3 mg/dL — ABNORMAL HIGH (ref 0.44–1.00)
GFR, Estimated: 46 mL/min — ABNORMAL LOW (ref >60)
Glucose, Bld: 130 mg/dL — ABNORMAL HIGH (ref 70–99)
Potassium: 3.8 mmol/L (ref 3.5–5.1)
Sodium: 136 mmol/L (ref 135–145)
Total Bilirubin: 0.9 mg/dL (ref 0.3–1.2)
Total Protein: 5.2 g/dL — ABNORMAL LOW (ref 6.5–8.1)

## 2021-02-03 LAB — URINE CULTURE: Culture: <10000 — AB

## 2021-02-03 MED ORDER — VANCOMYCIN HCL 750 MG/150ML IV SOLN
750.0000 mg | INTRAVENOUS | Status: DC
  Administered 2021-02-03 – 2021-02-04 (×2): 750 mg via INTRAVENOUS
  Filled 2021-02-03 (×2): qty 150

## 2021-02-03 MED ORDER — PNEUMOCOCCAL 20-VAL CONJ VACC 0.5 ML IM SUSY
0.5000 mL | PREFILLED_SYRINGE | INTRAMUSCULAR | Status: DC
  Filled 2021-02-03 (×2): qty 0.5

## 2021-02-03 MED ORDER — PNEUMOCOCCAL VAC POLYVALENT 25 MCG/0.5ML IJ INJ: 0.5000 mL | INJECTION | INTRAMUSCULAR | Status: DC

## 2021-02-03 MED ORDER — TRAZODONE HCL 50 MG PO TABS
50.0000 mg | ORAL_TABLET | Freq: Every day | ORAL | Status: DC
  Administered 2021-02-03 – 2021-02-04 (×2): 50 mg via ORAL
  Filled 2021-02-03 (×2): qty 1

## 2021-02-03 NOTE — Progress Notes (Signed)
Patient ID: Carla Leonard, female   DOB: [birth year], 66 y.o.   MRN: [id number] [facility] Surgery Progress Note     Subjective: CC-  No new complaints. Denies any drainage from umbilicus since admission. WBC 4.9, afebrile.  Objective: Vital signs in last 24 hours: Temp:  [98.4 F (36.9 C)] 98.4 F (36.9 C) (02/01 0807) Pulse Rate:  [51-95] 81 (02/01 0807) Resp:  [9-20] 11 (02/01 0807) BP: (71-113)/(40-86) 93/51 (02/01 0807) SpO2:  [83 %-100 %] 100 % (02/01 0807)    Intake/Output from previous day: 01/31 0701 - 02/01 0700 In: 3325.9 [I.V.:1000; IV Piggyback:2325.9] Out: -  Intake/Output this shift: No intake/output data recorded.  PE: Gen:  Alert, NAD Abd: well healed open RUQ incision, obese, soft, NT, +BS, no masses, hernias, or organomegaly. Stable faint cellulitis extends bilaterally from umbilicus, no expressible fluid in or around umbilicus, no induration or fluctuance  Lab Results:  Recent Labs    02/01/21 1656 02/02/21 2052 02/03/21 0321  WBC 7.6  --  4.9  HGB 13.4 12.6 11.2*  HCT 40.2 40.2 33.9*  PLT 123*  --  96*   BMET Recent Labs    02/02/21 1038 02/03/21 0321  NA 137 136  K 3.5 3.8  CL 98 102  CO2 28 24  GLUCOSE 93 130*  BUN 12 12  CREATININE 1.18* 1.30*  CALCIUM 7.7* 7.9*   PT/INR Recent Labs    02/02/21 0422  LABPROT 18.0*  INR 1.5*   CMP     Component Value Date/Time   NA 136 02/03/2021 0321   K 3.8 02/03/2021 0321   CL 102 02/03/2021 0321   CO2 24 02/03/2021 0321   GLUCOSE 130 (H) 02/03/2021 0321   BUN 12 02/03/2021 0321   CREATININE 1.30 (H) 02/03/2021 0321   CALCIUM 7.9 (L) 02/03/2021 0321   PROT 5.2 (L) 02/03/2021 0321   ALBUMIN 2.1 (L) 02/03/2021 0321   AST 29 02/03/2021 0321   ALT 17 02/03/2021 0321   ALKPHOS 90 02/03/2021 0321   BILITOT 0.9 02/03/2021 0321   GFRNONAA 46 (L) 02/03/2021 0321   GFRAA 55 (L) 06/17/2015 0526   Lipase     Component Value Date/Time   LIPASE 28 02/01/2021 1656        Studies/Results: CT ABDOMEN PELVIS W CONTRAST  Result Date: 02/01/2021 CLINICAL DATA:  Acute abdominal pain. Wound drainage below belly button. EXAM: CT ABDOMEN AND PELVIS WITH CONTRAST TECHNIQUE: Multidetector CT imaging of the abdomen and pelvis was performed using the standard protocol following bolus administration of intravenous contrast. RADIATION DOSE REDUCTION: This exam was performed according to the departmental dose-optimization program which includes automated exposure control, adjustment of the mA and/or kV according to patient size and/or use of iterative reconstruction technique. CONTRAST:  147mL OMNIPAQUE IOHEXOL 300 MG/ML  SOLN COMPARISON:  CT abdomen and pelvis 01/08/2021. FINDINGS: Lower chest: No acute abnormality. Hepatobiliary: Patient is status post cholecystectomy. There is mild intra and extrahepatic biliary ductal dilatation which appears similar to the prior study. No focal liver lesions are identified. Pancreas: Unremarkable. No pancreatic ductal dilatation or surrounding inflammatory changes. Spleen: Mildly enlarged, unchanged. Adrenals/Urinary Tract: There are rounded hypodensities in both kidneys which are too small to characterize, likely cyst. Otherwise, the kidneys, adrenal glands and bladder are within normal limits. Stomach/Bowel: Stomach is within normal limits. Appendix appears normal. No evidence of bowel wall thickening, distention, or inflammatory changes. The appendix is not visualized. Vascular/Lymphatic: Aortic atherosclerosis. No enlarged abdominal or pelvic lymph nodes. Prominent pericardial  lymph node measures 7 mm. Reproductive: Uterus and bilateral adnexa are unremarkable. Other: There is a small amount of free fluid in the pelvis. Ascites has significantly decreased. Minimal fluid tracking along the bilateral pericolic gutters. Central mesenteric haziness. No focal body wall edema. There is anterior body wall subcutaneous stranding and skin thickening.  There is a small enhancing fluid collection in the subcutaneous tissues the level of the inferior umbilicus measuring 3.1 by 0.9 x 2.2 cm. No evidence for soft tissue gas. Musculoskeletal: Multilevel degenerative changes affect the spine. IMPRESSION: 1. Small enhancing fluid collections in the subcutaneous tissues at the level of the umbilicus measuring 3.1 x 0.9 x 2.2 cm. Abscess not excluded. No evidence for soft tissue gas. 2. Anterior abdominal wall subcutaneous stranding and skin thickening may be related to cellulitis. 3. Significant decrease in ascites.  Mild ascites persists. 4. Stable splenomegaly. 5.  Aortic Atherosclerosis (ICD10-I70.0). Electronically Signed   By: Ronney Asters M.D.   On: 02/01/2021 20:34   IR ABDOMEN US LIMITED  Result Date: 02/02/2021 CLINICAL DATA:  Abdominal distension, assess for ascites EXAM: LIMITED ABDOMEN ULTRASOUND FOR ASCITES TECHNIQUE: Limited ultrasound survey for ascites was performed in all four abdominal quadrants. COMPARISON:  02/01/2021 CT FINDINGS: Ultrasound of the abdominal 4 quadrants demonstrates no significant abdominopelvic ascites that warrants paracentesis. Also no significant subcutaneous umbilical fluid collection that warrants aspiration. IMPRESSION: No significant abdominopelvic ascites.  Paracentesis not performed Electronically Signed   By: Jerilynn Mages.  Shick M.D.   On: 02/02/2021 16:12    Anti-infectives: Anti-infectives (From admission, onward)    Start     Dose/Rate Route Frequency Ordered Stop   02/03/21 0800  vancomycin (VANCOCIN) IVPB 1000 mg/200 mL premix        1,000 mg 200 mL/hr over 60 Minutes Intravenous Every 24 hours 02/02/21 0713     02/02/21 1445  metroNIDAZOLE (FLAGYL) IVPB 500 mg        500 mg 100 mL/hr over 60 Minutes Intravenous Every 12 hours 02/02/21 1431     02/02/21 1445  cefTRIAXone (ROCEPHIN) 2 g in sodium chloride 0.9 % 100 mL IVPB        2 g 200 mL/hr over 30 Minutes Intravenous Every 24 hours 02/02/21 1431      02/02/21 1430  ceFEPIme (MAXIPIME) 2 g in sodium chloride 0.9 % 100 mL IVPB  Status:  Discontinued        2 g 200 mL/hr over 30 Minutes Intravenous Every 8 hours 02/02/21 1428 02/02/21 1431   02/02/21 0530  vancomycin (VANCOREADY) IVPB 1500 mg/300 mL        1,500 mg 150 mL/hr over 120 Minutes Intravenous  Once 02/02/21 0528 02/02/21 1610        Assessment/Plan Subcutaneous periumbilical abscess with cellulitis - CT scan shows a small fluid collection (3.1 x 0.9 x 2.2cm) deep to the umbilicus with cellulitis.  - spontaneously drained PTA, no further drainage, and no induration or fluctuance on exam - WBC WNL, afebrile - Continue broad spectrum antibiotics for now. If patient worsens we can consider I&D.   ID - vancomycin/ rocephin/ flagyl VTE - SCDs FEN - NPO Foley - none   Hypotension Hypoalbuminemia Coagulopathy Thrombocytopenia Ascites FOBT+ HTN HLD CKD Hypothyroidism Seizures Anxiety/ depression Obesity BMI 37.79  Moderate Medical Decision Making   LOS: 1 day    Wellington Hampshire, [facility] 02/03/2021, 8:32 AM Please see Amion for pager number during day hours 7:00am-4:30pm

## 2021-02-03 NOTE — Telephone Encounter (Signed)
-----   Message from Sharyn Creamer, MD sent at 02/02/2021  6:11 PM EST ----- Hi Ammie, could you please help arrange GI clinic follow up in 1 month with me or an APP for heme positive stools and possible cirrhosis?  Thanks, Lyndee Leo

## 2021-02-03 NOTE — Assessment & Plan Note (Addendum)
History of alcohol abuse Hyperbilirubinemia  Patient with history of alcohol abuse.  Concern is for some degree of alcoholic liver disease.  No significant ascites noted for drainage.  Seen by gastroenterology.  Outpatient follow-up.  Bilirubin is noted to be stable. As per patient she has not had any alcoholic beverages in over 2 years.

## 2021-02-03 NOTE — Assessment & Plan Note (Addendum)
Patient presented with complaints of drainage from wound of her abdomen.  CT scan of the abdomen and notes anterior wall stranding with 3.1 x 0.9 x 2.2 cm fluid collection present.    Patient initially started on vancomycin.   General surgery was consulted.  Antibiotic regimen was changed over to vancomycin, ceftriaxone and metronidazole.  No significant fluid collection was noted when interventional radiology was consulted for possible drainage.   Abdominal cellulitis appears to have improved.  Patient was changed over to oral Augmentin.  Feels much better this morning.  Seems to be okay for discharge.  We will continue antibiotics for few more days.

## 2021-02-03 NOTE — Assessment & Plan Note (Addendum)
Initial blood pressures were as low as 40 systolic.  She was noted to be on metoprolol spironolactone and furosemide at home.  She was given fluid bolus as well as albumin.  Antihypertensive medications were held. Blood pressure has improved.  Cortisol level was noted to be low.  Please see under adrenal insufficiency.

## 2021-02-03 NOTE — Evaluation (Signed)
Occupational Therapy Evaluation Patient Details Name: Carla Leonard MRN: [id number] DOB: [birth year] Today's Date: 02/03/2021   History of Present Illness Carla Leonard is a 66 y.o. female who presented with drainage from her bellybutton adn abdominal swelling. Admitted for cellulitis. Pt medical history significant for tobacco abuse, possible COPD, HTN, HLD, CKD IIIa, seizure like activity, and anxiety/ depression/ bipolar   Clinical Impression   Carla Leonard is mod I at baseline with intermittent use of SPC, she does not drive and her daughter or friend assist as needed. She lives alone in a 1 level apartment, 1 STE. Upon evaluation, pt was extremely tangential and required constant cues for re-direction and attention to task. She completed bed mobility with supervision and increased time, and close min guard for transfers and ambulation with RW, she is limited by back pain and poor activity tolerance. She also requires up to mod A for LB ADLs due to back pain. PT will benefit from OT acutely. Recommend d/c to home with [place] as pt does not drive 2/2 hx of seizures.    Recommendations for follow up therapy are one component of a multi-disciplinary discharge planning process, led by the attending physician.  Recommendations may be updated based on patient status, additional functional criteria and insurance authorization.   Follow Up Recommendations  Home health OT    Assistance Recommended at Discharge Intermittent Supervision/Assistance  Patient can return home with the following A little help with walking and/or transfers;A little help with bathing/dressing/bathroom;Assistance with cooking/housework;Assist for transportation    Functional Status Assessment  Patient has had a recent decline in their functional status and demonstrates the ability to make significant improvements in function in a reasonable and predictable amount of time.  Equipment Recommendations  Tub/shower bench (RW)        Precautions / Restrictions Precautions Precautions: Fall Precaution Comments: back pain Restrictions Weight Bearing Restrictions: No      Mobility Bed Mobility Overal bed mobility: Needs Assistance Bed Mobility: Sit to Supine, Supine to Sit     Supine to sit: Supervision Sit to supine: Supervision   General bed mobility comments: incrased time    Transfers Overall transfer level: Needs assistance Equipment used: Rolling walker (2 wheels) Transfers: Sit to/from Stand Sit to Stand: Min guard                      ADL either performed or assessed with clinical judgement   ADL Overall ADL's : Needs assistance/impaired Eating/Feeding: Independent;Sitting   Grooming: Min guard;Standing   Upper Body Bathing: Supervision/ safety;Sitting   Lower Body Bathing: Moderate assistance;Sit to/from stand   Upper Body Dressing : Set up;Supervision/safety;Sitting   Lower Body Dressing: Moderate assistance;Sit to/from stand   Toilet Transfer: Min guard;Rolling walker (2 wheels);Ambulation   Toileting- Clothing Manipulation and Hygiene: Min guard;Sit to/from stand       Functional mobility during ADLs: Min guard;Rolling walker (2 wheels)       Vision Baseline Vision/History: 1 Wears glasses Ability to See in Adequate Light: 0 Adequate Patient Visual Report: No change from baseline Vision Assessment?: No apparent visual deficits            Pertinent Vitals/Pain Pain Assessment Pain Assessment: Faces Faces Pain Scale: Hurts a little bit Pain Location: back R>L Pain Descriptors / Indicators: Discomfort, Grimacing Pain Intervention(s): Monitored during session     Hand Dominance Right   Extremity/Trunk Assessment Upper Extremity Assessment Upper Extremity Assessment: Generalized weakness   Lower Extremity Assessment Lower Extremity  Assessment: Defer to PT evaluation   Cervical / Trunk Assessment Cervical / Trunk Assessment: Other exceptions Cervical /  Trunk Exceptions: increased body habitus   Communication Communication Communication: No difficulties (tangential.)   Cognition Arousal/Alertness: Awake/alert Behavior During Therapy: WFL for tasks assessed/performed, Anxious Overall Cognitive Status: No family/caregiver present to determine baseline cognitive functioning           General Comments: pt extremely tangential. requrires verbal cues to limit self-distraction. A&Ox4.     General Comments  VSS on RA. pt with multiple bruises BUE, back and BLE     Home Living Family/patient expects to be discharged to:: Private residence Living Arrangements: Alone Available Help at Discharge: Family;Friend(s);Available PRN/intermittently Type of Home: Apartment Home Access: Stairs to enter Entrance Stairs-Number of Steps: 1 Entrance Stairs-Rails: Right Home Layout: One level     Bathroom Shower/Tub: Tub/shower unit;Sponge bathes at baseline   [employer]: Standard     Home Equipment: Kasandra Knudsen - single point   Additional Comments: daughter assists as needed, friend at apartment complex assists as needed      Prior Functioning/Environment Prior Level of Function : Independent/Modified Independent             Mobility Comments: SPC intermittently ADLs Comments: sponge bathes otherwise indep. Pt states she is unable to clean her apartment they way she would like to because of fatigue.        OT Problem List: Decreased strength;Decreased range of motion;Decreased activity tolerance;Decreased safety awareness;Decreased knowledge of use of DME or AE;Decreased knowledge of precautions      OT Treatment/Interventions: Self-care/ADL training;Therapeutic exercise;Therapeutic activities;DME and/or AE instruction;Patient/family education;Balance training    OT Goals(Current goals can be found in the care plan section) Acute Rehab OT Goals Patient Stated Goal: get my legs stronger OT Goal Formulation: With patient Time For  Goal Achievement: 02/17/21 Potential to Achieve Goals: Good ADL Goals Pt Will Perform Lower Body Dressing: with modified independence;sit to/from stand Pt Will Transfer to Toilet: with modified independence;ambulating Pt Will Perform Tub/Shower Transfer: Tub transfer;with supervision;tub bench Pt/caregiver will Perform Home Exercise Program: Increased ROM;Both right and left upper extremity;With theraband;With written HEP provided  OT Frequency: Min 2X/week       AM-PAC OT "6 Clicks" Daily Activity     Outcome Measure Help from another person eating meals?: None Help from another person taking care of personal grooming?: A Little Help from another person toileting, which includes using toliet, bedpan, or urinal?: A Little Help from another person bathing (including washing, rinsing, drying)?: A Lot Help from another person to put on and taking off regular upper body clothing?: A Little Help from another person to put on and taking off regular lower body clothing?: A Lot 6 Click Score: 17   End of Session Equipment Utilized During Treatment: Gait belt;Rolling walker (2 wheels) Nurse Communication: Mobility status  Activity Tolerance: Patient tolerated treatment well Patient left: in bed;with call bell/phone within reach;with bed alarm set  OT Visit Diagnosis: Other abnormalities of gait and mobility (R26.89);Muscle weakness (generalized) (M62.81);History of falling (Z91.81)                Time: 0254-2706 OT Time Calculation (min): 24 min Charges:  OT General Charges $OT Visit: 1 Visit OT Evaluation $OT Eval Moderate Complexity: 1 Mod OT Treatments $Therapeutic Activity: 8-22 mins  Alani Sabbagh A Norm Wray 02/03/2021, 5:10 PM

## 2021-02-03 NOTE — Assessment & Plan Note (Addendum)
Coagulopathy  Likely related to history of alcoholism and unspecified liver disease.  Will need outpatient evaluation.

## 2021-02-03 NOTE — Progress Notes (Signed)
[facility] PROGRESS NOTE   AARTI MANKOWSKI LKG:[id number] DOB: Oct 04, 1955 DOA: 02/01/2021  1 DOS: the patient was seen and examined on 02/03/2021  PCP: Bonnita Nasuti, MD  Brief History and Hospital Course:  66 y.o. female with medical history significant of hypertension, hypothyroidism, nephrolithiasis, thrombocytopenia, seizures, anxiety, depression who presented for evaluation of drainage from her bellybutton.  Patient states that for the last 3 months she has been having abdominal swelling.  She had been placed on Lasix and spironolactone and had been taking the medications as advised.  However here over the last couple weeks she had noted drainage coming from her bellybutton.  She was evaluated initially in the [facility] emergency department.  There was concern for cellulitis.  She was given oral antibiotic prescriptions.  Symptoms did not improve so she decided to present here.  Concern raised for cellulitis of the abdominal wall.  Initially there was concern for abscess.  Surgery was consulted.  Placed on broad-spectrum antibiotics.  Hospitalized for further management.    Consultants: General surgery.  Critical care medicine.  Gastroenterology.  Procedures: None yet    Subjective: States that abdominal pain is better today compared to yesterday.  Denies any nausea vomiting.  No fever or chills.    Assessment/Plan:  Assessment and Plan: * Cellulitis of abdominal wall- (present on admission) Patient presented with complaints of drainage from wound of her abdomen.  CT scan of the abdomen and notes anterior wall stranding with 3.1 x 0.9 x 2.2 cm fluid collection present.    Patient initially started on vancomycin.   General surgery was consulted.  Antibiotic regimen was changed over to vancomycin, ceftriaxone and metronidazole.  No significant fluid collection was noted when interventional radiology was consulted for possible drainage.   Follow-up on cultures.   General surgery continues to follow.    Hypotension- (present on admission) Initial blood pressures were as low as 40 systolic.  She was noted to be on metoprolol spironolactone and furosemide at home.  She was given fluid bolus as well as albumin.  Antihypertensive medications were held. Blood pressure has improved.  Continue to hold antihypertensives.  Does not appear to be particularly symptomatic.  Check cortisol level. Patient was also evaluated by critical care medicine.  Heme positive stool- (present on admission) Seen by gastroenterology.  Plan is to do outpatient evaluation.  No evidence of overt bleeding.  Hemoglobin stable for the most part.  Slight drop is likely dilutional.  Lactic acidosis- (present on admission) Likely due to low blood pressures.  Improved with fluid boluses.  Ascites- (present on admission) History of alcohol abuse Hyperbilirubinemia  Patient with history of alcohol abuse.  Concern is for some degree of alcoholic liver disease.  No significant ascites noted for drainage.  Seen by gastroenterology.  Outpatient follow-up.  Bilirubin is noted to be stable. As per patient she has not had any alcoholic beverages in over 2 years.  Hypokalemia- (present on admission) Repleted.  Magnesium 1.8.  Thrombocytopenia ([place])- (present on admission) Coagulopathy  Likely related to history of alcoholism and unspecified liver disease.  Continue to monitor.  CKD (chronic kidney disease), stage III ([place])- (present on admission) Chronic kidney disease stage IIIa  Monitor creatinine closely.  Monitor urine output.  Seizure disorder ([place]) Noted to be on Keppra which is being continued.  She mentions that she was diagnosed with seizures back in October.  Based on Care Everywhere it looks like she was diagnosed in October 2021.  She  had a MRI of the head with and without contrast.  Results are available in care everywhere.  No significant abnormalities were noted.  Recommend  follow-up with outpatient providers.  Continue Keppra.  Anxiety and depression- (present on admission) Xanax as needed.   Obesity Estimated body mass index is 37.79 kg/m as calculated from the following:   Height as of this encounter: 5\' 1"  (1.549 m).   Weight as of this encounter: 90.7 kg.   DVT Prophylaxis: SCDs Code Status: Full code Family Communication: Discussed with patient.  No family at bedside Disposition Plan: To be determined  Status is: Inpatient Remains inpatient appropriate because: Abdominal wall cellulitis, hypertension  Planned Discharge Destination: Home with Home Health           Medications: Scheduled:  feeding supplement  237 mL Oral BID BM   levETIRAcetam  500 mg Oral BID   mometasone-formoterol  2 puff Inhalation BID   pantoprazole (PROTONIX) IV  40 mg Intravenous Q12H   sodium chloride flush  3 mL Intravenous Q12H   traZODone  50 mg Oral QHS   Continuous:  sodium chloride Stopped (02/03/21 0253)   cefTRIAXone (ROCEPHIN)  IV Stopped (02/02/21 1903)   metronidazole Stopped (02/03/21 3235)   vancomycin     TDD:UKGURKYHCWCBJ **OR** acetaminophen, albuterol, ALPRAZolam, ondansetron **OR** ondansetron (ZOFRAN) IV  Antibiotics: Anti-infectives (From admission, onward)    Start     Dose/Rate Route Frequency Ordered Stop   02/03/21 0800  vancomycin (VANCOCIN) IVPB 1000 mg/200 mL premix        1,000 mg 200 mL/hr over 60 Minutes Intravenous Every 24 hours 02/02/21 0713     02/02/21 1445  metroNIDAZOLE (FLAGYL) IVPB 500 mg        500 mg 100 mL/hr over 60 Minutes Intravenous Every 12 hours 02/02/21 1431     02/02/21 1445  cefTRIAXone (ROCEPHIN) 2 g in sodium chloride 0.9 % 100 mL IVPB        2 g 200 mL/hr over 30 Minutes Intravenous Every 24 hours 02/02/21 1431     02/02/21 1430  ceFEPIme (MAXIPIME) 2 g in sodium chloride 0.9 % 100 mL IVPB  Status:  Discontinued        2 g 200 mL/hr over 30 Minutes Intravenous Every 8 hours 02/02/21 1428  02/02/21 1431   02/02/21 0530  vancomycin (VANCOREADY) IVPB 1500 mg/300 mL        1,500 mg 150 mL/hr over 120 Minutes Intravenous  Once 02/02/21 0528 02/02/21 0838       Objective:  Vital Signs  Vitals:   02/03/21 0245 02/03/21 0315 02/03/21 0615 02/03/21 0807  BP:  (!) 92/49 (!) 105/50 (!) 93/51  Pulse: 79 77 84 81  Resp: 20 17 17 11   Temp:    98.4 F (36.9 C)  TempSrc:    Oral  SpO2: 95% 95% 97% 100%  Weight:      Height:        Intake/Output Summary (Last 24 hours) at 02/03/2021 0948 Last data filed at 02/03/2021 6283 Gross per 24 hour  Intake 2698.7 ml  Output --  Net 2698.7 ml   Filed Weights   02/02/21 0527  Weight: 90.7 kg    General appearance: Awake alert.  In no distress Resp: Clear to auscultation bilaterally.  Normal effort Cardio: S1-S2 is normal regular.  No S3-S4.  No rubs murmurs or bruit GI: Erythema noted over the abdominal area more towards right side.  Mildly tender.  No areas of induration.  Bowel sounds present.  No masses organomegaly. Extremities: Mild edema bilateral lower extremities Neurologic: Alert and oriented x3.  No focal neurological deficits.    Lab Results:  Data Reviewed: I have personally reviewed labs and imaging study reports  CBC: Recent Labs  Lab 02/01/21 1656 02/02/21 2052 02/03/21 0321  WBC 7.6  --  4.9  HGB 13.4 12.6 11.2*  HCT 40.2 40.2 33.9*  MCV 99.8  --  102.4*  PLT 123*  --  96*    Basic Metabolic Panel: Recent Labs  Lab 02/01/21 1656 02/02/21 1038 02/03/21 0321  NA 135 137 136  K 3.4* 3.5 3.8  CL 93* 98 102  CO2 34* 28 24  GLUCOSE 110* 93 130*  BUN 11 12 12   CREATININE 1.27* 1.18* 1.30*  CALCIUM 8.5* 7.7* 7.9*  MG  --  1.8  --     GFR: Estimated Creatinine Clearance: 44.3 mL/min (A) (by C-G formula based on SCr of 1.3 mg/dL (H)).  Liver Function Tests: Recent Labs  Lab 02/01/21 1656 02/02/21 1038 02/03/21 0321  AST 38 37 29  ALT 22 19 17   ALKPHOS 123 92 90  BILITOT 1.6* 1.3* 0.9   PROT 6.6 5.4* 5.2*  ALBUMIN 2.2* 2.2* 2.1*    Recent Labs  Lab 02/01/21 1656  LIPASE 28   Recent Labs  Lab 02/02/21 1038  AMMONIA 33    Coagulation Profile: Recent Labs  Lab 02/02/21 0422  INR 1.5*     Recent Results (from the past 240 hour(s))  Urine Culture     Status: Abnormal   Collection Time: 02/01/21 11:37 PM   Specimen: Urine, Clean Catch  Result Value Ref Range Status   Specimen Description URINE, CLEAN CATCH  Final   Special Requests NONE  Final   Culture (A)  Final    <10,000 COLONIES/mL INSIGNIFICANT GROWTH Performed at [facility], [place] [address], [place], [place] [address]    Report Status 02/03/2021 FINAL  Final  Resp Panel by RT-PCR (Flu A&B, Covid) Nasopharyngeal Swab     Status: None   Collection Time: 02/02/21  4:18 AM   Specimen: Nasopharyngeal Swab; Nasopharyngeal(NP) swabs in vial transport medium  Result Value Ref Range Status   SARS Coronavirus 2 by RT PCR NEGATIVE NEGATIVE Final    Comment: (NOTE) SARS-CoV-2 target nucleic acids are NOT DETECTED.  The SARS-CoV-2 RNA is generally detectable in upper respiratory specimens during the acute phase of infection. The lowest concentration of SARS-CoV-2 viral copies this assay can detect is 138 copies/mL. A negative result does not preclude SARS-Cov-2 infection and should not be used as the sole basis for treatment or other patient management decisions. A negative result may occur with  improper specimen collection/handling, submission of specimen other than nasopharyngeal swab, presence of viral mutation(s) within the areas targeted by this assay, and inadequate number of viral copies(<138 copies/mL). A negative result must be combined with clinical observations, patient history, and epidemiological information. The expected result is Negative.  Fact Sheet for Patients:  EntrepreneurPulse.com.au  Fact Sheet for Healthcare Providers:   IncredibleEmployment.be  This test is no t yet approved or cleared by the [place] FDA and  has been authorized for detection and/or diagnosis of SARS-CoV-2 by FDA under an Emergency Use Authorization (EUA). This EUA will remain  in effect (meaning this test can be used) for the duration of the COVID-19 declaration under Section 564(b)(1) of the Act, 21 U.S.C.section 360bbb-3(b)(1), unless the authorization is terminated  or revoked  sooner.       Influenza A by PCR NEGATIVE NEGATIVE Final   Influenza B by PCR NEGATIVE NEGATIVE Final    Comment: (NOTE) The Xpert Xpress SARS-CoV-2/FLU/RSV plus assay is intended as an aid in the diagnosis of influenza from Nasopharyngeal swab specimens and should not be used as a sole basis for treatment. Nasal washings and aspirates are unacceptable for Xpert Xpress SARS-CoV-2/FLU/RSV testing.  Fact Sheet for Patients: EntrepreneurPulse.com.au  Fact Sheet for Healthcare Providers: IncredibleEmployment.be  This test is not yet approved or cleared by the [place] FDA and has been authorized for detection and/or diagnosis of SARS-CoV-2 by FDA under an Emergency Use Authorization (EUA). This EUA will remain in effect (meaning this test can be used) for the duration of the COVID-19 declaration under Section 564(b)(1) of the Act, 21 U.S.C. section 360bbb-3(b)(1), unless the authorization is terminated or revoked.  Performed at [facility], [place] [address] [place], [place] [address]   Culture, blood (single) w Reflex to ID Panel     Status: None (Preliminary result)   Collection Time: 02/02/21  4:50 AM   Specimen: BLOOD  Result Value Ref Range Status   Specimen Description BLOOD RIGHT HAND  Final   Special Requests   Final    BOTTLES DRAWN AEROBIC AND ANAEROBIC Blood Culture results may not be optimal due to an inadequate volume of blood received in culture bottles   Culture    Final    NO GROWTH < 12 HOURS Performed at [facility], [place] [address] [place], [place] [address]    Report Status PENDING  Incomplete      Radiology Studies: CT ABDOMEN PELVIS W CONTRAST  Result Date: 02/01/2021 CLINICAL DATA:  Acute abdominal pain. Wound drainage below belly button. EXAM: CT ABDOMEN AND PELVIS WITH CONTRAST TECHNIQUE: Multidetector CT imaging of the abdomen and pelvis was performed using the standard protocol following bolus administration of intravenous contrast. RADIATION DOSE REDUCTION: This exam was performed according to the departmental dose-optimization program which includes automated exposure control, adjustment of the mA and/or kV according to patient size and/or use of iterative reconstruction technique. CONTRAST:  148mL OMNIPAQUE IOHEXOL 300 MG/ML  SOLN COMPARISON:  CT abdomen and pelvis 01/08/2021. FINDINGS: Lower chest: No acute abnormality. Hepatobiliary: Patient is status post cholecystectomy. There is mild intra and extrahepatic biliary ductal dilatation which appears similar to the prior study. No focal liver lesions are identified. Pancreas: Unremarkable. No pancreatic ductal dilatation or surrounding inflammatory changes. Spleen: Mildly enlarged, unchanged. Adrenals/Urinary Tract: There are rounded hypodensities in both kidneys which are too small to characterize, likely cyst. Otherwise, the kidneys, adrenal glands and bladder are within normal limits. Stomach/Bowel: Stomach is within normal limits. Appendix appears normal. No evidence of bowel wall thickening, distention, or inflammatory changes. The appendix is not visualized. Vascular/Lymphatic: Aortic atherosclerosis. No enlarged abdominal or pelvic lymph nodes. Prominent pericardial lymph node measures 7 mm. Reproductive: Uterus and bilateral adnexa are unremarkable. Other: There is a small amount of free fluid in the pelvis. Ascites has significantly decreased. Minimal fluid tracking along the bilateral  pericolic gutters. Central mesenteric haziness. No focal body wall edema. There is anterior body wall subcutaneous stranding and skin thickening. There is a small enhancing fluid collection in the subcutaneous tissues the level of the inferior umbilicus measuring 3.1 by 0.9 x 2.2 cm. No evidence for soft tissue gas. Musculoskeletal: Multilevel degenerative changes affect the spine. IMPRESSION: 1. Small enhancing fluid collections in the subcutaneous tissues at the  level of the umbilicus measuring 3.1 x 0.9 x 2.2 cm. Abscess not excluded. No evidence for soft tissue gas. 2. Anterior abdominal wall subcutaneous stranding and skin thickening may be related to cellulitis. 3. Significant decrease in ascites.  Mild ascites persists. 4. Stable splenomegaly. 5.  Aortic Atherosclerosis (ICD10-I70.0). Electronically Signed   By: Ronney Asters M.D.   On: 02/01/2021 20:34   IR ABDOMEN US LIMITED  Result Date: 02/02/2021 CLINICAL DATA:  Abdominal distension, assess for ascites EXAM: LIMITED ABDOMEN ULTRASOUND FOR ASCITES TECHNIQUE: Limited ultrasound survey for ascites was performed in all four abdominal quadrants. COMPARISON:  02/01/2021 CT FINDINGS: Ultrasound of the abdominal 4 quadrants demonstrates no significant abdominopelvic ascites that warrants paracentesis. Also no significant subcutaneous umbilical fluid collection that warrants aspiration. IMPRESSION: No significant abdominopelvic ascites.  Paracentesis not performed Electronically Signed   By: Jerilynn Mages.  Shick M.D.   On: 02/02/2021 16:12       LOS: 1 day   [facility] Pager on [email or web address]  02/03/2021, 9:48 AM

## 2021-02-03 NOTE — Progress Notes (Signed)
°  Transition of Care [facility]) Screening Note   Patient Details  Name: Carla Leonard Date of Birth: [birth year]   Transition of Care [facility]) CM/SW Contact:    Cyndi Bender, RN Phone Number: 02/03/2021, 4:11 PM    Transition of Care Department [facility]) has reviewed patient and no TOC needs have been identified at this time. We will continue to monitor patient advancement through interdisciplinary progression rounds. If new patient transition needs arise, please place a TOC consult.

## 2021-02-03 NOTE — Assessment & Plan Note (Signed)
Noted to be on Keppra which is being continued.  She mentions that she was diagnosed with seizures back in October.  Based on Care Everywhere it looks like she was diagnosed in October 2021.  She had a MRI of the head with and without contrast.  Results are available in care everywhere.  No significant abnormalities were noted.  Recommend follow-up with outpatient providers.  Continue Keppra.

## 2021-02-03 NOTE — Assessment & Plan Note (Addendum)
Chronic kidney disease stage IIIa  Renal function stable for the most part.

## 2021-02-03 NOTE — Hospital Course (Signed)
66 y.o. female with medical history significant of hypertension, hypothyroidism, nephrolithiasis, thrombocytopenia, seizures, anxiety, depression who presented for evaluation of drainage from her bellybutton.  Patient states that for the last 3 months she has been having abdominal swelling.  She had been placed on Lasix and spironolactone and had been taking the medications as advised.  However here over the last couple weeks she had noted drainage coming from her bellybutton.  She was evaluated initially in the [facility] emergency department.  There was concern for cellulitis.  She was given oral antibiotic prescriptions.  Symptoms did not improve so she decided to present here.  Concern raised for cellulitis of the abdominal wall.  Initially there was concern for abscess.  Surgery was consulted.  Placed on broad-spectrum antibiotics.  Hospitalized for further management.

## 2021-02-03 NOTE — Progress Notes (Signed)
Carla Leonard is alert and oriented x4. Connected to telemetry. Skin assessed with Musician. No complaints of pain. Oriented to room, bed controls, and call light. Bed placed in the lowest position.

## 2021-02-03 NOTE — Progress Notes (Signed)
Pharmacy Antibiotic Note  Carla Leonard is a 66 y.o. female admitted on 02/01/2021 with cellulitis.  Pharmacy has been consulted for vancomycin dosing. Pt is afebrile and WBC is WNL. Scr is increased from  yesterday.   Plan: Change vancomycin to 750mg  IV Q24H F/u renal fxn, C&S, clinical status and peak/trough at SS  Height: 5\' 1"  (154.9 cm) Weight: 90.7 kg (200 lb) IBW/kg (Calculated) : 47.8  Temp (24hrs), Avg:98.4 F (36.9 C), Min:98.4 F (36.9 C), Max:98.4 F (36.9 C)  Recent Labs  Lab 02/01/21 1656 02/02/21 0357 02/02/21 1038 02/02/21 1110 02/03/21 0321  WBC 7.6  --   --   --  4.9  CREATININE 1.27*  --  1.18*  --  1.30*  LATICACIDVEN  --  2.1*  --  1.9  --     Estimated Creatinine Clearance: 44.3 mL/min (A) (by C-G formula based on SCr of 1.3 mg/dL (H)).    Allergies  Allergen Reactions   Sulfa Antibiotics Anaphylaxis   Prednisone Other (See Comments)    hallucinations   Nsaids Other (See Comments)    Stomach pain, diarrhea   Other Other (See Comments)    Steroids: hallucinations    Antimicrobials this admission: CTX 1/31>> Flagyl 1/31>> Vanc 1/31>>  Dose adjustments this admission: 2/1 Changed vanc from 1g to 750mg  Q24H  Microbiology results: 1/31 BCx: NGTD 1/30 Urine - insig growth  Thank you for allowing pharmacy to be a part of this patients care.  Chelse Matas, Rande Lawman 02/03/2021 10:16 AM

## 2021-02-03 NOTE — Assessment & Plan Note (Addendum)
Supplemented 

## 2021-02-03 NOTE — Assessment & Plan Note (Signed)
Seen by gastroenterology.  Plan is to do outpatient evaluation.  No evidence of overt bleeding.  Hemoglobin stable for the most part.  Slight drop is likely dilutional.

## 2021-02-03 NOTE — Assessment & Plan Note (Signed)
Xanax as needed.

## 2021-02-03 NOTE — Telephone Encounter (Signed)
Left  message to please call back, trying to schedule a hospital F/U visit with Dr. Lorenso Courier in 1 month

## 2021-02-03 NOTE — Assessment & Plan Note (Signed)
Likely due to low blood pressures.  Improved with fluid boluses.

## 2021-02-04 ENCOUNTER — Telehealth: Payer: Self-pay

## 2021-02-04 LAB — COMPREHENSIVE METABOLIC PANEL
ALT: 19 U/L (ref 0–44)
AST: 29 U/L (ref 15–41)
Albumin: 2 g/dL — ABNORMAL LOW (ref 3.5–5.0)
Alkaline Phosphatase: 89 U/L (ref 38–126)
Anion gap: 8 (ref 5–15)
BUN: 13 mg/dL (ref 8–23)
CO2: 25 mmol/L (ref 22–32)
Calcium: 8 mg/dL — ABNORMAL LOW (ref 8.9–10.3)
Chloride: 103 mmol/L (ref 98–111)
Creatinine, Ser: 1.31 mg/dL — ABNORMAL HIGH (ref 0.44–1.00)
GFR, Estimated: 45 mL/min — ABNORMAL LOW (ref >60)
Glucose, Bld: 117 mg/dL — ABNORMAL HIGH (ref 70–99)
Potassium: 3.5 mmol/L (ref 3.5–5.1)
Sodium: 136 mmol/L (ref 135–145)
Total Bilirubin: 0.6 mg/dL (ref 0.3–1.2)
Total Protein: 5.5 g/dL — ABNORMAL LOW (ref 6.5–8.1)

## 2021-02-04 LAB — CBC
HCT: 33.2 % — ABNORMAL LOW (ref 36.0–46.0)
Hemoglobin: 10.7 g/dL — ABNORMAL LOW (ref 12.0–15.0)
MCH: 32.4 pg (ref 26.0–34.0)
MCHC: 32.2 g/dL (ref 30.0–36.0)
MCV: 100.6 fL — ABNORMAL HIGH (ref 80.0–100.0)
Platelets: 88 10*3/uL — ABNORMAL LOW (ref 150–400)
RBC: 3.3 MIL/uL — ABNORMAL LOW (ref 3.87–5.11)
RDW: 15.7 % — ABNORMAL HIGH (ref 11.5–15.5)
WBC: 4.7 10*3/uL (ref 4.0–10.5)
nRBC: 0 % (ref 0.0–0.2)

## 2021-02-04 LAB — MAGNESIUM: Magnesium: 1.9 mg/dL (ref 1.7–2.4)

## 2021-02-04 LAB — CORTISOL-AM, BLOOD: Cortisol - AM: 1.2 ug/dL — ABNORMAL LOW (ref 6.7–22.6)

## 2021-02-04 MED ORDER — AMOXICILLIN-POT CLAVULANATE 875-125 MG PO TABS
1.0000 | ORAL_TABLET | Freq: Two times a day (BID) | ORAL | Status: DC
  Administered 2021-02-04 – 2021-02-05 (×3): 1 via ORAL
  Filled 2021-02-04 (×3): qty 1

## 2021-02-04 MED ORDER — COSYNTROPIN 0.25 MG IJ SOLR
0.2500 mg | Freq: Once | INTRAMUSCULAR | Status: AC
  Administered 2021-02-05: 0.25 mg via INTRAVENOUS
  Filled 2021-02-04: qty 0.25

## 2021-02-04 MED ORDER — PANTOPRAZOLE SODIUM 40 MG PO TBEC
40.0000 mg | DELAYED_RELEASE_TABLET | Freq: Two times a day (BID) | ORAL | Status: DC
  Administered 2021-02-04 – 2021-02-05 (×2): 40 mg via ORAL
  Filled 2021-02-04 (×2): qty 1

## 2021-02-04 MED ORDER — POTASSIUM CHLORIDE CRYS ER 20 MEQ PO TBCR
40.0000 meq | EXTENDED_RELEASE_TABLET | Freq: Once | ORAL | Status: AC
  Administered 2021-02-04: 40 meq via ORAL
  Filled 2021-02-04: qty 2

## 2021-02-04 NOTE — Evaluation (Signed)
Physical Therapy Evaluation Patient Details Name: Carla Leonard MRN: [id number] DOB: Apr 20, 1955 Today's Date: 02/04/2021  History of Present Illness  66 y.o. female who presented with drainage from her bellybutton adn abdominal swelling. Admitted for cellulitis. Pt medical history significant for tobacco abuse, possible COPD, HTN, HLD, CKD IIIa, seizure like activity, and anxiety/ depression/ bipolar  Clinical Impression  Patient admitted with above diagnosis. Patient presents with generalized weakness, impaired balance, decreased activity tolerance, and back pain. Patient at supervision level for ambulation with RW. Patient tangential in conversation and forgetful of meaning of conversation requiring repetition of questions asked. Patient will benefit from skilled PT services during acute stay to address listed deficits. Recommend HHPT at discharge to maximize functional independence and safety.        Recommendations for follow up therapy are one component of a multi-disciplinary discharge planning process, led by the attending physician.  Recommendations may be updated based on patient status, additional functional criteria and insurance authorization.  Follow Up Recommendations Home health PT    Assistance Recommended at Discharge Intermittent Supervision/Assistance  Patient can return home with the following  A little help with bathing/dressing/bathroom;Assistance with cooking/housework;Assist for transportation;Help with stairs or ramp for entrance    Equipment Recommendations Rolling Frankey Botting (2 wheels)  Recommendations for Other Services       Functional Status Assessment Patient has had a recent decline in their functional status and demonstrates the ability to make significant improvements in function in a reasonable and predictable amount of time.     Precautions / Restrictions Precautions Precautions: Fall Precaution Comments: back pain Restrictions Weight Bearing  Restrictions: No      Mobility  Bed Mobility               General bed mobility comments: in recliner on arrival    Transfers Overall transfer level: Needs assistance Equipment used: Rolling Angell Honse (2 wheels) Transfers: Sit to/from Stand Sit to Stand: Supervision                Ambulation/Gait Ambulation/Gait assistance: Supervision Gait Distance (Feet): 150 Feet Assistive device: Rolling Aribella Vavra (2 wheels) Gait Pattern/deviations: Step-through pattern, Decreased stride length, Trunk flexed Gait velocity: decreased     General Gait Details: supervision for safety. No LOB noted throughout. Complains of back pain  Stairs            Wheelchair Mobility    Modified Rankin (Stroke Patients Only)       Balance Overall balance assessment: Needs assistance Sitting-balance support: No upper extremity supported, Feet supported Sitting balance-Leahy Scale: Fair     Standing balance support: Bilateral upper extremity supported, Reliant on assistive device for balance Standing balance-Leahy Scale: Poor Standing balance comment: reliant on RW                             Pertinent Vitals/Pain Pain Assessment Pain Assessment: Faces Faces Pain Scale: Hurts a little bit Pain Location: back R>L Pain Descriptors / Indicators: Discomfort, Grimacing Pain Intervention(s): Monitored during session    Home Living Family/patient expects to be discharged to:: Private residence Living Arrangements: Alone Available Help at Discharge: Family;Friend(s);Available PRN/intermittently Type of Home: Apartment Home Access: Stairs to enter Entrance Stairs-Rails: Right Entrance Stairs-Number of Steps: 1   Home Layout: One level Home Equipment: Cane - single point      Prior Function Prior Level of Function : Independent/Modified Independent  Mobility Comments: SPC intermittently ADLs Comments: sponge bathes otherwise indep. Pt states she is  unable to clean her apartment they way she would like to because of fatigue.     Hand Dominance   Dominant Hand: Right    Extremity/Trunk Assessment   Upper Extremity Assessment Upper Extremity Assessment: Defer to OT evaluation    Lower Extremity Assessment Lower Extremity Assessment: Generalized weakness    Cervical / Trunk Assessment Cervical / Trunk Assessment: Other exceptions Cervical / Trunk Exceptions: increased body habitus  Communication   Communication: No difficulties (tangential)  Cognition Arousal/Alertness: Awake/alert Behavior During Therapy: WFL for tasks assessed/performed, Anxious Overall Cognitive Status: No family/caregiver present to determine baseline cognitive functioning                                 General Comments: tangential and forgetting what she is talking about in mid conversation. requires repetition to questions due to distractibility. Requires redirectable cueing to stay on task. A&Ox 4        General Comments      Exercises     Assessment/Plan    PT Assessment Patient needs continued PT services  PT Problem List Decreased strength;Decreased activity tolerance;Decreased balance;Decreased mobility       PT Treatment Interventions DME instruction;Gait training;Stair training;Functional mobility training;Therapeutic exercise;Therapeutic activities;Balance training;Patient/family education    PT Goals (Current goals can be found in the Care Plan section)  Acute Rehab PT Goals Patient Stated Goal: to get back to her old self PT Goal Formulation: With patient Time For Goal Achievement: 02/18/21 Potential to Achieve Goals: Good    Frequency Min 3X/week     Co-evaluation               AM-PAC PT "6 Clicks" Mobility  Outcome Measure Help needed turning from your back to your side while in a flat bed without using bedrails?: A Little Help needed moving from lying on your back to sitting on the side of a flat  bed without using bedrails?: A Little Help needed moving to and from a bed to a chair (including a wheelchair)?: A Little Help needed standing up from a chair using your arms (e.g., wheelchair or bedside chair)?: A Little Help needed to walk in hospital room?: A Little Help needed climbing 3-5 steps with a railing? : A Little 6 Click Score: 18    End of Session   Activity Tolerance: Patient tolerated treatment well Patient left: in chair;with call bell/phone within reach;with chair alarm set Nurse Communication: Mobility status PT Visit Diagnosis: Unsteadiness on feet (R26.81);Muscle weakness (generalized) (M62.81)    Time: 6468-0321 PT Time Calculation (min) (ACUTE ONLY): 18 min   Charges:   PT Evaluation $PT Eval Moderate Complexity: 1 Mod          Lailanie Hasley A. Gilford Rile PT, DPT Acute Rehabilitation Services Pager [phone] Office [phone]   Linna Hoff 02/04/2021, 10:28 AM

## 2021-02-04 NOTE — Progress Notes (Signed)
[facility] PROGRESS NOTE   AZZURE GARABEDIAN MPN:[id number] DOB: 07-17-1955 DOA: 02/01/2021  2 DOS: the patient was seen and examined on 02/04/2021  PCP: Bonnita Nasuti, MD  Brief History and Hospital Course:  66 y.o. female with medical history significant of hypertension, hypothyroidism, nephrolithiasis, thrombocytopenia, seizures, anxiety, depression who presented for evaluation of drainage from her bellybutton.  Patient states that for the last 3 months she has been having abdominal swelling.  She had been placed on Lasix and spironolactone and had been taking the medications as advised.  However here over the last couple weeks she had noted drainage coming from her bellybutton.  She was evaluated initially in the [facility] emergency department.  There was concern for cellulitis.  She was given oral antibiotic prescriptions.  Symptoms did not improve so she decided to present here.  Concern raised for cellulitis of the abdominal wall.  Initially there was concern for abscess.  Surgery was consulted.  Placed on broad-spectrum antibiotics.  Hospitalized for further management.    Consultants: General surgery.  Critical care medicine.  Gastroenterology.  Procedures: None yet    Subjective: States that abdominal pain has resolved.  Denies any nausea vomiting.    Assessment/Plan:  * Cellulitis of abdominal wall- (present on admission) Patient presented with complaints of drainage from wound of her abdomen.  CT scan of the abdomen and notes anterior wall stranding with 3.1 x 0.9 x 2.2 cm fluid collection present.    Patient initially started on vancomycin.   General surgery was consulted.  Antibiotic regimen was changed over to vancomycin, ceftriaxone and metronidazole.  No significant fluid collection was noted when interventional radiology was consulted for possible drainage.   Abdominal cellulitis appears to have improved.  Discussed with general surgery.  Will transition  to Augmentin.  Cultures negative so far.  Hypotension- (present on admission) Initial blood pressures were as low as 40 systolic.  She was noted to be on metoprolol spironolactone and furosemide at home.  She was given fluid bolus as well as albumin.  Antihypertensive medications were held. Blood pressure has improved.  Cortisol level noted to be significantly low.  We will do a cosyntropin stimulation test tomorrow morning. Continue to hold antihypertensives.  Heme positive stool- (present on admission) Seen by gastroenterology.  Plan is to do outpatient evaluation.  No evidence of overt bleeding.  Hemoglobin stable for the most part.  Slight drop is likely dilutional.  Lactic acidosis- (present on admission) Likely due to low blood pressures.  Improved with fluid boluses.  Ascites- (present on admission) History of alcohol abuse Hyperbilirubinemia  Patient with history of alcohol abuse.  Concern is for some degree of alcoholic liver disease.  No significant ascites noted for drainage.  Seen by gastroenterology.  Outpatient follow-up.  Bilirubin is noted to be stable. As per patient she has not had any alcoholic beverages in over 2 years.  Hypokalemia- (present on admission) Will supplement again today.  Thrombocytopenia ([place])- (present on admission) Coagulopathy  Likely related to history of alcoholism and unspecified liver disease.  Will need outpatient evaluation.  CKD (chronic kidney disease), stage III ([place])- (present on admission) Chronic kidney disease stage IIIa  Renal function stable for the most part.  Monitor urine output.  Seizure disorder ([place]) Noted to be on Keppra which is being continued.  She mentions that she was diagnosed with seizures back in October.  Based on Care Everywhere it looks like she was diagnosed in October 2021.  She  had a MRI of the head with and without contrast.  Results are available in care everywhere.  No significant abnormalities were noted.   Recommend follow-up with outpatient providers.  Continue Keppra.  Anxiety and depression- (present on admission) Xanax as needed.   Obesity Estimated body mass index is 37.79 kg/m as calculated from the following:   Height as of this encounter: 5\' 1"  (1.549 m).   Weight as of this encounter: 90.7 kg.   DVT Prophylaxis: SCDs Code Status: Full code Family Communication: Discussed with patient.  No family at bedside Disposition Plan: Home with home health when medically stable  Status is: Inpatient Remains inpatient appropriate because: Abdominal wall cellulitis, hypertension  Planned Discharge Destination: Home with Home Health     Medications: Scheduled:  feeding supplement  237 mL Oral BID BM   levETIRAcetam  500 mg Oral BID   mometasone-formoterol  2 puff Inhalation BID   pantoprazole (PROTONIX) IV  40 mg Intravenous Q12H   pneumococcal 20-valent conjugate vaccine  0.5 mL Intramuscular Tomorrow-1000   sodium chloride flush  3 mL Intravenous Q12H   traZODone  50 mg Oral QHS   Continuous:  sodium chloride 100 mL/hr at 02/04/21 0250   cefTRIAXone (ROCEPHIN)  IV Stopped (02/03/21 1730)   metronidazole 500 mg (02/04/21 0250)   vancomycin 750 mg (02/04/21 1055)   YQI:[id number] **OR** acetaminophen, albuterol, ALPRAZolam, ondansetron **OR** ondansetron (ZOFRAN) IV  Antibiotics: Anti-infectives (From admission, onward)    Start     Dose/Rate Route Frequency Ordered Stop   02/03/21 1100  vancomycin (VANCOREADY) IVPB 750 mg/150 mL        750 mg 150 mL/hr over 60 Minutes Intravenous Every 24 hours 02/03/21 1012     02/03/21 0800  vancomycin (VANCOCIN) IVPB 1000 mg/200 mL premix  Status:  Discontinued        1,000 mg 200 mL/hr over 60 Minutes Intravenous Every 24 hours 02/02/21 0713 02/03/21 1012   02/02/21 1445  metroNIDAZOLE (FLAGYL) IVPB 500 mg        500 mg 100 mL/hr over 60 Minutes Intravenous Every 12 hours 02/02/21 1431     02/02/21 1445  cefTRIAXone (ROCEPHIN)  2 g in sodium chloride 0.9 % 100 mL IVPB        2 g 200 mL/hr over 30 Minutes Intravenous Every 24 hours 02/02/21 1431     02/02/21 1430  ceFEPIme (MAXIPIME) 2 g in sodium chloride 0.9 % 100 mL IVPB  Status:  Discontinued        2 g 200 mL/hr over 30 Minutes Intravenous Every 8 hours 02/02/21 1428 02/02/21 1431   02/02/21 0530  vancomycin (VANCOREADY) IVPB 1500 mg/300 mL        1,500 mg 150 mL/hr over 120 Minutes Intravenous  Once 02/02/21 0528 02/02/21 0838       Objective:  Vital Signs  Vitals:   02/04/21 0000 02/04/21 0404 02/04/21 0724 02/04/21 1151  BP: 92/77 (!) 86/65 113/62 (!) 115/58  Pulse: 78 80 75 74  Resp: 14 18 18 18   Temp:  97.9 F (36.6 C) 98.1 F (36.7 C) 98 F (36.7 C)  TempSrc:  Oral Oral Oral  SpO2: 98% 94% 100% 99%  Weight:      Height:        Intake/Output Summary (Last 24 hours) at 02/04/2021 1225 Last data filed at 02/03/2021 1741 Gross per 24 hour  Intake 199.23 ml  Output --  Net 199.23 ml    [employer]   02/02/21  0527  Weight: 90.7 kg    General appearance: Awake alert.  In no distress Resp: Clear to auscultation bilaterally.  Normal effort Cardio: S1-S2 is normal regular.  No S3-S4.  No rubs murmurs or bruit GI: Erythema over the abdominal wall has resolved.  Nontender nondistended.  Bowel sounds present normal.  No masses organomegaly.   Extremities: Mild lower extremity edema noted. Neurologic: Alert and oriented x3.  No focal neurological deficits.     Lab Results:  Data Reviewed: I have personally reviewed labs and imaging study reports  CBC: Recent Labs  Lab 02/01/21 1656 02/02/21 2052 02/03/21 0321 02/04/21 0053  WBC 7.6  --  4.9 4.7  HGB 13.4 12.6 11.2* 10.7*  HCT 40.2 40.2 33.9* 33.2*  MCV 99.8  --  102.4* 100.6*  PLT 123*  --  96* 88*     Basic Metabolic Panel: Recent Labs  Lab 02/01/21 1656 02/02/21 1038 02/03/21 0321 02/04/21 0053  NA 135 137 136 136  K 3.4* 3.5 3.8 3.5  CL 93* 98 102 103  CO2 34*  28 24 25   GLUCOSE 110* 93 130* 117*  BUN 11 12 12 13   CREATININE 1.27* 1.18* 1.30* 1.31*  CALCIUM 8.5* 7.7* 7.9* 8.0*  MG  --  1.8  --  1.9     GFR: Estimated Creatinine Clearance: 43.9 mL/min (A) (by C-G formula based on SCr of 1.31 mg/dL (H)).  Liver Function Tests: Recent Labs  Lab 02/01/21 1656 02/02/21 1038 02/03/21 0321 02/04/21 0053  AST 38 37 29 29  ALT 22 19 17 19   ALKPHOS 123 92 90 89  BILITOT 1.6* 1.3* 0.9 0.6  PROT 6.6 5.4* 5.2* 5.5*  ALBUMIN 2.2* 2.2* 2.1* 2.0*     Recent Labs  Lab 02/01/21 1656  LIPASE 28    Recent Labs  Lab 02/02/21 1038  AMMONIA 33     Coagulation Profile: Recent Labs  Lab 02/02/21 0422  INR 1.5*      Recent Results (from the past 240 hour(s))  Urine Culture     Status: Abnormal   Collection Time: 02/01/21 11:37 PM   Specimen: Urine, Clean Catch  Result Value Ref Range Status   Specimen Description URINE, CLEAN CATCH  Final   Special Requests NONE  Final   Culture (A)  Final    <10,000 COLONIES/mL INSIGNIFICANT GROWTH Performed at [facility], [place] [address], [place], [place] [address]    Report Status 02/03/2021 FINAL  Final  Resp Panel by RT-PCR (Flu A&B, Covid) Nasopharyngeal Swab     Status: None   Collection Time: 02/02/21  4:18 AM   Specimen: Nasopharyngeal Swab; Nasopharyngeal(NP) swabs in vial transport medium  Result Value Ref Range Status   SARS Coronavirus 2 by RT PCR NEGATIVE NEGATIVE Final    Comment: (NOTE) SARS-CoV-2 target nucleic acids are NOT DETECTED.  The SARS-CoV-2 RNA is generally detectable in upper respiratory specimens during the acute phase of infection. The lowest concentration of SARS-CoV-2 viral copies this assay can detect is 138 copies/mL. A negative result does not preclude SARS-Cov-2 infection and should not be used as the sole basis for treatment or other patient management decisions. A negative result may occur with  improper specimen collection/handling, submission  of specimen other than nasopharyngeal swab, presence of viral mutation(s) within the areas targeted by this assay, and inadequate number of viral copies(<138 copies/mL). A negative result must be combined with clinical observations, patient history, and epidemiological information. The expected result is Negative.  Fact Sheet for Patients:  EntrepreneurPulse.com.au  Fact Sheet for Healthcare Providers:  IncredibleEmployment.be  This test is no t yet approved or cleared by the [place] FDA and  has been authorized for detection and/or diagnosis of SARS-CoV-2 by FDA under an Emergency Use Authorization (EUA). This EUA will remain  in effect (meaning this test can be used) for the duration of the COVID-19 declaration under Section 564(b)(1) of the Act, 21 U.S.C.section 360bbb-3(b)(1), unless the authorization is terminated  or revoked sooner.       Influenza A by PCR NEGATIVE NEGATIVE Final   Influenza B by PCR NEGATIVE NEGATIVE Final    Comment: (NOTE) The Xpert Xpress SARS-CoV-2/FLU/RSV plus assay is intended as an aid in the diagnosis of influenza from Nasopharyngeal swab specimens and should not be used as a sole basis for treatment. Nasal washings and aspirates are unacceptable for Xpert Xpress SARS-CoV-2/FLU/RSV testing.  Fact Sheet for Patients: EntrepreneurPulse.com.au  Fact Sheet for Healthcare Providers: IncredibleEmployment.be  This test is not yet approved or cleared by the [place] FDA and has been authorized for detection and/or diagnosis of SARS-CoV-2 by FDA under an Emergency Use Authorization (EUA). This EUA will remain in effect (meaning this test can be used) for the duration of the COVID-19 declaration under Section 564(b)(1) of the Act, 21 U.S.C. section 360bbb-3(b)(1), unless the authorization is terminated or revoked.  Performed at [facility], [place] [address],  [place], [place] [address]   Culture, blood (single) w Reflex to ID Panel     Status: None (Preliminary result)   Collection Time: 02/02/21  4:50 AM   Specimen: BLOOD  Result Value Ref Range Status   Specimen Description BLOOD RIGHT HAND  Final   Special Requests   Final    BOTTLES DRAWN AEROBIC AND ANAEROBIC Blood Culture results may not be optimal due to an inadequate volume of blood received in culture bottles   Culture   Final    NO GROWTH 2 DAYS Performed at [facility], [place] [address] [place],  [address]    Report Status PENDING  Incomplete       Radiology Studies: IR ABDOMEN US LIMITED  Result Date: 02/02/2021 CLINICAL DATA:  Abdominal distension, assess for ascites EXAM: LIMITED ABDOMEN ULTRASOUND FOR ASCITES TECHNIQUE: Limited ultrasound survey for ascites was performed in all four abdominal quadrants. COMPARISON:  02/01/2021 CT FINDINGS: Ultrasound of the abdominal 4 quadrants demonstrates no significant abdominopelvic ascites that warrants paracentesis. Also no significant subcutaneous umbilical fluid collection that warrants aspiration. IMPRESSION: No significant abdominopelvic ascites.  Paracentesis not performed Electronically Signed   By: Jerilynn Mages.  Shick M.D.   On: 02/02/2021 16:12       LOS: 2 days   [facility] Pager on [email or web address]  02/04/2021, 12:25 PM

## 2021-02-04 NOTE — Telephone Encounter (Signed)
-----   Message from Sharyn Creamer, MD sent at 02/02/2021  6:11 PM EST ----- Hi Randol Zumstein, could you please help arrange GI clinic follow up in 1 month with me or an APP for heme positive stools and possible cirrhosis?  Thanks, Lyndee Leo

## 2021-02-04 NOTE — TOC Initial Note (Signed)
Transition of Care [facility]) - Initial/Assessment Note    Patient Details  Name: Carla Leonard MRN: [id number] Date of Birth: 21-Aug-1955  Transition of Care [facility]) CM/SW Contact:    Cyndi Bender, RN Phone Number: 02/04/2021, 4:22 PM  Clinical Narrative:               Spoke to patient regarding transition needs. Patient lives in apartment alone is [place]. Her friend lives in the apartment next door. She is wanting to move back to [place].  She is unable to drive at this time due to seizures.  Therapy recommends HH-PT/OT orders placed. Also orders for rolling walker and tub bench. Patient agreeable to have DME delivered to the room from [employer]. Left message with Jasmine at  [employer] to order DME. Patient defers to [facility] to find [facility] agency. Spoke to [employer] with [place] and referral accepted. TOC  to continue to follow for needs.    Expected Discharge Plan: [place] Barriers to Discharge: Continued Medical Work up   Patient Goals and CMS Choice Patient states their goals for this hospitalization and ongoing recovery are:: return home CMS Medicare.gov Compare Post Acute Care list provided to:: Patient Choice offered to / list presented to : Patient  Expected Discharge Plan and Services Expected Discharge Plan: [place]   Discharge Planning Services: CM Consult Post Acute Care Choice: Durable Medical Equipment, Home Health Living arrangements for the past 2 months: Apartment                 DME Arranged: Gilford Rile, Other see comment (tub bench) DME Agency: [employer]   Time DME Agency Contacted: [phone] Representative spoke with at DME Agency: jasmine HH Arranged: PT, OT HH Agency: [place] Date [place]: 02/04/21 Time [place]: 1618 Representative spoke with at [place]: [place] Arrangements/Services Living arrangements for the past 2 months: [place] with:: Self Patient  language and need for interpreter reviewed:: Yes Do you feel safe going back to the place where you live?: Yes      Need for Family Participation in Patient Care: Yes (Comment) Care giver support system in place?: Yes (comment)   Criminal Activity/Legal Involvement Pertinent to Current Situation/Hospitalization: No - Comment as needed  Activities of Daily Living Home Assistive Devices/Equipment: Dentures (specify type), Eyeglasses ADL Screening (condition at time of admission) Patient's cognitive ability adequate to safely complete daily activities?: Yes Is the patient deaf or have difficulty hearing?: No Does the patient have difficulty seeing, even when wearing glasses/contacts?: No Does the patient have difficulty concentrating, remembering, or making decisions?: No Patient able to express need for assistance with ADLs?: Yes Does the patient have difficulty dressing or bathing?: No Independently performs ADLs?: Yes (appropriate for developmental age) Does the patient have difficulty walking or climbing stairs?: Yes Weakness of Legs: Both Weakness of Arms/Hands: None  Permission Sought/Granted   Permission granted to share information with : Yes, Verbal Permission Granted     Permission granted to share info w AGENCY: home health        Emotional Assessment   Attitude/Demeanor/Rapport: Engaged Affect (typically observed): Accepting Orientation: : Oriented to Situation, Oriented to  Time, Oriented to Place, Oriented to Self      Admission diagnosis:  Cellulitis and abscess of trunk [L03.319, L02.219] Abscess [L02.91] Other ascites [R18.8] Cutaneous abscess of abdominal wall [L02.211] Hypotension [I95.9] Patient Active Problem List   Diagnosis Date Noted   Cellulitis  of abdominal wall 02/03/2021   Heme positive stool 02/03/2021   Ascites 02/03/2021   History of alcohol abuse 02/03/2021   Seizure disorder ([place]) 02/03/2021   Hypotension 02/02/2021   Lactic acidosis  02/02/2021   CKD (chronic kidney disease), stage III ([place]) 02/02/2021   Hyperbilirubinemia 02/02/2021   Hypoalbuminemia 02/02/2021   Thrombocytopenia ([place]) 02/02/2021   Hematoma 06/15/2015   Essential hypertension 06/15/2015   Acute kidney injury ([place]) 06/15/2015   Anxiety and depression 06/15/2015   Cellulitis of right lower extremity    Hypokalemia    Renal insufficiency    Cellulitis 06/14/2015   Cellulitis of right leg 06/14/2015   Osteoarthritis of left knee 06/17/2011   Ganglion of right wrist 06/17/2011   PCP:  Cher Nakai, MD Pharmacy:   [id number] [place], [address] [place] [address] Phone: [phone] Fax: [place], [place] - [address] [place] [address] Phone: [phone] Fax: [phone]     Social Determinants of Health (SDOH) Interventions    Readmission Risk Interventions No flowsheet data found.

## 2021-02-04 NOTE — Telephone Encounter (Signed)
Per Dr. Libby Maw request, pt has been scheduled for hosp f/u on 03/03/21 @ 830am. Appt reminder has been mailed. Appt will reflect on AVS upon hosp discharge for pt future reference.

## 2021-02-04 NOTE — Progress Notes (Signed)
Patient ID: Carla Leonard, female   DOB: 03-04-1955, 66 y.o.   MRN: [id number] [facility] Progress Note     Subjective: CC-  No new complaints. States she feels better. Denies abd pain. Denies any drainage from umbilicus since admission.  AFVSS, WBC WNL  Objective: Vital signs in last 24 hours: Temp:  [97.8 F (36.6 C)-98.1 F (36.7 C)] 98 F (36.7 C) (02/02 1151) Pulse Rate:  [73-80] 74 (02/02 1151) Resp:  [14-18] 18 (02/02 1151) BP: (86-124)/(56-77) 115/58 (02/02 1151) SpO2:  [94 %-100 %] 99 % (02/02 1151) Last BM Date: 02/03/21  Intake/Output from previous day: 02/01 0701 - 02/02 0700 In: 199.2 [IV Piggyback:199.2] Out: -  Intake/Output this shift: No intake/output data recorded.  PE: Gen:  Alert, NAD Abd: well healed open RUQ incision, obese, soft, NT, +BS, no masses, hernias, or organomegaly. Cellulitis improved, non-tender.  Lab Results:  Recent Labs    02/03/21 0321 02/04/21 0053  WBC 4.9 4.7  HGB 11.2* 10.7*  HCT 33.9* 33.2*  PLT 96* 88*   BMET Recent Labs    02/03/21 0321 02/04/21 0053  NA 136 136  K 3.8 3.5  CL 102 103  CO2 24 25  GLUCOSE 130* 117*  BUN 12 13  CREATININE 1.30* 1.31*  CALCIUM 7.9* 8.0*   PT/INR Recent Labs    02/02/21 0422  LABPROT 18.0*  INR 1.5*   CMP     Component Value Date/Time   NA 136 02/04/2021 0053   K 3.5 02/04/2021 0053   CL 103 02/04/2021 0053   CO2 25 02/04/2021 0053   GLUCOSE 117 (H) 02/04/2021 0053   BUN 13 02/04/2021 0053   CREATININE 1.31 (H) 02/04/2021 0053   CALCIUM 8.0 (L) 02/04/2021 0053   PROT 5.5 (L) 02/04/2021 0053   ALBUMIN 2.0 (L) 02/04/2021 0053   AST 29 02/04/2021 0053   ALT 19 02/04/2021 0053   ALKPHOS 89 02/04/2021 0053   BILITOT 0.6 02/04/2021 0053   GFRNONAA 45 (L) 02/04/2021 0053   GFRAA 55 (L) 06/17/2015 0526   Lipase     Component Value Date/Time   LIPASE 28 02/01/2021 1656       Studies/Results: IR ABDOMEN US LIMITED  Result Date:  02/02/2021 CLINICAL DATA:  Abdominal distension, assess for ascites EXAM: LIMITED ABDOMEN ULTRASOUND FOR ASCITES TECHNIQUE: Limited ultrasound survey for ascites was performed in all four abdominal quadrants. COMPARISON:  02/01/2021 CT FINDINGS: Ultrasound of the abdominal 4 quadrants demonstrates no significant abdominopelvic ascites that warrants paracentesis. Also no significant subcutaneous umbilical fluid collection that warrants aspiration. IMPRESSION: No significant abdominopelvic ascites.  Paracentesis not performed Electronically Signed   By: Jerilynn Mages.  Shick M.D.   On: 02/02/2021 16:12    Anti-infectives: Anti-infectives (From admission, onward)    Start     Dose/Rate Route Frequency Ordered Stop   02/03/21 1100  vancomycin (VANCOREADY) IVPB 750 mg/150 mL        750 mg 150 mL/hr over 60 Minutes Intravenous Every 24 hours 02/03/21 1012     02/03/21 0800  vancomycin (VANCOCIN) IVPB 1000 mg/200 mL premix  Status:  Discontinued        1,000 mg 200 mL/hr over 60 Minutes Intravenous Every 24 hours 02/02/21 0713 02/03/21 1012   02/02/21 1445  metroNIDAZOLE (FLAGYL) IVPB 500 mg        500 mg 100 mL/hr over 60 Minutes Intravenous Every 12 hours 02/02/21 1431     02/02/21 1445  cefTRIAXone (ROCEPHIN) 2 g in sodium chloride  0.9 % 100 mL IVPB        2 g 200 mL/hr over 30 Minutes Intravenous Every 24 hours 02/02/21 1431     02/02/21 1430  ceFEPIme (MAXIPIME) 2 g in sodium chloride 0.9 % 100 mL IVPB  Status:  Discontinued        2 g 200 mL/hr over 30 Minutes Intravenous Every 8 hours 02/02/21 1428 02/02/21 1431   02/02/21 0530  vancomycin (VANCOREADY) IVPB 1500 mg/300 mL        1,500 mg 150 mL/hr over 120 Minutes Intravenous  Once 02/02/21 0528 02/02/21 6144        Assessment/Plan Subcutaneous periumbilical abscess with cellulitis - CT scan shows a small fluid collection (3.1 x 0.9 x 2.2cm) deep to the umbilicus with cellulitis.  - spontaneously drained PTA, no further drainage, and no  induration or fluctuance on exam - WBC WNL, afebrile - cellulitis improving, no indication for I&D. Ok to transition to oral abx from a surgical perspective. CCS will sign off. Call as needed.   ID - vancomycin/ rocephin/ flagyl VTE - SCDs FEN - NPO Foley - none   Hypotension Hypoalbuminemia Coagulopathy Thrombocytopenia Ascites FOBT+ HTN HLD CKD Hypothyroidism Seizures Anxiety/ depression Obesity BMI 37.79  Moderate Medical Decision Making   LOS: 2 days    Jill Alexanders, [facility] 02/04/2021, 11:55 AM Please see Amion for pager number during day hours 7:00am-4:30pm

## 2021-02-05 DIAGNOSIS — E2740 Unspecified adrenocortical insufficiency: Secondary | ICD-10-CM | POA: Diagnosis present

## 2021-02-05 LAB — COMPREHENSIVE METABOLIC PANEL
ALT: 18 U/L (ref 0–44)
AST: 27 U/L (ref 15–41)
Albumin: 1.9 g/dL — ABNORMAL LOW (ref 3.5–5.0)
Alkaline Phosphatase: 90 U/L (ref 38–126)
Anion gap: 7 (ref 5–15)
BUN: 14 mg/dL (ref 8–23)
CO2: 22 mmol/L (ref 22–32)
Calcium: 7.8 mg/dL — ABNORMAL LOW (ref 8.9–10.3)
Chloride: 104 mmol/L (ref 98–111)
Creatinine, Ser: 1.12 mg/dL — ABNORMAL HIGH (ref 0.44–1.00)
GFR, Estimated: 55 mL/min — ABNORMAL LOW (ref >60)
Glucose, Bld: 118 mg/dL — ABNORMAL HIGH (ref 70–99)
Potassium: 4 mmol/L (ref 3.5–5.1)
Sodium: 133 mmol/L — ABNORMAL LOW (ref 135–145)
Total Bilirubin: 1.1 mg/dL (ref 0.3–1.2)
Total Protein: 5.2 g/dL — ABNORMAL LOW (ref 6.5–8.1)

## 2021-02-05 LAB — CBC
HCT: 29.6 % — ABNORMAL LOW (ref 36.0–46.0)
Hemoglobin: 9.7 g/dL — ABNORMAL LOW (ref 12.0–15.0)
MCH: 32.9 pg (ref 26.0–34.0)
MCHC: 32.8 g/dL (ref 30.0–36.0)
MCV: 100.3 fL — ABNORMAL HIGH (ref 80.0–100.0)
Platelets: 83 10*3/uL — ABNORMAL LOW (ref 150–400)
RBC: 2.95 MIL/uL — ABNORMAL LOW (ref 3.87–5.11)
RDW: 15.6 % — ABNORMAL HIGH (ref 11.5–15.5)
WBC: 4.6 10*3/uL (ref 4.0–10.5)
nRBC: 0 % (ref 0.0–0.2)

## 2021-02-05 LAB — ACTH STIMULATION, 3 TIME POINTS
Cortisol, 30 Min: 8.9 ug/dL
Cortisol, 60 Min: 12.8 ug/dL
Cortisol, Base: 0.6 ug/dL

## 2021-02-05 MED ORDER — HYDROCORTISONE 5 MG PO TABS: ORAL_TABLET | ORAL | 2 refills | Status: DC

## 2021-02-05 MED ORDER — HYDROCORTISONE 5 MG PO TABS
5.0000 mg | ORAL_TABLET | Freq: Every evening | ORAL | Status: DC
  Filled 2021-02-05: qty 1

## 2021-02-05 MED ORDER — HYDROCORTISONE 10 MG PO TABS
10.0000 mg | ORAL_TABLET | Freq: Every day | ORAL | Status: DC
  Administered 2021-02-05: 10 mg via ORAL
  Filled 2021-02-05: qty 1

## 2021-02-05 MED ORDER — AMOXICILLIN-POT CLAVULANATE 875-125 MG PO TABS: 1.0000 | ORAL_TABLET | Freq: Two times a day (BID) | ORAL | 0 refills | Status: DC

## 2021-02-05 NOTE — Discharge Summary (Signed)
[facility]  Physician Discharge Summary   Patient ID: Carla Leonard MRN: [id number] DOB/AGE: [birth year] 66 y.o.  Admit date: 02/01/2021 Discharge date: 02/05/2021    PCP: Cher Nakai, MD  DISCHARGE DIAGNOSES:  Principal Problem:   Cellulitis of abdominal wall Active Problems:   Hypotension   Adrenal insufficiency (HCC)   Heme positive stool   Lactic acidosis   Ascites   Hypokalemia   Thrombocytopenia (HCC)   CKD (chronic kidney disease), stage III (HCC)   Hyperbilirubinemia   History of alcohol abuse   Anxiety and depression   Seizure disorder ([name])   Hypoalbuminemia   RECOMMENDATIONS FOR OUTPATIENT FOLLOW UP: Ambulatory referral sent to endocrinology for further evaluation of adrenal insufficiency Follow-up with PCP in 1 week Gastroenterology to arrange outpatient follow-up   Home Health: PT and OT Equipment/Devices: Rolling walker  CODE STATUS: Full code  DISCHARGE CONDITION: fair  Diet recommendation: As before  INITIAL HISTORY: 66 y.o. female with medical history significant of hypertension, hypothyroidism, nephrolithiasis, thrombocytopenia, seizures, anxiety, depression who presented for evaluation of drainage from her bellybutton.  Patient states that for the last 3 months she has been having abdominal swelling.  She had been placed on Lasix and spironolactone and had been taking the medications as advised.  However here over the last couple weeks she had noted drainage coming from her bellybutton.  She was evaluated initially in the [facility] emergency department.  There was concern for cellulitis.  She was given oral antibiotic prescriptions.  Symptoms did not improve so she decided to present here.  Concern raised for cellulitis of the abdominal wall.  Initially there was concern for abscess.  Surgery was consulted.  Placed on broad-spectrum antibiotics.  Hospitalized for further management.    Consultations: General  surgery Gastroenterology Critical care medicine   HOSPITAL COURSE:   * Cellulitis of abdominal wall- (present on admission) Patient presented with complaints of drainage from wound of her abdomen.  CT scan of the abdomen and notes anterior wall stranding with 3.1 x 0.9 x 2.2 cm fluid collection present.    Patient initially started on vancomycin.   General surgery was consulted.  Antibiotic regimen was changed over to vancomycin, ceftriaxone and metronidazole.  No significant fluid collection was noted when interventional radiology was consulted for possible drainage.   Abdominal cellulitis appears to have improved.  Patient was changed over to oral Augmentin.  Feels much better this morning.  Seems to be okay for discharge.  We will continue antibiotics for few more days.  Adrenal insufficiency ([name])- (present on admission) Cortisol level noted to be low.  Cosyntropin stimulation test done which showed a suboptimal response.  Quite likely patient has adrenal insufficiency.  Has not been on steroids in the last year but has taken steroids previously for various issues.  Extremities CT scan did not show any lesions in the adrenal glands.  Unfortunately ACTH level was not sent.  This will need to be pursued in the outpatient setting.  In the meantime we will initiate the patient on hydrocortisone 10 mg in the morning and 5 mg in the evening.  This was discussed in detail with patient.  Ambulatory referral sent to endocrinology.  Hypotension- (present on admission) Initial blood pressures were as low as 40 systolic.  She was noted to be on metoprolol spironolactone and furosemide at home.  She was given fluid bolus as well as albumin.  Antihypertensive medications were held. Blood pressure has improved.  Cortisol level was noted to  be low.  Please see under adrenal insufficiency.  Heme positive stool- (present on admission) Seen by gastroenterology.  Plan is to do outpatient evaluation.  No evidence  of overt bleeding.  Hemoglobin stable for the most part.  Slight drop is likely dilutional.  Lactic acidosis- (present on admission) Likely due to low blood pressures.  Improved with fluid boluses.  Ascites- (present on admission) History of alcohol abuse Hyperbilirubinemia  Patient with history of alcohol abuse.  Concern is for some degree of alcoholic liver disease.  No significant ascites noted for drainage.  Seen by gastroenterology.  Outpatient follow-up.  Bilirubin is noted to be stable. As per patient she has not had any alcoholic beverages in over 2 years.  Hypokalemia- (present on admission) Supplemented.  Thrombocytopenia ([name])- (present on admission) Coagulopathy  Likely related to history of alcoholism and unspecified liver disease.  Will need outpatient evaluation.  CKD (chronic kidney disease), stage III ([name])- (present on admission) Chronic kidney disease stage IIIa  Renal function stable for the most part.    Seizure disorder ([name]) Noted to be on Keppra which is being continued.  She mentions that she was diagnosed with seizures back in October.  Based on Care Everywhere it looks like she was diagnosed in October 2021.  She had a MRI of the head with and without contrast.  Results are available in care everywhere.  No significant abnormalities were noted.  Recommend follow-up with outpatient providers.  Continue Keppra.  Anxiety and depression- (present on admission) Xanax as needed.   Obesity Estimated body mass index is 37.79 kg/m as calculated from the following:   Height as of this encounter: 5\' 1"  (1.549 m).   Weight as of this encounter: 90.7 kg.  Patient feels well.  Wants to go home today.  Okay for discharge.    PERTINENT LABS:  The results of significant diagnostics from this hospitalization (including imaging, microbiology, ancillary and laboratory) are listed below for reference.    Microbiology: Recent Results (from the past 240 hour(s))   Urine Culture     Status: Abnormal   Collection Time: 02/01/21 11:37 PM   Specimen: Urine, Clean Catch  Result Value Ref Range Status   Specimen Description URINE, CLEAN CATCH  Final   Special Requests NONE  Final   Culture (A)  Final    <10,000 COLONIES/mL INSIGNIFICANT GROWTH Performed at [facility], [address], [place], [place] [address]    Report Status 02/03/2021 FINAL  Final  Resp Panel by RT-PCR (Flu A&B, Covid) Nasopharyngeal Swab     Status: None   Collection Time: 02/02/21  4:18 AM   Specimen: Nasopharyngeal Swab; Nasopharyngeal(NP) swabs in vial transport medium  Result Value Ref Range Status   SARS Coronavirus 2 by RT PCR NEGATIVE NEGATIVE Final    Comment: (NOTE) SARS-CoV-2 target nucleic acids are NOT DETECTED.  The SARS-CoV-2 RNA is generally detectable in upper respiratory specimens during the acute phase of infection. The lowest concentration of SARS-CoV-2 viral copies this assay can detect is 138 copies/mL. A negative result does not preclude SARS-Cov-2 infection and should not be used as the sole basis for treatment or other patient management decisions. A negative result may occur with  improper specimen collection/handling, submission of specimen other than nasopharyngeal swab, presence of viral mutation(s) within the areas targeted by this assay, and inadequate number of viral copies(<138 copies/mL). A negative result must be combined with clinical observations, patient history, and epidemiological information. The expected result is Negative.  Fact Sheet for Patients:  EntrepreneurPulse.com.au  Fact Sheet for Healthcare Providers:  IncredibleEmployment.be  This test is no t yet approved or cleared by the [place] FDA and  has been authorized for detection and/or diagnosis of SARS-CoV-2 by FDA under an Emergency Use Authorization (EUA). This EUA will remain  in effect (meaning this test can be used)  for the duration of the COVID-19 declaration under Section 564(b)(1) of the Act, 21 U.S.C.section 360bbb-3(b)(1), unless the authorization is terminated  or revoked sooner.       Influenza A by PCR NEGATIVE NEGATIVE Final   Influenza B by PCR NEGATIVE NEGATIVE Final    Comment: (NOTE) The Xpert Xpress SARS-CoV-2/FLU/RSV plus assay is intended as an aid in the diagnosis of influenza from Nasopharyngeal swab specimens and should not be used as a sole basis for treatment. Nasal washings and aspirates are unacceptable for Xpert Xpress SARS-CoV-2/FLU/RSV testing.  Fact Sheet for Patients: EntrepreneurPulse.com.au  Fact Sheet for Healthcare Providers: IncredibleEmployment.be  This test is not yet approved or cleared by the [place] FDA and has been authorized for detection and/or diagnosis of SARS-CoV-2 by FDA under an Emergency Use Authorization (EUA). This EUA will remain in effect (meaning this test can be used) for the duration of the COVID-19 declaration under Section 564(b)(1) of the Act, 21 U.S.C. section 360bbb-3(b)(1), unless the authorization is terminated or revoked.  Performed at [facility], [place] [address], [place], [place] [address]   Culture, blood (single) w Reflex to ID Panel     Status: None (Preliminary result)   Collection Time: 02/02/21  4:50 AM   Specimen: BLOOD  Result Value Ref Range Status   Specimen Description BLOOD RIGHT HAND  Final   Special Requests   Final    BOTTLES DRAWN AEROBIC AND ANAEROBIC Blood Culture results may not be optimal due to an inadequate volume of blood received in culture bottles   Culture   Final    NO GROWTH 4 DAYS Performed at [facility], [place] [address] [place], [place] [address]    Report Status PENDING  Incomplete     Labs:  COVID-19 Labs   Lab Results  Component Value Date   [place] NEGATIVE 02/02/2021      Basic Metabolic Panel: Recent Labs  Lab  02/01/21 1656 02/02/21 1038 02/03/21 0321 02/04/21 0053 02/05/21 0619  NA 135 137 136 136 133*  K 3.4* 3.5 3.8 3.5 4.0  CL 93* 98 102 103 104  CO2 34* 28 24 25 22   GLUCOSE 110* 93 130* 117* 118*  BUN 11 12 12 13 14   CREATININE 1.27* 1.18* 1.30* 1.31* 1.12*  CALCIUM 8.5* 7.7* 7.9* 8.0* 7.8*  MG  --  1.8  --  1.9  --    Liver Function Tests: Recent Labs  Lab 02/01/21 1656 02/02/21 1038 02/03/21 0321 02/04/21 0053 02/05/21 0619  AST 38 37 29 29 27   ALT 22 19 17 19 18   ALKPHOS 123 92 90 89 90  BILITOT 1.6* 1.3* 0.9 0.6 1.1  PROT 6.6 5.4* 5.2* 5.5* 5.2*  ALBUMIN 2.2* 2.2* 2.1* 2.0* 1.9*   Recent Labs  Lab 02/01/21 1656  LIPASE 28   Recent Labs  Lab 02/02/21 1038  AMMONIA 33   CBC: Recent Labs  Lab 02/01/21 1656 02/02/21 2052 02/03/21 0321 02/04/21 0053 02/05/21 0619  WBC 7.6  --  4.9 4.7 4.6  HGB 13.4 12.6 11.2* 10.7* 9.7*  HCT 40.2 40.2 33.9* 33.2* 29.6*  MCV 99.8  --  102.4* 100.6* 100.3*  PLT 123*  --  96* 88* 83*    BNP: BNP (last 3 results) Recent Labs    11/13/20 1836  BNP 213.7*     IMAGING STUDIES CT ABDOMEN PELVIS W CONTRAST  Result Date: 02/01/2021 CLINICAL DATA:  Acute abdominal pain. Wound drainage below belly button. EXAM: CT ABDOMEN AND PELVIS WITH CONTRAST TECHNIQUE: Multidetector CT imaging of the abdomen and pelvis was performed using the standard protocol following bolus administration of intravenous contrast. RADIATION DOSE REDUCTION: This exam was performed according to the departmental dose-optimization program which includes automated exposure control, adjustment of the mA and/or kV according to patient size and/or use of iterative reconstruction technique. CONTRAST:  111mL OMNIPAQUE IOHEXOL 300 MG/ML  SOLN COMPARISON:  CT abdomen and pelvis 01/08/2021. FINDINGS: Lower chest: No acute abnormality. Hepatobiliary: Patient is status post cholecystectomy. There is mild intra and extrahepatic biliary ductal dilatation which appears  similar to the prior study. No focal liver lesions are identified. Pancreas: Unremarkable. No pancreatic ductal dilatation or surrounding inflammatory changes. Spleen: Mildly enlarged, unchanged. Adrenals/Urinary Tract: There are rounded hypodensities in both kidneys which are too small to characterize, likely cyst. Otherwise, the kidneys, adrenal glands and bladder are within normal limits. Stomach/Bowel: Stomach is within normal limits. Appendix appears normal. No evidence of bowel wall thickening, distention, or inflammatory changes. The appendix is not visualized. Vascular/Lymphatic: Aortic atherosclerosis. No enlarged abdominal or pelvic lymph nodes. Prominent pericardial lymph node measures 7 mm. Reproductive: Uterus and bilateral adnexa are unremarkable. Other: There is a small amount of free fluid in the pelvis. Ascites has significantly decreased. Minimal fluid tracking along the bilateral pericolic gutters. Central mesenteric haziness. No focal body wall edema. There is anterior body wall subcutaneous stranding and skin thickening. There is a small enhancing fluid collection in the subcutaneous tissues the level of the inferior umbilicus measuring 3.1 by 0.9 x 2.2 cm. No evidence for soft tissue gas. Musculoskeletal: Multilevel degenerative changes affect the spine. IMPRESSION: 1. Small enhancing fluid collections in the subcutaneous tissues at the level of the umbilicus measuring 3.1 x 0.9 x 2.2 cm. Abscess not excluded. No evidence for soft tissue gas. 2. Anterior abdominal wall subcutaneous stranding and skin thickening may be related to cellulitis. 3. Significant decrease in ascites.  Mild ascites persists. 4. Stable splenomegaly. 5.  Aortic Atherosclerosis (ICD10-I70.0). Electronically Signed   By: Ronney Asters M.D.   On: 02/01/2021 20:34   IR ABDOMEN US LIMITED  Result Date: 02/02/2021 CLINICAL DATA:  Abdominal distension, assess for ascites EXAM: LIMITED ABDOMEN ULTRASOUND FOR ASCITES  TECHNIQUE: Limited ultrasound survey for ascites was performed in all four abdominal quadrants. COMPARISON:  02/01/2021 CT FINDINGS: Ultrasound of the abdominal 4 quadrants demonstrates no significant abdominopelvic ascites that warrants paracentesis. Also no significant subcutaneous umbilical fluid collection that warrants aspiration. IMPRESSION: No significant abdominopelvic ascites.  Paracentesis not performed Electronically Signed   By: Jerilynn Mages.  Shick M.D.   On: 02/02/2021 16:12    DISCHARGE EXAMINATION: Vitals:   02/04/21 1602 02/04/21 2007 02/05/21 0400 02/05/21 0854  BP: (!) 118/54  (!) 108/54   Pulse: 84  86   Resp: 15  18   Temp: 98.2 F (36.8 C)  98.4 F (36.9 C)   TempSrc: Oral  Oral   SpO2: 98% 96% 92% 98%  Weight:      Height:       General appearance: Awake alert.  In no distress Resp: Clear to auscultation bilaterally.  Normal effort  Cardio: S1-S2 is normal regular.  No S3-S4.  No rubs murmurs or bruit GI: Abdomen is soft.  Improved erythema over the abdominal wall.   DISPOSITION: Home  Discharge Instructions     Ambulatory referral to Endocrinology   Complete by: As directed    For adrenal insufficiency. In 3-4 weeks or earliest available.   Call MD for:  difficulty breathing, headache or visual disturbances   Complete by: As directed    Call MD for:  extreme fatigue   Complete by: As directed    Call MD for:  persistant dizziness or light-headedness   Complete by: As directed    Call MD for:  persistant nausea and vomiting   Complete by: As directed    Call MD for:  severe uncontrolled pain   Complete by: As directed    Call MD for:  temperature >100.4   Complete by: As directed    Diet - low sodium heart healthy   Complete by: As directed    Discharge instructions   Complete by: As directed    Please take your medications as prescribed.  Please be sure to follow-up with primary care provider in 1 to 2 weeks.  A referral has been sent to endocrinology regarding  your adrenal/low steroid issues as discussed in detail earlier today.  Please have your primary care provider follow-up on this referral. Since your blood pressure is low we have stopped all the medicines which can lower your blood pressure including fluid medicines.  These may be resumed if your blood pressure is improves in the next week or so  You were cared for by a hospitalist during your hospital stay. If you have any questions about your discharge medications or the care you received while you were in the hospital after you are discharged, you can call the unit and asked to speak with the hospitalist on call if the hospitalist that took care of you is not available. Once you are discharged, your primary care physician will handle any further medical issues. Please note that NO REFILLS for any discharge medications will be authorized once you are discharged, as it is imperative that you return to your primary care physician (or establish a relationship with a primary care physician if you do not have one) for your aftercare needs so that they can reassess your need for medications and monitor your lab values. If you do not have a primary care physician, you can call [phone] for a physician referral.   Increase activity slowly   Complete by: As directed           Allergies as of 02/05/2021       Reactions   Sulfa Antibiotics Anaphylaxis   Prednisone Other (See Comments)   hallucinations   Nsaids Other (See Comments)   Stomach pain, diarrhea   Other Other (See Comments)   Steroids: hallucinations        Medication List     STOP taking these medications    cephALEXin 500 MG capsule Commonly known as: KEFLEX   doxycycline 100 MG tablet Commonly known as: VIBRA-TABS   furosemide 20 MG tablet Commonly known as: LASIX   metoprolol succinate 50 MG 24 hr tablet Commonly known as: TOPROL-XL   spironolactone 50 MG tablet Commonly known as: ALDACTONE       TAKE these  medications    ALPRAZolam 1 MG tablet Commonly known as: XANAX Take 0.5 mg by mouth See admin instructions. Take 1/2 tablet (0.5 mg)  by mouth every morning and at bedtime (with trazodone); may also take 1/2 tablet (0.5 mg) twice daily as needed for anxiety   amoxicillin-clavulanate 875-125 MG tablet Commonly known as: AUGMENTIN Take 1 tablet by mouth every 12 (twelve) hours for 7 days.   budesonide-formoterol 160-4.5 MCG/ACT inhaler Commonly known as: SYMBICORT Inhale 2 puffs into the lungs 2 (two) times daily as needed (shortness of breath/wheezing).   hydrocortisone 5 MG tablet Commonly known as: CORTEF Take 2 tablets (10mg ) every morning and take 1 tablet (5mg ) every night.   levETIRAcetam 500 MG tablet Commonly known as: KEPPRA Take 500 mg by mouth 2 (two) times daily.   primidone 50 MG tablet Commonly known as: MYSOLINE Take 100 mg by mouth every morning.   promethazine 12.5 MG tablet Commonly known as: PHENERGAN Take 12.5 mg by mouth 2 (two) times daily as needed for nausea or vomiting.   traZODone 100 MG tablet Commonly known as: DESYREL Take 100 mg by mouth at bedtime. Take with 0.5 mg alprazolam          Follow-up Information     Hague, Rosalyn Charters, MD In 2 days.   Specialty: Internal Medicine Contact information: [address] [place] [address] [phone]                 TOTAL DISCHARGE TIME: 66 minutes  [place]  [facility] Pager on [email or web address]  02/06/2021, 11:01 AM

## 2021-02-05 NOTE — Progress Notes (Signed)
Occupational Therapy Treatment Patient Details Name: Carla Leonard MRN: [id number] DOB: [birth year] Today's Date: 02/05/2021   History of present illness 66 y.o. female who presented with drainage from her bellybutton adn abdominal swelling. Admitted for cellulitis. Pt medical history significant for tobacco abuse, possible COPD, HTN, HLD, CKD IIIa, seizure like activity, and anxiety/ depression/ bipolar   OT comments  Pt making good progress for all ADL's and functional mobility. She was up ambulating in room, packing with no RW on OT arrival. OT provided supervision to min A for all dressing and toileting ADL's, as well as supervision-min guard for mobility. Continue to recommend [place].    Recommendations for follow up therapy are one component of a multi-disciplinary discharge planning process, led by the attending physician.  Recommendations may be updated based on patient status, additional functional criteria and insurance authorization.    Follow Up Recommendations  Home health OT    Assistance Recommended at Discharge Intermittent Supervision/Assistance  Patient can return home with the following  A little help with walking and/or transfers;A little help with bathing/dressing/bathroom;Assistance with cooking/housework;Assist for transportation   Equipment Recommendations  Tub/shower bench    Recommendations for Other Services      Precautions / Restrictions Precautions Precautions: Fall Precaution Comments: back pain Restrictions Weight Bearing Restrictions: No       Mobility Bed Mobility               General bed mobility comments: Up walking in room on arrival    Transfers Overall transfer level: Needs assistance Equipment used: Rolling walker (2 wheels) Transfers: Sit to/from Stand Sit to Stand: Supervision                 Balance Overall balance assessment: Needs assistance Sitting-balance support: No upper extremity supported, Feet  supported Sitting balance-Leahy Scale: Fair     Standing balance support: Single extremity supported Standing balance-Leahy Scale: Fair Standing balance comment: ambulating in room with no RW. statically leans on 1 UE for support                           ADL either performed or assessed with clinical judgement   ADL Overall ADL's : Needs assistance/impaired     Grooming: Min guard;Standing           Upper Body Dressing : Supervision/safety;Sitting   Lower Body Dressing: Minimal assistance;Sitting/lateral leans;Sit to/from stand   Toilet Transfer: Min guard;Ambulation   Toileting- Clothing Manipulation and Hygiene: Min guard;Sit to/from stand       Functional mobility during ADLs: Min guard General ADL Comments: Pt up w/out RW this session. Assisted pt in dressing, grooming, and packing up her room to discharge. Overall requiring min guard to min A for all ADL's and functional mobility.    Extremity/Trunk Assessment              Vision       Perception     Praxis      Cognition Arousal/Alertness: Awake/alert Behavior During Therapy: WFL for tasks assessed/performed, Anxious Overall Cognitive Status: No family/caregiver present to determine baseline cognitive functioning                                 General Comments: tangential and forgetting what she is talking about in mid conversation. requires repetition to questions due to distractibility. Requires redirectable cueing to stay on task. A&Ox 4  Exercises      Shoulder Instructions       General Comments VSS on RA.    Pertinent Vitals/ Pain       Pain Assessment Pain Assessment: Faces Faces Pain Scale: Hurts a little bit Pain Location: back R>L Pain Descriptors / Indicators: Discomfort, Grimacing Pain Intervention(s): Monitored during session  Home Living                                          Prior Functioning/Environment               Frequency  Min 2X/week        Progress Toward Goals  OT Goals(current goals can now be found in the care plan section)  Progress towards OT goals: Progressing toward goals  Acute Rehab OT Goals Patient Stated Goal: To go home OT Goal Formulation: With patient Time For Goal Achievement: 02/17/21 Potential to Achieve Goals: Good ADL Goals Pt Will Perform Lower Body Dressing: with modified independence;sit to/from stand Pt Will Transfer to Toilet: with modified independence;ambulating Pt Will Perform Tub/Shower Transfer: Tub transfer;with supervision;tub bench Pt/caregiver will Perform Home Exercise Program: Increased ROM;Both right and left upper extremity;With theraband;With written HEP provided  Plan Discharge plan remains appropriate;Frequency remains appropriate    Co-evaluation                 AM-PAC OT "6 Clicks" Daily Activity     Outcome Measure   Help from another person eating meals?: None Help from another person taking care of personal grooming?: A Little Help from another person toileting, which includes using toliet, bedpan, or urinal?: A Little Help from another person bathing (including washing, rinsing, drying)?: A Lot Help from another person to put on and taking off regular upper body clothing?: A Little Help from another person to put on and taking off regular lower body clothing?: A Lot 6 Click Score: 17    End of Session Equipment Utilized During Treatment: Gait belt  OT Visit Diagnosis: Other abnormalities of gait and mobility (R26.89);Muscle weakness (generalized) (M62.81);History of falling (Z91.81)   Activity Tolerance Patient tolerated treatment well   Patient Left Other (comment) (In transport chair, waiting to be discharged.)   Nurse Communication Mobility status        Time: 3545-6256 OT Time Calculation (min): 11 min  Charges: OT General Charges $OT Visit: 1 Visit OT Treatments $Self Care/Home Management : 8-22  mins  Carla Leonard H., OTR/L Acute Rehabilitation  Carla Leonard Carla Leonard 02/05/2021, 7:34 PM

## 2021-02-05 NOTE — Care Management Important Message (Signed)
Important Message  Patient Details  Name: Carla Leonard MRN: [id number] Date of Birth: 11/17/1955   Medicare Important Message Given:  Yes     Orbie Pyo 02/05/2021, 2:21 PM

## 2021-02-06 NOTE — Assessment & Plan Note (Signed)
Cortisol level noted to be low.  Cosyntropin stimulation test done which showed a suboptimal response.  Quite likely patient has adrenal insufficiency.  Has not been on steroids in the last year but has taken steroids previously for various issues.  Extremities CT scan did not show any lesions in the adrenal glands.  Unfortunately ACTH level was not sent.  This will need to be pursued in the outpatient setting.  In the meantime we will initiate the patient on hydrocortisone 10 mg in the morning and 5 mg in the evening.  This was discussed in detail with patient.  Ambulatory referral sent to endocrinology.

## 2021-02-07 LAB — CULTURE, BLOOD (SINGLE): Culture: NO GROWTH

## 2021-02-10 ENCOUNTER — Encounter (HOSPITAL_COMMUNITY): Payer: Self-pay

## 2021-02-10 ENCOUNTER — Emergency Department (HOSPITAL_COMMUNITY): Payer: Medicare HMO

## 2021-02-10 ENCOUNTER — Emergency Department (HOSPITAL_COMMUNITY)
Admission: EM | Admit: 2021-02-10 | Discharge: 2021-02-11 | Disposition: A | Discharge status: Home / Self Care | Payer: Medicare HMO | Source: Home / Self Care | Attending: Emergency Medicine | Admitting: Emergency Medicine

## 2021-02-10 ENCOUNTER — Other Ambulatory Visit: Payer: Self-pay

## 2021-02-10 DIAGNOSIS — F419 Anxiety disorder, unspecified: Secondary | ICD-10-CM | POA: Diagnosis not present

## 2021-02-10 DIAGNOSIS — S3992XA Unspecified injury of lower back, initial encounter: Secondary | ICD-10-CM | POA: Diagnosis not present

## 2021-02-10 DIAGNOSIS — K7460 Unspecified cirrhosis of liver: Secondary | ICD-10-CM | POA: Diagnosis not present

## 2021-02-10 DIAGNOSIS — G40909 Epilepsy, unspecified, not intractable, without status epilepticus: Secondary | ICD-10-CM | POA: Diagnosis not present

## 2021-02-10 DIAGNOSIS — M25561 Pain in right knee: Secondary | ICD-10-CM | POA: Diagnosis not present

## 2021-02-10 DIAGNOSIS — M5440 Lumbago with sciatica, unspecified side: Secondary | ICD-10-CM | POA: Insufficient documentation

## 2021-02-10 DIAGNOSIS — Z96653 Presence of artificial knee joint, bilateral: Secondary | ICD-10-CM | POA: Insufficient documentation

## 2021-02-10 DIAGNOSIS — Z882 Allergy status to sulfonamides status: Secondary | ICD-10-CM | POA: Diagnosis not present

## 2021-02-10 DIAGNOSIS — S82145A Nondisplaced bicondylar fracture of left tibia, initial encounter for closed fracture: Secondary | ICD-10-CM | POA: Diagnosis not present

## 2021-02-10 DIAGNOSIS — Z743 Need for continuous supervision: Secondary | ICD-10-CM | POA: Diagnosis not present

## 2021-02-10 DIAGNOSIS — S72422A Displaced fracture of lateral condyle of left femur, initial encounter for closed fracture: Secondary | ICD-10-CM | POA: Diagnosis not present

## 2021-02-10 DIAGNOSIS — R9431 Abnormal electrocardiogram [ECG] [EKG]: Secondary | ICD-10-CM | POA: Diagnosis not present

## 2021-02-10 DIAGNOSIS — Z886 Allergy status to analgesic agent status: Secondary | ICD-10-CM | POA: Diagnosis not present

## 2021-02-10 DIAGNOSIS — M25562 Pain in left knee: Secondary | ICD-10-CM | POA: Diagnosis not present

## 2021-02-10 DIAGNOSIS — I10 Essential (primary) hypertension: Secondary | ICD-10-CM | POA: Insufficient documentation

## 2021-02-10 DIAGNOSIS — F17210 Nicotine dependence, cigarettes, uncomplicated: Secondary | ICD-10-CM | POA: Diagnosis present

## 2021-02-10 DIAGNOSIS — M9702XA Periprosthetic fracture around internal prosthetic left hip joint, initial encounter: Secondary | ICD-10-CM | POA: Diagnosis not present

## 2021-02-10 DIAGNOSIS — Z96652 Presence of left artificial knee joint: Secondary | ICD-10-CM | POA: Diagnosis not present

## 2021-02-10 DIAGNOSIS — F1010 Alcohol abuse, uncomplicated: Secondary | ICD-10-CM | POA: Diagnosis present

## 2021-02-10 DIAGNOSIS — M9712XA Periprosthetic fracture around internal prosthetic left knee joint, initial encounter: Secondary | ICD-10-CM | POA: Insufficient documentation

## 2021-02-10 DIAGNOSIS — Z888 Allergy status to other drugs, medicaments and biological substances status: Secondary | ICD-10-CM | POA: Diagnosis not present

## 2021-02-10 DIAGNOSIS — R609 Edema, unspecified: Secondary | ICD-10-CM | POA: Diagnosis not present

## 2021-02-10 DIAGNOSIS — M48061 Spinal stenosis, lumbar region without neurogenic claudication: Secondary | ICD-10-CM | POA: Diagnosis not present

## 2021-02-10 DIAGNOSIS — W1830XA Fall on same level, unspecified, initial encounter: Secondary | ICD-10-CM | POA: Diagnosis present

## 2021-02-10 DIAGNOSIS — Z6841 Body Mass Index (BMI) 40.0 and over, adult: Secondary | ICD-10-CM | POA: Diagnosis not present

## 2021-02-10 DIAGNOSIS — Z635 Disruption of family by separation and divorce: Secondary | ICD-10-CM | POA: Diagnosis not present

## 2021-02-10 DIAGNOSIS — F32A Depression, unspecified: Secondary | ICD-10-CM | POA: Diagnosis not present

## 2021-02-10 DIAGNOSIS — S72432A Displaced fracture of medial condyle of left femur, initial encounter for closed fracture: Secondary | ICD-10-CM | POA: Diagnosis not present

## 2021-02-10 DIAGNOSIS — M9712XD Periprosthetic fracture around internal prosthetic left knee joint, subsequent encounter: Secondary | ICD-10-CM | POA: Diagnosis not present

## 2021-02-10 DIAGNOSIS — F411 Generalized anxiety disorder: Secondary | ICD-10-CM | POA: Diagnosis present

## 2021-02-10 DIAGNOSIS — Z20822 Contact with and (suspected) exposure to covid-19: Secondary | ICD-10-CM | POA: Insufficient documentation

## 2021-02-10 DIAGNOSIS — Y92002 Bathroom of unspecified non-institutional (private) residence single-family (private) house as the place of occurrence of the external cause: Secondary | ICD-10-CM | POA: Insufficient documentation

## 2021-02-10 DIAGNOSIS — Y92009 Unspecified place in unspecified non-institutional (private) residence as the place of occurrence of the external cause: Secondary | ICD-10-CM | POA: Diagnosis not present

## 2021-02-10 DIAGNOSIS — R531 Weakness: Secondary | ICD-10-CM | POA: Diagnosis not present

## 2021-02-10 DIAGNOSIS — M170 Bilateral primary osteoarthritis of knee: Secondary | ICD-10-CM | POA: Diagnosis present

## 2021-02-10 DIAGNOSIS — W19XXXA Unspecified fall, initial encounter: Secondary | ICD-10-CM | POA: Insufficient documentation

## 2021-02-10 DIAGNOSIS — E875 Hyperkalemia: Secondary | ICD-10-CM | POA: Diagnosis present

## 2021-02-10 DIAGNOSIS — Z96651 Presence of right artificial knee joint: Secondary | ICD-10-CM | POA: Diagnosis present

## 2021-02-10 DIAGNOSIS — M47816 Spondylosis without myelopathy or radiculopathy, lumbar region: Secondary | ICD-10-CM | POA: Diagnosis not present

## 2021-02-10 DIAGNOSIS — E861 Hypovolemia: Secondary | ICD-10-CM | POA: Diagnosis present

## 2021-02-10 DIAGNOSIS — E871 Hypo-osmolality and hyponatremia: Secondary | ICD-10-CM | POA: Diagnosis not present

## 2021-02-10 DIAGNOSIS — E2740 Unspecified adrenocortical insufficiency: Secondary | ICD-10-CM | POA: Diagnosis not present

## 2021-02-10 DIAGNOSIS — D649 Anemia, unspecified: Secondary | ICD-10-CM | POA: Diagnosis present

## 2021-02-10 DIAGNOSIS — K7031 Alcoholic cirrhosis of liver with ascites: Secondary | ICD-10-CM | POA: Diagnosis not present

## 2021-02-10 LAB — CBC WITH DIFFERENTIAL/PLATELET
Abs Immature Granulocytes: 0.03 10*3/uL (ref 0.00–0.07)
Basophils Absolute: 0 10*3/uL (ref 0.0–0.1)
Basophils Relative: 0 %
Eosinophils Absolute: 0 10*3/uL (ref 0.0–0.5)
Eosinophils Relative: 1 %
HCT: 33.7 % — ABNORMAL LOW (ref 36.0–46.0)
Hemoglobin: 10.7 g/dL — ABNORMAL LOW (ref 12.0–15.0)
Immature Granulocytes: 0 %
Lymphocytes Relative: 6 %
Lymphs Abs: 0.4 10*3/uL — ABNORMAL LOW (ref 0.7–4.0)
MCH: 32.8 pg (ref 26.0–34.0)
MCHC: 31.8 g/dL (ref 30.0–36.0)
MCV: 103.4 fL — ABNORMAL HIGH (ref 80.0–100.0)
Monocytes Absolute: 0.6 10*3/uL (ref 0.1–1.0)
Monocytes Relative: 9 %
Neutro Abs: 5.8 10*3/uL (ref 1.7–7.7)
Neutrophils Relative %: 84 %
Platelets: 113 10*3/uL — ABNORMAL LOW (ref 150–400)
RBC: 3.26 MIL/uL — ABNORMAL LOW (ref 3.87–5.11)
RDW: 16.2 % — ABNORMAL HIGH (ref 11.5–15.5)
WBC: 6.9 10*3/uL (ref 4.0–10.5)
nRBC: 0 % (ref 0.0–0.2)

## 2021-02-10 LAB — BASIC METABOLIC PANEL
Anion gap: 8 (ref 5–15)
BUN: 9 mg/dL (ref 8–23)
CO2: 24 mmol/L (ref 22–32)
Calcium: 9 mg/dL (ref 8.9–10.3)
Chloride: 105 mmol/L (ref 98–111)
Creatinine, Ser: 1.25 mg/dL — ABNORMAL HIGH (ref 0.44–1.00)
GFR, Estimated: 48 mL/min — ABNORMAL LOW (ref >60)
Glucose, Bld: 88 mg/dL (ref 70–99)
Potassium: 5.3 mmol/L — ABNORMAL HIGH (ref 3.5–5.1)
Sodium: 137 mmol/L (ref 135–145)

## 2021-02-10 LAB — RESP PANEL BY RT-PCR (FLU A&B, COVID) ARPGX2
Influenza A by PCR: NEGATIVE
Influenza B by PCR: NEGATIVE
SARS Coronavirus 2 by RT PCR: NEGATIVE

## 2021-02-10 MED ORDER — AMOXICILLIN-POT CLAVULANATE 875-125 MG PO TABS
1.0000 | ORAL_TABLET | Freq: Once | ORAL | Status: AC
  Administered 2021-02-10: 1 via ORAL
  Filled 2021-02-10: qty 1

## 2021-02-10 MED ORDER — LACTATED RINGERS IV BOLUS
1000.0000 mL | Freq: Once | INTRAVENOUS | Status: AC
  Administered 2021-02-10: 1000 mL via INTRAVENOUS

## 2021-02-10 MED ORDER — ONDANSETRON HCL 4 MG/2ML IJ SOLN
4.0000 mg | Freq: Once | INTRAMUSCULAR | Status: AC
  Administered 2021-02-10: 4 mg via INTRAVENOUS
  Filled 2021-02-10: qty 2

## 2021-02-10 MED ORDER — HYDROCODONE-ACETAMINOPHEN 5-325 MG PO TABS: 1.0000 | ORAL_TABLET | ORAL | 0 refills | Status: DC | PRN

## 2021-02-10 MED ORDER — HYDROCODONE-ACETAMINOPHEN 5-325 MG PO TABS
2.0000 | ORAL_TABLET | Freq: Once | ORAL | Status: AC
  Administered 2021-02-10: 2 via ORAL
  Filled 2021-02-10: qty 2

## 2021-02-10 MED ORDER — MORPHINE SULFATE (PF) 4 MG/ML IV SOLN
4.0000 mg | Freq: Once | INTRAVENOUS | Status: AC
  Administered 2021-02-10: 4 mg via INTRAVENOUS
  Filled 2021-02-10: qty 1

## 2021-02-10 NOTE — Discharge Instructions (Signed)
You were evaluated in the Emergency Department and after careful evaluation, we did not find any emergent condition requiring admission or further testing in the hospital.  Your exam/testing today was concerning for periprosthetic femur fracture that orthopedics will treat with immobilization and follow-up outpatient.  Please follow-up with [facility].  A prescription for Norco has been prescribed for pain control.  Recommend weightbearing to touch only on the left leg.  Follow-up with Dr. Berenice Primas in clinic.  Please return to the Emergency Department if you experience any worsening of your condition.  Thank you for allowing [place] to be a part of your care.

## 2021-02-10 NOTE — ED Provider Notes (Signed)
[facility] EMERGENCY DEPARTMENT Provider Note   CSN: [id number] Arrival date & time: 02/10/21  1516     History  Chief Complaint  Patient presents with   Fall   Leg Pain    Carla Leonard is a 66 y.o. female.  Carla Leonard is a 66 y.o. female with prior history of hypertension, arthritis, depression, and recent admission for abdominal wall cellulitis, who presents to the emergency department via EMS for evaluation after mechanical fall.  Patient reports that she was in the bathroom and went to stand up and her left knee gave out causing her to fall.  She reports that she hit both of her knees and heard a crack, has history of bilateral knee replacements with Dr. Berenice Primas.  Also reports some chronic low back and sciatic issues but worsening back pain after the fall.  She reports that she did not hit her head or have any loss of consciousness and denies neck or upper back pain.  She reports that she has some small skin tears to both of her forearms but otherwise denies other injuries from the fall.  She is not on anticoagulation.  The history is provided by the patient, the EMS personnel and medical records.      Home Medications Prior to Admission medications   Medication Sig Start Date End Date Taking? Authorizing Provider  ALPRAZolam Duanne Moron) 1 MG tablet Take 0.5 mg by mouth See admin instructions. Take 1/2 tablet (0.5 mg) by mouth every morning and at bedtime (with trazodone); may also take 1/2 tablet (0.5 mg) twice daily as needed for anxiety 01/25/21   [provider]  amoxicillin-clavulanate (AUGMENTIN) 875-125 MG tablet Take 1 tablet by mouth every 12 (twelve) hours for 7 days. 02/05/21 02/12/21  Bonnielee Haff, MD  budesonide-formoterol [facility]) 160-4.5 MCG/ACT inhaler Inhale 2 puffs into the lungs 2 (two) times daily as needed (shortness of breath/wheezing). 01/20/21   [provider]  hydrocortisone (CORTEF) 5 MG tablet Take 2 tablets  (10mg ) every morning and take 1 tablet (5mg ) every night. 02/05/21   Bonnielee Haff, MD  levETIRAcetam (KEPPRA) 500 MG tablet Take 500 mg by mouth 2 (two) times daily. 01/20/21   [provider]  primidone (MYSOLINE) 50 MG tablet Take 100 mg by mouth every morning. 01/21/21   [provider]  promethazine (PHENERGAN) 12.5 MG tablet Take 12.5 mg by mouth 2 (two) times daily as needed for nausea or vomiting. 01/20/21   [provider]  traZODone (DESYREL) 100 MG tablet Take 100 mg by mouth at bedtime. Take with 0.5 mg alprazolam 01/20/21   [provider]      Allergies    Sulfa antibiotics, Prednisone, Nsaids, and Other    Review of Systems   Review of Systems  Constitutional:  Negative for chills and fever.  Cardiovascular:  Negative for chest pain.  Gastrointestinal:  Negative for abdominal pain.  Musculoskeletal:  Positive for arthralgias and back pain. Negative for neck pain.  Skin:  Positive for wound.  Neurological:  Negative for numbness and headaches.   Physical Exam Updated Vital Signs BP 126/62    Pulse 83    Temp (!) 97.5 F (36.4 C) (Oral)    Resp 17    Ht 5\' 1"  (1.549 m)    Wt 90.7 kg    SpO2 100%    BMI 37.79 kg/m  Physical Exam Vitals and nursing note reviewed.  Constitutional:      General: She is not  in acute distress.    Appearance: Normal appearance. She is well-developed. She is not diaphoretic.  HENT:     Head: Normocephalic and atraumatic.     Comments: No hematoma, step-offs or evidence of head trauma. Eyes:     General:        Right eye: No discharge.        Left eye: No discharge.     Pupils: Pupils are equal, round, and reactive to light.  Neck:     Comments: No midline C-spine tenderness. Cardiovascular:     Rate and Rhythm: Normal rate and regular rhythm.     Pulses: Normal pulses.     Heart sounds: Normal heart sounds.  Pulmonary:     Effort: Pulmonary effort is normal. No respiratory distress.     Breath sounds:  Normal breath sounds. No wheezing or rales.     Comments: Respirations equal and unlabored, patient able to speak in full sentences, lungs clear to auscultation bilaterally  Abdominal:     General: Bowel sounds are normal. There is no distension.     Palpations: Abdomen is soft. There is no mass.     Tenderness: There is no abdominal tenderness. There is no guarding.     Comments: Abdomen soft, nondistended, nontender to palpation in all quadrants without guarding or peritoneal signs  Musculoskeletal:        General: Tenderness present. No deformity.     Cervical back: Neck supple. No tenderness.     Comments: Tenderness over lumbar spine without palpable step-off or deformity.  Tenderness over bilateral knees, worse on the left than right, prior surgical scars noted, distal pulses 2+, no tenderness at the hips or ankles. Chronic appearing bruising of bilateral forearms with 2 small skin tears with bleeding controlled, not large enough to warrant suture repair  Skin:    General: Skin is warm and dry.     Capillary Refill: Capillary refill takes less than 2 seconds.  Neurological:     Mental Status: She is alert and oriented to person, place, and time.     Coordination: Coordination normal.     Comments: Speech is clear, able to follow commands CN III-XII intact Normal strength in upper and lower extremities bilaterally including dorsiflexion and plantar flexion, strong and equal grip strength Sensation normal to light and sharp touch Moves extremities without ataxia, coordination intact  Psychiatric:        Mood and Affect: Mood normal.        Behavior: Behavior normal.    ED Results / Procedures / Treatments   Labs (all labs ordered are listed, but only abnormal results are displayed) Labs Reviewed  RESP PANEL BY RT-PCR (FLU A&B, COVID) [id number]  BASIC METABOLIC PANEL  CBC WITH DIFFERENTIAL/PLATELET    EKG None  Radiology DG Knee Complete 4 Views Left  Result Date:  02/10/2021 CLINICAL DATA:  Fall EXAM: LEFT KNEE - COMPLETE 4+ VIEW COMPARISON:  None. FINDINGS: Status post knee replacement. Acute nondisplaced periprosthetic fracture medial side of distal femur at the metaphysis. Positive for knee effusion. IMPRESSION: Knee replacement with acute nondisplaced periprosthetic distal femoral metaphyseal fracture. Electronically Signed   By: Donavan Foil M.D.   On: 02/10/2021 17:26   DG Knee Complete 4 Views Right  Result Date: 02/10/2021 CLINICAL DATA:  Mechanical fall EXAM: RIGHT KNEE - COMPLETE 4+ VIEW COMPARISON:  06/14/2015 FINDINGS: Status post knee replacement. No malalignment. No sizeable effusion. Questionable nondisplaced cortical fracture at the fibular head. IMPRESSION: 1. Post  knee replacement. Questionable cortical fracture at the fibular head, correlate for point tenderness. Electronically Signed   By: Donavan Foil M.D.   On: 02/10/2021 17:28    Procedures Procedures    Medications Ordered in ED Medications  ondansetron (ZOFRAN) injection 4 mg (has no administration in time range)  morphine (PF) 4 MG/ML injection 4 mg (has no administration in time range)    ED Course/ Medical Decision Making/ A&P                           Medical Decision Making  Carla Leonard is a 65 y.o. female presents to the ED for concern of fall with low back and bilateral knee pain, this involves an extensive number of treatment options, and is a complaint that carries with it a high risk of complications and morbidity.  The differential diagnosis includes periprosthetic fracture, soft tissue injury, disc herniation, compression fracture.   Additional history obtained:  Additional history obtained from chart review and EMS personnel External records from outside source obtained and reviewed including recent hospitalization for abdominal wall cellulitis   Lab Tests:  I Ordered basic labs which are pending at shift change.   Imaging Studies ordered:  I  ordered imaging studies including x-rays of bilateral knees I independently visualized and interpreted imaging which showed acute nondisplaced periprosthetic distal femur metaphyseal fracture of the left knee with possible fracture at the fibular head on the right knee. CTs of bilateral knee as well as CT of the lumbar spine pending at shift change I agree with the radiologist interpretation   Medicines ordered and prescription drug management:  I ordered medication including morphine and Zofran for treatment of pain Reevaluation of the patient after these medicines showed that the patient improved I have reviewed the patients home medicines and have made adjustments as needed    ED Course:  66 year old female presents After fall in the bathroom, no evidence of head injury, worsening of chronic low back pain and bilateral knee pain, with hx of total knee replacement bilaterally Left periprosthetic fracture with possible fx on the left CTs pending, as well as Lumbar CT. Basic labs ordered in anticipation of admission  At shift change care signed out to Dr. Armandina Gemma, who will follow up on pending imagine and labs and discuss with on call provider for [facility].         Final Clinical Impression(s) / ED Diagnoses Final diagnoses:  Periprosthetic fracture around internal prosthetic left knee joint, initial encounter  Fall, initial encounter    Rx / DC Orders ED Discharge Orders     None         Janet Berlin 02/10/21 Randol Kern    Regan Lemming, MD 02/10/21 2336    Regan Lemming, MD 02/10/21 2351

## 2021-02-10 NOTE — ED Triage Notes (Signed)
PT BIB [facility] from home after a mechanical fall. Pt complains of L knee pain. No LOC.  BP 98/72 P 80 98% RA 108 BG

## 2021-02-10 NOTE — Progress Notes (Signed)
Orthopedic Tech Progress Note Patient Details:  Carla Leonard 07-03-1955 [id number]  Ortho Devices Type of Ortho Device: Knee Immobilizer Ortho Device/Splint Location: LLE Ortho Device/Splint Interventions: Ordered, Adjustment, Application   Post Interventions Patient Tolerated: Well Instructions Provided: Adjustment of device, Care of device, Poper ambulation with device  Carla Leonard 02/10/2021, 11:08 PM

## 2021-02-11 ENCOUNTER — Other Ambulatory Visit: Payer: Self-pay

## 2021-02-11 ENCOUNTER — Inpatient Hospital Stay (HOSPITAL_COMMUNITY)
Admission: EM | Admit: 2021-02-11 | Discharge: 2021-02-15 | DRG: 534 | Disposition: A | Discharge status: Home / Self Care | Payer: Medicare HMO | Attending: Internal Medicine | Admitting: Internal Medicine

## 2021-02-11 ENCOUNTER — Encounter (HOSPITAL_COMMUNITY): Payer: Self-pay | Admitting: Emergency Medicine

## 2021-02-11 DIAGNOSIS — F17210 Nicotine dependence, cigarettes, uncomplicated: Secondary | ICD-10-CM | POA: Diagnosis present

## 2021-02-11 DIAGNOSIS — Z20822 Contact with and (suspected) exposure to covid-19: Secondary | ICD-10-CM | POA: Diagnosis present

## 2021-02-11 DIAGNOSIS — Z635 Disruption of family by separation and divorce: Secondary | ICD-10-CM | POA: Diagnosis not present

## 2021-02-11 DIAGNOSIS — F419 Anxiety disorder, unspecified: Secondary | ICD-10-CM

## 2021-02-11 DIAGNOSIS — E2740 Unspecified adrenocortical insufficiency: Secondary | ICD-10-CM | POA: Diagnosis present

## 2021-02-11 DIAGNOSIS — Z888 Allergy status to other drugs, medicaments and biological substances status: Secondary | ICD-10-CM

## 2021-02-11 DIAGNOSIS — Z886 Allergy status to analgesic agent status: Secondary | ICD-10-CM

## 2021-02-11 DIAGNOSIS — F32A Depression, unspecified: Secondary | ICD-10-CM | POA: Diagnosis present

## 2021-02-11 DIAGNOSIS — E871 Hypo-osmolality and hyponatremia: Secondary | ICD-10-CM | POA: Diagnosis present

## 2021-02-11 DIAGNOSIS — M170 Bilateral primary osteoarthritis of knee: Secondary | ICD-10-CM | POA: Diagnosis present

## 2021-02-11 DIAGNOSIS — K7031 Alcoholic cirrhosis of liver with ascites: Secondary | ICD-10-CM | POA: Diagnosis present

## 2021-02-11 DIAGNOSIS — W19XXXA Unspecified fall, initial encounter: Secondary | ICD-10-CM | POA: Diagnosis not present

## 2021-02-11 DIAGNOSIS — E875 Hyperkalemia: Secondary | ICD-10-CM | POA: Diagnosis present

## 2021-02-11 DIAGNOSIS — S72432A Displaced fracture of medial condyle of left femur, initial encounter for closed fracture: Secondary | ICD-10-CM | POA: Diagnosis not present

## 2021-02-11 DIAGNOSIS — Z7951 Long term (current) use of inhaled steroids: Secondary | ICD-10-CM

## 2021-02-11 DIAGNOSIS — S72422A Displaced fracture of lateral condyle of left femur, initial encounter for closed fracture: Secondary | ICD-10-CM | POA: Diagnosis not present

## 2021-02-11 DIAGNOSIS — D649 Anemia, unspecified: Secondary | ICD-10-CM | POA: Diagnosis present

## 2021-02-11 DIAGNOSIS — F1010 Alcohol abuse, uncomplicated: Secondary | ICD-10-CM | POA: Diagnosis present

## 2021-02-11 DIAGNOSIS — I10 Essential (primary) hypertension: Secondary | ICD-10-CM | POA: Diagnosis present

## 2021-02-11 DIAGNOSIS — R531 Weakness: Secondary | ICD-10-CM | POA: Diagnosis not present

## 2021-02-11 DIAGNOSIS — Y92009 Unspecified place in unspecified non-institutional (private) residence as the place of occurrence of the external cause: Secondary | ICD-10-CM | POA: Diagnosis not present

## 2021-02-11 DIAGNOSIS — E861 Hypovolemia: Secondary | ICD-10-CM | POA: Diagnosis present

## 2021-02-11 DIAGNOSIS — Z79899 Other long term (current) drug therapy: Secondary | ICD-10-CM

## 2021-02-11 DIAGNOSIS — Z96651 Presence of right artificial knee joint: Secondary | ICD-10-CM | POA: Diagnosis present

## 2021-02-11 DIAGNOSIS — W1830XA Fall on same level, unspecified, initial encounter: Secondary | ICD-10-CM | POA: Diagnosis present

## 2021-02-11 DIAGNOSIS — F411 Generalized anxiety disorder: Secondary | ICD-10-CM | POA: Diagnosis present

## 2021-02-11 DIAGNOSIS — M9712XA Periprosthetic fracture around internal prosthetic left knee joint, initial encounter: Secondary | ICD-10-CM | POA: Diagnosis present

## 2021-02-11 DIAGNOSIS — R9431 Abnormal electrocardiogram [ECG] [EKG]: Secondary | ICD-10-CM | POA: Diagnosis not present

## 2021-02-11 DIAGNOSIS — Z882 Allergy status to sulfonamides status: Secondary | ICD-10-CM

## 2021-02-11 DIAGNOSIS — Z6841 Body Mass Index (BMI) 40.0 and over, adult: Secondary | ICD-10-CM | POA: Diagnosis not present

## 2021-02-11 DIAGNOSIS — M1990 Unspecified osteoarthritis, unspecified site: Secondary | ICD-10-CM | POA: Diagnosis present

## 2021-02-11 DIAGNOSIS — G40909 Epilepsy, unspecified, not intractable, without status epilepticus: Secondary | ICD-10-CM

## 2021-02-11 DIAGNOSIS — M9712XD Periprosthetic fracture around internal prosthetic left knee joint, subsequent encounter: Secondary | ICD-10-CM | POA: Diagnosis not present

## 2021-02-11 LAB — BASIC METABOLIC PANEL
Anion gap: 8 (ref 5–15)
BUN: 13 mg/dL (ref 8–23)
CO2: 22 mmol/L (ref 22–32)
Calcium: 9 mg/dL (ref 8.9–10.3)
Chloride: 102 mmol/L (ref 98–111)
Creatinine, Ser: 1.37 mg/dL — ABNORMAL HIGH (ref 0.44–1.00)
GFR, Estimated: 43 mL/min — ABNORMAL LOW (ref >60)
Glucose, Bld: 103 mg/dL — ABNORMAL HIGH (ref 70–99)
Potassium: 5.2 mmol/L — ABNORMAL HIGH (ref 3.5–5.1)
Sodium: 132 mmol/L — ABNORMAL LOW (ref 135–145)

## 2021-02-11 LAB — RESP PANEL BY RT-PCR (FLU A&B, COVID) ARPGX2
Influenza A by PCR: NEGATIVE
Influenza B by PCR: NEGATIVE
SARS Coronavirus 2 by RT PCR: NEGATIVE

## 2021-02-11 LAB — CBC
HCT: 36.1 % (ref 36.0–46.0)
Hemoglobin: 11.8 g/dL — ABNORMAL LOW (ref 12.0–15.0)
MCH: 33.7 pg (ref 26.0–34.0)
MCHC: 32.7 g/dL (ref 30.0–36.0)
MCV: 103.1 fL — ABNORMAL HIGH (ref 80.0–100.0)
Platelets: 122 10*3/uL — ABNORMAL LOW (ref 150–400)
RBC: 3.5 MIL/uL — ABNORMAL LOW (ref 3.87–5.11)
RDW: 16.3 % — ABNORMAL HIGH (ref 11.5–15.5)
WBC: 6.4 10*3/uL (ref 4.0–10.5)
nRBC: 0 % (ref 0.0–0.2)

## 2021-02-11 MED ORDER — FENTANYL CITRATE PF 50 MCG/ML IJ SOSY
50.0000 ug | PREFILLED_SYRINGE | INTRAMUSCULAR | Status: DC | PRN
  Administered 2021-02-12 (×2): 50 ug via INTRAVENOUS
  Filled 2021-02-11 (×2): qty 1

## 2021-02-11 MED ORDER — ACETAMINOPHEN 325 MG PO TABS: 650.0000 mg | ORAL_TABLET | Freq: Four times a day (QID) | ORAL | Status: DC | PRN

## 2021-02-11 MED ORDER — ACETAMINOPHEN 650 MG RE SUPP: 650.0000 mg | Freq: Four times a day (QID) | RECTAL | Status: DC | PRN

## 2021-02-11 MED ORDER — ONDANSETRON HCL 4 MG/2ML IJ SOLN: 4.0000 mg | Freq: Four times a day (QID) | INTRAMUSCULAR | Status: DC | PRN

## 2021-02-11 MED ORDER — HYDROCODONE-ACETAMINOPHEN 5-325 MG PO TABS
1.0000 | ORAL_TABLET | Freq: Once | ORAL | Status: AC
  Administered 2021-02-11: 1 via ORAL
  Filled 2021-02-11: qty 1

## 2021-02-11 MED ORDER — NALOXONE HCL 0.4 MG/ML IJ SOLN: 0.4000 mg | INTRAMUSCULAR | Status: DC | PRN

## 2021-02-11 NOTE — ED Provider Notes (Signed)
[facility] Provider Note   CSN: [id number] Arrival date & time: 02/11/21  1836     History  No chief complaint on file.   Carla Leonard is a 66 y.o. female.  66 year old female presents today for evaluation of left knee pain.  Patient was evaluated in this emergency room and diagnosed with nondisplaced fracture of left knee.  Patient has history of bilateral total knee replacement.  Patient reports after she left the emergency room she has not been able to ambulate or move.  Reports significant pain.  Denies fever, chills, or other complaints.  States has an appointment scheduled with orthopedics on the 14th.  The history is provided by the patient. No language interpreter was used.      Home Medications Prior to Admission medications   Medication Sig Start Date End Date Taking? Authorizing Provider  ALPRAZolam Duanne Moron) 1 MG tablet Take 0.5 mg by mouth See admin instructions. Take 1/2 tablet (0.5 mg) by mouth every morning and at bedtime (with trazodone); may also take 1/2 tablet (0.5 mg) twice daily as needed for anxiety 01/25/21   [provider]  amoxicillin-clavulanate (AUGMENTIN) 875-125 MG tablet Take 1 tablet by mouth every 12 (twelve) hours for 7 days. 02/05/21 02/12/21  Bonnielee Haff, MD  budesonide-formoterol [facility]) 160-4.5 MCG/ACT inhaler Inhale 2 puffs into the lungs 2 (two) times daily as needed (shortness of breath/wheezing). 01/20/21   [provider]  HYDROcodone-acetaminophen (NORCO/VICODIN) 5-325 MG tablet Take 1-2 tablets by mouth every 4 (four) hours as needed for severe pain. 02/10/21   Regan Lemming, MD  hydrocortisone (CORTEF) 5 MG tablet Take 2 tablets (10mg ) every morning and take 1 tablet (5mg ) every night. 02/05/21   Bonnielee Haff, MD  levETIRAcetam (KEPPRA) 500 MG tablet Take 500 mg by mouth 2 (two) times daily. 01/20/21   [provider]  primidone (MYSOLINE) 50 MG tablet Take 100 mg by mouth  every morning. 01/21/21   [provider]  promethazine (PHENERGAN) 12.5 MG tablet Take 12.5 mg by mouth 2 (two) times daily as needed for nausea or vomiting. 01/20/21   [provider]  traZODone (DESYREL) 100 MG tablet Take 100 mg by mouth at bedtime. Take with 0.5 mg alprazolam 01/20/21   [provider]      Allergies    Sulfa antibiotics, Prednisone, Nsaids, and Other    Review of Systems   Review of Systems  Constitutional:  Negative for chills and fever.  Respiratory:  Negative for shortness of breath.   Cardiovascular:  Negative for leg swelling.  Musculoskeletal:  Positive for arthralgias and gait problem.  Neurological:  Negative for weakness.  All other systems reviewed and are negative.  Physical Exam Updated Vital Signs BP (!) 152/99 (BP Location: Left Arm)    Pulse 84    Temp 98.3 F (36.8 C) (Oral)    Resp 18    SpO2 99%  Physical Exam Vitals and nursing note reviewed.  Constitutional:      General: She is not in acute distress.    Appearance: Normal appearance. She is not ill-appearing.  HENT:     Head: Normocephalic and atraumatic.     Nose: Nose normal.  Eyes:     Conjunctiva/sclera: Conjunctivae normal.  Pulmonary:     Effort: Pulmonary effort is normal. No respiratory distress.  Musculoskeletal:        General: No deformity.     Comments: Left knee immobilizer in place.  Removed to evaluate knee.  Knee without erythema, obvious joint swelling, or significant tenderness.  Range of motion limited secondary to pain.  Skin:    Findings: No rash.  Neurological:     Mental Status: She is alert.    ED Results / Procedures / Treatments   Labs (all labs ordered are listed, but only abnormal results are displayed) Labs Reviewed - No data to display  EKG None  Radiology CT Lumbar Spine Wo Contrast  Result Date: 02/10/2021 CLINICAL DATA:  Back trauma.  Fall EXAM: CT LUMBAR SPINE WITHOUT CONTRAST TECHNIQUE: Multidetector CT imaging of  the lumbar spine was performed without intravenous contrast administration. Multiplanar CT image reconstructions were also generated. RADIATION DOSE REDUCTION: This exam was performed according to the departmental dose-optimization program which includes automated exposure control, adjustment of the mA and/or kV according to patient size and/or use of iterative reconstruction technique. COMPARISON:  Lumbar MRI 11/26/2016 FINDINGS: Segmentation: 5 lumbar vertebra Alignment: Mild anterolisthesis L4-5 Vertebrae: Negative for fracture or mass in the lumbar spine. Lower thoracic spine included to the T11 level. Paraspinal and other soft tissues: Negative for paraspinous mass or adenopathy Ascites around the liver and in the pelvis. Small liver with nodular contour compatible with cirrhosis. Probable renal artery calcification in the left renal hilum. No hydronephrosis. Disc levels: T12-L1: Negative L1-2: Mild disc bulging without stenosis. L2-3: Disc degeneration with gas in the disc space. Diffuse disc bulging and bilateral facet degeneration. Mild subarticular stenosis bilaterally L3-4: Diffuse bulging of the disc and moderate facet degeneration. Chronic left foraminal disc protrusion unchanged from the prior study. Mild spinal stenosis. Moderate subarticular and foraminal stenosis on the left. L4-5: Mild anterolisthesis. Diffuse disc bulging and advanced facet degeneration. Moderate to severe spinal stenosis. Moderate to severe subarticular and foraminal stenosis bilaterally left greater than right L5-S1: Advanced facet degeneration. Moderate subarticular and foraminal stenosis bilaterally. IMPRESSION: 1. Negative for lumbar fracture 2. Multilevel lumbar degenerative change causing spinal and foraminal stenosis most severe at L4-5 3. Cirrhosis liver with ascites. Electronically Signed   By: Franchot Gallo M.D.   On: 02/10/2021 18:54   CT Knee Left Wo Contrast  Result Date: 02/10/2021 CLINICAL DATA:  Left knee pain  after fall. EXAM: CT OF THE LEFT KNEE WITHOUT CONTRAST TECHNIQUE: Multidetector CT imaging of the left knee was performed according to the standard protocol. Multiplanar CT image reconstructions were also generated. RADIATION DOSE REDUCTION: This exam was performed according to the departmental dose-optimization program which includes automated exposure control, adjustment of the mA and/or kV according to patient size and/or use of iterative reconstruction technique. COMPARISON:  Left knee x-rays from same day. FINDINGS: Bones/Joint/Cartilage Prior total knee arthroplasty. No evidence of hardware failure or loosening. Acute nondisplaced periprosthetic fracture of the distal femoral metaphysis (series 5, image 57). No dislocation. Small to moderate lipohemarthrosis. Osteopenia. Ligaments Ligaments are suboptimally evaluated by CT. Muscles and Tendons Grossly intact. Soft tissue No fluid collection or hematoma.  No soft tissue mass. IMPRESSION: 1. Prior total knee arthroplasty with acute nondisplaced periprosthetic fracture of the distal femoral metaphysis. 2. Small to moderate lipohemarthrosis. Electronically Signed   By: Titus Dubin M.D.   On: 02/10/2021 19:03   CT Knee Right Wo Contrast  Result Date: 02/10/2021 CLINICAL DATA:  Right knee pain after fall. EXAM: CT OF THE RIGHT KNEE WITHOUT CONTRAST TECHNIQUE: Multidetector CT imaging of the right knee was performed according to the standard protocol. Multiplanar CT image reconstructions were also generated. RADIATION DOSE REDUCTION: This exam was performed according to the departmental  dose-optimization program which includes automated exposure control, adjustment of the mA and/or kV according to patient size and/or use of iterative reconstruction technique. COMPARISON:  Right knee x-rays from same day. FINDINGS: Bones/Joint/Cartilage Prior total knee arthroplasty. No evidence of hardware failure or loosening. No fracture or dislocation. Osteopenia. Trace  joint effusion. Ligaments Ligaments are suboptimally evaluated by CT. Muscles and Tendons Grossly intact. Soft tissue No fluid collection or hematoma.  No soft tissue mass. IMPRESSION: 1. No acute osseous abnormality. 2. Prior total knee arthroplasty without evidence of hardware complication. Electronically Signed   By: Titus Dubin M.D.   On: 02/10/2021 18:58   DG Knee Complete 4 Views Left  Result Date: 02/10/2021 CLINICAL DATA:  Fall EXAM: LEFT KNEE - COMPLETE 4+ VIEW COMPARISON:  None. FINDINGS: Status post knee replacement. Acute nondisplaced periprosthetic fracture medial side of distal femur at the metaphysis. Positive for knee effusion. IMPRESSION: Knee replacement with acute nondisplaced periprosthetic distal femoral metaphyseal fracture. Electronically Signed   By: Donavan Foil M.D.   On: 02/10/2021 17:26   DG Knee Complete 4 Views Right  Result Date: 02/10/2021 CLINICAL DATA:  Mechanical fall EXAM: RIGHT KNEE - COMPLETE 4+ VIEW COMPARISON:  06/14/2015 FINDINGS: Status post knee replacement. No malalignment. No sizeable effusion. Questionable nondisplaced cortical fracture at the fibular head. IMPRESSION: 1. Post knee replacement. Questionable cortical fracture at the fibular head, correlate for point tenderness. Electronically Signed   By: Donavan Foil M.D.   On: 02/10/2021 17:28    Procedures Procedures    Medications Ordered in ED Medications  HYDROcodone-acetaminophen (NORCO/VICODIN) 5-325 MG per tablet 1 tablet (1 tablet Oral Given 02/11/21 1924)    ED Course/ Medical Decision Making/ A&P Clinical Course as of 02/11/21 2137  Thu Feb 11, 2021  1910 Patient discussed with Dr. Mable Fill with [facility] orthopedics who evaluated patient's imaging.  Given patient is not able to bear weight or ambulate in the emergency room following pain medication Dr. Mable Fill recommends placing patient in observation.  He recommends admission to medical service given her comorbidities. [AA]     Clinical Course User Index [AA] Evlyn Courier, PA-C                           Medical Decision Making Risk Prescription drug management. Decision regarding hospitalization.   66 year old female returns after she was evaluated last night for left knee pain.  Patient was diagnosed with periprosthetic left distal femur nondisplaced fracture.  Patient passed ambulatory trial yesterday however states since leaving she has not been able to ambulate and reports significant pain.  Patient was given a dose of Norco in the emergency room and attempted to ambulate patient.  Patient was unable to take 1 step with 2 person assist.  6 significant assistance to pivot and sit on recliner from bedside chair.  Case discussed with Dr. Mable Fill who recommended admission for obs and further orthopedic eval.  Recommended medical admission given comorbidities.  Case discussed with hospitalist who will evaluate patient for admission.  Patient is in agreement with plan.   Final Clinical Impression(s) / ED Diagnoses Final diagnoses:  Periprosthetic fracture around internal prosthetic left knee joint, subsequent encounter    Rx / DC Orders ED Discharge Orders     None         Evlyn Courier, PA-C 02/11/21 2304    Lacretia Leigh, MD 02/12/21 [phone]

## 2021-02-11 NOTE — ED Triage Notes (Signed)
Patient BIBA from home c/o knee pain after fall which occurred yesterday. She was dx with a left knee fracture and referred to ortho. Patient reports increasing pain since yesterday. VS WDL.

## 2021-02-11 NOTE — Progress Notes (Signed)
Patient discussed with EDP.  Patient presenting with bounce back to ED with left distal femur periprosthetic fracture above total knee at [facility] long.  She was seen yesterday in the ED and diagnosed with nondisplaced left periprosthetic femur fracture and initially recommended for nonoperative treatment and knee immobilizer with weightbearing as tolerated.  However, she has been totally nonambulatory and debilitated apparently since going home, and thus she returned to the emergency room.  Patient has multiple current apparently active medical problems with recent medical admission on 02/01/2021.  Patient's medical history and problem list includes seizures with most recent seizure this past October.  Review of her records indicate also recent cellulitis of abdominal wall, adrenal insufficiency, hypotension, stage 3 CKD, among other problems.  Patient may require surgery given failure of ambulatory trial with bounce back to hospital; however, will discuss plan with patient's surgeon.  We will plan to admit to medical service for observation overnight in light of multiple medical conditions, including history of seizures with recent seizure within the past 6 months.  Knee immobilizer on left lower extremity, should be centered at knee.  N.p.o. after midnight for possible OR tomorrow.  Will discuss patient with Dr. Berenice Primas, with further updates and recommendations to follow.  Georgeanna Harrison M.D. Orthopaedic Surgery [facility]

## 2021-02-12 ENCOUNTER — Encounter (HOSPITAL_COMMUNITY): Payer: Self-pay | Admitting: Internal Medicine

## 2021-02-12 DIAGNOSIS — Y92009 Unspecified place in unspecified non-institutional (private) residence as the place of occurrence of the external cause: Secondary | ICD-10-CM

## 2021-02-12 LAB — CBC WITH DIFFERENTIAL/PLATELET
Abs Immature Granulocytes: 0.02 10*3/uL (ref 0.00–0.07)
Basophils Absolute: 0 10*3/uL (ref 0.0–0.1)
Basophils Relative: 0 %
Eosinophils Absolute: 0.1 10*3/uL (ref 0.0–0.5)
Eosinophils Relative: 2 %
HCT: 32.6 % — ABNORMAL LOW (ref 36.0–46.0)
Hemoglobin: 10.7 g/dL — ABNORMAL LOW (ref 12.0–15.0)
Immature Granulocytes: 0 %
Lymphocytes Relative: 11 %
Lymphs Abs: 0.5 10*3/uL — ABNORMAL LOW (ref 0.7–4.0)
MCH: 33.6 pg (ref 26.0–34.0)
MCHC: 32.8 g/dL (ref 30.0–36.0)
MCV: 102.5 fL — ABNORMAL HIGH (ref 80.0–100.0)
Monocytes Absolute: 0.5 10*3/uL (ref 0.1–1.0)
Monocytes Relative: 9 %
Neutro Abs: 3.8 10*3/uL (ref 1.7–7.7)
Neutrophils Relative %: 78 %
Platelets: 135 10*3/uL — ABNORMAL LOW (ref 150–400)
RBC: 3.18 MIL/uL — ABNORMAL LOW (ref 3.87–5.11)
RDW: 16.2 % — ABNORMAL HIGH (ref 11.5–15.5)
WBC: 4.9 10*3/uL (ref 4.0–10.5)
nRBC: 0 % (ref 0.0–0.2)

## 2021-02-12 LAB — HIV ANTIBODY (ROUTINE TESTING W REFLEX): HIV Screen 4th Generation wRfx: NONREACTIVE

## 2021-02-12 LAB — COMPREHENSIVE METABOLIC PANEL
ALT: 36 U/L (ref 0–44)
AST: 59 U/L — ABNORMAL HIGH (ref 15–41)
Albumin: 2.9 g/dL — ABNORMAL LOW (ref 3.5–5.0)
Alkaline Phosphatase: 136 U/L — ABNORMAL HIGH (ref 38–126)
Anion gap: 6 (ref 5–15)
BUN: 13 mg/dL (ref 8–23)
CO2: 26 mmol/L (ref 22–32)
Calcium: 8.8 mg/dL — ABNORMAL LOW (ref 8.9–10.3)
Chloride: 102 mmol/L (ref 98–111)
Creatinine, Ser: 1.3 mg/dL — ABNORMAL HIGH (ref 0.44–1.00)
GFR, Estimated: 46 mL/min — ABNORMAL LOW (ref >60)
Glucose, Bld: 106 mg/dL — ABNORMAL HIGH (ref 70–99)
Potassium: 4.9 mmol/L (ref 3.5–5.1)
Sodium: 134 mmol/L — ABNORMAL LOW (ref 135–145)
Total Bilirubin: 1 mg/dL (ref 0.3–1.2)
Total Protein: 6.3 g/dL — ABNORMAL LOW (ref 6.5–8.1)

## 2021-02-12 LAB — MAGNESIUM: Magnesium: 2.3 mg/dL (ref 1.7–2.4)

## 2021-02-12 LAB — PROTIME-INR
INR: 1.4 — ABNORMAL HIGH (ref 0.8–1.2)
Prothrombin Time: 17.3 seconds — ABNORMAL HIGH (ref 11.4–15.2)

## 2021-02-12 MED ORDER — DULOXETINE HCL 20 MG PO CPEP
20.0000 mg | ORAL_CAPSULE | Freq: Every day | ORAL | Status: DC
  Administered 2021-02-12 – 2021-02-15 (×4): 20 mg via ORAL
  Filled 2021-02-12 (×4): qty 1

## 2021-02-12 MED ORDER — HYDROCORTISONE 5 MG PO TABS
5.0000 mg | ORAL_TABLET | Freq: Every day | ORAL | Status: DC
  Administered 2021-02-12 – 2021-02-14 (×3): 5 mg via ORAL
  Filled 2021-02-12 (×3): qty 1

## 2021-02-12 MED ORDER — LEVETIRACETAM 500 MG PO TABS
500.0000 mg | ORAL_TABLET | Freq: Two times a day (BID) | ORAL | Status: DC
  Administered 2021-02-12 – 2021-02-15 (×7): 500 mg via ORAL
  Filled 2021-02-12 (×7): qty 1

## 2021-02-12 MED ORDER — ALPRAZOLAM 0.5 MG PO TABS
0.5000 mg | ORAL_TABLET | Freq: Two times a day (BID) | ORAL | Status: DC | PRN
  Administered 2021-02-12: 0.5 mg via ORAL
  Filled 2021-02-12: qty 1

## 2021-02-12 MED ORDER — NICOTINE 14 MG/24HR TD PT24: 14.0000 mg | MEDICATED_PATCH | Freq: Every day | TRANSDERMAL | Status: DC | PRN

## 2021-02-12 MED ORDER — PRIMIDONE 50 MG PO TABS
100.0000 mg | ORAL_TABLET | Freq: Every morning | ORAL | Status: DC
  Administered 2021-02-12 – 2021-02-15 (×4): 100 mg via ORAL
  Filled 2021-02-12 (×4): qty 2

## 2021-02-12 MED ORDER — HYDROCODONE-ACETAMINOPHEN 5-325 MG PO TABS
1.0000 | ORAL_TABLET | ORAL | Status: DC | PRN
  Administered 2021-02-12: 2 via ORAL
  Administered 2021-02-13: 1 via ORAL
  Administered 2021-02-14 – 2021-02-15 (×2): 2 via ORAL
  Filled 2021-02-12: qty 2
  Filled 2021-02-12: qty 1
  Filled 2021-02-12 (×2): qty 2

## 2021-02-12 MED ORDER — HYDROCORTISONE 10 MG PO TABS
10.0000 mg | ORAL_TABLET | Freq: Every day | ORAL | Status: DC
  Administered 2021-02-12 – 2021-02-15 (×4): 10 mg via ORAL
  Filled 2021-02-12: qty 2
  Filled 2021-02-12 (×3): qty 1

## 2021-02-12 MED ORDER — ALPRAZOLAM 0.5 MG PO TABS
0.5000 mg | ORAL_TABLET | Freq: Two times a day (BID) | ORAL | Status: DC
  Administered 2021-02-13 – 2021-02-15 (×5): 0.5 mg via ORAL
  Filled 2021-02-12 (×5): qty 1

## 2021-02-12 NOTE — Progress Notes (Signed)
This patient underwent total knee replacement in 2013.I have not seen her in the office regarding her knees and 10 years.  She unfortunately fell 2 days ago and presented to the emergency room with a nondisplaced  medial condyle fracture around her total knee on the left side.  She was seen in emergency room and I evaluated her plain images and CT scan.  I spoke with the emergency room physician and told him that she could be discharged home if she were able to take care of herself and ambulate.  She was discharged home on 2/8.  As of 2/9 she was having difficulty with ambulation and mobilization.  She was brought back to the emergency room and admitted via the hospitalists.  She will need  skilled nursing home placement as she is unable to be taken care of at home for issues surrounding pain control and mobilization.  I spoke with Dr. Lennette Bihari Haddix and he reviewedher CAT scan and plain films.  He agrees that nonoperative treatment is the appropriate course of action.  He would be willing to undertake operative intervention if her fracture displaced but felt that that would be unlikely.  At this point she needs to have her knee immobilizer on at all times and in particular when walking.  She likely needs a visit from the [facility] to make sure that her knee immobilizer is appropriately positioned and centered on the knee and hitting her thigh and lower extremity as she has a significant mismatch and that is 2 sizes.  Dr. Georgeanna Harrison will do a formal consult on her today as I am out of town and I will see her in the office next week for followup x-rays.  That appointment is scheduled for 2/16.

## 2021-02-12 NOTE — Progress Notes (Signed)
[facility] PROGRESS NOTE    Progress Note  Carla Leonard  XIP:[id number] DOB: September 12, 1955 DOA: 02/11/2021 PCP: Merryl Hacker, No     Brief Narrative:   Carla Leonard is an 66 y.o. 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, seizure disorder generalized anxiety disorder and adrenal insufficiency recently seen in the hospital on 02/20/2021 for similar complaint was discharged home with a knee immobilizer and follow-up with orthopedic as an outpatient comes in for after a fall the day prior to admission and with new acute nondisplaced periprosthetic fracture of the left knee knee x-ray showed acute nondisplaced periprosthetic distal femoral metaphysis fracture    Assessment/Plan:   Closed bicondylar fracture of distal femur, left, initial encounter [facility]): Orthopedic surgery was consulted for operative management. Twelve-lead EKG no ischemic changes, INR 1.4. Anticoagulation and an analgesic per Orthopedic surgery.  Mechanical fall: Physical therapy Consulted for after procedure.  Hyperkalemia: Resolved with IV fluid hydration.  Hypovolemic hyponatremia: Improving with fluid resuscitation.  Alcoholic cirrhosis/ascites: Continue Aldactone, INR 1.4.  Continue metformin. Appears to be compensated.  Normocytic anemia: MCV mildly elevated likely due to alcohol abuse, hemoglobin appears to be at baseline.  Depression: Continue Cymbalta.  Essential hypertension: Aldactone was held due to hyperkalemia which is now improved.  Seizure disorder: Continue Keppra and primodone.  General anxiety disorder: Continue Cymbalta and Xanax as needed.  Tobacco abuse: Counseled.    DVT prophylaxis: lovenox Family Communication:none Status is: Inpatient Remains inpatient appropriate because: For acute surgical intervention for her left knee    Code Status:     Code Status Orders   (From admission, onward)           Start     Ordered   02/11/21 2205  Full code  Continuous        02/11/21 2204           Code Status History     Date Active Date Inactive Code Status Order ID Comments User Context   02/02/2021 0700 02/05/2021 2018 Full Code [id number]  Norval Morton, MD ED   06/14/2015 2235 06/17/2015 1824 Full Code [id number]  Norval Morton, MD ED         IV Access:   Peripheral IV   Procedures and diagnostic studies:   CT Lumbar Spine Wo Contrast  Result Date: 02/10/2021 CLINICAL DATA:  Back trauma.  Fall EXAM: CT LUMBAR SPINE WITHOUT CONTRAST TECHNIQUE: Multidetector CT imaging of the lumbar spine was performed without intravenous contrast administration. Multiplanar CT image reconstructions were also generated. RADIATION DOSE REDUCTION: This exam was performed according to the departmental dose-optimization program which includes automated exposure control, adjustment of the mA and/or kV according to patient size and/or use of iterative reconstruction technique. COMPARISON:  Lumbar MRI 11/26/2016 FINDINGS: Segmentation: 5 lumbar vertebra Alignment: Mild anterolisthesis L4-5 Vertebrae: Negative for fracture or mass in the lumbar spine. Lower thoracic spine included to the T11 level. Paraspinal and other soft tissues: Negative for paraspinous mass or adenopathy Ascites around the liver and in the pelvis. Small liver with nodular contour compatible with cirrhosis. Probable renal artery calcification in the left renal hilum. No hydronephrosis. Disc levels: T12-L1: Negative L1-2: Mild disc bulging without stenosis. L2-3: Disc degeneration with gas in the disc space. Diffuse disc bulging and bilateral facet degeneration. Mild subarticular stenosis bilaterally L3-4: Diffuse bulging of the disc and moderate facet degeneration. Chronic left foraminal disc protrusion unchanged from the prior study.  Mild spinal stenosis. Moderate subarticular and foraminal stenosis on  the left. L4-5: Mild anterolisthesis. Diffuse disc bulging and advanced facet degeneration. Moderate to severe spinal stenosis. Moderate to severe subarticular and foraminal stenosis bilaterally left greater than right L5-S1: Advanced facet degeneration. Moderate subarticular and foraminal stenosis bilaterally. IMPRESSION: 1. Negative for lumbar fracture 2. Multilevel lumbar degenerative change causing spinal and foraminal stenosis most severe at L4-5 3. Cirrhosis liver with ascites. Electronically Signed   By: Franchot Gallo M.D.   On: 02/10/2021 18:54   CT Knee Left Wo Contrast  Result Date: 02/10/2021 CLINICAL DATA:  Left knee pain after fall. EXAM: CT OF THE LEFT KNEE WITHOUT CONTRAST TECHNIQUE: Multidetector CT imaging of the left knee was performed according to the standard protocol. Multiplanar CT image reconstructions were also generated. RADIATION DOSE REDUCTION: This exam was performed according to the departmental dose-optimization program which includes automated exposure control, adjustment of the mA and/or kV according to patient size and/or use of iterative reconstruction technique. COMPARISON:  Left knee x-rays from same day. FINDINGS: Bones/Joint/Cartilage Prior total knee arthroplasty. No evidence of hardware failure or loosening. Acute nondisplaced periprosthetic fracture of the distal femoral metaphysis (series 5, image 57). No dislocation. Small to moderate lipohemarthrosis. Osteopenia. Ligaments Ligaments are suboptimally evaluated by CT. Muscles and Tendons Grossly intact. Soft tissue No fluid collection or hematoma.  No soft tissue mass. IMPRESSION: 1. Prior total knee arthroplasty with acute nondisplaced periprosthetic fracture of the distal femoral metaphysis. 2. Small to moderate lipohemarthrosis. Electronically Signed   By: Titus Dubin M.D.   On: 02/10/2021 19:03   CT Knee Right Wo Contrast  Result Date: 02/10/2021 CLINICAL DATA:  Right knee pain after fall. EXAM: CT OF THE  RIGHT KNEE WITHOUT CONTRAST TECHNIQUE: Multidetector CT imaging of the right knee was performed according to the standard protocol. Multiplanar CT image reconstructions were also generated. RADIATION DOSE REDUCTION: This exam was performed according to the departmental dose-optimization program which includes automated exposure control, adjustment of the mA and/or kV according to patient size and/or use of iterative reconstruction technique. COMPARISON:  Right knee x-rays from same day. FINDINGS: Bones/Joint/Cartilage Prior total knee arthroplasty. No evidence of hardware failure or loosening. No fracture or dislocation. Osteopenia. Trace joint effusion. Ligaments Ligaments are suboptimally evaluated by CT. Muscles and Tendons Grossly intact. Soft tissue No fluid collection or hematoma.  No soft tissue mass. IMPRESSION: 1. No acute osseous abnormality. 2. Prior total knee arthroplasty without evidence of hardware complication. Electronically Signed   By: Titus Dubin M.D.   On: 02/10/2021 18:58   DG Knee Complete 4 Views Left  Result Date: 02/10/2021 CLINICAL DATA:  Fall EXAM: LEFT KNEE - COMPLETE 4+ VIEW COMPARISON:  None. FINDINGS: Status post knee replacement. Acute nondisplaced periprosthetic fracture medial side of distal femur at the metaphysis. Positive for knee effusion. IMPRESSION: Knee replacement with acute nondisplaced periprosthetic distal femoral metaphyseal fracture. Electronically Signed   By: Donavan Foil M.D.   On: 02/10/2021 17:26   DG Knee Complete 4 Views Right  Result Date: 02/10/2021 CLINICAL DATA:  Mechanical fall EXAM: RIGHT KNEE - COMPLETE 4+ VIEW COMPARISON:  06/14/2015 FINDINGS: Status post knee replacement. No malalignment. No sizeable effusion. Questionable nondisplaced cortical fracture at the fibular head. IMPRESSION: 1. Post knee replacement. Questionable cortical fracture at the fibular head, correlate for point tenderness. Electronically Signed   By: Donavan Foil M.D.    On: 02/10/2021 17:28     Medical Consultants:   None.   Subjective:  [name] relates her pain is not controlled.  Objective:    Vitals:   02/12/21 0500 02/12/21 0530 02/12/21 0545 02/12/21 0600  BP: (!) 96/55 112/70 127/74 115/73  Pulse: 82 92 93 94  Resp: 18 18 (!) 21 (!) 24  Temp:      TempSrc:      SpO2: 95% 100% 100% 99%   SpO2: 99 %  No intake or output data in the 24 hours ending 02/12/21 0706 There were no vitals filed for this visit.  Exam: General exam: In no acute distress, morbidly obese Respiratory system: Good air movement and clear to auscultation. Cardiovascular system: S1 & S2 heard, RRR. No JVDd. Gastrointestinal system: Abdomen is nondistended, soft and nontender.  Extremities: No pedal edema. Skin: No rashes, lesions or ulcers Psychiatry: Judgement and insight appear normal. Mood & affect appropriate.  She does have slight pressured speech and tangential but she has good insight   Data Reviewed:    Labs: Basic Metabolic Panel: Recent Labs  Lab 02/10/21 1857 02/11/21 2225  NA 137 132*  K 5.3* 5.2*  CL 105 102  CO2 24 22  GLUCOSE 88 103*  BUN 9 13  CREATININE 1.25* 1.37*  CALCIUM 9.0 9.0   GFR Estimated Creatinine Clearance: 42 mL/min (A) (by C-G formula based on SCr of 1.37 mg/dL (H)). Liver Function Tests: No results for input(s): AST, ALT, ALKPHOS, BILITOT, PROT, ALBUMIN in the last 168 hours. No results for input(s): LIPASE, AMYLASE in the last 168 hours. No results for input(s): AMMONIA in the last 168 hours. Coagulation profile Recent Labs  Lab 02/12/21 0618  INR 1.4*   COVID-19 Labs  No results for input(s): DDIMER, FERRITIN, LDH, CRP in the last 72 hours.  Lab Results  Component Value Date   SARSCOV2NAA NEGATIVE 02/11/2021   [place] NEGATIVE 02/10/2021   [place] NEGATIVE 02/02/2021    CBC: Recent Labs  Lab 02/10/21 1857 02/11/21 2225 02/12/21 0618  WBC 6.9 6.4 4.9  NEUTROABS 5.8  --   3.8  HGB 10.7* 11.8* 10.7*  HCT 33.7* 36.1 32.6*  MCV 103.4* 103.1* 102.5*  PLT 113* 122* 135*   Cardiac Enzymes: No results for input(s): CKTOTAL, CKMB, CKMBINDEX, TROPONINI in the last 168 hours. BNP (last 3 results) No results for input(s): PROBNP in the last 8760 hours. CBG: No results for input(s): GLUCAP in the last 168 hours. D-Dimer: No results for input(s): DDIMER in the last 72 hours. Hgb A1c: No results for input(s): HGBA1C in the last 72 hours. Lipid Profile: No results for input(s): CHOL, HDL, LDLCALC, TRIG, CHOLHDL, LDLDIRECT in the last 72 hours. Thyroid function studies: No results for input(s): TSH, T4TOTAL, T3FREE, THYROIDAB in the last 72 hours.  Invalid input(s): FREET3 Anemia work up: No results for input(s): VITAMINB12, FOLATE, FERRITIN, TIBC, IRON, RETICCTPCT in the last 72 hours. Sepsis Labs: Recent Labs  Lab 02/10/21 1857 02/11/21 2225 02/12/21 0618  WBC 6.9 6.4 4.9   Microbiology Recent Results (from the past 240 hour(s))  Resp Panel by RT-PCR (Flu A&B, Covid) Nasopharyngeal Swab     Status: None   Collection Time: 02/10/21  6:57 PM   Specimen: Nasopharyngeal Swab; Nasopharyngeal(NP) swabs in vial transport medium  Result Value Ref Range Status   SARS Coronavirus 2 by RT PCR NEGATIVE NEGATIVE Final    Comment: (NOTE) SARS-CoV-2 target nucleic acids are NOT DETECTED.  The SARS-CoV-2 RNA is generally detectable in upper respiratory specimens during the acute phase of infection. The lowest concentration of  SARS-CoV-2 viral copies this assay can detect is 138 copies/mL. A negative result does not preclude SARS-Cov-2 infection and should not be used as the sole basis for treatment or other patient management decisions. A negative result may occur with  improper specimen collection/handling, submission of specimen other than nasopharyngeal swab, presence of viral mutation(s) within the areas targeted by this assay, and inadequate number of  viral copies(<138 copies/mL). A negative result must be combined with clinical observations, patient history, and epidemiological information. The expected result is Negative.  Fact Sheet for Patients:  EntrepreneurPulse.com.au  Fact Sheet for Healthcare Providers:  IncredibleEmployment.be  This test is no t yet approved or cleared by the [place] FDA and  has been authorized for detection and/or diagnosis of SARS-CoV-2 by FDA under an Emergency Use Authorization (EUA). This EUA will remain  in effect (meaning this test can be used) for the duration of the COVID-19 declaration under Section 564(b)(1) of the Act, 21 U.S.C.section 360bbb-3(b)(1), unless the authorization is terminated  or revoked sooner.       Influenza A by PCR NEGATIVE NEGATIVE Final   Influenza B by PCR NEGATIVE NEGATIVE Final    Comment: (NOTE) The Xpert Xpress SARS-CoV-2/FLU/RSV plus assay is intended as an aid in the diagnosis of influenza from Nasopharyngeal swab specimens and should not be used as a sole basis for treatment. Nasal washings and aspirates are unacceptable for Xpert Xpress SARS-CoV-2/FLU/RSV testing.  Fact Sheet for Patients: EntrepreneurPulse.com.au  Fact Sheet for Healthcare Providers: IncredibleEmployment.be  This test is not yet approved or cleared by the [place] FDA and has been authorized for detection and/or diagnosis of SARS-CoV-2 by FDA under an Emergency Use Authorization (EUA). This EUA will remain in effect (meaning this test can be used) for the duration of the COVID-19 declaration under Section 564(b)(1) of the Act, 21 U.S.C. section 360bbb-3(b)(1), unless the authorization is terminated or revoked.  Performed at [facility], [place] [address] [place],  [address]   Resp Panel by RT-PCR (Flu A&B, Covid) Nasopharyngeal Swab     Status: None   Collection Time: 02/11/21 10:25 PM    Specimen: Nasopharyngeal Swab; Nasopharyngeal(NP) swabs in vial transport medium  Result Value Ref Range Status   SARS Coronavirus 2 by RT PCR NEGATIVE NEGATIVE Final    Comment: (NOTE) SARS-CoV-2 target nucleic acids are NOT DETECTED.  The SARS-CoV-2 RNA is generally detectable in upper respiratory specimens during the acute phase of infection. The lowest concentration of SARS-CoV-2 viral copies this assay can detect is 138 copies/mL. A negative result does not preclude SARS-Cov-2 infection and should not be used as the sole basis for treatment or other patient management decisions. A negative result may occur with  improper specimen collection/handling, submission of specimen other than nasopharyngeal swab, presence of viral mutation(s) within the areas targeted by this assay, and inadequate number of viral copies(<138 copies/mL). A negative result must be combined with clinical observations, patient history, and epidemiological information. The expected result is Negative.  Fact Sheet for Patients:  EntrepreneurPulse.com.au  Fact Sheet for Healthcare Providers:  IncredibleEmployment.be  This test is no t yet approved or cleared by the [place] FDA and  has been authorized for detection and/or diagnosis of SARS-CoV-2 by FDA under an Emergency Use Authorization (EUA). This EUA will remain  in effect (meaning this test can be used) for the duration of the COVID-19 declaration under Section 564(b)(1) of the Act, 21 U.S.C.section 360bbb-3(b)(1), unless the authorization is terminated  or revoked  sooner.       Influenza A by PCR NEGATIVE NEGATIVE Final   Influenza B by PCR NEGATIVE NEGATIVE Final    Comment: (NOTE) The Xpert Xpress SARS-CoV-2/FLU/RSV plus assay is intended as an aid in the diagnosis of influenza from Nasopharyngeal swab specimens and should not be used as a sole basis for treatment. Nasal washings and aspirates are  unacceptable for Xpert Xpress SARS-CoV-2/FLU/RSV testing.  Fact Sheet for Patients: EntrepreneurPulse.com.au  Fact Sheet for Healthcare Providers: IncredibleEmployment.be  This test is not yet approved or cleared by the [place] FDA and has been authorized for detection and/or diagnosis of SARS-CoV-2 by FDA under an Emergency Use Authorization (EUA). This EUA will remain in effect (meaning this test can be used) for the duration of the COVID-19 declaration under Section 564(b)(1) of the Act, 21 U.S.C. section 360bbb-3(b)(1), unless the authorization is terminated or revoked.  Performed at [facility], [place] [address] [place], [place] [address]      Medications:    [START ON 02/13/2021] ALPRAZolam  0.5 mg Oral BID   DULoxetine  20 mg Oral Daily   hydrocortisone  10 mg Oral Daily   hydrocortisone  5 mg Oral QHS   levETIRAcetam  500 mg Oral BID   primidone  100 mg Oral q morning   Continuous Infusions:    LOS: 1 day   Charlynne Cousins  [facility]  02/12/2021, 7:06 AM

## 2021-02-12 NOTE — ED Notes (Signed)
Pt assisted back to bed

## 2021-02-12 NOTE — H&P (Signed)
History and Physical    PLEASE NOTE THAT DRAGON DICTATION SOFTWARE WAS USED IN THE CONSTRUCTION OF THIS NOTE.   Carla Leonard WNI:[id number] DOB: 12-30-1955 DOA: 02/11/2021  PCP: Pcp, No (will further assess) Patient coming from: home   I have personally briefly reviewed patient's old medical records in [place]  Chief Complaint: Left knee pain  HPI: Carla Leonard is a 66 y.o. female with medical history significant for degenerative osteoarthritis of the bilateral knees status post bilateral TKA in 2013, hypertension, essential hypertension, seizure disorder, generalized anxiety disorder, adrenal insufficiency who is admitted to [facility] on 02/11/2021 with acute nondisplaced periprosthetic fracture of the distal left femur after presenting from home to [facility] ED complaining of left knee pain.   Patient reports tripping while attempting to ambulate at home, resulting in a ground level fall on 02/10/2021 during which the left knee was the principal point of contact with the floor below. As a result of this fall, the patient reports immediate development of sharp left knee pain, without radiation.  She initially presented to [facility] long emergency department yesterday complaining of this acute left knee pain, at which time imaging revealed acute nondisplaced periprosthetic fracture of the distal left femur.  Case/imaging were reportedly discussed with orthopedic surgery at that time, who recommended conservative measures, as well as outpatient follow-up in orthopedic surgery clinic.  While in the ED at that time, the patient passed her ambulation trial, and was subsequently discharged to home from the ED.   However, following discharge to home from the emergency department yesterday, the patient reports further progression and associated with the left knee discomfort, resulting in diminished ability to ambulate as a consequence of the suboptimal pain control, prompting her to present  back to [facility] emergency department today for further evaluation and management thereof.  Otherwise, denies any acute arthralgias or myalgias as a result of the above fall.  Denies any acute numbness or paresthesias in bilateral lower extremities.   She confirms that she did not hit head as a component of the ground-level mechanical fall on 02/10/2021. denies any associated loss of consciousness.  Denies any preceding or associated chest pain, shortness of breath, diaphoresis, palpitations, nausea, vomiting, dizziness, presyncope, or syncope.  Denies any subsequent headache, neck pain, blurry vision, or diplopia.  Not on any blood thinners as an outpatient, including no aspirin.  Denies any known history of coronary artery disease or CHF. denies any recent orthopnea, PND, or peripheral edema.   Of note, in the setting of bilateral TKA in 2013, the patient follows with Dr. Pearline Cables as her outpatient orthopedic surgeon.     ED Course:  Vital signs in the ED were notable for the following: Afebrile; heart rate 84; blood pressure 152/99; respiratory rate 18, oxygen saturation 99 to 100% on room air.  Labs were notable for the following: (No new labs were drawn today)  Imaging and additional notable ED work-up: (No new imaging or EKG were performed today)  While in the ED, the following were administered: Norco 5/325 mg p.o. x1 dose.  EDP discussed the patient's case and imaging with the on-call orthopedic surgeon, Dr. Mable Fill, who recommended admission to the hospitalist service for further evaluation and management of acute nondisplaced periprosthetic fracture of the distal left femur, with plan for orthopedic surgery to formally consult in the morning, at which time additional recommendations will be made.   Subsequently, the patient was admitted for further evaluation management, displaced periprosthetic distal  left femur fracture in the setting of ground-level mechanical fall and associated  inability to ambulate as a consequence of corresponding suboptimal pain control.     Review of Systems: As per HPI otherwise 10 point review of systems negative.   Past Medical History:  Diagnosis Date   Arthritis    Depression    History of kidney stones    Hypertension     Past Surgical History:  Procedure Laterality Date   APPENDECTOMY     CHOLECYSTECTOMY     ELBOW SURGERY     RIGHT   JOINT REPLACEMENT     right TKA 06/2010   KNEE ARTHROPLASTY  06/17/2011   Procedure: COMPUTER ASSISTED TOTAL KNEE ARTHROPLASTY;  Surgeon: Alta Corning, MD;  Location: [place];  Service: Orthopedics;  Laterality: Left;  TOTAL KNEE REPLACEMENT WITH GENERAL ANESTHESIA AND PRE OP FEMORAL NERVE BLOCK   PERIANAL CYST      Social History:  reports that she has been smoking cigarettes. She has been smoking an average of .25 packs per day. She does not have any smokeless tobacco history on file. She reports current alcohol use. She reports that she does not use drugs.   Allergies  Allergen Reactions   Sulfa Antibiotics Anaphylaxis   Prednisone Other (See Comments)    hallucinations   Nsaids Other (See Comments)    Stomach pain, diarrhea   Other Other (See Comments)    Steroids: hallucinations    History reviewed. No pertinent family history.   Prior to Admission medications   Medication Sig Start Date End Date Taking? Authorizing Provider  ALPRAZolam Duanne Moron) 1 MG tablet Take 0.5 mg by mouth See admin instructions. Take 1/2 tablet (0.5 mg) by mouth every morning and at bedtime (with trazodone); may also take 1/2 tablet (0.5 mg) twice daily as needed for anxiety 01/25/21  Yes [provider]  cephALEXin (KEFLEX) 500 MG capsule Take 500 mg by mouth every 6 (six) hours.   Yes [provider]  levETIRAcetam (KEPPRA) 500 MG tablet Take 500 mg by mouth 2 (two) times daily. 01/20/21  Yes [provider]  primidone (MYSOLINE) 50 MG tablet Take 100 mg by mouth every morning.  01/21/21  Yes [provider]  promethazine (PHENERGAN) 12.5 MG tablet Take 12.5 mg by mouth every 4 (four) hours as needed for nausea or vomiting. 01/20/21  Yes [provider]  spironolactone (ALDACTONE) 50 MG tablet Take 50 mg by mouth 2 (two) times daily.   Yes [provider]  traZODone (DESYREL) 100 MG tablet Take 100 mg by mouth at bedtime. Take with 0.5 mg alprazolam 01/20/21  Yes [provider]  amoxicillin-clavulanate (AUGMENTIN) 875-125 MG tablet Take 1 tablet by mouth every 12 (twelve) hours for 7 days. 02/05/21 02/12/21  Bonnielee Haff, MD  BELSOMRA 20 MG TABS Take 1 tablet by mouth at bedtime. 11/30/20   [provider]  budesonide-formoterol (SYMBICORT) 160-4.5 MCG/ACT inhaler Inhale 2 puffs into the lungs 2 (two) times daily as needed (shortness of breath/wheezing). 01/20/21   [provider]  DULoxetine (CYMBALTA) 20 MG capsule Take by mouth. 01/25/21   [provider]  HYDROcodone-acetaminophen (NORCO/VICODIN) 5-325 MG tablet Take 1-2 tablets by mouth every 4 (four) hours as needed for severe pain. 02/10/21   Regan Lemming, MD  hydrocortisone (CORTEF) 5 MG tablet Take 2 tablets (10mg ) every morning and take 1 tablet (5mg ) every night. 02/05/21   Bonnielee Haff, MD     Objective    Physical Exam:  Vitals:   02/11/21 1849 02/11/21 2330  BP: (!) 152/99 (!) 111/56  Pulse: 84 84  Resp: 18   Temp: 98.3 F (36.8 C)   TempSrc: Oral   SpO2: 99% 100%    General: appears to be stated age; alert, oriented Skin: warm, dry, no rash Head:  AT/ Mouth:  Oral mucosa membranes appear moist, normal dentition Neck: supple; trachea midline Heart:  RRR; did not appreciate any M/R/G Lungs: CTAB, did not appreciate any wheezes, rales, or rhonchi Abdomen: + BS; soft, ND, NT Vascular: 2+ pedal pulses b/l; 2+ radial pulses b/l Extremities: no peripheral edema, no muscle wasting Neuro: strength and sensation intact in upper and lower  extremities b/l    Labs on Admission: I have personally reviewed following labs and imaging studies  CBC: Recent Labs  Lab 02/05/21 0619 02/10/21 1857 02/11/21 2225  WBC 4.6 6.9 6.4  NEUTROABS  --  5.8  --   HGB 9.7* 10.7* 11.8*  HCT 29.6* 33.7* 36.1  MCV 100.3* 103.4* 103.1*  PLT 83* 113* 762*   Basic Metabolic Panel: Recent Labs  Lab 02/05/21 0619 02/10/21 1857 02/11/21 2225  NA 133* 137 132*  K 4.0 5.3* 5.2*  CL 104 105 102  CO2 22 24 22   GLUCOSE 118* 88 103*  BUN 14 9 13   CREATININE 1.12* 1.25* 1.37*  CALCIUM 7.8* 9.0 9.0   GFR: Estimated Creatinine Clearance: 42 mL/min (A) (by C-G formula based on SCr of 1.37 mg/dL (H)). Liver Function Tests: Recent Labs  Lab 02/05/21 0619  AST 27  ALT 18  ALKPHOS 90  BILITOT 1.1  PROT 5.2*  ALBUMIN 1.9*   No results for input(s): LIPASE, AMYLASE in the last 168 hours. No results for input(s): AMMONIA in the last 168 hours. Coagulation Profile: No results for input(s): INR, PROTIME in the last 168 hours. Cardiac Enzymes: No results for input(s): CKTOTAL, CKMB, CKMBINDEX, TROPONINI in the last 168 hours. BNP (last 3 results) No results for input(s): PROBNP in the last 8760 hours. HbA1C: No results for input(s): HGBA1C in the last 72 hours. CBG: No results for input(s): GLUCAP in the last 168 hours. Lipid Profile: No results for input(s): CHOL, HDL, LDLCALC, TRIG, CHOLHDL, LDLDIRECT in the last 72 hours. Thyroid Function Tests: No results for input(s): TSH, T4TOTAL, FREET4, T3FREE, THYROIDAB in the last 72 hours. Anemia Panel: No results for input(s): VITAMINB12, FOLATE, FERRITIN, TIBC, IRON, RETICCTPCT in the last 72 hours. Urine analysis:    Component Value Date/Time   COLORURINE YELLOW 02/01/2021 2337   APPEARANCEUR CLOUDY (A) 02/01/2021 2337   LABSPEC <1.005 (L) 02/01/2021 2337   PHURINE 6.5 02/01/2021 2337   GLUCOSEU NEGATIVE 02/01/2021 2337   HGBUR TRACE (A) 02/01/2021 2337   BILIRUBINUR NEGATIVE  02/01/2021 [place] 02/01/2021 2337   PROTEINUR NEGATIVE 02/01/2021 2337   UROBILINOGEN 0.2 06/17/2011 0636   NITRITE NEGATIVE 02/01/2021 2337   LEUKOCYTESUR TRACE (A) 02/01/2021 2337    Radiological Exams on Admission: CT Lumbar Spine Wo Contrast  Result Date: 02/10/2021 CLINICAL DATA:  Back trauma.  Fall EXAM: CT LUMBAR SPINE WITHOUT CONTRAST TECHNIQUE: Multidetector CT imaging of the lumbar spine was performed without intravenous contrast administration. Multiplanar CT image reconstructions were also generated. RADIATION DOSE REDUCTION: This exam was performed according to the departmental dose-optimization program which includes automated exposure control, adjustment of the mA and/or kV according to patient size and/or use of iterative reconstruction technique. COMPARISON:  Lumbar MRI 11/26/2016 FINDINGS: Segmentation: 5 lumbar vertebra Alignment:  Mild anterolisthesis L4-5 Vertebrae: Negative for fracture or mass in the lumbar spine. Lower thoracic spine included to the T11 level. Paraspinal and other soft tissues: Negative for paraspinous mass or adenopathy Ascites around the liver and in the pelvis. Small liver with nodular contour compatible with cirrhosis. Probable renal artery calcification in the left renal hilum. No hydronephrosis. Disc levels: T12-L1: Negative L1-2: Mild disc bulging without stenosis. L2-3: Disc degeneration with gas in the disc space. Diffuse disc bulging and bilateral facet degeneration. Mild subarticular stenosis bilaterally L3-4: Diffuse bulging of the disc and moderate facet degeneration. Chronic left foraminal disc protrusion unchanged from the prior study. Mild spinal stenosis. Moderate subarticular and foraminal stenosis on the left. L4-5: Mild anterolisthesis. Diffuse disc bulging and advanced facet degeneration. Moderate to severe spinal stenosis. Moderate to severe subarticular and foraminal stenosis bilaterally left greater than right L5-S1:  Advanced facet degeneration. Moderate subarticular and foraminal stenosis bilaterally. IMPRESSION: 1. Negative for lumbar fracture 2. Multilevel lumbar degenerative change causing spinal and foraminal stenosis most severe at L4-5 3. Cirrhosis liver with ascites. Electronically Signed   By: Franchot Gallo M.D.   On: 02/10/2021 18:54   CT Knee Left Wo Contrast  Result Date: 02/10/2021 CLINICAL DATA:  Left knee pain after fall. EXAM: CT OF THE LEFT KNEE WITHOUT CONTRAST TECHNIQUE: Multidetector CT imaging of the left knee was performed according to the standard protocol. Multiplanar CT image reconstructions were also generated. RADIATION DOSE REDUCTION: This exam was performed according to the departmental dose-optimization program which includes automated exposure control, adjustment of the mA and/or kV according to patient size and/or use of iterative reconstruction technique. COMPARISON:  Left knee x-rays from same day. FINDINGS: Bones/Joint/Cartilage Prior total knee arthroplasty. No evidence of hardware failure or loosening. Acute nondisplaced periprosthetic fracture of the distal femoral metaphysis (series 5, image 57). No dislocation. Small to moderate lipohemarthrosis. Osteopenia. Ligaments Ligaments are suboptimally evaluated by CT. Muscles and Tendons Grossly intact. Soft tissue No fluid collection or hematoma.  No soft tissue mass. IMPRESSION: 1. Prior total knee arthroplasty with acute nondisplaced periprosthetic fracture of the distal femoral metaphysis. 2. Small to moderate lipohemarthrosis. Electronically Signed   By: Titus Dubin M.D.   On: 02/10/2021 19:03   CT Knee Right Wo Contrast  Result Date: 02/10/2021 CLINICAL DATA:  Right knee pain after fall. EXAM: CT OF THE RIGHT KNEE WITHOUT CONTRAST TECHNIQUE: Multidetector CT imaging of the right knee was performed according to the standard protocol. Multiplanar CT image reconstructions were also generated. RADIATION DOSE REDUCTION: This exam was  performed according to the departmental dose-optimization program which includes automated exposure control, adjustment of the mA and/or kV according to patient size and/or use of iterative reconstruction technique. COMPARISON:  Right knee x-rays from same day. FINDINGS: Bones/Joint/Cartilage Prior total knee arthroplasty. No evidence of hardware failure or loosening. No fracture or dislocation. Osteopenia. Trace joint effusion. Ligaments Ligaments are suboptimally evaluated by CT. Muscles and Tendons Grossly intact. Soft tissue No fluid collection or hematoma.  No soft tissue mass. IMPRESSION: 1. No acute osseous abnormality. 2. Prior total knee arthroplasty without evidence of hardware complication. Electronically Signed   By: Titus Dubin M.D.   On: 02/10/2021 18:58   DG Knee Complete 4 Views Left  Result Date: 02/10/2021 CLINICAL DATA:  Fall EXAM: LEFT KNEE - COMPLETE 4+ VIEW COMPARISON:  None. FINDINGS: Status post knee replacement. Acute nondisplaced periprosthetic fracture medial side of distal femur at the metaphysis. Positive for knee effusion. IMPRESSION: Knee replacement with acute nondisplaced  periprosthetic distal femoral metaphyseal fracture. Electronically Signed   By: Donavan Foil M.D.   On: 02/10/2021 17:26   DG Knee Complete 4 Views Right  Result Date: 02/10/2021 CLINICAL DATA:  Mechanical fall EXAM: RIGHT KNEE - COMPLETE 4+ VIEW COMPARISON:  06/14/2015 FINDINGS: Status post knee replacement. No malalignment. No sizeable effusion. Questionable nondisplaced cortical fracture at the fibular head. IMPRESSION: 1. Post knee replacement. Questionable cortical fracture at the fibular head, correlate for point tenderness. Electronically Signed   By: Donavan Foil M.D.   On: 02/10/2021 17:28      Assessment/Plan    Principal Problem:   Closed bicondylar fracture of distal femur, left, initial encounter [facility]) Active Problems:   Essential hypertension   Anxiety and depression   Seizure  disorder (HCC)   Adrenal insufficiency (HCC)   Fall at home, initial encounter     #) Acute nondisplaced periprosthetic fracture of the distal left femur confirmed via presenting imaging yesterday stemming from ground-level mechanical fall without associated loss of consciousness, with this fall occurring on 02/10/2021, as further detailed above.  At this time, the left lower extremity appears neurovascularly intact. Not on any blood thinners at home, including no aspirin.   The patient's case/imaging were discussed with the on-call orthopedic surgeon, Dr. Mable Fill,  who recommended admission to the hospitalist service for further evaluation/management of acute nondisplaced periprosthetic fracture of the distal left femur, with orthopedic surgery to formally consult in the morning, with additional recommendations to occur at that time.    Plan: Formal orthopedic surgery consult, as above.  In case there is subsequent pursuit of operative management, will keep n.p.o. after midnight.  Check EKG as well as INR.  Refraining from pharmacologic DVT prophylaxis.  SCDs.  Prn IV fentanyl.  Fall precautions ordered.        #) Ground level mechanical fall: The patient reports a ground level mechanical fall that occurred on 02/10/2021, in which the patient tripped without any associated loss of consciousness. Appears to be purely mechanical in nature, without clinical evidence at this time to suggest contributory dizziness, presyncope, syncope, or acute ischemic CVA. Does not appear to have hit head as component of this fall.  While this fall appears to be purely mechanical in nature, will also check urinalysis to evaluate for any underlying infectious contribution.   Plan: Check urinalysis, as above.  Repeat BMP and CBC with differential in the morning. Fall precautions.        #) Depression: Documented history of such: On Cymbalta as an outpatient.  Plan: Continue home Cymbalta.  Check EKG, including  evaluate QTc interval.         #) Essential Hypertension: documented h/o such, with outpatient antihypertensive regimen including spironolactone.  SBP's in the ED today: 150s mmHg.   Plan: Close monitoring of subsequent BP via routine VS. hold home spironolactone for now, pending repeat BMP, noting that no admission labs have been ordered thus far today.        #) Seizure disorder: On scheduled Keppra as well as primidone as an outpatient.  No evidence to suggest active seizures at this time.  Plan: Continue home [place] as well as primidone.       #) Generalized anxiety disorder: Documented history of such, on Cymbalta as well as scheduled Xanax on a twice daily basis as an outpatient.  Will resume home Xanax, but on a as needed basis as opposed to her typical scheduled frequency as an outpatient.  Plan: Continue home Cymbalta.  Continue home Xanax on a as needed basis, as above.        #) Adrenal insufficiency: Documented history of such, on scheduled hydrocortisone as an outpatient.  Plan: Resume home hydrocortisone.        #) Chronic tobacco abuse: Patient conveys that they are a current smoker, currently smoking 1/4 pack/day, which represents a significant reduction in her prior smoking habits in which she smoked 2 packs/day for greater than 20 years .   Plan: Counseled the patient on the importance of complete smoking discontinuation.  Order placed for prn nicotine patch for use during this hospitalization.      DVT prophylaxis: SCD's   Code Status: Full code Family Communication: none Disposition Plan: Per Rounding Team Consults called: Case discussed with on-call orthopedic surgery, Dr. Mable Fill, as further detailed above;  Admission status: Inpatient    PLEASE NOTE THAT DRAGON DICTATION SOFTWARE WAS USED IN THE CONSTRUCTION OF THIS NOTE.   [place] DO [facility] [place]   02/12/2021, 1:18 AM

## 2021-02-12 NOTE — ED Notes (Signed)
Pt given ginger ale and pitcher of ice water

## 2021-02-12 NOTE — ED Notes (Signed)
Pt received lunch tray 

## 2021-02-12 NOTE — Consult Note (Signed)
Orthopaedic Consult  Date/Time: 02/12/21 9:19 AM  Patient Name: Carla Leonard  Attending Physician: Charlynne Cousins, MD   Time first seen by orthopaedics: 0910  ASSESSMENT & PLAN  Orthopaedic Assessment: 66 y.o. female with medical history of seizure disorder and multiple other medical problems, bounce back to ER with nondisplaced left periprosthetic distal femur fracture.  Reductions/Procedures/Splinting/Anesthesia Performed: Reductions: None Splinting/casting: Placement of knee immobilizer Procedure(s): None Anesthesia: N/A  Plan: Appropriately sized knee immobilizer placed for appropriate fracture immobilization.  Patient discussed with Dr. Berenice Primas.  Continue nonoperative treatment at this time.  Continue weightbearing as tolerated in knee immobilizer.  Given failure to fly home patient will likely benefit from subacute rehab/SNF.  Patient should keep follow-up appointment with Dr. Berenice Primas this coming week.  No orthopaedic interventions at this time.   Georgeanna Harrison M.D. Orthopaedic Surgery [facility]   Medical Decision Making  Amount/complexity of data: Is there a pathologic fracture (e.g. neoplastic, osteoporotic insufficiency fracture)? Yes Independent interpretation of radiographic studies: Yes Review of radiology results (e.g. reports): Yes Tests ordered (e.g. additional radiographic studies, labs): No Lab results reviewed: Yes Reviewed old records: Yes History from another source (independent historian, e.g. family/friend/etc.): Yes Risk: Patient receiving IV controlled substances for pain: Yes Fracture requiring manipulation: No Urgent or emergent (non-elective) surgery likely this admission: No Presence of medical comorbidities and/or surgical risk factors (e.g. current smoker, CAD, diabetes, COPD, CKD, etc.): Yes Closed fracture management WITHOUT manipulation: Yes Urgent minor procedure (e.g. joint aspiration, compartment  pressure measurement, etc.): No Will likely need surgery as an outpatient: No     HPI EDNA REDE is a 66 y.o. female. Orthopaedic consultation has specifically been requested to address this patient's current musculoskeletal presentation.  Patient discussed with that history obtained from Dr. Berenice Primas, in addition to patient.  She apparently sustained a mechanical fall at home 2 days ago and came into the emergency room where she was diagnosed with a left distal femur periprosthetic fracture above total knee implant from 2013.  She was recommended nonoperative treatment with weightbearing as tolerated in the immobilizer and discharged home.  Unfortunately she was unable to mobilize or care for herself at home and she returned to the [facility] emergency room.   PMH Past Medical History:  Diagnosis Date   Arthritis    Depression    History of kidney stones    Hypertension      PSH Past Surgical History:  Procedure Laterality Date   APPENDECTOMY     CHOLECYSTECTOMY     ELBOW SURGERY     RIGHT   JOINT REPLACEMENT     right TKA 06/2010   KNEE ARTHROPLASTY  06/17/2011   Procedure: COMPUTER ASSISTED TOTAL KNEE ARTHROPLASTY;  Surgeon: Alta Corning, MD;  Location: [place];  Service: Orthopedics;  Laterality: Left;  TOTAL KNEE REPLACEMENT WITH GENERAL ANESTHESIA AND PRE OP FEMORAL NERVE BLOCK   PERIANAL CYST     Home Medications Prior to Admission medications   Medication Sig Start Date End Date Taking? Authorizing Provider  ALPRAZolam Duanne Moron) 1 MG tablet Take 0.5 mg by mouth See admin instructions. Take 1/2 tablet (0.5 mg) by mouth every morning and at bedtime (with trazodone); may also take 1/2 tablet (0.5 mg) twice daily as needed for anxiety 01/25/21  Yes [provider]  cephALEXin (KEFLEX) 500 MG capsule Take 500 mg by mouth every 6 (six) hours.   Yes [provider]  levETIRAcetam (KEPPRA) 500 MG tablet Take 500  mg by mouth 2 (two) times daily. 01/20/21  Yes [provider]  primidone (MYSOLINE) 50 MG tablet Take 100 mg by mouth every morning. 01/21/21  Yes [provider]  promethazine (PHENERGAN) 12.5 MG tablet Take 12.5 mg by mouth every 4 (four) hours as needed for nausea or vomiting. 01/20/21  Yes [provider]  spironolactone (ALDACTONE) 50 MG tablet Take 50 mg by mouth 2 (two) times daily.   Yes [provider]  traZODone (DESYREL) 100 MG tablet Take 100 mg by mouth at bedtime. Take with 0.5 mg alprazolam 01/20/21  Yes [provider]  amoxicillin-clavulanate (AUGMENTIN) 875-125 MG tablet Take 1 tablet by mouth every 12 (twelve) hours for 7 days. 02/05/21 02/12/21  Bonnielee Haff, MD  BELSOMRA 20 MG TABS Take 1 tablet by mouth at bedtime. 11/30/20   [provider]  budesonide-formoterol (SYMBICORT) 160-4.5 MCG/ACT inhaler Inhale 2 puffs into the lungs 2 (two) times daily as needed (shortness of breath/wheezing). 01/20/21   [provider]  DULoxetine (CYMBALTA) 20 MG capsule Take by mouth. 01/25/21   [provider]  HYDROcodone-acetaminophen (NORCO/VICODIN) 5-325 MG tablet Take 1-2 tablets by mouth every 4 (four) hours as needed for severe pain. 02/10/21   Regan Lemming, MD  hydrocortisone (CORTEF) 5 MG tablet Take 2 tablets (10mg ) every morning and take 1 tablet (5mg ) every night. 02/05/21   Bonnielee Haff, MD     Allergies Allergies  Allergen Reactions   Sulfa Antibiotics Anaphylaxis   Prednisone Other (See Comments)    hallucinations   Nsaids Other (See Comments)    Stomach pain, diarrhea   Other Other (See Comments)    Steroids: hallucinations     Family History History reviewed. No pertinent family history.  Social History Social History   Socioeconomic History   Marital status: Legally Separated    Spouse name: Not on file   Number of children: Not on file   Years of education: Not on file   Highest education level: Not on file  Occupational History   Not on file   Tobacco Use   Smoking status: Every Day    Packs/day: 0.25    Types: Cigarettes   Smokeless tobacco: Not on file   Tobacco comments:    Former 2 ppd  Substance and Sexual Activity   Alcohol use: Yes   Drug use: No   Sexual activity: Not on file  Other Topics Concern   Not on file  Social History Narrative   Not on file   Social Determinants of Health   Financial Resource Strain: Not on file  Food Insecurity: Not on file  Transportation Needs: Not on file  Physical Activity: Not on file  Stress: Not on file  Social Connections: Not on file  Intimate Partner Violence: Not on file     Review of Systems MSK: As noted per HPI above GI: No current Nausea/vomiting ENT: Denies sore throat, epistaxis CV: Denies chest pain  Resp: No current shortness of breath  Other than mentioned above, there are no Constitutional, Neurological, Psychiatric, ENT, Ophthalmological, Cardiovascular, Respiratory, GI, GU, Musculoskeletal, Integumentary, Lymphatic, Endocrine or Allergic issues.     Imaging  Independent interpretation of orthopaedic-relevant films: Multiple radiographic views of the left knee and CT of left knee demonstrate nondisplaced fracture of the distal femur above total knee with well fixed implants.  Radiographic results: CT Lumbar Spine Wo Contrast  Result Date: 02/10/2021 CLINICAL DATA:  Back trauma.  Fall EXAM: CT LUMBAR SPINE WITHOUT CONTRAST  TECHNIQUE: Multidetector CT imaging of the lumbar spine was performed without intravenous contrast administration. Multiplanar CT image reconstructions were also generated. RADIATION DOSE REDUCTION: This exam was performed according to the departmental dose-optimization program which includes automated exposure control, adjustment of the mA and/or kV according to patient size and/or use of iterative reconstruction technique. COMPARISON:  Lumbar MRI 11/26/2016 FINDINGS: Segmentation: 5 lumbar vertebra Alignment: Mild anterolisthesis L4-5  Vertebrae: Negative for fracture or mass in the lumbar spine. Lower thoracic spine included to the T11 level. Paraspinal and other soft tissues: Negative for paraspinous mass or adenopathy Ascites around the liver and in the pelvis. Small liver with nodular contour compatible with cirrhosis. Probable renal artery calcification in the left renal hilum. No hydronephrosis. Disc levels: T12-L1: Negative L1-2: Mild disc bulging without stenosis. L2-3: Disc degeneration with gas in the disc space. Diffuse disc bulging and bilateral facet degeneration. Mild subarticular stenosis bilaterally L3-4: Diffuse bulging of the disc and moderate facet degeneration. Chronic left foraminal disc protrusion unchanged from the prior study. Mild spinal stenosis. Moderate subarticular and foraminal stenosis on the left. L4-5: Mild anterolisthesis. Diffuse disc bulging and advanced facet degeneration. Moderate to severe spinal stenosis. Moderate to severe subarticular and foraminal stenosis bilaterally left greater than right L5-S1: Advanced facet degeneration. Moderate subarticular and foraminal stenosis bilaterally. IMPRESSION: 1. Negative for lumbar fracture 2. Multilevel lumbar degenerative change causing spinal and foraminal stenosis most severe at L4-5 3. Cirrhosis liver with ascites. Electronically Signed   By: Franchot Gallo M.D.   On: 02/10/2021 18:54   CT Knee Left Wo Contrast  Result Date: 02/10/2021 CLINICAL DATA:  Left knee pain after fall. EXAM: CT OF THE LEFT KNEE WITHOUT CONTRAST TECHNIQUE: Multidetector CT imaging of the left knee was performed according to the standard protocol. Multiplanar CT image reconstructions were also generated. RADIATION DOSE REDUCTION: This exam was performed according to the departmental dose-optimization program which includes automated exposure control, adjustment of the mA and/or kV according to patient size and/or use of iterative reconstruction technique. COMPARISON:  Left knee x-rays  from same day. FINDINGS: Bones/Joint/Cartilage Prior total knee arthroplasty. No evidence of hardware failure or loosening. Acute nondisplaced periprosthetic fracture of the distal femoral metaphysis (series 5, image 57). No dislocation. Small to moderate lipohemarthrosis. Osteopenia. Ligaments Ligaments are suboptimally evaluated by CT. Muscles and Tendons Grossly intact. Soft tissue No fluid collection or hematoma.  No soft tissue mass. IMPRESSION: 1. Prior total knee arthroplasty with acute nondisplaced periprosthetic fracture of the distal femoral metaphysis. 2. Small to moderate lipohemarthrosis. Electronically Signed   By: Titus Dubin M.D.   On: 02/10/2021 19:03   CT Knee Right Wo Contrast  Result Date: 02/10/2021 CLINICAL DATA:  Right knee pain after fall. EXAM: CT OF THE RIGHT KNEE WITHOUT CONTRAST TECHNIQUE: Multidetector CT imaging of the right knee was performed according to the standard protocol. Multiplanar CT image reconstructions were also generated. RADIATION DOSE REDUCTION: This exam was performed according to the departmental dose-optimization program which includes automated exposure control, adjustment of the mA and/or kV according to patient size and/or use of iterative reconstruction technique. COMPARISON:  Right knee x-rays from same day. FINDINGS: Bones/Joint/Cartilage Prior total knee arthroplasty. No evidence of hardware failure or loosening. No fracture or dislocation. Osteopenia. Trace joint effusion. Ligaments Ligaments are suboptimally evaluated by CT. Muscles and Tendons Grossly intact. Soft tissue No fluid collection or hematoma.  No soft tissue mass. IMPRESSION: 1. No acute osseous abnormality. 2. Prior total knee arthroplasty without evidence of hardware complication. Electronically  Signed   By: Titus Dubin M.D.   On: 02/10/2021 18:58   CT ABDOMEN PELVIS W CONTRAST  Result Date: 02/01/2021 CLINICAL DATA:  Acute abdominal pain. Wound drainage below belly button. EXAM:  CT ABDOMEN AND PELVIS WITH CONTRAST TECHNIQUE: Multidetector CT imaging of the abdomen and pelvis was performed using the standard protocol following bolus administration of intravenous contrast. RADIATION DOSE REDUCTION: This exam was performed according to the departmental dose-optimization program which includes automated exposure control, adjustment of the mA and/or kV according to patient size and/or use of iterative reconstruction technique. CONTRAST:  114mL OMNIPAQUE IOHEXOL 300 MG/ML  SOLN COMPARISON:  CT abdomen and pelvis 01/08/2021. FINDINGS: Lower chest: No acute abnormality. Hepatobiliary: Patient is status post cholecystectomy. There is mild intra and extrahepatic biliary ductal dilatation which appears similar to the prior study. No focal liver lesions are identified. Pancreas: Unremarkable. No pancreatic ductal dilatation or surrounding inflammatory changes. Spleen: Mildly enlarged, unchanged. Adrenals/Urinary Tract: There are rounded hypodensities in both kidneys which are too small to characterize, likely cyst. Otherwise, the kidneys, adrenal glands and bladder are within normal limits. Stomach/Bowel: Stomach is within normal limits. Appendix appears normal. No evidence of bowel wall thickening, distention, or inflammatory changes. The appendix is not visualized. Vascular/Lymphatic: Aortic atherosclerosis. No enlarged abdominal or pelvic lymph nodes. Prominent pericardial lymph node measures 7 mm. Reproductive: Uterus and bilateral adnexa are unremarkable. Other: There is a small amount of free fluid in the pelvis. Ascites has significantly decreased. Minimal fluid tracking along the bilateral pericolic gutters. Central mesenteric haziness. No focal body wall edema. There is anterior body wall subcutaneous stranding and skin thickening. There is a small enhancing fluid collection in the subcutaneous tissues the level of the inferior umbilicus measuring 3.1 by 0.9 x 2.2 cm. No evidence for soft  tissue gas. Musculoskeletal: Multilevel degenerative changes affect the spine. IMPRESSION: 1. Small enhancing fluid collections in the subcutaneous tissues at the level of the umbilicus measuring 3.1 x 0.9 x 2.2 cm. Abscess not excluded. No evidence for soft tissue gas. 2. Anterior abdominal wall subcutaneous stranding and skin thickening may be related to cellulitis. 3. Significant decrease in ascites.  Mild ascites persists. 4. Stable splenomegaly. 5.  Aortic Atherosclerosis (ICD10-I70.0). Electronically Signed   By: Ronney Asters M.D.   On: 02/01/2021 20:34   DG Knee Complete 4 Views Left  Result Date: 02/10/2021 CLINICAL DATA:  Fall EXAM: LEFT KNEE - COMPLETE 4+ VIEW COMPARISON:  None. FINDINGS: Status post knee replacement. Acute nondisplaced periprosthetic fracture medial side of distal femur at the metaphysis. Positive for knee effusion. IMPRESSION: Knee replacement with acute nondisplaced periprosthetic distal femoral metaphyseal fracture. Electronically Signed   By: Donavan Foil M.D.   On: 02/10/2021 17:26   DG Knee Complete 4 Views Right  Result Date: 02/10/2021 CLINICAL DATA:  Mechanical fall EXAM: RIGHT KNEE - COMPLETE 4+ VIEW COMPARISON:  06/14/2015 FINDINGS: Status post knee replacement. No malalignment. No sizeable effusion. Questionable nondisplaced cortical fracture at the fibular head. IMPRESSION: 1. Post knee replacement. Questionable cortical fracture at the fibular head, correlate for point tenderness. Electronically Signed   By: Donavan Foil M.D.   On: 02/10/2021 17:28   IR ABDOMEN US LIMITED  Result Date: 02/02/2021 CLINICAL DATA:  Abdominal distension, assess for ascites EXAM: LIMITED ABDOMEN ULTRASOUND FOR ASCITES TECHNIQUE: Limited ultrasound survey for ascites was performed in all four abdominal quadrants. COMPARISON:  02/01/2021 CT FINDINGS: Ultrasound of the abdominal 4 quadrants demonstrates no significant abdominopelvic ascites that warrants paracentesis. Also no  significant subcutaneous umbilical fluid collection that warrants aspiration. IMPRESSION: No significant abdominopelvic ascites.  Paracentesis not performed Electronically Signed   By: Jerilynn Mages.  Shick M.D.   On: 02/02/2021 16:12   Labs  Recent Labs    02/10/21 1857 02/11/21 2225 02/12/21 0618  WBC 6.9 6.4 4.9  HGB 10.7* 11.8* 10.7*  HCT 33.7* 36.1 32.6*  PLT 113* 122* 135*   Recent Labs    02/10/21 1857 02/11/21 2225 02/12/21 0618  NA 137 132* 134*  K 5.3* 5.2* 4.9  CL 105 102 102  CO2 24 22 26   BUN 9 13 13   CREATININE 1.25* 1.37* 1.30*  GLUCOSE 88 103* 106*  CALCIUM 9.0 9.0 8.8*   Lab Results  Component Value Date   INR 1.4 (H) 02/12/2021   INR 1.5 (H) 02/02/2021   INR 0.97 06/14/2015        Physical Examination  Patient is a 66 y.o. year old female who is overweight, chronically ill appearing, and in no distress, mood is calm.  Orientation: oriented to person, place, time, and general circumstances  Vital Signs: BP (!) 111/56    Pulse 99    Temp 98.3 F (36.8 C) (Oral)    Resp 15    Ht 5\' 1"  (1.549 m)    Wt 90.7 kg    SpO2 98%    BMI 37.78 kg/m    Gait: Supine on stretcher.  Heart: Normal rate Lungs: Non-labored breathing Abdomen: Soft, Non-tender   Right Upper Extremity: Inspection: Multiple areas of ecchymosis Palpation: Nontender ROM: Full, painless Joint Stability: No instability Strength: Normal Skin: Intact Peripheral Vascular: Well perfused Reflexes: No pathologic Sensation: Intact to light touch distally Lymph Nodes: None Palpable Coordination: Intact, normal   Left Upper Extremity: Inspection: Multiple areas of ecchymosis Palpation: Nontender ROM: Full, painless Joint Stability: No instability Strength: Normal Skin: Intact Peripheral Vascular: Well perfused Reflexes: No pathologic Sensation: Intact to light touch distally Lymph Nodes: None Palpable Coordination: Intact, normal    Right Lower Extremity: Inspection: Atraumatic Palpation:  Nontender ROM: Full, painless Joint Stability: No instability Strength: Normal Skin: Intact Peripheral Vascular: Well perfused, pitting edema Reflexes: No pathologic Sensation: Intact to light touch distally Lymph Nodes: None Palpable Coordination: Intact, normal   Left Lower Extremity: Inspection: Well-healed benign-appearing midline incision over knee.   Palpation: No palpable effusion.  Tender to palpation over distal femur. ROM: Knee range of motion limited due to injury.  Ankle range of motion limited due to effort. Joint Stability: Knee stability deferred due to nondisplaced distal femur periprosthetic fracture. Strength: Able to demonstrate intact dorsiflexion, plantarflexion, and EHL function against manual resistance. Skin: Intact Peripheral Vascular: Well-perfused distally with pitting edema Reflexes: No pathologic Sensation: Intact to light touch in the superficial peroneal, deep peroneal, and tibial distributions Lymph Nodes: None Palpable Coordination: Limited by injury    Pelvis: Skin: Intact Palpation: Nontender Stability: No instability      The review of the patient's medications does not in any way constitute an endorsement, by this clinician,  of their use, dosage, indications, route, efficacy, interactions, or other clinical parameters.  This note was generated within the [employer] EMR using Dragon medical speech recognition software and may contain inherent errors or omissions not intended by the user. Grammatical and punctuation errors, random word insertions, deletions, pronoun errors and incomplete sentences are occasional consequences of this technology due to software limitations. Not all errors are caught or corrected.  Although every attempt is made to root out erroneus and incomplete transcription,  the note may still not fully represent the intent or opinion of the author. If there are questions or concerns about the content of this note or information contained  within the body of this dictation they should be addressed directly with the author for clarification.

## 2021-02-13 MED ORDER — TRAZODONE HCL 100 MG PO TABS
100.0000 mg | ORAL_TABLET | Freq: Every day | ORAL | Status: DC
  Administered 2021-02-13 – 2021-02-14 (×2): 100 mg via ORAL
  Filled 2021-02-13 (×2): qty 1

## 2021-02-13 MED ORDER — SPIRONOLACTONE 25 MG PO TABS
50.0000 mg | ORAL_TABLET | Freq: Two times a day (BID) | ORAL | Status: DC
  Administered 2021-02-13 – 2021-02-15 (×5): 50 mg via ORAL
  Filled 2021-02-13 (×5): qty 2

## 2021-02-13 NOTE — Progress Notes (Signed)
[facility] PROGRESS NOTE    Progress Note  Carla Leonard  ZHG:[id number] DOB: 15-Mar-1955 DOA: 02/11/2021 PCP: Merryl Hacker, No     Brief Narrative:   Carla LYTTLE is an 66 y.o. 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, seizure disorder generalized anxiety disorder and adrenal insufficiency recently seen in the hospital on 02/20/2021 for similar complaint was discharged home with a knee immobilizer and follow-up with orthopedic as an outpatient comes in for after a fall the day prior to admission and with new acute nondisplaced periprosthetic fracture of the left knee knee x-ray showed acute nondisplaced periprosthetic distal femoral metaphysis fracture    Assessment/Plan:   Closed bicondylar fracture of distal femur, left, initial encounter [facility]): Orthopedic surgery was consulted  And recommended nonoperative management, they recommended in the immobilizer appropriately positioned and centered on the knee and hitting her high thigh and lower extremity. Weightbearing as tolerated. Patient relates that she would like to either go to [facility] inpatient rehab or home with physical therapy. If there are no other option she is wanting to go to skilled  Mechanical fall: Physical therapy evaluation is pending.  Hyperkalemia: Resolved with IV fluid hydration.  Hypovolemic hyponatremia: Improving with fluid resuscitation.  Alcoholic cirrhosis/ascites: Continue Aldactone, INR 1.4.  Continue metformin. Appears to be compensated.  Normocytic anemia: MCV mildly elevated likely due to alcohol abuse, hemoglobin appears to be at baseline.  Depression: Continue Cymbalta.  Essential hypertension:  Resume Aldactone  Seizure disorder: Continue Keppra and primodone.  General anxiety disorder: Continue Cymbalta and Xanax as needed.  Tobacco abuse: Counseled.    DVT prophylaxis:  lovenox Family Communication:none Status is: Inpatient Remains inpatient appropriate because: For acute surgical intervention for her left knee    Code Status:     Code Status Orders  (From admission, onward)           Start     Ordered   02/11/21 2205  Full code  Continuous        02/11/21 2204           Code Status History     Date Active Date Inactive Code Status Order ID Comments User Context   02/02/2021 0700 02/05/2021 2018 Full Code [id number]  Norval Morton, MD ED   06/14/2015 2235 06/17/2015 1824 Full Code [id number]  Norval Morton, MD ED         IV Access:   Peripheral IV   Procedures and diagnostic studies:   No results found.   Medical Consultants:   None.   Subjective:    [name] pain is controlled has not had a bowel movement.  Objective:    Vitals:   02/12/21 1841 02/12/21 2153 02/13/21 0257 02/13/21 0440  BP:  112/60 (!) 107/59   Pulse:  80 80   Resp:  16 16   Temp:  98.3 F (36.8 C) 98.8 F (37.1 C)   TempSrc:  Oral Oral   SpO2: 97% 99% 97%   Weight:    (!) 266.9 kg  Height:       SpO2: 97 %   Intake/Output Summary (Last 24 hours) at 02/13/2021 0832 Last data filed at 02/12/2021 1550 Gross per 24 hour  Intake 120 ml  Output --  Net 120 ml   Filed Weights   02/12/21 0727 02/12/21 1410 02/13/21 0440  Weight: 90.7 kg 120.2 kg (!) 266.9 kg    Exam:  General exam: In no acute distress. Respiratory system: Good air movement and clear to auscultation. Cardiovascular system: S1 & S2 heard, RRR. No JVD. Gastrointestinal system: Abdomen is nondistended, soft and nontender.  Extremities: No pedal edema. Skin: No rashes, lesions or ulcers Psychiatry: Judgement and insight appear normal. Mood & affect appropriate.   Data Reviewed:    Labs: Basic Metabolic Panel: Recent Labs  Lab 02/10/21 1857 02/11/21 2225 02/12/21 0618  NA 137 132* 134*  K 5.3* 5.2* 4.9  CL 105 102 102  CO2 24 22 26   GLUCOSE 88  103* 106*  BUN 9 13 13   CREATININE 1.25* 1.37* 1.30*  CALCIUM 9.0 9.0 8.8*  MG  --   --  2.3    GFR Estimated Creatinine Clearance: 93.2 mL/min (A) (by C-G formula based on SCr of 1.3 mg/dL (H)). Liver Function Tests: Recent Labs  Lab 02/12/21 0618  AST 59*  ALT 36  ALKPHOS 136*  BILITOT 1.0  PROT 6.3*  ALBUMIN 2.9*   No results for input(s): LIPASE, AMYLASE in the last 168 hours. No results for input(s): AMMONIA in the last 168 hours. Coagulation profile Recent Labs  Lab 02/12/21 0618  INR 1.4*    COVID-19 Labs  No results for input(s): DDIMER, FERRITIN, LDH, CRP in the last 72 hours.  Lab Results  Component Value Date   SARSCOV2NAA NEGATIVE 02/11/2021   [place] NEGATIVE 02/10/2021   [place] NEGATIVE 02/02/2021    CBC: Recent Labs  Lab 02/10/21 1857 02/11/21 2225 02/12/21 0618  WBC 6.9 6.4 4.9  NEUTROABS 5.8  --  3.8  HGB 10.7* 11.8* 10.7*  HCT 33.7* 36.1 32.6*  MCV 103.4* 103.1* 102.5*  PLT 113* 122* 135*    Cardiac Enzymes: No results for input(s): CKTOTAL, CKMB, CKMBINDEX, TROPONINI in the last 168 hours. BNP (last 3 results) No results for input(s): PROBNP in the last 8760 hours. CBG: No results for input(s): GLUCAP in the last 168 hours. D-Dimer: No results for input(s): DDIMER in the last 72 hours. Hgb A1c: No results for input(s): HGBA1C in the last 72 hours. Lipid Profile: No results for input(s): CHOL, HDL, LDLCALC, TRIG, CHOLHDL, LDLDIRECT in the last 72 hours. Thyroid function studies: No results for input(s): TSH, T4TOTAL, T3FREE, THYROIDAB in the last 72 hours.  Invalid input(s): FREET3 Anemia work up: No results for input(s): VITAMINB12, FOLATE, FERRITIN, TIBC, IRON, RETICCTPCT in the last 72 hours. Sepsis Labs: Recent Labs  Lab 02/10/21 1857 02/11/21 2225 02/12/21 0618  WBC 6.9 6.4 4.9    Microbiology Recent Results (from the past 240 hour(s))  Resp Panel by RT-PCR (Flu A&B, Covid) Nasopharyngeal Swab      Status: None   Collection Time: 02/10/21  6:57 PM   Specimen: Nasopharyngeal Swab; Nasopharyngeal(NP) swabs in vial transport medium  Result Value Ref Range Status   SARS Coronavirus 2 by RT PCR NEGATIVE NEGATIVE Final    Comment: (NOTE) SARS-CoV-2 target nucleic acids are NOT DETECTED.  The SARS-CoV-2 RNA is generally detectable in upper respiratory specimens during the acute phase of infection. The lowest concentration of SARS-CoV-2 viral copies this assay can detect is 138 copies/mL. A negative result does not preclude SARS-Cov-2 infection and should not be used as the sole basis for treatment or other patient management decisions. A negative result may occur with  improper specimen collection/handling, submission of specimen other than nasopharyngeal swab, presence of viral mutation(s) within the areas targeted by this assay, and inadequate number of viral copies(<138 copies/mL). A negative result must  be combined with clinical observations, patient history, and epidemiological information. The expected result is Negative.  Fact Sheet for Patients:  EntrepreneurPulse.com.au  Fact Sheet for Healthcare Providers:  IncredibleEmployment.be  This test is no t yet approved or cleared by the [place] FDA and  has been authorized for detection and/or diagnosis of SARS-CoV-2 by FDA under an Emergency Use Authorization (EUA). This EUA will remain  in effect (meaning this test can be used) for the duration of the COVID-19 declaration under Section 564(b)(1) of the Act, 21 U.S.C.section 360bbb-3(b)(1), unless the authorization is terminated  or revoked sooner.       Influenza A by PCR NEGATIVE NEGATIVE Final   Influenza B by PCR NEGATIVE NEGATIVE Final    Comment: (NOTE) The Xpert Xpress SARS-CoV-2/FLU/RSV plus assay is intended as an aid in the diagnosis of influenza from Nasopharyngeal swab specimens and should not be used as a sole basis  for treatment. Nasal washings and aspirates are unacceptable for Xpert Xpress SARS-CoV-2/FLU/RSV testing.  Fact Sheet for Patients: EntrepreneurPulse.com.au  Fact Sheet for Healthcare Providers: IncredibleEmployment.be  This test is not yet approved or cleared by the [place] FDA and has been authorized for detection and/or diagnosis of SARS-CoV-2 by FDA under an Emergency Use Authorization (EUA). This EUA will remain in effect (meaning this test can be used) for the duration of the COVID-19 declaration under Section 564(b)(1) of the Act, 21 U.S.C. section 360bbb-3(b)(1), unless the authorization is terminated or revoked.  Performed at [facility], [place] [address] [place], [place] [address]   Resp Panel by RT-PCR (Flu A&B, Covid) Nasopharyngeal Swab     Status: None   Collection Time: 02/11/21 10:25 PM   Specimen: Nasopharyngeal Swab; Nasopharyngeal(NP) swabs in vial transport medium  Result Value Ref Range Status   SARS Coronavirus 2 by RT PCR NEGATIVE NEGATIVE Final    Comment: (NOTE) SARS-CoV-2 target nucleic acids are NOT DETECTED.  The SARS-CoV-2 RNA is generally detectable in upper respiratory specimens during the acute phase of infection. The lowest concentration of SARS-CoV-2 viral copies this assay can detect is 138 copies/mL. A negative result does not preclude SARS-Cov-2 infection and should not be used as the sole basis for treatment or other patient management decisions. A negative result may occur with  improper specimen collection/handling, submission of specimen other than nasopharyngeal swab, presence of viral mutation(s) within the areas targeted by this assay, and inadequate number of viral copies(<138 copies/mL). A negative result must be combined with clinical observations, patient history, and epidemiological information. The expected result is Negative.  Fact Sheet for Patients:   EntrepreneurPulse.com.au  Fact Sheet for Healthcare Providers:  IncredibleEmployment.be  This test is no t yet approved or cleared by the [place] FDA and  has been authorized for detection and/or diagnosis of SARS-CoV-2 by FDA under an Emergency Use Authorization (EUA). This EUA will remain  in effect (meaning this test can be used) for the duration of the COVID-19 declaration under Section 564(b)(1) of the Act, 21 U.S.C.section 360bbb-3(b)(1), unless the authorization is terminated  or revoked sooner.       Influenza A by PCR NEGATIVE NEGATIVE Final   Influenza B by PCR NEGATIVE NEGATIVE Final    Comment: (NOTE) The Xpert Xpress SARS-CoV-2/FLU/RSV plus assay is intended as an aid in the diagnosis of influenza from Nasopharyngeal swab specimens and should not be used as a sole basis for treatment. Nasal washings and aspirates are unacceptable for Xpert Xpress SARS-CoV-2/FLU/RSV testing.  Fact Sheet  for Patients: EntrepreneurPulse.com.au  Fact Sheet for Healthcare Providers: IncredibleEmployment.be  This test is not yet approved or cleared by the [place] FDA and has been authorized for detection and/or diagnosis of SARS-CoV-2 by FDA under an Emergency Use Authorization (EUA). This EUA will remain in effect (meaning this test can be used) for the duration of the COVID-19 declaration under Section 564(b)(1) of the Act, 21 U.S.C. section 360bbb-3(b)(1), unless the authorization is terminated or revoked.  Performed at [facility], [place] [address], [place], [place] [address]      Medications:    ALPRAZolam  0.5 mg Oral BID   DULoxetine  20 mg Oral Daily   hydrocortisone  10 mg Oral Daily   hydrocortisone  5 mg Oral QHS   levETIRAcetam  500 mg Oral BID   primidone  100 mg Oral q morning   Continuous Infusions:    LOS: 2 days   Charlynne Cousins  [facility]  02/13/2021, 8:32 AM

## 2021-02-13 NOTE — Progress Notes (Signed)
PATIENT ID: Carla Leonard  MRN: [id number]  DOB/AGE:  April 27, 1955 / 66 y.o.      Subjective: Patient finished breakfast. Pain is mild in left leg.  Reports that she is feeling much better than yesterday. She feels "stronger already" in the brace. No c/o chest pain or SOB.     Objective: Vital signs in last 24 hours: Temp:  [98.3 F (36.8 C)-98.8 F (37.1 C)] 98.8 F (37.1 C) (02/11 0257) Pulse Rate:  [79-100] 80 (02/11 0257) Resp:  [15-20] 16 (02/11 0257) BP: (97-114)/(55-87) 107/59 (02/11 0257) SpO2:  [79 %-100 %] 97 % (02/11 0257) Weight:  [120.2 kg-266.9 kg] 266.9 kg (02/11 0440)  Intake/Output from previous day: 02/10 0701 - 02/11 0700 In: 120 [P.O.:120] Out: -    Recent Labs    02/10/21 1857 02/11/21 2225 02/12/21 0618  HGB 10.7* 11.8* 10.7*   Recent Labs    02/11/21 2225 02/12/21 0618  WBC 6.4 4.9  RBC 3.50* 3.18*  HCT 36.1 32.6*  PLT 122* 135*   Recent Labs    02/11/21 2225 02/12/21 0618  NA 132* 134*  K 5.2* 4.9  CL 102 102  CO2 22 26  BUN 13 13  CREATININE 1.37* 1.30*  GLUCOSE 103* 106*  CALCIUM 9.0 8.8*   Recent Labs    02/12/21 0618  INR 1.4*    Physical Exam: Neurologically intact Sensation intact distally Intact pulses distally Dorsiflexion/Plantar flexion intact Compartment soft Able to perform SLR on LLE  Assessment/Plan: Nondisplaced periprosthetic left distal femoral metaphysis fracture     Advance diet as tolerated Up with therapy Weight Bearing as Tolerated (WBAT) VTE prophylaxis: pharmacologic prophylaxis (with any of the following: Lovenox)  Continue knee immobilizer on LLE. WBAT LLE. Plan for DC to CIR or home with PT at patient request. Patient has appt with Dr Carla Leonard this coming week which she should keep.    Carla Gunnarson L. [place], PA-C 02/13/2021, 8:47 AM

## 2021-02-14 MED ORDER — POLYETHYLENE GLYCOL 3350 17 G PO PACK
17.0000 g | PACK | Freq: Two times a day (BID) | ORAL | Status: DC
  Administered 2021-02-14 – 2021-02-15 (×2): 17 g via ORAL
  Filled 2021-02-14 (×2): qty 1

## 2021-02-14 NOTE — TOC Progression Note (Addendum)
Transition of Care [facility]) - Progression Note    Patient Details  Name: Carla Leonard MRN: [id number] Date of Birth: Aug 14, 1955  Transition of [facility] CM/SW Contact  Ross Ludwig, [place] Phone Number: 02/14/2021, 6:14 PM  Clinical Narrative:   [facility] open to patient for [facility] PT, OT, and ST, will need  HH orders.       Expected Discharge Plan and Services                                                 Social Determinants of Health (SDOH) Interventions    Readmission Risk Interventions No flowsheet data found.

## 2021-02-14 NOTE — Progress Notes (Signed)
[facility] PROGRESS NOTE    Progress Note  Carla Leonard  ZDG:[id number] DOB: May 13, 1955 DOA: 02/11/2021 PCP: Merryl Hacker, No     Brief Narrative:   Carla Leonard is an 66 y.o. 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, seizure disorder generalized anxiety disorder and adrenal insufficiency recently seen in the hospital on 02/20/2021 for similar complaint was discharged home with a knee immobilizer and follow-up with orthopedic as an outpatient comes in for after a fall the day prior to admission and with new acute nondisplaced periprosthetic fracture of the left knee knee x-ray showed acute nondisplaced periprosthetic distal femoral metaphysis fracture.Orthopedic surgery was consulted, recommended nonoperative management, they recommended in the immobilizer appropriately positioned and centered on the knee and hitting her high thigh and lower extremity.    Assessment/Plan:   Closed bicondylar fracture of distal femur, left, initial encounter ([place]): Weightbearing as tolerated. Patient relates that she would like to either go to [facility] inpatient rehab or home with physical therapy. If there are no other option she is wanting to go to skilled Awaiting PT further evaluation. Awaiting TOC recommendations.  Mechanical fall: Physical therapy evaluation is pending.  Hyperkalemia: Resolved with IV fluid hydration.  Hypovolemic hyponatremia: Improving with fluid resuscitation.  Alcoholic cirrhosis/ascites: Continue Aldactone, INR 1.4.  Continue metformin. Appears to be compensated.  Normocytic anemia: MCV mildly elevated likely due to alcohol abuse, hemoglobin appears to be at baseline.  Depression: Continue Cymbalta.  Essential hypertension:  Resume Aldactone  Seizure disorder: Continue Keppra and primodone.  General anxiety disorder: Continue Cymbalta and Xanax as  needed.  Tobacco abuse: Counseled.    DVT prophylaxis: lovenox Family Communication:none Status is: Inpatient Remains inpatient appropriate because: For acute surgical intervention for her left knee    Code Status:     Code Status Orders  (From admission, onward)           Start     Ordered   02/11/21 2205  Full code  Continuous        02/11/21 2204           Code Status History     Date Active Date Inactive Code Status Order ID Comments User Context   02/02/2021 0700 02/05/2021 2018 Full Code [id number]  Norval Morton, MD ED   06/14/2015 2235 06/17/2015 1824 Full Code [id number]  Norval Morton, MD ED         IV Access:   Peripheral IV   Procedures and diagnostic studies:   No results found.   Medical Consultants:   None.   Subjective:    [name] pain is controlled  Objective:    Vitals:   02/13/21 0440 02/13/21 2037 02/14/21 0500 02/14/21 0525  BP:  117/61  (!) 104/58  Pulse:  75  73  Resp:  16  16  Temp:  98.6 F (37 C)  98.1 F (36.7 C)  TempSrc:  Oral  Oral  SpO2:  97%  96%  Weight: (!) 266.9 kg  122.5 kg   Height:       SpO2: 96 %   Intake/Output Summary (Last 24 hours) at 02/14/2021 0918 Last data filed at 02/13/2021 1200 Gross per 24 hour  Intake 480 ml  Output --  Net 480 ml    Filed Weights   02/12/21 1410 02/13/21 0440 02/14/21 0500  Weight: 120.2 kg (!) 266.9 kg 122.5 kg    Exam:  General exam: In no acute distress. Respiratory system: Good air movement and clear to auscultation. Cardiovascular system: S1 & S2 heard, RRR. No JVD. Gastrointestinal system: Abdomen is nondistended, soft and nontender.  Extremities: No pedal edema. Skin: No rashes, lesions or ulcers Psychiatry: Judgement and insight appear normal. Mood & affect appropriate.   Data Reviewed:    Labs: Basic Metabolic Panel: Recent Labs  Lab 02/10/21 1857 02/11/21 2225 02/12/21 0618  NA 137 132* 134*  K 5.3* 5.2* 4.9  CL 105  102 102  CO2 24 22 26   GLUCOSE 88 103* 106*  BUN 9 13 13   CREATININE 1.25* 1.37* 1.30*  CALCIUM 9.0 9.0 8.8*  MG  --   --  2.3    GFR Estimated Creatinine Clearance: 53.9 mL/min (A) (by C-G formula based on SCr of 1.3 mg/dL (H)). Liver Function Tests: Recent Labs  Lab 02/12/21 0618  AST 59*  ALT 36  ALKPHOS 136*  BILITOT 1.0  PROT 6.3*  ALBUMIN 2.9*    No results for input(s): LIPASE, AMYLASE in the last 168 hours. No results for input(s): AMMONIA in the last 168 hours. Coagulation profile Recent Labs  Lab 02/12/21 0618  INR 1.4*    COVID-19 Labs  No results for input(s): DDIMER, FERRITIN, LDH, CRP in the last 72 hours.  Lab Results  Component Value Date   SARSCOV2NAA NEGATIVE 02/11/2021   [place] NEGATIVE 02/10/2021   [place] NEGATIVE 02/02/2021    CBC: Recent Labs  Lab 02/10/21 1857 02/11/21 2225 02/12/21 0618  WBC 6.9 6.4 4.9  NEUTROABS 5.8  --  3.8  HGB 10.7* 11.8* 10.7*  HCT 33.7* 36.1 32.6*  MCV 103.4* 103.1* 102.5*  PLT 113* 122* 135*    Cardiac Enzymes: No results for input(s): CKTOTAL, CKMB, CKMBINDEX, TROPONINI in the last 168 hours. BNP (last 3 results) No results for input(s): PROBNP in the last 8760 hours. CBG: No results for input(s): GLUCAP in the last 168 hours. D-Dimer: No results for input(s): DDIMER in the last 72 hours. Hgb A1c: No results for input(s): HGBA1C in the last 72 hours. Lipid Profile: No results for input(s): CHOL, HDL, LDLCALC, TRIG, CHOLHDL, LDLDIRECT in the last 72 hours. Thyroid function studies: No results for input(s): TSH, T4TOTAL, T3FREE, THYROIDAB in the last 72 hours.  Invalid input(s): FREET3 Anemia work up: No results for input(s): VITAMINB12, FOLATE, FERRITIN, TIBC, IRON, RETICCTPCT in the last 72 hours. Sepsis Labs: Recent Labs  Lab 02/10/21 1857 02/11/21 2225 02/12/21 0618  WBC 6.9 6.4 4.9    Microbiology Recent Results (from the past 240 hour(s))  Resp Panel by RT-PCR (Flu  A&B, Covid) Nasopharyngeal Swab     Status: None   Collection Time: 02/10/21  6:57 PM   Specimen: Nasopharyngeal Swab; Nasopharyngeal(NP) swabs in vial transport medium  Result Value Ref Range Status   SARS Coronavirus 2 by RT PCR NEGATIVE NEGATIVE Final    Comment: (NOTE) SARS-CoV-2 target nucleic acids are NOT DETECTED.  The SARS-CoV-2 RNA is generally detectable in upper respiratory specimens during the acute phase of infection. The lowest concentration of SARS-CoV-2 viral copies this assay can detect is 138 copies/mL. A negative result does not preclude SARS-Cov-2 infection and should not be used as the sole basis for treatment or other patient management decisions. A negative result may occur with  improper specimen collection/handling, submission of specimen other than nasopharyngeal swab, presence of viral mutation(s) within the areas targeted by this assay, and inadequate number of viral copies(<138 copies/mL). A negative result  must be combined with clinical observations, patient history, and epidemiological information. The expected result is Negative.  Fact Sheet for Patients:  EntrepreneurPulse.com.au  Fact Sheet for Healthcare Providers:  IncredibleEmployment.be  This test is no t yet approved or cleared by the [place] FDA and  has been authorized for detection and/or diagnosis of SARS-CoV-2 by FDA under an Emergency Use Authorization (EUA). This EUA will remain  in effect (meaning this test can be used) for the duration of the COVID-19 declaration under Section 564(b)(1) of the Act, 21 U.S.C.section 360bbb-3(b)(1), unless the authorization is terminated  or revoked sooner.       Influenza A by PCR NEGATIVE NEGATIVE Final   Influenza B by PCR NEGATIVE NEGATIVE Final    Comment: (NOTE) The Xpert Xpress SARS-CoV-2/FLU/RSV plus assay is intended as an aid in the diagnosis of influenza from Nasopharyngeal swab specimens  and should not be used as a sole basis for treatment. Nasal washings and aspirates are unacceptable for Xpert Xpress SARS-CoV-2/FLU/RSV testing.  Fact Sheet for Patients: EntrepreneurPulse.com.au  Fact Sheet for Healthcare Providers: IncredibleEmployment.be  This test is not yet approved or cleared by the [place] FDA and has been authorized for detection and/or diagnosis of SARS-CoV-2 by FDA under an Emergency Use Authorization (EUA). This EUA will remain in effect (meaning this test can be used) for the duration of the COVID-19 declaration under Section 564(b)(1) of the Act, 21 U.S.C. section 360bbb-3(b)(1), unless the authorization is terminated or revoked.  Performed at [facility], [place] [address] [place], [place] [address]   Resp Panel by RT-PCR (Flu A&B, Covid) Nasopharyngeal Swab     Status: None   Collection Time: 02/11/21 10:25 PM   Specimen: Nasopharyngeal Swab; Nasopharyngeal(NP) swabs in vial transport medium  Result Value Ref Range Status   SARS Coronavirus 2 by RT PCR NEGATIVE NEGATIVE Final    Comment: (NOTE) SARS-CoV-2 target nucleic acids are NOT DETECTED.  The SARS-CoV-2 RNA is generally detectable in upper respiratory specimens during the acute phase of infection. The lowest concentration of SARS-CoV-2 viral copies this assay can detect is 138 copies/mL. A negative result does not preclude SARS-Cov-2 infection and should not be used as the sole basis for treatment or other patient management decisions. A negative result may occur with  improper specimen collection/handling, submission of specimen other than nasopharyngeal swab, presence of viral mutation(s) within the areas targeted by this assay, and inadequate number of viral copies(<138 copies/mL). A negative result must be combined with clinical observations, patient history, and epidemiological information. The expected result is Negative.  Fact Sheet  for Patients:  EntrepreneurPulse.com.au  Fact Sheet for Healthcare Providers:  IncredibleEmployment.be  This test is no t yet approved or cleared by the [place] FDA and  has been authorized for detection and/or diagnosis of SARS-CoV-2 by FDA under an Emergency Use Authorization (EUA). This EUA will remain  in effect (meaning this test can be used) for the duration of the COVID-19 declaration under Section 564(b)(1) of the Act, 21 U.S.C.section 360bbb-3(b)(1), unless the authorization is terminated  or revoked sooner.       Influenza A by PCR NEGATIVE NEGATIVE Final   Influenza B by PCR NEGATIVE NEGATIVE Final    Comment: (NOTE) The Xpert Xpress SARS-CoV-2/FLU/RSV plus assay is intended as an aid in the diagnosis of influenza from Nasopharyngeal swab specimens and should not be used as a sole basis for treatment. Nasal washings and aspirates are unacceptable for Xpert Xpress SARS-CoV-2/FLU/RSV testing.  Fact  Sheet for Patients: EntrepreneurPulse.com.au  Fact Sheet for Healthcare Providers: IncredibleEmployment.be  This test is not yet approved or cleared by the [place] FDA and has been authorized for detection and/or diagnosis of SARS-CoV-2 by FDA under an Emergency Use Authorization (EUA). This EUA will remain in effect (meaning this test can be used) for the duration of the COVID-19 declaration under Section 564(b)(1) of the Act, 21 U.S.C. section 360bbb-3(b)(1), unless the authorization is terminated or revoked.  Performed at [facility], [place] [address] [place], [place] [address]      Medications:    ALPRAZolam  0.5 mg Oral BID   DULoxetine  20 mg Oral Daily   hydrocortisone  10 mg Oral Daily   hydrocortisone  5 mg Oral QHS   levETIRAcetam  500 mg Oral BID   primidone  100 mg Oral q morning   spironolactone  50 mg Oral BID   traZODone  100 mg Oral QHS    Continuous Infusions:    LOS: 3 days   Charlynne Cousins  [facility]  02/14/2021, 9:18 AM

## 2021-02-14 NOTE — Progress Notes (Signed)
° ° °  Patient doing well with knee immobilizer, she feels very confident and able to D/C home. Good progress in PT. Pain well controlled. Eating and drinking well with NL B/B control.    Physical Exam:BP (!) 104/58 (BP Location: Left Arm)    Pulse 73    Temp 98.1 F (36.7 C) (Oral)    Resp 16    Ht 5\' 2"  (1.575 m)    Wt 122.5 kg    SpO2 96%    BMI 49.38 kg/m    Knee immobilizer in place, pt resting comfortably, soft distal compartments, 2+ DPP = BIL NVI   Non-displaced periprosthetic fx L distal femoral metaphysis. Non-op tx plan with knee immobilizer - WBAT in knee immobilizer LLE - up with PT/OT, encourage ambulation - VTE prophylaxis lovenox  - likely d/c home with PT per primary service - F/U scheduled with Dr Berenice Primas next week as scheduled

## 2021-02-14 NOTE — Evaluation (Signed)
Physical Therapy Evaluation Patient Details Name: Carla Leonard MRN: [id number] DOB: 1955/11/18 Today's Date: 02/14/2021  History of Present Illness  Carla Leonard is a 66 y.o. female initially presents to ED via EMS for evaluation after mechanical fall 2/8, diagnosed with a left distal femur periprosthetic fracture above total knee implant from 2013.  She was recommended nonoperative treatment with weightbearing as tolerated in the knee immobilizer and discharged home.  Unfortunately she was unable to mobilize or care for herself at home and she returned to the ED on 2/9. PMH: HTN, arthritis, depression, and recent admission for abdominal wall cellulitis, bil TKA   Clinical Impression  Pt admitted with above diagnosis. Pt ind at baseline, intermittently using SPC or rollator, daughter and neighbors assist with household chores if needed but typically independent. Pt currently powering to stand and ambulating 200 ft with RW with LLE WBAT and KI on with mobility. Pt able to slide KI up and reposition with cues from therapist. Pt denies pain with all mobility, taking equal bil steps and no unsteadiness noted. Pt educated on ascending/descending single step to enter home and able to verbalize sequencing, states she is familiar from previous knee surgeries. Pt motivated to return home with family/friend support. Educated pt on benefit of RW versus rollator and pt verbalizes understanding. Pt currently with functional limitations due to the deficits listed below (see PT Problem List). Pt will benefit from skilled PT to increase their independence and safety with mobility to allow discharge to the venue listed below.          Recommendations for follow up therapy are one component of a multi-disciplinary discharge planning process, led by the attending physician.  Recommendations may be updated based on patient status, additional functional criteria and insurance authorization.  Follow Up  Recommendations Home health PT    Assistance Recommended at Discharge Intermittent Supervision/Assistance  Patient can return home with the following  A little help with walking and/or transfers;A little help with bathing/dressing/bathroom;Assistance with cooking/housework;Assist for transportation;Help with stairs or ramp for entrance    Equipment Recommendations Rolling walker (2 wheels)  Recommendations for Other Services       Functional Status Assessment Patient has had a recent decline in their functional status and demonstrates the ability to make significant improvements in function in a reasonable and predictable amount of time.     Precautions / Restrictions Precautions Precautions: Fall Required Braces or Orthoses: Knee Immobilizer - Left Knee Immobilizer - Left: On at all times Restrictions Weight Bearing Restrictions: No Other Position/Activity Restrictions: WBAT LLE      Mobility  Bed Mobility Overal bed mobility: Modified Independent   Transfers Overall transfer level: Modified independent Equipment used: Rolling walker (2 wheels)  General transfer comment: BUE assisting to power to stand    Ambulation/Gait Ambulation/Gait assistance: Supervision Gait Distance (Feet): 200 Feet Assistive device: Rolling walker (2 wheels) Gait Pattern/deviations: Step-through pattern, Decreased stride length Gait velocity: slightly decreased  General Gait Details: step through pattern with L KI on, equal bil step length and weight-bearing, denies pain in LLE with weight-bearing, no unsteadiness or LOB  Stairs            Wheelchair Mobility    Modified Rankin (Stroke Patients Only)       Balance Overall balance assessment: Needs assistance Sitting-balance support: Feet supported Sitting balance-Leahy Scale: Good  Standing balance support: During functional activity Standing balance-Leahy Scale: Fair Standing balance comment: able to release BUE to don/doff  face mask and  reposition hospital gown, RW for ambulation       Pertinent Vitals/Pain Pain Assessment Pain Assessment: No/denies pain    Home Living Family/patient expects to be discharged to:: Private residence Living Arrangements: Alone Available Help at Discharge: Family;Friend(s);Available PRN/intermittently Type of Home: Apartment Home Access: Stairs to enter Entrance Stairs-Rails: Right Entrance Stairs-Number of Steps: 1   Home Layout: One level Home Equipment: Cane - single point;Rollator (4 wheels)      Prior Function Prior Level of Function : Independent/Modified Independent  Mobility Comments: pt reports ind with household and community ambulation, SPC and rollator occassionally; hasn't been ambulatory since previous hospitalization ADLs Comments: pt reports ind with ADLs/IADLs, sponge baths, daughter assists with household chores as needed     Hand Dominance   Dominant Hand: Right    Extremity/Trunk Assessment   Upper Extremity Assessment Upper Extremity Assessment: Overall WFL for tasks assessed    Lower Extremity Assessment Lower Extremity Assessment: Overall WFL for tasks assessed;LLE deficits/detail;RLE deficits/detail RLE Deficits / Details: AROM WNL, strength 4/5 throughout, denies numbness/tingling RLE Sensation: WNL RLE Coordination: WNL LLE Deficits / Details: KI on, ankle and hip AROM WNL LLE Sensation: WNL LLE Coordination: WNL       Communication   Communication: No difficulties  Cognition Arousal/Alertness: Awake/alert Behavior During Therapy: WFL for tasks assessed/performed Overall Cognitive Status: Within Functional Limits for tasks assessed  General Comments: pt a&o x4, tangential and easily distracted but able to be redirected        General Comments      Exercises     Assessment/Plan    PT Assessment Patient needs continued PT services  PT Problem List Decreased strength;Decreased range of motion;Decreased activity  tolerance;Decreased balance;Obesity;Cardiopulmonary status limiting activity       PT Treatment Interventions DME instruction;Gait training;Stair training;Functional mobility training;Therapeutic activities;Therapeutic exercise;Balance training;Patient/family education    PT Goals (Current goals can be found in the Care Plan section)  Acute Rehab PT Goals Patient Stated Goal: return home and regain independence PT Goal Formulation: With patient Time For Goal Achievement: 02/28/21 Potential to Achieve Goals: Good    Frequency       Co-evaluation               AM-PAC PT "6 Clicks" Mobility  Outcome Measure Help needed turning from your back to your side while in a flat bed without using bedrails?: None Help needed moving from lying on your back to sitting on the side of a flat bed without using bedrails?: None Help needed moving to and from a bed to a chair (including a wheelchair)?: None Help needed standing up from a chair using your arms (e.g., wheelchair or bedside chair)?: None Help needed to walk in hospital room?: A Little Help needed climbing 3-5 steps with a railing? : A Little 6 Click Score: 22    End of Session Equipment Utilized During Treatment: Gait belt Activity Tolerance: Patient tolerated treatment well Patient left: in chair;with call bell/phone within reach Nurse Communication: Mobility status PT Visit Diagnosis: Unsteadiness on feet (R26.81);Muscle weakness (generalized) (M62.81)    Time: 7062-3762 PT Time Calculation (min) (ACUTE ONLY): 22 min   Charges:   PT Evaluation $PT Eval Low Complexity: 1 Low           Tori Deniss Wormley PT, DPT 02/14/21, 10:32 AM

## 2021-02-15 NOTE — Discharge Summary (Addendum)
Physician Discharge Summary  TECLA MAILLOUX HFW:[id number] DOB: 03/01/1955 DOA: 02/11/2021  PCP: Merryl Hacker, No  Admit date: 02/11/2021 Discharge date: 02/15/2021  Admitted From: Home Disposition:  Home  Recommendations for Outpatient Follow-up:  Follow up with PCP in 1-2 weeks Please obtain BMP/CBC in one week   Home Health:Yes Equipment/Devices:none  Discharge Condition:Stable CODE STATUS:Full Diet recommendation: Heart Healthy  Brief/Interim Summary:  66 y.o. 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, seizure disorder generalized anxiety disorder and adrenal insufficiency recently seen in the hospital on 02/20/2021 for similar complaint was discharged home with a knee immobilizer and follow-up with orthopedic as an outpatient comes in for after a fall the day prior to admission and with new acute nondisplaced periprosthetic fracture of the left knee knee x-ray showed acute nondisplaced periprosthetic distal femoral metaphysis fracture.Orthopedic surgery was consulted, recommended nonoperative management, they recommended in the immobilizer appropriately positioned and centered on the knee and hitting her high thigh and lower extremity.  Discharge Diagnoses:  Principal Problem:   Closed bicondylar fracture of distal femur, left, initial encounter [facility]) Active Problems:   Essential hypertension   Anxiety and depression   Seizure disorder (HCC)   Adrenal insufficiency ([place])   Fall at home, initial encounter  Closed bicondylar fracture of the distal femur on the left: X-ray was done that showed a periprosthetic fracture. Orthopedic surgery was consulted recommended nonoperative management. They recommended weightbearing as tolerated. Physical therapy evaluated the patient recommended home health PT.  Mechanical fall: Physical therapy evaluated the patient she will go home with home health  PT.  Hyperkalemia: Resolved with IV fluid resuscitation.  Hypovolemic hyponatremia: Resolved with IV fluid resuscitation.  Alcoholic cirrhosis/ascites: No changes made to her medication continue Aldactone and metformin her INR was 1.4.  Normocytic anemia: Likely due to alcohol abuse her hemoglobin has remained stable no signs of overt bleeding.  Depression: Continue Cymbalta.  Essential hypertension: No changes made continue Aldactone.  Seizure disorder: Continue current home regimen no changes made.  General anxiety disorder: Continue Cymbalta and Xanax as needed.  Discharge Instructions  Discharge Instructions     Diet - low sodium heart healthy   Complete by: As directed    Increase activity slowly   Complete by: As directed       Allergies as of 02/15/2021       Reactions   Sulfa Antibiotics Anaphylaxis   Prednisone Other (See Comments)   hallucinations   Nsaids Other (See Comments)   Stomach pain, diarrhea   Other Other (See Comments)   Steroids: hallucinations        Medication List     STOP taking these medications    amoxicillin-clavulanate 875-125 MG tablet Commonly known as: AUGMENTIN   cephALEXin 500 MG capsule Commonly known as: KEFLEX       TAKE these medications    ALPRAZolam 1 MG tablet Commonly known as: XANAX Take 0.5 mg by mouth See admin instructions. Take 1/2 tablet (0.5 mg) by mouth every morning and at bedtime (with trazodone); may also take 1/2 tablet (0.5 mg) twice daily as needed for anxiety   budesonide-formoterol 160-4.5 MCG/ACT inhaler Commonly known as: SYMBICORT Inhale 2 puffs into the lungs 2 (two) times daily as needed (shortness of breath/wheezing).   HYDROcodone-acetaminophen 5-325 MG tablet Commonly known as: NORCO/VICODIN Take 1-2 tablets by mouth every 4 (four) hours as needed for severe pain.   hydrocortisone 5 MG tablet Commonly known as:  CORTEF Take 2 tablets (10mg ) every morning and take 1 tablet  (5mg ) every night.   levETIRAcetam 500 MG tablet Commonly known as: KEPPRA Take 500 mg by mouth 2 (two) times daily.   primidone 50 MG tablet Commonly known as: MYSOLINE Take 100 mg by mouth every morning.   promethazine 12.5 MG tablet Commonly known as: PHENERGAN Take 12.5 mg by mouth every 4 (four) hours as needed for nausea or vomiting.   spironolactone 50 MG tablet Commonly known as: ALDACTONE Take 50 mg by mouth 2 (two) times daily.   traZODone 100 MG tablet Commonly known as: DESYREL Take 100 mg by mouth at bedtime. Take with 0.5 mg alprazolam        Allergies  Allergen Reactions   Sulfa Antibiotics Anaphylaxis   Prednisone Other (See Comments)    hallucinations   Nsaids Other (See Comments)    Stomach pain, diarrhea   Other Other (See Comments)    Steroids: hallucinations    Consultations: Orthopedic surgery   Procedures/Studies: CT Lumbar Spine Wo Contrast  Result Date: 02/10/2021 CLINICAL DATA:  Back trauma.  Fall EXAM: CT LUMBAR SPINE WITHOUT CONTRAST TECHNIQUE: Multidetector CT imaging of the lumbar spine was performed without intravenous contrast administration. Multiplanar CT image reconstructions were also generated. RADIATION DOSE REDUCTION: This exam was performed according to the departmental dose-optimization program which includes automated exposure control, adjustment of the mA and/or kV according to patient size and/or use of iterative reconstruction technique. COMPARISON:  Lumbar MRI 11/26/2016 FINDINGS: Segmentation: 5 lumbar vertebra Alignment: Mild anterolisthesis L4-5 Vertebrae: Negative for fracture or mass in the lumbar spine. Lower thoracic spine included to the T11 level. Paraspinal and other soft tissues: Negative for paraspinous mass or adenopathy Ascites around the liver and in the pelvis. Small liver with nodular contour compatible with cirrhosis. Probable renal artery calcification in the left renal hilum. No hydronephrosis. Disc levels:  T12-L1: Negative L1-2: Mild disc bulging without stenosis. L2-3: Disc degeneration with gas in the disc space. Diffuse disc bulging and bilateral facet degeneration. Mild subarticular stenosis bilaterally L3-4: Diffuse bulging of the disc and moderate facet degeneration. Chronic left foraminal disc protrusion unchanged from the prior study. Mild spinal stenosis. Moderate subarticular and foraminal stenosis on the left. L4-5: Mild anterolisthesis. Diffuse disc bulging and advanced facet degeneration. Moderate to severe spinal stenosis. Moderate to severe subarticular and foraminal stenosis bilaterally left greater than right L5-S1: Advanced facet degeneration. Moderate subarticular and foraminal stenosis bilaterally. IMPRESSION: 1. Negative for lumbar fracture 2. Multilevel lumbar degenerative change causing spinal and foraminal stenosis most severe at L4-5 3. Cirrhosis liver with ascites. Electronically Signed   By: Franchot Gallo M.D.   On: 02/10/2021 18:54   CT Knee Left Wo Contrast  Result Date: 02/10/2021 CLINICAL DATA:  Left knee pain after fall. EXAM: CT OF THE LEFT KNEE WITHOUT CONTRAST TECHNIQUE: Multidetector CT imaging of the left knee was performed according to the standard protocol. Multiplanar CT image reconstructions were also generated. RADIATION DOSE REDUCTION: This exam was performed according to the departmental dose-optimization program which includes automated exposure control, adjustment of the mA and/or kV according to patient size and/or use of iterative reconstruction technique. COMPARISON:  Left knee x-rays from same day. FINDINGS: Bones/Joint/Cartilage Prior total knee arthroplasty. No evidence of hardware failure or loosening. Acute nondisplaced periprosthetic fracture of the distal femoral metaphysis (series 5, image 57). No dislocation. Small to moderate lipohemarthrosis. Osteopenia. Ligaments Ligaments are suboptimally evaluated by CT. Muscles and Tendons Grossly intact. Soft tissue  No  fluid collection or hematoma.  No soft tissue mass. IMPRESSION: 1. Prior total knee arthroplasty with acute nondisplaced periprosthetic fracture of the distal femoral metaphysis. 2. Small to moderate lipohemarthrosis. Electronically Signed   By: Titus Dubin M.D.   On: 02/10/2021 19:03   CT Knee Right Wo Contrast  Result Date: 02/10/2021 CLINICAL DATA:  Right knee pain after fall. EXAM: CT OF THE RIGHT KNEE WITHOUT CONTRAST TECHNIQUE: Multidetector CT imaging of the right knee was performed according to the standard protocol. Multiplanar CT image reconstructions were also generated. RADIATION DOSE REDUCTION: This exam was performed according to the departmental dose-optimization program which includes automated exposure control, adjustment of the mA and/or kV according to patient size and/or use of iterative reconstruction technique. COMPARISON:  Right knee x-rays from same day. FINDINGS: Bones/Joint/Cartilage Prior total knee arthroplasty. No evidence of hardware failure or loosening. No fracture or dislocation. Osteopenia. Trace joint effusion. Ligaments Ligaments are suboptimally evaluated by CT. Muscles and Tendons Grossly intact. Soft tissue No fluid collection or hematoma.  No soft tissue mass. IMPRESSION: 1. No acute osseous abnormality. 2. Prior total knee arthroplasty without evidence of hardware complication. Electronically Signed   By: Titus Dubin M.D.   On: 02/10/2021 18:58   CT ABDOMEN PELVIS W CONTRAST  Result Date: 02/01/2021 CLINICAL DATA:  Acute abdominal pain. Wound drainage below belly button. EXAM: CT ABDOMEN AND PELVIS WITH CONTRAST TECHNIQUE: Multidetector CT imaging of the abdomen and pelvis was performed using the standard protocol following bolus administration of intravenous contrast. RADIATION DOSE REDUCTION: This exam was performed according to the departmental dose-optimization program which includes automated exposure control, adjustment of the mA and/or kV according to  patient size and/or use of iterative reconstruction technique. CONTRAST:  136mL OMNIPAQUE IOHEXOL 300 MG/ML  SOLN COMPARISON:  CT abdomen and pelvis 01/08/2021. FINDINGS: Lower chest: No acute abnormality. Hepatobiliary: Patient is status post cholecystectomy. There is mild intra and extrahepatic biliary ductal dilatation which appears similar to the prior study. No focal liver lesions are identified. Pancreas: Unremarkable. No pancreatic ductal dilatation or surrounding inflammatory changes. Spleen: Mildly enlarged, unchanged. Adrenals/Urinary Tract: There are rounded hypodensities in both kidneys which are too small to characterize, likely cyst. Otherwise, the kidneys, adrenal glands and bladder are within normal limits. Stomach/Bowel: Stomach is within normal limits. Appendix appears normal. No evidence of bowel wall thickening, distention, or inflammatory changes. The appendix is not visualized. Vascular/Lymphatic: Aortic atherosclerosis. No enlarged abdominal or pelvic lymph nodes. Prominent pericardial lymph node measures 7 mm. Reproductive: Uterus and bilateral adnexa are unremarkable. Other: There is a small amount of free fluid in the pelvis. Ascites has significantly decreased. Minimal fluid tracking along the bilateral pericolic gutters. Central mesenteric haziness. No focal body wall edema. There is anterior body wall subcutaneous stranding and skin thickening. There is a small enhancing fluid collection in the subcutaneous tissues the level of the inferior umbilicus measuring 3.1 by 0.9 x 2.2 cm. No evidence for soft tissue gas. Musculoskeletal: Multilevel degenerative changes affect the spine. IMPRESSION: 1. Small enhancing fluid collections in the subcutaneous tissues at the level of the umbilicus measuring 3.1 x 0.9 x 2.2 cm. Abscess not excluded. No evidence for soft tissue gas. 2. Anterior abdominal wall subcutaneous stranding and skin thickening may be related to cellulitis. 3. Significant  decrease in ascites.  Mild ascites persists. 4. Stable splenomegaly. 5.  Aortic Atherosclerosis (ICD10-I70.0). Electronically Signed   By: Ronney Asters M.D.   On: 02/01/2021 20:34   DG Knee Complete 4 Views Left  Result Date: 02/10/2021 CLINICAL DATA:  Fall EXAM: LEFT KNEE - COMPLETE 4+ VIEW COMPARISON:  None. FINDINGS: Status post knee replacement. Acute nondisplaced periprosthetic fracture medial side of distal femur at the metaphysis. Positive for knee effusion. IMPRESSION: Knee replacement with acute nondisplaced periprosthetic distal femoral metaphyseal fracture. Electronically Signed   By: Donavan Foil M.D.   On: 02/10/2021 17:26   DG Knee Complete 4 Views Right  Result Date: 02/10/2021 CLINICAL DATA:  Mechanical fall EXAM: RIGHT KNEE - COMPLETE 4+ VIEW COMPARISON:  06/14/2015 FINDINGS: Status post knee replacement. No malalignment. No sizeable effusion. Questionable nondisplaced cortical fracture at the fibular head. IMPRESSION: 1. Post knee replacement. Questionable cortical fracture at the fibular head, correlate for point tenderness. Electronically Signed   By: Donavan Foil M.D.   On: 02/10/2021 17:28   IR ABDOMEN US LIMITED  Result Date: 02/02/2021 CLINICAL DATA:  Abdominal distension, assess for ascites EXAM: LIMITED ABDOMEN ULTRASOUND FOR ASCITES TECHNIQUE: Limited ultrasound survey for ascites was performed in all four abdominal quadrants. COMPARISON:  02/01/2021 CT FINDINGS: Ultrasound of the abdominal 4 quadrants demonstrates no significant abdominopelvic ascites that warrants paracentesis. Also no significant subcutaneous umbilical fluid collection that warrants aspiration. IMPRESSION: No significant abdominopelvic ascites.  Paracentesis not performed Electronically Signed   By: Jerilynn Mages.  Shick M.D.   On: 02/02/2021 16:12   (Echo, Carotid, EGD, Colonoscopy, ERCP)    Subjective:  No complaint Discharge Exam: Vitals:   02/14/21 2018 02/15/21 0319  BP: 127/72 117/67  Pulse: 76 68   Resp: 20 20  Temp: 98.2 F (36.8 C) 97.7 F (36.5 C)  SpO2: 100% 98%   Vitals:   02/14/21 1439 02/14/21 2018 02/15/21 0102 02/15/21 0319  BP: 128/65 127/72  117/67  Pulse: 72 76  68  Resp: 18 20  20   Temp: 97.9 F (36.6 C) 98.2 F (36.8 C)  97.7 F (36.5 C)  TempSrc: Oral Oral  Oral  SpO2: 98% 100%  98%  Weight:  125.1 kg 125.1 kg   Height:        General: Pt is alert, awake, not in acute distress Cardiovascular: RRR, S1/S2 +, no rubs, no gallops Respiratory: CTA bilaterally, no wheezing, no rhonchi Abdominal: Soft, NT, ND, bowel sounds + Extremities: no edema, no cyanosis    The results of significant diagnostics from this hospitalization (including imaging, microbiology, ancillary and laboratory) are listed below for reference.     Microbiology: Recent Results (from the past 240 hour(s))  Resp Panel by RT-PCR (Flu A&B, Covid) Nasopharyngeal Swab     Status: None   Collection Time: 02/10/21  6:57 PM   Specimen: Nasopharyngeal Swab; Nasopharyngeal(NP) swabs in vial transport medium  Result Value Ref Range Status   SARS Coronavirus 2 by RT PCR NEGATIVE NEGATIVE Final    Comment: (NOTE) SARS-CoV-2 target nucleic acids are NOT DETECTED.  The SARS-CoV-2 RNA is generally detectable in upper respiratory specimens during the acute phase of infection. The lowest concentration of SARS-CoV-2 viral copies this assay can detect is 138 copies/mL. A negative result does not preclude SARS-Cov-2 infection and should not be used as the sole basis for treatment or other patient management decisions. A negative result may occur with  improper specimen collection/handling, submission of specimen other than nasopharyngeal swab, presence of viral mutation(s) within the areas targeted by this assay, and inadequate number of viral copies(<138 copies/mL). A negative result must be combined with clinical observations, patient history, and epidemiological information. The expected result  is Negative.  Fact Sheet for  Patients:  EntrepreneurPulse.com.au  Fact Sheet for Healthcare Providers:  IncredibleEmployment.be  This test is no t yet approved or cleared by the [place] FDA and  has been authorized for detection and/or diagnosis of SARS-CoV-2 by FDA under an Emergency Use Authorization (EUA). This EUA will remain  in effect (meaning this test can be used) for the duration of the COVID-19 declaration under Section 564(b)(1) of the Act, 21 U.S.C.section 360bbb-3(b)(1), unless the authorization is terminated  or revoked sooner.       Influenza A by PCR NEGATIVE NEGATIVE Final   Influenza B by PCR NEGATIVE NEGATIVE Final    Comment: (NOTE) The Xpert Xpress SARS-CoV-2/FLU/RSV plus assay is intended as an aid in the diagnosis of influenza from Nasopharyngeal swab specimens and should not be used as a sole basis for treatment. Nasal washings and aspirates are unacceptable for Xpert Xpress SARS-CoV-2/FLU/RSV testing.  Fact Sheet for Patients: EntrepreneurPulse.com.au  Fact Sheet for Healthcare Providers: IncredibleEmployment.be  This test is not yet approved or cleared by the [place] FDA and has been authorized for detection and/or diagnosis of SARS-CoV-2 by FDA under an Emergency Use Authorization (EUA). This EUA will remain in effect (meaning this test can be used) for the duration of the COVID-19 declaration under Section 564(b)(1) of the Act, 21 U.S.C. section 360bbb-3(b)(1), unless the authorization is terminated or revoked.  Performed at [facility], [place] [address], [place], [place] [address]   Resp Panel by RT-PCR (Flu A&B, Covid) Nasopharyngeal Swab     Status: None   Collection Time: 02/11/21 10:25 PM   Specimen: Nasopharyngeal Swab; Nasopharyngeal(NP) swabs in vial transport medium  Result Value Ref Range Status   SARS Coronavirus 2 by RT PCR NEGATIVE NEGATIVE  Final    Comment: (NOTE) SARS-CoV-2 target nucleic acids are NOT DETECTED.  The SARS-CoV-2 RNA is generally detectable in upper respiratory specimens during the acute phase of infection. The lowest concentration of SARS-CoV-2 viral copies this assay can detect is 138 copies/mL. A negative result does not preclude SARS-Cov-2 infection and should not be used as the sole basis for treatment or other patient management decisions. A negative result may occur with  improper specimen collection/handling, submission of specimen other than nasopharyngeal swab, presence of viral mutation(s) within the areas targeted by this assay, and inadequate number of viral copies(<138 copies/mL). A negative result must be combined with clinical observations, patient history, and epidemiological information. The expected result is Negative.  Fact Sheet for Patients:  EntrepreneurPulse.com.au  Fact Sheet for Healthcare Providers:  IncredibleEmployment.be  This test is no t yet approved or cleared by the [place] FDA and  has been authorized for detection and/or diagnosis of SARS-CoV-2 by FDA under an Emergency Use Authorization (EUA). This EUA will remain  in effect (meaning this test can be used) for the duration of the COVID-19 declaration under Section 564(b)(1) of the Act, 21 U.S.C.section 360bbb-3(b)(1), unless the authorization is terminated  or revoked sooner.       Influenza A by PCR NEGATIVE NEGATIVE Final   Influenza B by PCR NEGATIVE NEGATIVE Final    Comment: (NOTE) The Xpert Xpress SARS-CoV-2/FLU/RSV plus assay is intended as an aid in the diagnosis of influenza from Nasopharyngeal swab specimens and should not be used as a sole basis for treatment. Nasal washings and aspirates are unacceptable for Xpert Xpress SARS-CoV-2/FLU/RSV testing.  Fact Sheet for Patients: EntrepreneurPulse.com.au  Fact Sheet for Healthcare  Providers: IncredibleEmployment.be  This test is not yet approved or cleared  by the [place] and has been authorized for detection and/or diagnosis of SARS-CoV-2 by FDA under an Emergency Use Authorization (EUA). This EUA will remain in effect (meaning this test can be used) for the duration of the COVID-19 declaration under Section 564(b)(1) of the Act, 21 U.S.C. section 360bbb-3(b)(1), unless the authorization is terminated or revoked.  Performed at [facility], [place] [address], [place], [place] [address]      Labs: BNP (last 3 results) Recent Labs    11/13/20 1836  BNP 453.6*   Basic Metabolic Panel: Recent Labs  Lab 02/10/21 1857 02/11/21 2225 02/12/21 0618  NA 137 132* 134*  K 5.3* 5.2* 4.9  CL 105 102 102  CO2 24 22 26   GLUCOSE 88 103* 106*  BUN 9 13 13   CREATININE 1.25* 1.37* 1.30*  CALCIUM 9.0 9.0 8.8*  MG  --   --  2.3   Liver Function Tests: Recent Labs  Lab 02/12/21 0618  AST 59*  ALT 36  ALKPHOS 136*  BILITOT 1.0  PROT 6.3*  ALBUMIN 2.9*   No results for input(s): LIPASE, AMYLASE in the last 168 hours. No results for input(s): AMMONIA in the last 168 hours. CBC: Recent Labs  Lab 02/10/21 1857 02/11/21 2225 02/12/21 0618  WBC 6.9 6.4 4.9  NEUTROABS 5.8  --  3.8  HGB 10.7* 11.8* 10.7*  HCT 33.7* 36.1 32.6*  MCV 103.4* 103.1* 102.5*  PLT 113* 122* 135*   Cardiac Enzymes: No results for input(s): CKTOTAL, CKMB, CKMBINDEX, TROPONINI in the last 168 hours. BNP: Invalid input(s): POCBNP CBG: No results for input(s): GLUCAP in the last 168 hours. D-Dimer No results for input(s): DDIMER in the last 72 hours. Hgb A1c No results for input(s): HGBA1C in the last 72 hours. Lipid Profile No results for input(s): CHOL, HDL, LDLCALC, TRIG, CHOLHDL, LDLDIRECT in the last 72 hours. Thyroid function studies No results for input(s): TSH, T4TOTAL, T3FREE, THYROIDAB in the last 72 hours.  Invalid  input(s): FREET3 Anemia work up No results for input(s): VITAMINB12, FOLATE, FERRITIN, TIBC, IRON, RETICCTPCT in the last 72 hours. Urinalysis    Component Value Date/Time   COLORURINE YELLOW 02/01/2021 2337   APPEARANCEUR CLOUDY (A) 02/01/2021 2337   LABSPEC <1.005 (L) 02/01/2021 2337   PHURINE 6.5 02/01/2021 2337   GLUCOSEU NEGATIVE 02/01/2021 2337   HGBUR TRACE (A) 02/01/2021 2337   BILIRUBINUR NEGATIVE 02/01/2021 [place] 02/01/2021 2337   PROTEINUR NEGATIVE 02/01/2021 2337   UROBILINOGEN 0.2 06/17/2011 0636   NITRITE NEGATIVE 02/01/2021 2337   LEUKOCYTESUR TRACE (A) 02/01/2021 2337   Sepsis Labs Invalid input(s): PROCALCITONIN,  WBC,  LACTICIDVEN Microbiology Recent Results (from the past 240 hour(s))  Resp Panel by RT-PCR (Flu A&B, Covid) Nasopharyngeal Swab     Status: None   Collection Time: 02/10/21  6:57 PM   Specimen: Nasopharyngeal Swab; Nasopharyngeal(NP) swabs in vial transport medium  Result Value Ref Range Status   SARS Coronavirus 2 by RT PCR NEGATIVE NEGATIVE Final    Comment: (NOTE) SARS-CoV-2 target nucleic acids are NOT DETECTED.  The SARS-CoV-2 RNA is generally detectable in upper respiratory specimens during the acute phase of infection. The lowest concentration of SARS-CoV-2 viral copies this assay can detect is 138 copies/mL. A negative result does not preclude SARS-Cov-2 infection and should not be used as the sole basis for treatment or other patient management decisions. A negative result may occur with  improper specimen collection/handling, submission of specimen other than nasopharyngeal  swab, presence of viral mutation(s) within the areas targeted by this assay, and inadequate number of viral copies(<138 copies/mL). A negative result must be combined with clinical observations, patient history, and epidemiological information. The expected result is Negative.  Fact Sheet for Patients:   EntrepreneurPulse.com.au  Fact Sheet for Healthcare Providers:  IncredibleEmployment.be  This test is no t yet approved or cleared by the [place] FDA and  has been authorized for detection and/or diagnosis of SARS-CoV-2 by FDA under an Emergency Use Authorization (EUA). This EUA will remain  in effect (meaning this test can be used) for the duration of the COVID-19 declaration under Section 564(b)(1) of the Act, 21 U.S.C.section 360bbb-3(b)(1), unless the authorization is terminated  or revoked sooner.       Influenza A by PCR NEGATIVE NEGATIVE Final   Influenza B by PCR NEGATIVE NEGATIVE Final    Comment: (NOTE) The Xpert Xpress SARS-CoV-2/FLU/RSV plus assay is intended as an aid in the diagnosis of influenza from Nasopharyngeal swab specimens and should not be used as a sole basis for treatment. Nasal washings and aspirates are unacceptable for Xpert Xpress SARS-CoV-2/FLU/RSV testing.  Fact Sheet for Patients: EntrepreneurPulse.com.au  Fact Sheet for Healthcare Providers: IncredibleEmployment.be  This test is not yet approved or cleared by the [place] FDA and has been authorized for detection and/or diagnosis of SARS-CoV-2 by FDA under an Emergency Use Authorization (EUA). This EUA will remain in effect (meaning this test can be used) for the duration of the COVID-19 declaration under Section 564(b)(1) of the Act, 21 U.S.C. section 360bbb-3(b)(1), unless the authorization is terminated or revoked.  Performed at [facility], [place] [address], [place], [place] [address]   Resp Panel by RT-PCR (Flu A&B, Covid) Nasopharyngeal Swab     Status: None   Collection Time: 02/11/21 10:25 PM   Specimen: Nasopharyngeal Swab; Nasopharyngeal(NP) swabs in vial transport medium  Result Value Ref Range Status   SARS Coronavirus 2 by RT PCR NEGATIVE NEGATIVE Final    Comment: (NOTE) SARS-CoV-2  target nucleic acids are NOT DETECTED.  The SARS-CoV-2 RNA is generally detectable in upper respiratory specimens during the acute phase of infection. The lowest concentration of SARS-CoV-2 viral copies this assay can detect is 138 copies/mL. A negative result does not preclude SARS-Cov-2 infection and should not be used as the sole basis for treatment or other patient management decisions. A negative result may occur with  improper specimen collection/handling, submission of specimen other than nasopharyngeal swab, presence of viral mutation(s) within the areas targeted by this assay, and inadequate number of viral copies(<138 copies/mL). A negative result must be combined with clinical observations, patient history, and epidemiological information. The expected result is Negative.  Fact Sheet for Patients:  EntrepreneurPulse.com.au  Fact Sheet for Healthcare Providers:  IncredibleEmployment.be  This test is no t yet approved or cleared by the [place] FDA and  has been authorized for detection and/or diagnosis of SARS-CoV-2 by FDA under an Emergency Use Authorization (EUA). This EUA will remain  in effect (meaning this test can be used) for the duration of the COVID-19 declaration under Section 564(b)(1) of the Act, 21 U.S.C.section 360bbb-3(b)(1), unless the authorization is terminated  or revoked sooner.       Influenza A by PCR NEGATIVE NEGATIVE Final   Influenza B by PCR NEGATIVE NEGATIVE Final    Comment: (NOTE) The Xpert Xpress SARS-CoV-2/FLU/RSV plus assay is intended as an aid in the diagnosis of influenza from Nasopharyngeal swab specimens and should  not be used as a sole basis for treatment. Nasal washings and aspirates are unacceptable for Xpert Xpress SARS-CoV-2/FLU/RSV testing.  Fact Sheet for Patients: EntrepreneurPulse.com.au  Fact Sheet for Healthcare  Providers: IncredibleEmployment.be  This test is not yet approved or cleared by the [place] FDA and has been authorized for detection and/or diagnosis of SARS-CoV-2 by FDA under an Emergency Use Authorization (EUA). This EUA will remain in effect (meaning this test can be used) for the duration of the COVID-19 declaration under Section 564(b)(1) of the Act, 21 U.S.C. section 360bbb-3(b)(1), unless the authorization is terminated or revoked.  Performed at [facility], [place] [address] [place], [place] [address]     SIGNED:   Charlynne Cousins, MD  [facility] 02/15/2021, 12:01 PM Pager   If 7PM-7AM, please contact night-coverage [email or web address] Password [email or web address]

## 2021-02-15 NOTE — TOC Transition Note (Signed)
Transition of Care [facility]) - CM/SW Discharge Note   Patient Details  Name: Carla Leonard MRN: [id number] Date of Birth: 1955-11-25  Transition of Care [facility]) CM/SW Contact:  Ross Ludwig, LCSW Phone Number: 02/15/2021, 8:03 PM   Clinical Narrative:    CSW spoke to patient and she was requesting a grab bar and a tub bench.  CSW contacted [employer], and they said those pieces of equipment are not covered by insurance and she would probably find it cheaper at a store in the community.  CSW attempted to contact patient and let her know, however phone went to voice mail.  CSW left message awaiting call back.  Patient will be receiving home health PT, OT, and RN services from [facility], patient is requesting [facility] social worker as well for community resources.  CSW spoke to [place] and gave her the referral.  [place] signing off, patient discharging back home.   Final next level of care: [place] Barriers to Discharge: Barriers Resolved   Patient Goals and CMS Choice Patient states their goals for this hospitalization and ongoing recovery are:: To retur back home with [facility]. CMS Medicare.gov Compare Post Acute Care list provided to:: Patient Choice offered to / list presented to : Patient  Discharge Placement                       Discharge Plan and Services                          HH Arranged: OT, PT, Social Work, RN [facility] Agency: [place] Date [facility] Agency Contacted: 02/15/21 Time [place]: 1200 Representative spoke with at [place]: [place] ([place]) Interventions     Readmission Risk Interventions No flowsheet data found.

## 2021-02-15 NOTE — Care Management Important Message (Signed)
Important Message  Patient Details IM Letter given to the Patient. Name: Carla Leonard MRN: [id number] Date of Birth: 04/23/1955   Medicare Important Message Given:  Yes     Kerin Salen 02/15/2021, 12:23 PM

## 2021-02-18 DIAGNOSIS — Z96653 Presence of artificial knee joint, bilateral: Secondary | ICD-10-CM | POA: Diagnosis not present

## 2021-02-18 DIAGNOSIS — M9712XD Periprosthetic fracture around internal prosthetic left knee joint, subsequent encounter: Secondary | ICD-10-CM | POA: Diagnosis not present

## 2021-02-18 DIAGNOSIS — S72402D Unspecified fracture of lower end of left femur, subsequent encounter for closed fracture with routine healing: Secondary | ICD-10-CM | POA: Diagnosis not present

## 2021-02-25 DIAGNOSIS — M25562 Pain in left knee: Secondary | ICD-10-CM | POA: Diagnosis not present

## 2021-03-01 ENCOUNTER — Other Ambulatory Visit: Payer: Self-pay | Admitting: *Deleted

## 2021-03-02 DIAGNOSIS — M47816 Spondylosis without myelopathy or radiculopathy, lumbar region: Secondary | ICD-10-CM | POA: Diagnosis not present

## 2021-03-02 DIAGNOSIS — R6889 Other general symptoms and signs: Secondary | ICD-10-CM | POA: Diagnosis not present

## 2021-03-03 ENCOUNTER — Ambulatory Visit: Payer: Medicare HMO | Admitting: Internal Medicine

## 2021-03-04 DIAGNOSIS — G40909 Epilepsy, unspecified, not intractable, without status epilepticus: Secondary | ICD-10-CM | POA: Diagnosis not present

## 2021-03-04 DIAGNOSIS — F17210 Nicotine dependence, cigarettes, uncomplicated: Secondary | ICD-10-CM | POA: Diagnosis not present

## 2021-03-04 DIAGNOSIS — F411 Generalized anxiety disorder: Secondary | ICD-10-CM | POA: Diagnosis not present

## 2021-03-04 DIAGNOSIS — R296 Repeated falls: Secondary | ICD-10-CM | POA: Diagnosis not present

## 2021-03-04 DIAGNOSIS — M9712XD Periprosthetic fracture around internal prosthetic left knee joint, subsequent encounter: Secondary | ICD-10-CM | POA: Diagnosis not present

## 2021-03-04 DIAGNOSIS — E2740 Unspecified adrenocortical insufficiency: Secondary | ICD-10-CM | POA: Diagnosis not present

## 2021-03-04 DIAGNOSIS — I509 Heart failure, unspecified: Secondary | ICD-10-CM | POA: Diagnosis not present

## 2021-03-04 DIAGNOSIS — Z79899 Other long term (current) drug therapy: Secondary | ICD-10-CM | POA: Diagnosis not present

## 2021-03-05 ENCOUNTER — Ambulatory Visit: Payer: Medicare HMO | Admitting: Internal Medicine

## 2021-03-09 DIAGNOSIS — G40909 Epilepsy, unspecified, not intractable, without status epilepticus: Secondary | ICD-10-CM | POA: Diagnosis not present

## 2021-03-09 DIAGNOSIS — F1011 Alcohol abuse, in remission: Secondary | ICD-10-CM | POA: Diagnosis not present

## 2021-03-09 DIAGNOSIS — I10 Essential (primary) hypertension: Secondary | ICD-10-CM | POA: Diagnosis not present

## 2021-03-09 DIAGNOSIS — K7030 Alcoholic cirrhosis of liver without ascites: Secondary | ICD-10-CM | POA: Diagnosis not present

## 2021-03-09 DIAGNOSIS — E2740 Unspecified adrenocortical insufficiency: Secondary | ICD-10-CM | POA: Diagnosis not present

## 2021-03-09 DIAGNOSIS — I110 Hypertensive heart disease with heart failure: Secondary | ICD-10-CM | POA: Diagnosis not present

## 2021-03-09 DIAGNOSIS — F411 Generalized anxiety disorder: Secondary | ICD-10-CM | POA: Diagnosis not present

## 2021-03-09 DIAGNOSIS — I509 Heart failure, unspecified: Secondary | ICD-10-CM | POA: Diagnosis not present

## 2021-03-09 DIAGNOSIS — R54 Age-related physical debility: Secondary | ICD-10-CM | POA: Diagnosis not present

## 2021-03-25 DIAGNOSIS — M25562 Pain in left knee: Secondary | ICD-10-CM | POA: Diagnosis not present

## 2021-03-25 DIAGNOSIS — R6889 Other general symptoms and signs: Secondary | ICD-10-CM | POA: Diagnosis not present

## 2021-03-29 ENCOUNTER — Ambulatory Visit (INDEPENDENT_AMBULATORY_CARE_PROVIDER_SITE_OTHER): Payer: Medicare HMO | Admitting: Internal Medicine

## 2021-03-29 ENCOUNTER — Encounter: Payer: Self-pay | Admitting: Internal Medicine

## 2021-03-29 ENCOUNTER — Other Ambulatory Visit (INDEPENDENT_AMBULATORY_CARE_PROVIDER_SITE_OTHER): Payer: Medicare HMO

## 2021-03-29 VITALS — BP 120/74 | HR 79 | Ht 62.0 in | Wt 234.2 lb

## 2021-03-29 DIAGNOSIS — R7989 Other specified abnormal findings of blood chemistry: Secondary | ICD-10-CM | POA: Diagnosis not present

## 2021-03-29 DIAGNOSIS — R188 Other ascites: Secondary | ICD-10-CM

## 2021-03-29 DIAGNOSIS — D649 Anemia, unspecified: Secondary | ICD-10-CM | POA: Diagnosis not present

## 2021-03-29 DIAGNOSIS — Z1211 Encounter for screening for malignant neoplasm of colon: Secondary | ICD-10-CM

## 2021-03-29 DIAGNOSIS — R6889 Other general symptoms and signs: Secondary | ICD-10-CM | POA: Diagnosis not present

## 2021-03-29 DIAGNOSIS — Z1159 Encounter for screening for other viral diseases: Secondary | ICD-10-CM | POA: Diagnosis not present

## 2021-03-29 DIAGNOSIS — R198 Other specified symptoms and signs involving the digestive system and abdomen: Secondary | ICD-10-CM

## 2021-03-29 DIAGNOSIS — L02211 Cutaneous abscess of abdominal wall: Secondary | ICD-10-CM

## 2021-03-29 NOTE — Patient Instructions (Addendum)
Your provider has requested that you go to the basement level for lab work before leaving today. Press "B" on the elevator. The lab is located at the first door on the left as you exit the elevator.  ? ?We have put in a referral for you to Wound Care ? ?Your provider has ordered Cologuard testing as an option for colon cancer screening. This is performed by [employer] and may be out of network with your insurance. PRIOR to completing the test, it is YOUR responsibility to contact your insurance about covered benefits for this test. Your out of pocket expense could be anywhere from $0.00 to $649.00.  ? ?When you call to check coverage with your insurer, please provide the following information:  ? ?-The ONLY provider of Cologuard is [place]  ?- CPT code for Cologuard is [phone].  ?-[employer] NPI # [id number]  ?-[employer] Tax ID # [id number]  ? ? If you have not heard from them within the next week, please call our office at [phone]. ?  ?You have been scheduled for a CT scan of the abdomen and pelvis at [facility], 1st floor Radiology. You are scheduled on 04/06/2021  at 8:30am. You should arrive 30 minutes prior to your appointment time for registration.  Please pick up 2 bottles of contrast from [place] at least 3 days prior to your scan. The solution may taste better if refrigerated, but do NOT add ice or any other liquid to this solution. Shake well before drinking.  ? ?Please follow the written instructions below on the day of your exam:  ? ?1) Do not eat anything after 4:30am (4 hours prior to your test)  ? ?2) Drink 1 bottle of contrast @ 6:30am (2 hours prior to your exam)  Remember to shake well before drinking and do NOT pour over ice. ?    Drink 1 bottle of contrast @ 7:30am m   (1 hour prior to your exam)  ? ?You may take any medications as prescribed with a small amount of water, if necessary. If you take any of the following medications:  METFORMIN, GLUCOPHAGE, GLUCOVANCE, AVANDAMET, RIOMET, FORTAMET, ACTOPLUS MET, JANUMET, [place] or METAGLIP, you MAY be asked to HOLD this medication 48 hours AFTER the exam.  ? ?The purpose of you drinking the oral contrast is to aid in the visualization of your intestinal tract. The contrast solution may cause some diarrhea. Depending on your individual set of symptoms, you may also receive an intravenous injection of x-ray contrast/dye. Plan on being at [facility] for 45 minutes or longer, depending on the type of exam you are having performed.  ? ?If you have any questions regarding your exam or if you need to reschedule, you may call [facility] at [phone] between the hours of 8:00 am and 5:00 pm, Monday-Friday.  ? ? ?Due to recent changes in healthcare laws, you may see the results of your imaging and laboratory studies on MyChart before your provider has had a chance to review them.  We understand that in some cases there may be results that are confusing or concerning to you. Not all laboratory results come back in the same time frame and the provider may be waiting for multiple results in order to interpret others.  Please give [place] 48 hours in order for your provider to thoroughly review all the results before contacting the office for clarification of your results.   ? ?I appreciate the  opportunity to care for you ? ?Thank You  ? ?Sonny Masters Dorsey,MD ?  ?

## 2021-03-29 NOTE — Addendum Note (Signed)
Addended by: Oda Kilts on: 03/29/2021 01:20 PM ? ? Modules accepted: Orders ? ?

## 2021-03-29 NOTE — Progress Notes (Signed)
? ?Chief Complaint: Abdominal wall abscess ? ?HPI : 66 year old female with history of obesity, seizures, thrombocytopenia, seizures, adrenal insufficiency presents for hospital follow-up after a diagnosis of abdominal wall abscess and cellulitis. ? ?Patient was admitted 1/30-02/05/21 for abdominal wall cellulitis and abscess for which she was treated with antibiotics.  She was discharged to complete a few days of oral antibiotics.  Patient states that the previously noted abdominal wall redness and pain has improved since her hospitalization.  However this morning she did note scant amounts of bleeding from her bellybutton.  Although she was diagnosed with adrenal insufficiency previously, she is no longer taking hydrocortisone.  She was recently admitted again 2/9-2/13/23 for a fall resulting in a new nondisplaced periprosthetic distal femoral metaphysis fracture.  Orthopedic surgery evaluated her and recommended nonoperative management.  At this time patient is mostly concerned about making sure that her abdominal wall infection does not recur.  She denies any worsening abdominal distention or lower extremity edema.  She does have some memory issues.  Denies seeing any blood in her stools.  Denies abdominal pain.  She has not been drinking any alcohol.  She is overall concerned about her general health since she feels like she has been a steady decline over the last 1.5 years. ? ? ?Past Medical History:  ?Diagnosis Date  ? Arthritis   ? Depression   ? History of kidney stones   ? Hypertension   ? ? ? ?Past Surgical History:  ?Procedure Laterality Date  ? APPENDECTOMY    ? CHOLECYSTECTOMY    ? ELBOW SURGERY    ? RIGHT  ? JOINT REPLACEMENT    ? right TKA 06/2010  ? KNEE ARTHROPLASTY  06/17/2011  ? Procedure: COMPUTER ASSISTED TOTAL KNEE ARTHROPLASTY;  Surgeon: Alta Corning, MD;  Location: [place];  Service: Orthopedics;  Laterality: Left;  TOTAL KNEE REPLACEMENT WITH GENERAL ANESTHESIA AND PRE OP FEMORAL NERVE BLOCK  ?  PERIANAL CYST    ? ?Family History  ?Problem Relation Age of Onset  ? Pneumonia Mother   ? Diabetes Father   ? Colon cancer Neg Hx   ? Esophageal cancer Neg Hx   ? Stomach cancer Neg Hx   ? Pancreatic cancer Neg Hx   ? Colon polyps Neg Hx   ? ?Social History  ? ?Tobacco Use  ? Smoking status: Every Day  ?  Packs/day: 0.25  ?  Types: Cigarettes  ? Tobacco comments:  ?  Former 2 ppd  ?Vaping Use  ? Vaping Use: Never used  ?Substance Use Topics  ? Alcohol use: Yes  ? Drug use: No  ? ?Current Outpatient Medications  ?Medication Sig Dispense Refill  ? ALPRAZolam (XANAX) 0.5 MG tablet Take by mouth.    ? ALPRAZolam (XANAX) 1 MG tablet Take 0.5 mg by mouth See admin instructions. Take 1/2 tablet (0.5 mg) by mouth every morning and at bedtime (with trazodone); may also take 1/2 tablet (0.5 mg) twice daily as needed for anxiety    ? budesonide-formoterol (SYMBICORT) 160-4.5 MCG/ACT inhaler Inhale 2 puffs into the lungs 2 (two) times daily as needed (shortness of breath/wheezing).    ? furosemide (LASIX) 20 MG tablet 1 tab as needed    ? levETIRAcetam (KEPPRA) 500 MG tablet Take 500 mg by mouth 2 (two) times daily.    ? primidone (MYSOLINE) 50 MG tablet Take 100 mg by mouth every morning.    ? promethazine (PHENERGAN) 12.5 MG tablet Take 12.5 mg by mouth every 4 (  four) hours as needed for nausea or vomiting.    ? traMADol (ULTRAM) 50 MG tablet Take by mouth.    ? traZODone (DESYREL) 100 MG tablet Take 100 mg by mouth at bedtime. Take with 0.5 mg alprazolam    ? hydrocortisone (CORTEF) 5 MG tablet Take 2 tablets (42m) every morning and take 1 tablet (572m every night. (Patient not taking: Reported on 03/29/2021) 90 tablet 2  ? ?No current facility-administered medications for this visit.  ? ?Allergies  ?Allergen Reactions  ? Sulfa Antibiotics Anaphylaxis  ? Prednisone Other (See Comments)  ?  hallucinations  ? Nsaids Other (See Comments)  ?  Stomach pain, diarrhea  ? Other Other (See Comments)  ?  Steroids: hallucinations   ? ? ? ?Review of Systems: ?All systems reviewed and negative except where noted in HPI.  ? ?Physical Exam: ?BP 120/74   Pulse 79   Ht 5' 2"  (1.575 m)   Wt 234 lb 3.2 oz (106.2 kg)   SpO2 99%   BMI 42.84 kg/m?  ?Constitutional: Pleasant,well-developed, female in no acute distress. ?HEENT: Normocephalic and atraumatic. Conjunctivae are normal. No scleral icterus. ?Cardiovascular: Normal rate, regular rhythm.  ?Pulmonary/chest: Effort normal and breath sounds normal. No wheezing, rales or rhonchi. ?Abdominal: Soft, distended, non-tender. Umbilicus with slight clear drainage and crusted blood seen on the outer edges. No active bleeding was noted from the umbilicus at this time. A lesion was noted in the middle of the umbilicus, which may represent the previous site of wound drainage. ?Extremities: 2+ BLE edema ?Neurological: Alert and oriented to person place and time. ?Skin: Skin is warm and dry. No rashes noted. ?Psychiatric: Normal mood and affect. Behavior is normal. ? ?Labs 02/2021: CMP with elevated CR of 1.3, AST 59, and alk phos 136. CBC with low Hb of 10.7, MCV 102.5, low plts 135. INR is elevated 1.4.  ? ?CT A/P w/contrast 02/01/21: ?IMPRESSION: ?1. Small enhancing fluid collections in the subcutaneous tissues at ?the level of the umbilicus measuring 3.1 x 0.9 x 2.2 cm. Abscess not ?excluded. No evidence for soft tissue gas. ?2. Anterior abdominal wall subcutaneous stranding and skin ?thickening may be related to cellulitis. ?3. Significant decrease in ascites.  Mild ascites persists. ?4. Stable splenomegaly. ?5.  Aortic Atherosclerosis (ICD10-I70.0). ? ?ASSESSMENT AND PLAN: ?Ascites ?Umbilical abscess and cellulitis ?Prior alcohol use ?Elevated LFTs ?Anemia ?Colon cancer screening ?I discussed with the patient whether or not to pursue colonoscopy for colon cancer screening, and she currently declines to proceed with colonoscopy due to ongoing health issues and concerns about being able to complete a bowel  prep regimen.  Patient had a positive FOBT while hospitalized, but patient would like to pursue Cologuard to see if she may not need colon cancer screening.  At this time I am not convinced that her anemia is due to iron deficiency anemia so we will pursue basic anemia work-up.  Patient does have elevated LFTs as well as ascites and splenomegaly that were noted on her most recent CT scan.  We will plan to pursue basic work-up to rule out common causes of elevated LFTs.  Patient does have history of significant alcohol use, which could have led to some alcoholic liver disease.  Her splenomegaly and low platelets could be suggestive of underlying cirrhosis.  Will obtain CT A/P to see if her abdominal wall abscess has improved with antibiotic therapy. Patient describes some recent bleeding from the umbilicus so will have wound care see her to help with education  about how to care for the umbilicus. Patient does have some bilateral lower extremity edema as well as possible ascites, but she has also had issues with hypotension in the past so I would be hesitant to start diuretics at this time. ?- Check acute hepatitis panel, ANA, ASMA, IgG ?- Check ferritin, iron/TIBC, vitamin B12, folate ?- Cologuard ?- CT A/P w/contrast ?- Referral to wound care to help with care of her umbilicus ?- RTC in 2 months. Can discuss possible HCC screening and EGD for esophageal varices screening if there is still concern for possible underlying cirrhosis. ? ?Christia Reading, MD ? ?I spent 44 minutes of time, including in depth chart review, independent review of results as outlined above, communicating results with the patient directly, face-to-face time with the patient, coordinating care, ordering studies and medications as appropriate, and documentation. ? ? ?

## 2021-03-30 LAB — IBC + FERRITIN
Ferritin: 398.7 ng/mL — ABNORMAL HIGH (ref 10.0–291.0)
Iron: 94 ug/dL (ref 42–145)
Saturation Ratios: 59.9 % — ABNORMAL HIGH (ref 20.0–50.0)
TIBC: 156.8 ug/dL — ABNORMAL LOW (ref 250.0–450.0)
Transferrin: 112 mg/dL — ABNORMAL LOW (ref 212.0–360.0)

## 2021-03-30 LAB — FOLATE: Folate: 7.9 ng/mL (ref >5.9)

## 2021-03-30 LAB — VITAMIN B12: Vitamin B-12: 578 pg/mL (ref 211–911)

## 2021-04-02 LAB — MITOCHONDRIAL ANTIBODIES: Mitochondrial M2 Ab, IgG: <=20 U (ref <=20.0)

## 2021-04-02 LAB — IGG: IgG (Immunoglobin G), Serum: 1385 mg/dL (ref 600–1540)

## 2021-04-02 LAB — ANTI-SMOOTH MUSCLE ANTIBODY, IGG: Actin (Smooth Muscle) Antibody (IGG): <20 U (ref <20)

## 2021-04-02 LAB — ANA: Anti Nuclear Antibody (ANA): NEGATIVE

## 2021-04-02 LAB — HEPATITIS B SURFACE ANTIBODY,QUALITATIVE: Hep B S Ab: NONREACTIVE

## 2021-04-02 LAB — HEPATITIS B SURFACE ANTIGEN: Hepatitis B Surface Ag: NONREACTIVE

## 2021-04-02 LAB — HEPATITIS A ANTIBODY, TOTAL: Hepatitis A AB,Total: REACTIVE — AB

## 2021-04-02 LAB — HEPATITIS C ANTIBODY
Hepatitis C Ab: NONREACTIVE
SIGNAL TO CUT-OFF: 0.03 (ref <1.00)

## 2021-04-04 NOTE — Progress Notes (Signed)
Hi Ammie, please let the patient know that her work up shows that she is immune against hepatitis A (either that she has had the infection in the past or has been immunized). Her iron levels are slightly high, which can sometimes suggest iron overload or that she has generalized signs of inflammation in the body. I will need to check another lab called the [facility] in order to see if she truly has hereditary iron overload.  She can come in to get this lab drawn or I can obtain the lab during her next GI clinic appointment. Her other labs were normal.

## 2021-04-05 ENCOUNTER — Other Ambulatory Visit: Payer: Self-pay

## 2021-04-06 ENCOUNTER — Inpatient Hospital Stay (HOSPITAL_COMMUNITY)
Admission: RE | Admit: 2021-04-06 | Discharge: 2021-04-06 | Disposition: A | Discharge status: Home / Self Care | Payer: Medicare HMO | Source: Ambulatory Visit | Attending: Internal Medicine | Admitting: Internal Medicine

## 2021-04-06 DIAGNOSIS — R7989 Other specified abnormal findings of blood chemistry: Secondary | ICD-10-CM | POA: Insufficient documentation

## 2021-04-06 DIAGNOSIS — Z6841 Body Mass Index (BMI) 40.0 and over, adult: Secondary | ICD-10-CM | POA: Diagnosis not present

## 2021-04-06 DIAGNOSIS — N183 Chronic kidney disease, stage 3 unspecified: Secondary | ICD-10-CM | POA: Diagnosis present

## 2021-04-06 DIAGNOSIS — I959 Hypotension, unspecified: Secondary | ICD-10-CM | POA: Diagnosis not present

## 2021-04-06 DIAGNOSIS — R6889 Other general symptoms and signs: Secondary | ICD-10-CM | POA: Diagnosis not present

## 2021-04-06 DIAGNOSIS — R0609 Other forms of dyspnea: Secondary | ICD-10-CM | POA: Diagnosis not present

## 2021-04-06 DIAGNOSIS — R609 Edema, unspecified: Secondary | ICD-10-CM | POA: Diagnosis not present

## 2021-04-06 DIAGNOSIS — K7290 Hepatic failure, unspecified without coma: Secondary | ICD-10-CM | POA: Diagnosis not present

## 2021-04-06 DIAGNOSIS — Z87442 Personal history of urinary calculi: Secondary | ICD-10-CM | POA: Diagnosis not present

## 2021-04-06 DIAGNOSIS — L02211 Cutaneous abscess of abdominal wall: Secondary | ICD-10-CM | POA: Insufficient documentation

## 2021-04-06 DIAGNOSIS — E8809 Other disorders of plasma-protein metabolism, not elsewhere classified: Secondary | ICD-10-CM | POA: Diagnosis not present

## 2021-04-06 DIAGNOSIS — N200 Calculus of kidney: Secondary | ICD-10-CM | POA: Diagnosis not present

## 2021-04-06 DIAGNOSIS — F32A Depression, unspecified: Secondary | ICD-10-CM | POA: Diagnosis present

## 2021-04-06 DIAGNOSIS — K7031 Alcoholic cirrhosis of liver with ascites: Secondary | ICD-10-CM | POA: Diagnosis not present

## 2021-04-06 DIAGNOSIS — K7030 Alcoholic cirrhosis of liver without ascites: Secondary | ICD-10-CM | POA: Diagnosis not present

## 2021-04-06 DIAGNOSIS — K429 Umbilical hernia without obstruction or gangrene: Secondary | ICD-10-CM | POA: Diagnosis not present

## 2021-04-06 DIAGNOSIS — R9431 Abnormal electrocardiogram [ECG] [EKG]: Secondary | ICD-10-CM | POA: Diagnosis not present

## 2021-04-06 DIAGNOSIS — R140 Abdominal distension (gaseous): Secondary | ICD-10-CM | POA: Diagnosis not present

## 2021-04-06 DIAGNOSIS — E876 Hypokalemia: Secondary | ICD-10-CM | POA: Diagnosis not present

## 2021-04-06 DIAGNOSIS — Z20822 Contact with and (suspected) exposure to covid-19: Secondary | ICD-10-CM | POA: Diagnosis not present

## 2021-04-06 DIAGNOSIS — E860 Dehydration: Secondary | ICD-10-CM | POA: Diagnosis present

## 2021-04-06 DIAGNOSIS — K7469 Other cirrhosis of liver: Secondary | ICD-10-CM | POA: Diagnosis not present

## 2021-04-06 DIAGNOSIS — Y92009 Unspecified place in unspecified non-institutional (private) residence as the place of occurrence of the external cause: Secondary | ICD-10-CM | POA: Diagnosis not present

## 2021-04-06 DIAGNOSIS — K7460 Unspecified cirrhosis of liver: Secondary | ICD-10-CM | POA: Diagnosis not present

## 2021-04-06 DIAGNOSIS — S72402D Unspecified fracture of lower end of left femur, subsequent encounter for closed fracture with routine healing: Secondary | ICD-10-CM | POA: Diagnosis not present

## 2021-04-06 DIAGNOSIS — Z881 Allergy status to other antibiotic agents status: Secondary | ICD-10-CM | POA: Diagnosis not present

## 2021-04-06 DIAGNOSIS — E2740 Unspecified adrenocortical insufficiency: Secondary | ICD-10-CM | POA: Diagnosis not present

## 2021-04-06 DIAGNOSIS — X58XXXA Exposure to other specified factors, initial encounter: Secondary | ICD-10-CM | POA: Diagnosis present

## 2021-04-06 DIAGNOSIS — D539 Nutritional anemia, unspecified: Secondary | ICD-10-CM | POA: Diagnosis not present

## 2021-04-06 DIAGNOSIS — Z743 Need for continuous supervision: Secondary | ICD-10-CM | POA: Diagnosis not present

## 2021-04-06 DIAGNOSIS — R188 Other ascites: Secondary | ICD-10-CM | POA: Diagnosis not present

## 2021-04-06 DIAGNOSIS — D696 Thrombocytopenia, unspecified: Secondary | ICD-10-CM | POA: Diagnosis not present

## 2021-04-06 DIAGNOSIS — K632 Fistula of intestine: Secondary | ICD-10-CM | POA: Diagnosis present

## 2021-04-06 DIAGNOSIS — T380X6A Underdosing of glucocorticoids and synthetic analogues, initial encounter: Secondary | ICD-10-CM | POA: Diagnosis present

## 2021-04-06 DIAGNOSIS — D6959 Other secondary thrombocytopenia: Secondary | ICD-10-CM | POA: Diagnosis not present

## 2021-04-06 DIAGNOSIS — Z9049 Acquired absence of other specified parts of digestive tract: Secondary | ICD-10-CM | POA: Diagnosis not present

## 2021-04-06 DIAGNOSIS — I129 Hypertensive chronic kidney disease with stage 1 through stage 4 chronic kidney disease, or unspecified chronic kidney disease: Secondary | ICD-10-CM | POA: Diagnosis present

## 2021-04-06 DIAGNOSIS — Z888 Allergy status to other drugs, medicaments and biological substances status: Secondary | ICD-10-CM | POA: Diagnosis not present

## 2021-04-06 DIAGNOSIS — Z0389 Encounter for observation for other suspected diseases and conditions ruled out: Secondary | ICD-10-CM | POA: Diagnosis not present

## 2021-04-06 DIAGNOSIS — G40909 Epilepsy, unspecified, not intractable, without status epilepticus: Secondary | ICD-10-CM | POA: Diagnosis present

## 2021-04-06 LAB — POCT I-STAT CREATININE: Creatinine, Ser: 1.2 mg/dL — ABNORMAL HIGH (ref 0.44–1.00)

## 2021-04-06 MED ORDER — IOHEXOL 300 MG/ML  SOLN
100.0000 mL | Freq: Once | INTRAMUSCULAR | Status: AC | PRN
  Administered 2021-04-06: 100 mL via INTRAVENOUS

## 2021-04-06 MED ORDER — SODIUM CHLORIDE (PF) 0.9 % IJ SOLN
INTRAMUSCULAR | Status: AC
  Filled 2021-04-06: qty 50

## 2021-04-07 ENCOUNTER — Telehealth: Payer: Self-pay | Admitting: Internal Medicine

## 2021-04-07 ENCOUNTER — Other Ambulatory Visit: Payer: Self-pay

## 2021-04-07 ENCOUNTER — Emergency Department (HOSPITAL_COMMUNITY): Payer: Medicare HMO

## 2021-04-07 ENCOUNTER — Inpatient Hospital Stay (HOSPITAL_COMMUNITY)
Admission: EM | Admit: 2021-04-07 | Discharge: 2021-04-12 | DRG: 433 | Disposition: A | Discharge status: Home / Self Care | Payer: Medicare HMO | Attending: Internal Medicine | Admitting: Internal Medicine

## 2021-04-07 ENCOUNTER — Encounter (HOSPITAL_COMMUNITY): Payer: Self-pay

## 2021-04-07 DIAGNOSIS — Z6841 Body Mass Index (BMI) 40.0 and over, adult: Secondary | ICD-10-CM | POA: Diagnosis not present

## 2021-04-07 DIAGNOSIS — D539 Nutritional anemia, unspecified: Secondary | ICD-10-CM | POA: Diagnosis present

## 2021-04-07 DIAGNOSIS — R7989 Other specified abnormal findings of blood chemistry: Secondary | ICD-10-CM | POA: Diagnosis present

## 2021-04-07 DIAGNOSIS — I10 Essential (primary) hypertension: Secondary | ICD-10-CM | POA: Diagnosis present

## 2021-04-07 DIAGNOSIS — Z881 Allergy status to other antibiotic agents status: Secondary | ICD-10-CM | POA: Diagnosis not present

## 2021-04-07 DIAGNOSIS — D6959 Other secondary thrombocytopenia: Secondary | ICD-10-CM | POA: Diagnosis present

## 2021-04-07 DIAGNOSIS — X58XXXA Exposure to other specified factors, initial encounter: Secondary | ICD-10-CM | POA: Diagnosis present

## 2021-04-07 DIAGNOSIS — L02211 Cutaneous abscess of abdominal wall: Secondary | ICD-10-CM | POA: Diagnosis present

## 2021-04-07 DIAGNOSIS — S72402D Unspecified fracture of lower end of left femur, subsequent encounter for closed fracture with routine healing: Secondary | ICD-10-CM

## 2021-04-07 DIAGNOSIS — R609 Edema, unspecified: Secondary | ICD-10-CM | POA: Diagnosis not present

## 2021-04-07 DIAGNOSIS — Z888 Allergy status to other drugs, medicaments and biological substances status: Secondary | ICD-10-CM

## 2021-04-07 DIAGNOSIS — R0609 Other forms of dyspnea: Secondary | ICD-10-CM | POA: Diagnosis not present

## 2021-04-07 DIAGNOSIS — K429 Umbilical hernia without obstruction or gangrene: Secondary | ICD-10-CM | POA: Diagnosis present

## 2021-04-07 DIAGNOSIS — D696 Thrombocytopenia, unspecified: Secondary | ICD-10-CM | POA: Diagnosis present

## 2021-04-07 DIAGNOSIS — Z87442 Personal history of urinary calculi: Secondary | ICD-10-CM

## 2021-04-07 DIAGNOSIS — R188 Other ascites: Secondary | ICD-10-CM

## 2021-04-07 DIAGNOSIS — I959 Hypotension, unspecified: Secondary | ICD-10-CM | POA: Diagnosis present

## 2021-04-07 DIAGNOSIS — G40909 Epilepsy, unspecified, not intractable, without status epilepticus: Secondary | ICD-10-CM | POA: Diagnosis present

## 2021-04-07 DIAGNOSIS — Z0389 Encounter for observation for other suspected diseases and conditions ruled out: Secondary | ICD-10-CM | POA: Diagnosis not present

## 2021-04-07 DIAGNOSIS — R161 Splenomegaly, not elsewhere classified: Secondary | ICD-10-CM | POA: Diagnosis present

## 2021-04-07 DIAGNOSIS — E876 Hypokalemia: Secondary | ICD-10-CM | POA: Diagnosis present

## 2021-04-07 DIAGNOSIS — Y92009 Unspecified place in unspecified non-institutional (private) residence as the place of occurrence of the external cause: Secondary | ICD-10-CM | POA: Diagnosis not present

## 2021-04-07 DIAGNOSIS — Z79899 Other long term (current) drug therapy: Secondary | ICD-10-CM

## 2021-04-07 DIAGNOSIS — Z20822 Contact with and (suspected) exposure to covid-19: Secondary | ICD-10-CM | POA: Diagnosis present

## 2021-04-07 DIAGNOSIS — I129 Hypertensive chronic kidney disease with stage 1 through stage 4 chronic kidney disease, or unspecified chronic kidney disease: Secondary | ICD-10-CM | POA: Diagnosis present

## 2021-04-07 DIAGNOSIS — E8809 Other disorders of plasma-protein metabolism, not elsewhere classified: Secondary | ICD-10-CM | POA: Diagnosis present

## 2021-04-07 DIAGNOSIS — Z9049 Acquired absence of other specified parts of digestive tract: Secondary | ICD-10-CM

## 2021-04-07 DIAGNOSIS — N183 Chronic kidney disease, stage 3 unspecified: Secondary | ICD-10-CM | POA: Diagnosis present

## 2021-04-07 DIAGNOSIS — F32A Depression, unspecified: Secondary | ICD-10-CM | POA: Diagnosis present

## 2021-04-07 DIAGNOSIS — Z96653 Presence of artificial knee joint, bilateral: Secondary | ICD-10-CM | POA: Diagnosis present

## 2021-04-07 DIAGNOSIS — K632 Fistula of intestine: Secondary | ICD-10-CM

## 2021-04-07 DIAGNOSIS — E860 Dehydration: Secondary | ICD-10-CM | POA: Diagnosis present

## 2021-04-07 DIAGNOSIS — T380X6A Underdosing of glucocorticoids and synthetic analogues, initial encounter: Secondary | ICD-10-CM | POA: Diagnosis present

## 2021-04-07 DIAGNOSIS — K7460 Unspecified cirrhosis of liver: Secondary | ICD-10-CM | POA: Diagnosis not present

## 2021-04-07 DIAGNOSIS — F419 Anxiety disorder, unspecified: Secondary | ICD-10-CM | POA: Diagnosis present

## 2021-04-07 DIAGNOSIS — E2740 Unspecified adrenocortical insufficiency: Secondary | ICD-10-CM | POA: Diagnosis present

## 2021-04-07 DIAGNOSIS — Z833 Family history of diabetes mellitus: Secondary | ICD-10-CM

## 2021-04-07 DIAGNOSIS — K7290 Hepatic failure, unspecified without coma: Secondary | ICD-10-CM | POA: Diagnosis not present

## 2021-04-07 DIAGNOSIS — K7031 Alcoholic cirrhosis of liver with ascites: Principal | ICD-10-CM | POA: Diagnosis present

## 2021-04-07 DIAGNOSIS — K7030 Alcoholic cirrhosis of liver without ascites: Secondary | ICD-10-CM | POA: Diagnosis not present

## 2021-04-07 DIAGNOSIS — F17210 Nicotine dependence, cigarettes, uncomplicated: Secondary | ICD-10-CM | POA: Diagnosis present

## 2021-04-07 DIAGNOSIS — Z7951 Long term (current) use of inhaled steroids: Secondary | ICD-10-CM

## 2021-04-07 LAB — COMPREHENSIVE METABOLIC PANEL
ALT: 18 U/L (ref 0–44)
AST: 30 U/L (ref 15–41)
Albumin: 2.2 g/dL — ABNORMAL LOW (ref 3.5–5.0)
Alkaline Phosphatase: 130 U/L — ABNORMAL HIGH (ref 38–126)
Anion gap: 9 (ref 5–15)
BUN: 6 mg/dL — ABNORMAL LOW (ref 8–23)
CO2: 30 mmol/L (ref 22–32)
Calcium: 8.1 mg/dL — ABNORMAL LOW (ref 8.9–10.3)
Chloride: 98 mmol/L (ref 98–111)
Creatinine, Ser: 1.16 mg/dL — ABNORMAL HIGH (ref 0.44–1.00)
GFR, Estimated: 52 mL/min — ABNORMAL LOW (ref >60)
Glucose, Bld: 116 mg/dL — ABNORMAL HIGH (ref 70–99)
Potassium: 2.7 mmol/L — CL (ref 3.5–5.1)
Sodium: 137 mmol/L (ref 135–145)
Total Bilirubin: 1.5 mg/dL — ABNORMAL HIGH (ref 0.3–1.2)
Total Protein: 6.5 g/dL (ref 6.5–8.1)

## 2021-04-07 LAB — CBC WITH DIFFERENTIAL/PLATELET
Abs Immature Granulocytes: 0.02 10*3/uL (ref 0.00–0.07)
Basophils Absolute: 0 10*3/uL (ref 0.0–0.1)
Basophils Relative: 1 %
Eosinophils Absolute: 0.1 10*3/uL (ref 0.0–0.5)
Eosinophils Relative: 2 %
HCT: 36.3 % (ref 36.0–46.0)
Hemoglobin: 12.1 g/dL (ref 12.0–15.0)
Immature Granulocytes: 0 %
Lymphocytes Relative: 15 %
Lymphs Abs: 0.8 10*3/uL (ref 0.7–4.0)
MCH: 33.5 pg (ref 26.0–34.0)
MCHC: 33.3 g/dL (ref 30.0–36.0)
MCV: 100.6 fL — ABNORMAL HIGH (ref 80.0–100.0)
Monocytes Absolute: 0.4 10*3/uL (ref 0.1–1.0)
Monocytes Relative: 8 %
Neutro Abs: 4 10*3/uL (ref 1.7–7.7)
Neutrophils Relative %: 74 %
Platelets: 113 10*3/uL — ABNORMAL LOW (ref 150–400)
RBC: 3.61 MIL/uL — ABNORMAL LOW (ref 3.87–5.11)
RDW: 14.7 % (ref 11.5–15.5)
WBC: 5.3 10*3/uL (ref 4.0–10.5)
nRBC: 0 % (ref 0.0–0.2)

## 2021-04-07 LAB — RESP PANEL BY RT-PCR (FLU A&B, COVID) ARPGX2
Influenza A by PCR: NEGATIVE
Influenza B by PCR: NEGATIVE
SARS Coronavirus 2 by RT PCR: NEGATIVE

## 2021-04-07 LAB — LIPASE, BLOOD: Lipase: 38 U/L (ref 11–51)

## 2021-04-07 LAB — PROTIME-INR
INR: 1.5 — ABNORMAL HIGH (ref 0.8–1.2)
Prothrombin Time: 18 seconds — ABNORMAL HIGH (ref 11.4–15.2)

## 2021-04-07 LAB — LACTIC ACID, PLASMA: Lactic Acid, Venous: 2.1 mmol/L (ref 0.5–1.9)

## 2021-04-07 LAB — MAGNESIUM: Magnesium: 1.9 mg/dL (ref 1.7–2.4)

## 2021-04-07 LAB — APTT: aPTT: 50 seconds — ABNORMAL HIGH (ref 24–36)

## 2021-04-07 MED ORDER — ALBUMIN HUMAN 25 % IV SOLN
25.0000 g | Freq: Four times a day (QID) | INTRAVENOUS | Status: AC
  Administered 2021-04-08 (×4): 25 g via INTRAVENOUS
  Filled 2021-04-07 (×4): qty 100

## 2021-04-07 MED ORDER — SODIUM CHLORIDE 0.9 % IV BOLUS
250.0000 mL | Freq: Once | INTRAVENOUS | Status: AC
  Administered 2021-04-07: 250 mL via INTRAVENOUS

## 2021-04-07 MED ORDER — LEVETIRACETAM 500 MG PO TABS
500.0000 mg | ORAL_TABLET | Freq: Two times a day (BID) | ORAL | Status: DC
  Administered 2021-04-08: 500 mg via ORAL
  Filled 2021-04-07: qty 1

## 2021-04-07 MED ORDER — LACTATED RINGERS IV BOLUS: 250.0000 mL | Freq: Once | INTRAVENOUS | Status: DC

## 2021-04-07 MED ORDER — SODIUM CHLORIDE 0.9 % IV BOLUS: 250.0000 mL | Freq: Once | INTRAVENOUS | Status: DC

## 2021-04-07 MED ORDER — POTASSIUM CHLORIDE CRYS ER 20 MEQ PO TBCR
40.0000 meq | EXTENDED_RELEASE_TABLET | ORAL | Status: AC
  Administered 2021-04-08 (×2): 40 meq via ORAL
  Filled 2021-04-07 (×2): qty 2

## 2021-04-07 MED ORDER — POTASSIUM CHLORIDE 10 MEQ/100ML IV SOLN
10.0000 meq | INTRAVENOUS | Status: AC
  Administered 2021-04-07 (×3): 10 meq via INTRAVENOUS
  Filled 2021-04-07: qty 100

## 2021-04-07 MED ORDER — LIDOCAINE-EPINEPHRINE (PF) 2 %-1:200000 IJ SOLN
10.0000 mL | Freq: Once | INTRAMUSCULAR | Status: DC
  Filled 2021-04-07: qty 20

## 2021-04-07 MED ORDER — LACTATED RINGERS IV BOLUS
500.0000 mL | Freq: Once | INTRAVENOUS | Status: AC
  Administered 2021-04-07: 500 mL via INTRAVENOUS

## 2021-04-07 NOTE — ED Provider Notes (Signed)
?[facility] ?Provider Note ? ? ?CSN: [id number] ?Arrival date & time: 04/07/21  1410 ? ?  ? ?History ? ?No chief complaint on file. ? ? ?Carla Leonard is a 66 y.o. female who presents today for concern of ascites leaking through her umbilicus. ?She has a history of alcohol abuse, thrombocytopenia, adrenal insufficiency and cirrhosis. ?She was seen by [facility] on 03/29/2021. ?She was previously admitted 1/30 until 02/05/2021 for abdominal wall cellulitis and abscess however on 3/27 she noted scant amount of bleeding from around her umbilicus. ?She called into the GI office today reporting that her umbilicus was leaking again.  She had a CT scan obtained yesterday that showed concern for significant amount of ascites and apparent communication between the peritoneal space and the umbilical hernia resulting in external leaking of ascitic fluid. ? ?Patient states that her abdomen began hurting a few hours ago in the upper midline.  No fevers.  She reports nausea and feeling poorly.  ? ?HPI ? ?  ? ?Home Medications ?Prior to Admission medications   ?Medication Sig Start Date End Date Taking? Authorizing Provider  ?ALPRAZolam (XANAX) 0.5 MG tablet Take by mouth. 03/04/21   [provider]  ?ALPRAZolam Duanne Moron) 1 MG tablet Take 0.5 mg by mouth See admin instructions. Take 1/2 tablet (0.5 mg) by mouth every morning and at bedtime (with trazodone); may also take 1/2 tablet (0.5 mg) twice daily as needed for anxiety 01/25/21   [provider]  ?budesonide-formoterol (SYMBICORT) 160-4.5 MCG/ACT inhaler Inhale 2 puffs into the lungs 2 (two) times daily as needed (shortness of breath/wheezing). 01/20/21   [provider]  ?furosemide (LASIX) 20 MG tablet 1 tab as needed    [provider]  ?hydrocortisone (CORTEF) 5 MG tablet Take 2 tablets ('10mg'$ ) every morning and take 1 tablet ('5mg'$ ) every night. ?Patient not taking: Reported on 03/29/2021 02/05/21   Bonnielee Haff, MD  ?levETIRAcetam (KEPPRA) 500 MG tablet Take 500 mg by mouth 2 (two) times daily. 01/20/21   [provider]  ?primidone (MYSOLINE) 50 MG tablet Take 100 mg by mouth every morning. 01/21/21   [provider]  ?promethazine (PHENERGAN) 12.5 MG tablet Take 12.5 mg by mouth every 4 (four) hours as needed for nausea or vomiting. 01/20/21   [provider]  ?traMADol Veatrice Bourbon) 50 MG tablet Take by mouth. 03/25/21   [provider]  ?traZODone (DESYREL) 100 MG tablet Take 100 mg by mouth at bedtime. Take with 0.5 mg alprazolam 01/20/21   [provider]  ?   ? ?Allergies    ?Sulfa antibiotics, Prednisone, Nsaids, and Other   ? ?Review of Systems   ?Review of Systems ? ?Physical Exam ?Updated Vital Signs ?BP (!) 98/58   Pulse 77   Temp 98 ?F (36.7 ?C)   Resp 14   Ht '5\' 1"'$  (1.549 m)   Wt 106.1 kg   SpO2 99%   BMI 44.21 kg/m?  ?Physical Exam ?Vitals and nursing note reviewed.  ?Constitutional:   ?   Appearance: She is ill-appearing (Chronic). She is not diaphoretic.  ?HENT:  ?   Head: Normocephalic and atraumatic.  ?Eyes:  ?   General: No scleral icterus.    ?   Right eye: No discharge.     ?   Left eye: No discharge.  ?   Conjunctiva/sclera: Conjunctivae normal.  ?Cardiovascular:  ?   Rate and Rhythm: Normal rate and regular rhythm.  ?Pulmonary:  ?  Effort: Pulmonary effort is normal. No respiratory distress.  ?   Breath sounds: No stridor.  ?Abdominal:  ?   General: There is no distension.  ?   Tenderness: There is abdominal tenderness (Mild upper midline). There is no guarding or rebound.  ?   Comments: Mild anasarca is noted.  exam limited by habitus.  Abdomen is enlarged.  No abnormal erythema noted.  There is a large amount of serous appearing fluid pooling in the umbilicus and has saturated patients gown.   ?Musculoskeletal:     ?   General: No deformity.  ?   Cervical back: Normal range of motion.  ?Skin: ?   General: Skin is warm and dry.  ?Neurological:  ?    Mental Status: She is alert.  ?   Motor: No abnormal muscle tone.  ?Psychiatric:     ?   Behavior: Behavior normal.  ? ? ?ED Results / Procedures / Treatments   ?Labs ?(all labs ordered are listed, but only abnormal results are displayed) ?Labs Reviewed  ?CBC WITH DIFFERENTIAL/PLATELET - Abnormal; Notable for the following components:  ?    Result Value  ? RBC 3.61 (*)   ? MCV 100.6 (*)   ? Platelets 113 (*)   ? All other components within normal limits  ?COMPREHENSIVE METABOLIC PANEL - Abnormal; Notable for the following components:  ? Potassium 2.7 (*)   ? Glucose, Bld 116 (*)   ? BUN 6 (*)   ? Creatinine, Ser 1.16 (*)   ? Calcium 8.1 (*)   ? Albumin 2.2 (*)   ? Alkaline Phosphatase 130 (*)   ? Total Bilirubin 1.5 (*)   ? GFR, Estimated 52 (*)   ? All other components within normal limits  ?PROTIME-INR - Abnormal; Notable for the following components:  ? Prothrombin Time 18.0 (*)   ? INR 1.5 (*)   ? All other components within normal limits  ?LACTIC ACID, PLASMA - Abnormal; Notable for the following components:  ? Lactic Acid, Venous 2.2 (*)   ? All other components within normal limits  ?LACTIC ACID, PLASMA - Abnormal; Notable for the following components:  ? Lactic Acid, Venous 2.1 (*)   ? All other components within normal limits  ?APTT - Abnormal; Notable for the following components:  ? aPTT 50 (*)   ? All other components within normal limits  ?RESP PANEL BY RT-PCR (FLU A&B, COVID) [id number]  ?CULTURE, BLOOD (ROUTINE X 2)  ?CULTURE, BLOOD (ROUTINE X 2)  ?URINE CULTURE  ?LIPASE, BLOOD  ?MAGNESIUM  ?URINALYSIS, ROUTINE W REFLEX MICROSCOPIC  ? ? ?EKG ?EKG Interpretation ? ?Date/Time:  Wednesday April 07 2021 19:52:06 EDT ?Ventricular Rate:  74 ?PR Interval:  54 ?QRS Duration: 140 ?QT Interval:  481 ?QTC Calculation: 534 ?R Axis:   60 ?Text Interpretation: Sinus rhythm Short PR interval Nonspecific intraventricular conduction delay Probable lateral infarct, old Probable anteroseptal infarct, old Confirmed by Godfrey Pick [phone]) on 04/07/2021 8:41:52 PM ? ?Radiology ?CT Abdomen Pelvis W Contrast ? ?Result Date: 04/07/2021 ?CLINICAL DATA:  Abdominal wall abscess. Follow-up study of January. Draining fluid from the umbilicus. EXAM: CT ABDOMEN AND PELVIS WITH CONTRAST TECHNIQUE: Multidetector CT imaging of the abdomen and pelvis was performed using the standard protocol following bolus administration of intravenous contrast. RADIATION DOSE REDUCTION: This exam was performed according to the departmental dose-optimization program which includes automated exposure control, adjustment of the mA and/or kV according to patient size and/or use of iterative reconstruction technique. CONTRAST:  136m OMNIPAQUE IOHEXOL 300 MG/ML  SOLN COMPARISON:  02/01/2021.  01/08/2021. FINDINGS: Lower chest: Lung bases are clear. No pleural or pericardial fluid. Pericardial lymph node as seen previously measuring 1 cm in size. Hepatobiliary: Cirrhosis of the liver as seen previously. No focal liver parenchymal mass. Previous cholecystectomy. Chronic prominence of the inter and extrahepatic biliary ducts without evidence of obstructing lesion. Marked increase in the amount ascites. Pancreas: Atrophic changes.  No acute finding. Spleen: Size within normal limits.  No focal lesion. Adrenals/Urinary Tract: Adrenal glands are normal. Nonobstructing 3 mm stone in the upper pole the left kidney. Few small liver cysts which do not require further follow-up. No hydronephrosis. Stomach/Bowel: Stomach and small intestine are normal. No colon pathology. Vascular/Lymphatic: Aorta shows mild atherosclerotic change but no aneurysm. IVC is normal. No retroperitoneal adenopathy. Reproductive: Normal.  No pelvic mass. Other: Some of the ascites extends into a periumbilical hernia which extends all the way to the skin surface and is probably the site of drainage. Nonspecific edema the subcutaneous tissues including at the abdominal wall. Musculoskeletal: Extensive chronic  degenerative changes throughout the lumbar region. Inferior endplate compression fracture at L2 with loss of height of 10-20%, progressive since January of this year. IMPRESSION: Cirrhosis of the liver. Previous c

## 2021-04-07 NOTE — ED Notes (Signed)
PA Phylliss Bob notified of hypotension ?

## 2021-04-07 NOTE — ED Provider Triage Note (Signed)
Emergency Medicine Provider Triage Evaluation Note ? ?Carla Leonard , a 66 y.o. female  was evaluated in triage.  Pt complains of abdominal abscess, reports notice clear fluid coming from her umbilicus. Prior hx of a umbilical hernia. A lot of pain draining from the umbilicus. No blood, no pus. No fever.   ? ?Review of Systems  ?Positive: Abdominal pain, wound ?Negative: Nausea, vomiting ? ?Physical Exam  ?There were no vitals taken for this visit. ?Gen:   Awake, no distress   ?Resp:  Normal effort  ?MSK:   Moves extremities without difficulty  ?Other:   ? ?Medical Decision Making  ?Medically screening exam initiated at 2:10 PM.  Appropriate orders placed.  EMRY TOBIN was informed that the remainder of the evaluation will be completed by another provider, this initial triage assessment does not replace that evaluation, and the importance of remaining in the ED until their evaluation is complete. ? ?Patient here with underlying umbilical hernia, clear drainage began this morning, about a half a liter o clear liquid noted.  Vitals are stable, this history is chronic for patient.  No fever, no nausea, no vomiting under the care of ?Sharyn Creamer, MD ?Gastroenterology ?  ?Janeece Fitting, PA-C ?04/07/21 1415 ? ?

## 2021-04-07 NOTE — Telephone Encounter (Signed)
Upon review of pt chart, appears pt has presented to ED @ 1410 today to undergo further evaluation. Will f/u on ED progress and any recommendations found from ED provider. Referral has been placed for [facility] and will fax records once pt has been d/c'd from ED. Uncertain at this time if general surgery will be consulted. In addition, from notes of wnd care ref, appears pt was called for scheduling purposes, but declined to schedule appt, stating that she has too many appt coming up. Would call back to r/s once her schedule clears up. Routing this message to Dr. Lorenso Courier for continuity of care purposes.  ?

## 2021-04-07 NOTE — ED Triage Notes (Signed)
Pt arrived with PTAR c/o leaking clear fluid from her belly button. Pt has a hx of an umbilical hernia and had a similar episode in feb. Pt denies any pain.  ?

## 2021-04-07 NOTE — Telephone Encounter (Signed)
Inbound call from patient reports she woke up and her belly button is leaking again because of her hernia ?

## 2021-04-08 ENCOUNTER — Inpatient Hospital Stay (HOSPITAL_COMMUNITY): Payer: Medicare HMO

## 2021-04-08 ENCOUNTER — Encounter (HOSPITAL_COMMUNITY): Payer: Self-pay | Admitting: Internal Medicine

## 2021-04-08 DIAGNOSIS — R609 Edema, unspecified: Secondary | ICD-10-CM

## 2021-04-08 DIAGNOSIS — R0609 Other forms of dyspnea: Secondary | ICD-10-CM

## 2021-04-08 DIAGNOSIS — K7290 Hepatic failure, unspecified without coma: Secondary | ICD-10-CM | POA: Diagnosis not present

## 2021-04-08 DIAGNOSIS — R188 Other ascites: Secondary | ICD-10-CM

## 2021-04-08 DIAGNOSIS — E876 Hypokalemia: Secondary | ICD-10-CM

## 2021-04-08 DIAGNOSIS — K7460 Unspecified cirrhosis of liver: Secondary | ICD-10-CM

## 2021-04-08 LAB — HEPATIC FUNCTION PANEL
ALT: 14 U/L (ref 0–44)
AST: 24 U/L (ref 15–41)
Albumin: 2.2 g/dL — ABNORMAL LOW (ref 3.5–5.0)
Alkaline Phosphatase: 87 U/L (ref 38–126)
Bilirubin, Direct: 0.3 mg/dL — ABNORMAL HIGH (ref 0.0–0.2)
Indirect Bilirubin: 0.7 mg/dL (ref 0.3–0.9)
Total Bilirubin: 1 mg/dL (ref 0.3–1.2)
Total Protein: 5.4 g/dL — ABNORMAL LOW (ref 6.5–8.1)

## 2021-04-08 LAB — URINALYSIS, ROUTINE W REFLEX MICROSCOPIC
Bilirubin Urine: NEGATIVE
Glucose, UA: NEGATIVE mg/dL
Hgb urine dipstick: NEGATIVE
Ketones, ur: NEGATIVE mg/dL
Leukocytes,Ua: NEGATIVE
Nitrite: NEGATIVE
Protein, ur: NEGATIVE mg/dL
Specific Gravity, Urine: 1.014 (ref 1.005–1.030)
pH: 6 (ref 5.0–8.0)

## 2021-04-08 LAB — ECHOCARDIOGRAM COMPLETE
AR max vel: 1.58 cm2
AV Area VTI: 1.57 cm2
AV Area mean vel: 1.6 cm2
AV Mean grad: 10 mmHg
AV Peak grad: 17.5 mmHg
Ao pk vel: 2.09 m/s
Area-P 1/2: 3.63 cm2
Height: 61 in
S' Lateral: 2.8 cm
Weight: 3744 oz

## 2021-04-08 LAB — CBC WITH DIFFERENTIAL/PLATELET
Abs Immature Granulocytes: 0.01 10*3/uL (ref 0.00–0.07)
Basophils Absolute: 0 10*3/uL (ref 0.0–0.1)
Basophils Relative: 1 %
Eosinophils Absolute: 0.1 10*3/uL (ref 0.0–0.5)
Eosinophils Relative: 2 %
HCT: 32 % — ABNORMAL LOW (ref 36.0–46.0)
Hemoglobin: 10.5 g/dL — ABNORMAL LOW (ref 12.0–15.0)
Immature Granulocytes: 0 %
Lymphocytes Relative: 19 %
Lymphs Abs: 0.9 10*3/uL (ref 0.7–4.0)
MCH: 33.1 pg (ref 26.0–34.0)
MCHC: 32.8 g/dL (ref 30.0–36.0)
MCV: 100.9 fL — ABNORMAL HIGH (ref 80.0–100.0)
Monocytes Absolute: 0.5 10*3/uL (ref 0.1–1.0)
Monocytes Relative: 12 %
Neutro Abs: 3.1 10*3/uL (ref 1.7–7.7)
Neutrophils Relative %: 66 %
Platelets: 93 10*3/uL — ABNORMAL LOW (ref 150–400)
RBC: 3.17 MIL/uL — ABNORMAL LOW (ref 3.87–5.11)
RDW: 14.7 % (ref 11.5–15.5)
WBC: 4.6 10*3/uL (ref 4.0–10.5)
nRBC: 0 % (ref 0.0–0.2)

## 2021-04-08 LAB — BASIC METABOLIC PANEL
Anion gap: 8 (ref 5–15)
BUN: 7 mg/dL — ABNORMAL LOW (ref 8–23)
CO2: 28 mmol/L (ref 22–32)
Calcium: 7.7 mg/dL — ABNORMAL LOW (ref 8.9–10.3)
Chloride: 101 mmol/L (ref 98–111)
Creatinine, Ser: 1.01 mg/dL — ABNORMAL HIGH (ref 0.44–1.00)
GFR, Estimated: >60 mL/min (ref >60)
Glucose, Bld: 96 mg/dL (ref 70–99)
Potassium: 2.7 mmol/L — CL (ref 3.5–5.1)
Sodium: 137 mmol/L (ref 135–145)

## 2021-04-08 LAB — CORTISOL: Cortisol, Plasma: 1.4 ug/dL

## 2021-04-08 LAB — LACTIC ACID, PLASMA: Lactic Acid, Venous: 2.2 mmol/L (ref 0.5–1.9)

## 2021-04-08 LAB — MAGNESIUM: Magnesium: 1.9 mg/dL (ref 1.7–2.4)

## 2021-04-08 LAB — POTASSIUM: Potassium: 3.9 mmol/L (ref 3.5–5.1)

## 2021-04-08 MED ORDER — TRAZODONE HCL 50 MG PO TABS
100.0000 mg | ORAL_TABLET | Freq: Every day | ORAL | Status: DC
  Administered 2021-04-08 – 2021-04-11 (×5): 100 mg via ORAL
  Filled 2021-04-08 (×6): qty 2

## 2021-04-08 MED ORDER — POTASSIUM CHLORIDE 10 MEQ/100ML IV SOLN
10.0000 meq | INTRAVENOUS | Status: AC
  Administered 2021-04-08 (×6): 10 meq via INTRAVENOUS
  Filled 2021-04-08 (×6): qty 100

## 2021-04-08 MED ORDER — POTASSIUM CHLORIDE CRYS ER 20 MEQ PO TBCR
40.0000 meq | EXTENDED_RELEASE_TABLET | Freq: Two times a day (BID) | ORAL | Status: AC
  Administered 2021-04-08 (×2): 40 meq via ORAL
  Filled 2021-04-08 (×2): qty 2

## 2021-04-08 MED ORDER — PRIMIDONE 50 MG PO TABS
100.0000 mg | ORAL_TABLET | Freq: Every morning | ORAL | Status: DC
  Administered 2021-04-08 – 2021-04-11 (×4): 100 mg via ORAL
  Filled 2021-04-08 (×5): qty 2

## 2021-04-08 MED ORDER — ALPRAZOLAM 0.5 MG PO TABS
0.5000 mg | ORAL_TABLET | Freq: Two times a day (BID) | ORAL | Status: DC
  Administered 2021-04-08 – 2021-04-11 (×8): 0.5 mg via ORAL
  Filled 2021-04-08 (×2): qty 1
  Filled 2021-04-08: qty 2
  Filled 2021-04-08 (×5): qty 1

## 2021-04-08 MED ORDER — SODIUM CHLORIDE 0.9 % IV SOLN
2.0000 g | Freq: Every day | INTRAVENOUS | Status: DC
  Administered 2021-04-08 – 2021-04-11 (×5): 2 g via INTRAVENOUS
  Filled 2021-04-08 (×5): qty 20

## 2021-04-08 MED ORDER — DEXAMETHASONE SODIUM PHOSPHATE 10 MG/ML IJ SOLN
10.0000 mg | Freq: Every day | INTRAMUSCULAR | Status: DC
  Administered 2021-04-08 – 2021-04-09 (×2): 10 mg via INTRAVENOUS
  Filled 2021-04-08 (×2): qty 1

## 2021-04-08 MED ORDER — MAGNESIUM SULFATE 2 GM/50ML IV SOLN
2.0000 g | Freq: Once | INTRAVENOUS | Status: AC
  Administered 2021-04-08: 2 g via INTRAVENOUS
  Filled 2021-04-08: qty 50

## 2021-04-08 MED ORDER — SODIUM CHLORIDE 0.9 % IV BOLUS
250.0000 mL | Freq: Once | INTRAVENOUS | Status: AC
  Administered 2021-04-08: 250 mL via INTRAVENOUS

## 2021-04-08 MED ORDER — DEXAMETHASONE SODIUM PHOSPHATE 4 MG/ML IJ SOLN
0.7500 mg | Freq: Every day | INTRAMUSCULAR | Status: DC
  Administered 2021-04-08: 0.76 mg via INTRAVENOUS
  Filled 2021-04-08: qty 1

## 2021-04-08 MED ORDER — PROSOURCE PLUS PO LIQD
30.0000 mL | Freq: Two times a day (BID) | ORAL | Status: DC
  Administered 2021-04-08 (×2): 30 mL via ORAL
  Filled 2021-04-08 (×4): qty 30

## 2021-04-08 MED ORDER — LEVETIRACETAM 500 MG PO TABS
500.0000 mg | ORAL_TABLET | Freq: Two times a day (BID) | ORAL | Status: DC
  Administered 2021-04-08 – 2021-04-11 (×8): 500 mg via ORAL
  Filled 2021-04-08 (×8): qty 1

## 2021-04-08 MED ORDER — MOMETASONE FURO-FORMOTEROL FUM 200-5 MCG/ACT IN AERO
2.0000 | INHALATION_SPRAY | Freq: Two times a day (BID) | RESPIRATORY_TRACT | Status: DC
Start: 2021-04-08 — End: 2021-04-13
  Administered 2021-04-08 – 2021-04-12 (×9): 2 via RESPIRATORY_TRACT
  Filled 2021-04-08: qty 8.8

## 2021-04-08 MED ORDER — HEPARIN SODIUM (PORCINE) 5000 UNIT/ML IJ SOLN
5000.0000 [IU] | Freq: Three times a day (TID) | INTRAMUSCULAR | Status: DC
  Administered 2021-04-08 – 2021-04-12 (×12): 5000 [IU] via SUBCUTANEOUS
  Filled 2021-04-08 (×12): qty 1

## 2021-04-08 NOTE — Progress Notes (Signed)
Pt arrived to floor via stretcher from the ED around by ED RN. Pt amb from stretcher to hospital bed with steady gait. Pt alert and oriented x4 in no acute distress on assessment. VSS. Respirations even and unlabored on room air. Pt oriented to room. Encouraged to use call bell for assistance. Call bell within reach. Bed in low position.  ?

## 2021-04-08 NOTE — ED Notes (Signed)
Purwic on client, aware of urine need  ?

## 2021-04-08 NOTE — ED Notes (Signed)
MD Hal Hope paged regarding reoccurrence of asymptomatic hypotension. 239m fluid bolus order received.  ?

## 2021-04-08 NOTE — H&P (Addendum)
?History and Physical  ? ? ?Carla Leonard BOF:[id number] DOB: 1955/03/25 DOA: 04/07/2021 ? ?PCP: Pcp, No  ?Patient coming from: Home. ? ?Chief Complaint: Discharge from the medications. ? ?HPI: Carla Leonard is a 66 y.o. female with history of seizures on Keppra, hypertension used to take Aldactone, chronic kidney disease stage III recently admitted twice in February 2023, once for abdominal wall cellulitis second one for closed fracture of the femur managed nonoperatively presents to the ER after patient noticed increasing discharge from the umbilicus.  This happened yesterday after her physical therapy.  Denies any abdominal pain nausea vomiting or diarrhea.  Denies any chest pain or shortness of breath. ? ?ED Course: In the ER patient was hypotensive responded to fluids.  Lactic acid was elevated.  LFTs were mildly elevated.  CT abdomen pelvis shows ascites with fluid draining through the umbilical hernia and features concerning for cirrhosis..  ER physician discussed with Dr. Donne Hazel general surgeon who at this time felt that patient's ascites needs to be addressed.  ER physician discussed with  GI who advised admission and they will see patient in consult.  Patient's labs do show low albumin levels and potassium is also low at 2.7.  EKG shows normal sinus rhythm with low voltage. ? ?Review of Systems: As per HPI, rest all negative. ? ? ?Past Medical History:  ?Diagnosis Date  ? Arthritis   ? Depression   ? History of kidney stones   ? Hypertension   ? ? ?Past Surgical History:  ?Procedure Laterality Date  ? APPENDECTOMY    ? CHOLECYSTECTOMY    ? ELBOW SURGERY    ? RIGHT  ? JOINT REPLACEMENT    ? right TKA 06/2010  ? KNEE ARTHROPLASTY  06/17/2011  ? Procedure: COMPUTER ASSISTED TOTAL KNEE ARTHROPLASTY;  Surgeon: Alta Corning, MD;  Location: [facility];  Service: Orthopedics;  Laterality: Left;  TOTAL KNEE REPLACEMENT WITH GENERAL ANESTHESIA AND PRE OP FEMORAL NERVE BLOCK  ? PERIANAL CYST    ? ? ? reports  that she has been smoking cigarettes. She has been smoking an average of .25 packs per day. She does not have any smokeless tobacco history on file. She reports current alcohol use. She reports that she does not use drugs. ? ?Allergies  ?Allergen Reactions  ? Sulfa Antibiotics Anaphylaxis  ? Prednisone Other (See Comments)  ?  hallucinations  ? Nsaids Other (See Comments)  ?  Stomach pain, diarrhea  ? Other Other (See Comments)  ?  Steroids: hallucinations  ? ? ?Family History  ?Problem Relation Age of Onset  ? Pneumonia Mother   ? Diabetes Father   ? Colon cancer Neg Hx   ? Esophageal cancer Neg Hx   ? Stomach cancer Neg Hx   ? Pancreatic cancer Neg Hx   ? Colon polyps Neg Hx   ? ? ?Prior to Admission medications   ?Medication Sig Start Date End Date Taking? Authorizing Provider  ?ALPRAZolam (XANAX) 0.5 MG tablet Take by mouth. 03/04/21   [provider]  ?ALPRAZolam Duanne Moron) 1 MG tablet Take 0.5 mg by mouth See admin instructions. Take 1/2 tablet (0.5 mg) by mouth every morning and at bedtime (with trazodone); may also take 1/2 tablet (0.5 mg) twice daily as needed for anxiety 01/25/21   [provider]  ?budesonide-formoterol (SYMBICORT) 160-4.5 MCG/ACT inhaler Inhale 2 puffs into the lungs 2 (two) times daily as needed (shortness of breath/wheezing). 01/20/21   [provider]  ?furosemide (LASIX) 20 MG  tablet 1 tab as needed    [provider]  ?hydrocortisone (CORTEF) 5 MG tablet Take 2 tablets ('10mg'$ ) every morning and take 1 tablet ('5mg'$ ) every night. ?Patient not taking: Reported on 03/29/2021 02/05/21   Bonnielee Haff, MD  ?levETIRAcetam (KEPPRA) 500 MG tablet Take 500 mg by mouth 2 (two) times daily. 01/20/21   [provider]  ?primidone (MYSOLINE) 50 MG tablet Take 100 mg by mouth every morning. 01/21/21   [provider]  ?promethazine (PHENERGAN) 12.5 MG tablet Take 12.5 mg by mouth every 4 (four) hours as needed for nausea or vomiting. 01/20/21   [provider]  ?traMADol Veatrice Bourbon) 50 MG tablet Take by mouth. 03/25/21   [provider]  ?traZODone (DESYREL) 100 MG tablet Take 100 mg by mouth at bedtime. Take with 0.5 mg alprazolam 01/20/21   [provider]  ? ? ?Physical Exam: ?Constitutional: Moderately built and nourished. ?Vitals:  ? 04/07/21 2230 04/07/21 2300 04/07/21 2338 04/08/21 0000  ?BP: 103/63 (!) 98/58  (!) 100/57  ?Pulse: 73 77  69  ?Resp: '15 14  10  '$ ?Temp:   97.9 ?F (36.6 ?C)   ?TempSrc:   Rectal   ?SpO2: 99% 99%  98%  ?Weight:      ?Height:      ? ?Eyes: Anicteric no pallor. ?ENMT: No discharge from the ears eyes nose and mouth. ?Neck: No mass felt.  No neck rigidity. ?Respiratory: No rhonchi or crepitations. ?Cardiovascular: S1-S2 heard. ?Abdomen: Soft nontender mild serous discharge from the umbilicus. ?Musculoskeletal: Bilateral lower extremity edema present. ?Skin: No rash. ?Neurologic: Alert awake oriented time place and person.  Moves all extremities. ?Psychiatric: Appears normal.  Normal affect. ? ? ?Labs on Admission: I have personally reviewed following labs and imaging studies ? ?CBC: ?Recent Labs  ?Lab 04/07/21 ?1425  ?WBC 5.3  ?NEUTROABS 4.0  ?HGB 12.1  ?HCT 36.3  ?MCV 100.6*  ?PLT 113*  ? ?Basic Metabolic Panel: ?Recent Labs  ?Lab 04/06/21 ?1302 04/07/21 ?1425 04/07/21 ?1826  ?NA  --  137  --   ?K  --  2.7*  --   ?CL  --  98  --   ?CO2  --  30  --   ?GLUCOSE  --  116*  --   ?BUN  --  6*  --   ?CREATININE 1.20* 1.16*  --   ?CALCIUM  --  8.1*  --   ?MG  --   --  1.9  ? ?GFR: ?Estimated Creatinine Clearance: 54.3 mL/min (A) (by C-G formula based on SCr of 1.16 mg/dL (H)). ?Liver Function Tests: ?Recent Labs  ?Lab 04/07/21 ?1425  ?AST 30  ?ALT 18  ?ALKPHOS 130*  ?BILITOT 1.5*  ?PROT 6.5  ?ALBUMIN 2.2*  ? ?Recent Labs  ?Lab 04/07/21 ?1425  ?LIPASE 38  ? ?No results for input(s): AMMONIA in the last 168 hours. ?Coagulation Profile: ?Recent Labs  ?Lab 04/07/21 ?1826  ?INR 1.5*  ? ?Cardiac Enzymes: ?No results for  input(s): CKTOTAL, CKMB, CKMBINDEX, TROPONINI in the last 168 hours. ?BNP (last 3 results) ?No results for input(s): PROBNP in the last 8760 hours. ?HbA1C: ?No results for input(s): HGBA1C in the last 72 hours. ?CBG: ?No results for input(s): GLUCAP in the last 168 hours. ?Lipid Profile: ?No results for input(s): CHOL, HDL, LDLCALC, TRIG, CHOLHDL, LDLDIRECT in the last 72 hours. ?Thyroid Function Tests: ?No results for input(s): TSH, T4TOTAL, FREET4, T3FREE, THYROIDAB in the last 72 hours. ?Anemia Panel: ?No results for input(s): VITAMINB12,  FOLATE, FERRITIN, TIBC, IRON, RETICCTPCT in the last 72 hours. ?Urine analysis: ?   ?Component Value Date/Time  ? [place] 02/01/2021 2337  ? APPEARANCEUR CLOUDY (A) 02/01/2021 2337  ? LABSPEC <1.005 (L) 02/01/2021 2337  ? PHURINE 6.5 02/01/2021 2337  ? GLUCOSEU NEGATIVE 02/01/2021 2337  ? HGBUR TRACE (A) 02/01/2021 2337  ? [place] NEGATIVE 02/01/2021 2337  ? [place] Stain NEGATIVE 02/01/2021 2337  ? [place] NEGATIVE 02/01/2021 2337  ? UROBILINOGEN 0.2 06/17/2011 0636  ? NITRITE NEGATIVE 02/01/2021 2337  ? LEUKOCYTESUR TRACE (A) 02/01/2021 2337  ? ?Sepsis Labs: ?'@LABRCNTIP'$ (procalcitonin:4,lacticidven:4) ?) ?Recent Results (from the past 240 hour(s))  ?Resp Panel by RT-PCR (Flu A&B, Covid) Nasopharyngeal Swab     Status: None  ? Collection Time: 04/07/21  6:18 PM  ? Specimen: Nasopharyngeal Swab; Nasopharyngeal(NP) swabs in vial transport medium  ?Result Value Ref Range Status  ? SARS Coronavirus 2 by RT PCR NEGATIVE NEGATIVE Final  ?  Comment: (NOTE) ?SARS-CoV-2 target nucleic acids are NOT DETECTED. ? ?The SARS-CoV-2 RNA is generally detectable in upper respiratory ?specimens during the acute phase of infection. The lowest ?concentration of SARS-CoV-2 viral copies this assay can detect is ?138 copies/mL. A negative result does not preclude SARS-Cov-2 ?infection and should not be used as the sole basis for treatment or ?other patient management decisions. A negative  result may occur with  ?improper specimen collection/handling, submission of specimen other ?than nasopharyngeal swab, presence of viral mutation(s) within the ?areas targeted by this assay, and inadequate number

## 2021-04-08 NOTE — Progress Notes (Signed)
VASCULAR LAB ? ? ? ?Bilateral lower extremity venous duplex has been performed. ? ?See CV proc for preliminary results. ? ? ?Blong Busk, RVT ?04/08/2021, 1:42 PM ? ?

## 2021-04-08 NOTE — Consult Note (Signed)
? ? ? Consultation ? ?Referring Provider: Dr. Candiss Norse     ?Primary Care Physician:  Pcp, No ?Primary Gastroenterologist: Dr. Lorenso Courier       ?Reason for Consultation: Decompensated alcoholic cirrhosis ?       ? HPI:   ?Carla Leonard is a 66 y.o. female with a past medical history of seizures, thrombocytopenia, adrenal insufficiency, hypertension, CKD stage III and alcoholic cirrhosis, who presented to the ED with increasing drainage/discharge from her umbilicus. ?   Today, the patient tells me that she is "sick of all of this" and tired of having to come to the hospital for various reasons.  Tells me last time she was here she "almost died", because she was told she had very low blood pressure.  Explains that she seemed to be doing well at home over the past couple of weeks but yesterday started with fluid leaking from her umbilicus again after having a session of PT at home and laying down for rest.  Tells me just "soaked her", it was almost like she was "having a baby", but the fluid is coming from the long place.  She returned to the ER.  Patient does tell me she has been taking her Lasix daily but did not take her dose yesterday or today. ?   Describes history of alcohol use from the age of 41 until about 5 years ago or so and tells me she did have a problem with alcohol abuse at one point.  Tells me she thinks her liver problems came from being "overmedicated" by one of her previous doctors with opioids. ?   Denies fever, chills, blood in her stool, weight loss, change in bowel habits, nausea or vomiting.  ?    ?ED course: CT abdomen pelvis shows ascites with fluid draining through the umbilical hernia and features concerning for cirrhosis, potassium low at 2.7; hepatic function panel with an albumin low at 2.2, direct bili minimally increased at 0.3 ? ?GI history: ?03/29/2021 LFT work-up: Work-up showed patient was immune against hepatitis A, iron levels were slightly high, and HFE was ordered to see if iron  overload was hereditary in nature or due to inflammation, other labs normal ?03/29/2021 office visit with Dr. Lorenso Courier: At that time discussed a recent admission 1/30-02/05/2021 for abdominal wall cellulitis and abscess for which she was treated with antibiotics, this had improved at that time however she did note scant amounts of bleeding from her bellybutton, discussed recent diagnosis of adrenal insufficiency but was no longer taking Hydrocortisone; discussed another admission 2/9-2/13/2023 for a fall resulting in a new nondisplaced periprosthetic distal femoral metaphysis fracture treated with nonoperative management; plan: At that time discussed colon cancer screening with a colonoscopy but she declined, she wanted to pursue Cologuard, had work-up in regards to elevated LFTs and discussed history of significant alcohol use which could have led to alcoholic liver disease, she was also referred to wound care for care of her umbilicus ?02/01/2021 CT AP with contrast: Showed small enhancing fluid collections in the subcutaneous tissues at the level of the umbilicus measuring 3.1 x 0.9 x 2.2 cm, anterior abdominal wall subcutaneous stranding and skin thickening which may be related to cellulitis, significant decrease in ascites, stable splenomegaly and aortic atherosclerosis ? ?Past Medical History:  ?Diagnosis Date  ? Arthritis   ? Depression   ? History of kidney stones   ? Hypertension   ? ? ?Past Surgical History:  ?Procedure Laterality Date  ? APPENDECTOMY    ?  CHOLECYSTECTOMY    ? ELBOW SURGERY    ? RIGHT  ? JOINT REPLACEMENT    ? right TKA 06/2010  ? KNEE ARTHROPLASTY  06/17/2011  ? Procedure: COMPUTER ASSISTED TOTAL KNEE ARTHROPLASTY;  Surgeon: Alta Corning, MD;  Location: [facility];  Service: Orthopedics;  Laterality: Left;  TOTAL KNEE REPLACEMENT WITH GENERAL ANESTHESIA AND PRE OP FEMORAL NERVE BLOCK  ? PERIANAL CYST    ? ? ?Family History  ?Problem Relation Age of Onset  ? Pneumonia Mother   ? Diabetes Father   ?  Colon cancer Neg Hx   ? Esophageal cancer Neg Hx   ? Stomach cancer Neg Hx   ? Pancreatic cancer Neg Hx   ? Colon polyps Neg Hx   ? ? ?Social History  ? ?Tobacco Use  ? Smoking status: Every Day  ?  Packs/day: 0.25  ?  Types: Cigarettes  ? Tobacco comments:  ?  Former 2 ppd  ?Vaping Use  ? Vaping Use: Never used  ?Substance Use Topics  ? Alcohol use: Yes  ? Drug use: No  ? ? ?Prior to Admission medications   ?Medication Sig Start Date End Date Taking? Authorizing Provider  ?ALPRAZolam (XANAX) 0.5 MG tablet Take by mouth. 03/04/21   [provider]  ?ALPRAZolam Duanne Moron) 1 MG tablet Take 0.5 mg by mouth See admin instructions. Take 1/2 tablet (0.5 mg) by mouth every morning and at bedtime (with trazodone); may also take 1/2 tablet (0.5 mg) twice daily as needed for anxiety 01/25/21   [provider]  ?budesonide-formoterol (SYMBICORT) 160-4.5 MCG/ACT inhaler Inhale 2 puffs into the lungs 2 (two) times daily as needed (shortness of breath/wheezing). 01/20/21   [provider]  ?furosemide (LASIX) 20 MG tablet 1 tab as needed    [provider]  ?hydrocortisone (CORTEF) 5 MG tablet Take 2 tablets ('10mg'$ ) every morning and take 1 tablet ('5mg'$ ) every night. ?Patient not taking: Reported on 03/29/2021 02/05/21   Bonnielee Haff, MD  ?levETIRAcetam (KEPPRA) 500 MG tablet Take 500 mg by mouth 2 (two) times daily. 01/20/21   [provider]  ?primidone (MYSOLINE) 50 MG tablet Take 100 mg by mouth every morning. 01/21/21   [provider]  ?promethazine (PHENERGAN) 12.5 MG tablet Take 12.5 mg by mouth every 4 (four) hours as needed for nausea or vomiting. 01/20/21   [provider]  ?traMADol Veatrice Bourbon) 50 MG tablet Take by mouth. 03/25/21   [provider]  ?traZODone (DESYREL) 100 MG tablet Take 100 mg by mouth at bedtime. Take with 0.5 mg alprazolam 01/20/21   [provider]  ? ? ?Current Facility-Administered Medications  ?Medication Dose Route Frequency  Provider Last Rate Last Admin  ? albumin human 25 % solution 25 g  25 g Intravenous Q6H Rise Patience, MD 60 mL/hr at 04/08/21 0503 25 g at 04/08/21 0503  ? ALPRAZolam Duanne Moron) tablet 0.5 mg  0.5 mg Oral BID Thurnell Lose, MD      ? cefTRIAXone (ROCEPHIN) 2 g in sodium chloride 0.9 % 100 mL IVPB  2 g Intravenous QHS Rise Patience, MD   Stopped at 04/08/21 0214  ? dexamethasone (DECADRON) injection 10 mg  10 mg Intravenous Daily Thurnell Lose, MD   10 mg at 04/08/21 0744  ? levETIRAcetam (KEPPRA) tablet 500 mg  500 mg Oral BID Thurnell Lose, MD      ? mometasone-formoterol [facility]) 200-5 MCG/ACT inhaler 2 puff  2 puff Inhalation BID Gean Birchwood  N, MD      ? potassium chloride 10 mEq in 100 mL IVPB  10 mEq Intravenous Q1 Hr x 6 Thurnell Lose, MD   Stopped at 04/08/21 [phone]  ? potassium chloride SA (KLOR-CON M) CR tablet 40 mEq  40 mEq Oral BID Thurnell Lose, MD   40 mEq at 04/08/21 0743  ? primidone (MYSOLINE) tablet 100 mg  100 mg Oral q morning Rise Patience, MD      ? traZODone (DESYREL) tablet 100 mg  100 mg Oral QHS Rise Patience, MD   100 mg at 04/08/21 0118  ? ?Current Outpatient Medications  ?Medication Sig Dispense Refill  ? ALPRAZolam (XANAX) 0.5 MG tablet Take by mouth.    ? ALPRAZolam (XANAX) 1 MG tablet Take 0.5 mg by mouth See admin instructions. Take 1/2 tablet (0.5 mg) by mouth every morning and at bedtime (with trazodone); may also take 1/2 tablet (0.5 mg) twice daily as needed for anxiety    ? budesonide-formoterol (SYMBICORT) 160-4.5 MCG/ACT inhaler Inhale 2 puffs into the lungs 2 (two) times daily as needed (shortness of breath/wheezing).    ? furosemide (LASIX) 20 MG tablet 1 tab as needed    ? hydrocortisone (CORTEF) 5 MG tablet Take 2 tablets ('10mg'$ ) every morning and take 1 tablet ('5mg'$ ) every night. (Patient not taking: Reported on 03/29/2021) 90 tablet 2  ? levETIRAcetam (KEPPRA) 500 MG tablet Take 500 mg by mouth 2 (two) times daily.    ?  primidone (MYSOLINE) 50 MG tablet Take 100 mg by mouth every morning.    ? promethazine (PHENERGAN) 12.5 MG tablet Take 12.5 mg by mouth every 4 (four) hours as needed for nausea or vomiting.    ? traMADol (ULTRA

## 2021-04-08 NOTE — Telephone Encounter (Signed)
Appears pt has been admitted to [facility] for management of her ascites. Also appears general surgery has been consulted. Does not seem referral to general surgery will be required as they will likely f/u with pt after discharge. Will await pt discharge and any orders from [facility] providers re: GI care management. Routing to Dr. Lorenso Courier to make her aware of admission. ?

## 2021-04-08 NOTE — ED Notes (Signed)
Clear, yellow fluid continues to leak from patients belly button. Significant amount of drainage noted at this time, saturating two towels through gauze dressing. Site cleaned and redressed.  ?

## 2021-04-08 NOTE — Progress Notes (Signed)
?                                  PROGRESS NOTE                                             ?                                                                                                                     ?                                         ? ? Patient Demographics:  ? ? Carla Leonard, is a 66 y.o. female, DOB - 1955/07/11, FTD:[id number] ? ?Outpatient Primary MD for the patient is Pcp, No    LOS - 1  Admit date - 04/07/2021   ? ?Chief Complaint  ?Patient presents with  ? drainage from umbilical area  ?    ? ?Brief Narrative (HPI from H&P)   66 y.o. female with history of seizures on Keppra, hypertension used to take Aldactone, chronic kidney disease stage III recently admitted twice in February 2023, once for abdominal wall cellulitis second one for closed fracture of the femur managed nonoperatively presents to the ER after patient noticed fluid oozing around her umbilicus, hypotension and severe weakness. ? ? Subjective:  ? ? Enzo Bi today has, No headache, No chest pain, No abdominal pain - No Nausea, No new weakness tingling or numbness, no SOB ? ? Assessment  & Plan :  ? ?Ascites fluid leaking around umbilical hernia causing dehydration, hypokalemia in a patient with alcoholic liver cirrhosis. she has been admitted to the hospital, admitting physician had discussed her care with general surgeon Dr. Donne Hazel who recommended to get GI input for ascites management.  GI on board.  Her fluid is acetic fluid which is oozing out of her abdominal wall due to hypoalbuminemia which is worsening her third spacing.  For now supportive care, once blood pressure stabilizes resume diuretics, paracentesis has been done, continue protein supplementation through albumin and oral protein supplements.  INR levels relatively stable.  Continue to monitor closely for now if hypotensive will replace fluids. ? ?2.  Severe hypokalemia.  Due to fluid loss as a #1 above along with Lasix  use.  Aggressively replaced along with magnesium. ? ?3.  Morbid obesity with BMI of 44.  Follow with PCP for weight loss. ? ?4.  History of adrenal insufficiency diagnosed 2 admissions ago.  Somehow she was not taking her prescribed steroids, random cortisol 1.4 despite severe hypertension, placed on Decadron 10 mg daily with outpatient endocrine follow-up. ? ?5.  Macrocytic anemia.  Outpatient follow-up with PCP.  Recent B12  and folate levels were stable ? ?6.  Seizures.  On Keppra. ? ?7.  Recent history of distal left femur fracture.  Being managed conservatively.  Weightbearing as tolerated.  PT OT.  May require placement. ? ?  ? ?   ? ?Condition - Extremely Guarded ? ?Family Communication  :  None present ? ?Code Status :  Full ? ?Consults  :  GI ? ?PUD Prophylaxis :  ? ? Procedures  :    ? ?CT - Cirrhosis of the liver. Previous cholecystectomy. Chronic intra and extrahepatic ductal dilatation without obstructing lesion. Marked increase in the amount of ascites which is freely distributed. Ascites extends through an umbilical region hernia to the skin surface in this is probably the source of fluid leakage. Lumbar region degenerative changes. Inferior endplate compression fracture at L2, progressive since the study of 01/08/2021. Loss of height of only 10-20%. ? ? ?   ? ?Disposition Plan  :   ? ?Status is: Inpatient ? ?DVT Prophylaxis  :   ? ?SCDs Start: 04/08/21 0010 ? ? ?Lab Results  ?Component Value Date  ? PLT 93 (L) 04/08/2021  ? ? ?Diet :  ?Diet Order   ? ?       ?  Diet 2 gram sodium Room service appropriate? Yes; Fluid consistency: Thin; Fluid restriction: 1200 mL Fluid  Diet effective now       ?  ? ?  ?  ? ?  ?  ? ?Inpatient Medications ? ?Scheduled Meds: ? ALPRAZolam  0.5 mg Oral BID  ? dexamethasone (DECADRON) injection  10 mg Intravenous Daily  ? levETIRAcetam  500 mg Oral BID  ? mometasone-formoterol  2 puff Inhalation BID  ? potassium chloride  40 mEq Oral BID  ? primidone  100 mg Oral q morning   ? traZODone  100 mg Oral QHS  ? ?Continuous Infusions: ? albumin human 25 g (04/08/21 0503)  ? cefTRIAXone (ROCEPHIN)  IV Stopped (04/08/21 0214)  ? potassium chloride 10 mEq (04/08/21 1000)  ? ?PRN Meds:. ? ?Antibiotics  :   ? ?Anti-infectives (From admission, onward)  ? ? Start     Dose/Rate Route Frequency Ordered Stop  ? 04/08/21 0030  cefTRIAXone (ROCEPHIN) 2 g in sodium chloride 0.9 % 100 mL IVPB       ? 2 g ?200 mL/hr over 30 Minutes Intravenous Daily at bedtime 04/08/21 0011    ? ?  ? ? ? Time Spent in minutes  30 ? ? ?Lala Lund M.D on 04/08/2021 at 10:02 AM ? ?To page go to [email or web address]  ? ?[facility] -  Office  [phone] ? ?See all Orders from today for further details ? ? ? Objective:  ? ?Vitals:  ? 04/08/21 0830 04/08/21 0900 04/08/21 0915 04/08/21 0930  ?BP: 109/62 (!) 108/52  (!) 102/59  ?Pulse: 83 73 74 74  ?Resp: 14 (!) '9 14 12  '$ ?Temp:      ?TempSrc:      ?SpO2: 100% 100% 98% 99%  ?Weight:      ?Height:      ? ? ?Wt Readings from Last 3 Encounters:  ?04/07/21 106.1 kg  ?03/29/21 106.2 kg  ?02/15/21 125.1 kg  ? ? ? ?Intake/Output Summary (Last 24 hours) at 04/08/2021 1002 ?Last data filed at 04/08/2021 5621 ?Gross per 24 hour  ?Intake 350.29 ml  ?Output --  ?Net 350.29 ml  ? ? ? ?Physical Exam ? ?Awake Alert, No new F.N deficits, Normal affect ?[place].AT,PERRAL ?Supple  Neck, No JVD,   ?Symmetrical Chest wall movement, Good air movement bilaterally, CTAB ?RRR,No Gallops,Rubs or new Murmurs,  ?+ve B.Sounds, Abd Soft, No tenderness, plical hernia with oozing ascitic fluid around the site  ?no Cyanosis, Clubbing or edema  ?  ? ? Data Review:  ? ? ?CBC ?Recent Labs  ?Lab 04/07/21 ?1425 04/08/21 ?0500  ?WBC 5.3 4.6  ?HGB 12.1 10.5*  ?HCT 36.3 32.0*  ?PLT 113* 93*  ?MCV 100.6* 100.9*  ?MCH 33.5 33.1  ?MCHC 33.3 32.8  ?RDW 14.7 14.7  ?LYMPHSABS 0.8 0.9  ?MONOABS 0.4 0.5  ?EOSABS 0.1 0.1  ?BASOSABS 0.0 0.0  ? ? ?Electrolytes ?Recent Labs  ?Lab 04/06/21 ?1302 04/07/21 ?1425 04/07/21 ?1826 04/07/21 ?1837  04/07/21 ?2041 04/08/21 ?0500  ?NA  --  137  --   --   --  137  ?K  --  2.7*  --   --   --  2.7*  ?CL  --  98  --   --   --  101  ?CO2  --  30  --   --   --  28  ?GLUCOSE  --  116*  --   --   --  96  ?BUN  --  6*  --   --   --  7*  ?CREATININE 1.20* 1.16*  --   --   --  1.01*  ?CALCIUM  --  8.1*  --   --   --  7.7*  ?AST  --  30  --   --   --  24  ?ALT  --  18  --   --   --  14  ?ALKPHOS  --  130*  --   --   --  87  ?BILITOT  --  1.5*  --   --   --  1.0  ?ALBUMIN  --  2.2*  --   --   --  2.2*  ?MG  --   --  1.9  --   --   --   ?LATICACIDVEN  --   --   --  2.2* 2.1*  --   ?INR  --   --  1.5*  --   --   --   ? ?  ? ? ?Radiology Reports ?CT Abdomen Pelvis W Contrast ? ?Result Date: 04/07/2021 ?CLINICAL DATA:  Abdominal wall abscess. Follow-up study of January. Draining fluid from the umbilicus. EXAM: CT ABDOMEN AND PELVIS WITH CONTRAST TECHNIQUE: Multidetector CT imaging of the abdomen and pelvis was performed using the standard protocol following bolus administration of intravenous contrast. RADIATION DOSE REDUCTION: This exam was performed according to the departmental dose-optimization program which includes automated exposure control, adjustment of the mA and/or kV according to patient size and/or use of iterative reconstruction technique. CONTRAST:  146m OMNIPAQUE IOHEXOL 300 MG/ML  SOLN COMPARISON:  02/01/2021.  01/08/2021. FINDINGS: Lower chest: Lung bases are clear. No pleural or pericardial fluid. Pericardial lymph node as seen previously measuring 1 cm in size. Hepatobiliary: Cirrhosis of the liver as seen previously. No focal liver parenchymal mass. Previous cholecystectomy. Chronic prominence of the inter and extrahepatic biliary ducts without evidence of obstructing lesion. Marked increase in the amount ascites. Pancreas: Atrophic changes.  No acute finding. Spleen: Size within normal limits.  No focal lesion. Adrenals/Urinary Tract: Adrenal glands are normal. Nonobstructing 3 mm stone in the upper pole the  left kidney. Few small liver cysts which do not require further follow-up. No hydronephrosis. Stomach/Bowel:  Stomach and small intestine are normal. No colon pathology. Vascular/Lymphatic: Aorta shows

## 2021-04-08 NOTE — Progress Notes (Signed)
Echocardiogram ?2D Echocardiogram has been performed. ? ?Arlyss Gandy ?04/08/2021, 11:38 AM ?

## 2021-04-08 NOTE — ED Notes (Signed)
Pt has moderate serous drainage coming from umbilical area. ?

## 2021-04-09 DIAGNOSIS — K7290 Hepatic failure, unspecified without coma: Secondary | ICD-10-CM | POA: Diagnosis not present

## 2021-04-09 DIAGNOSIS — K7460 Unspecified cirrhosis of liver: Secondary | ICD-10-CM | POA: Diagnosis not present

## 2021-04-09 LAB — CBC WITH DIFFERENTIAL/PLATELET
Abs Immature Granulocytes: 0.02 10*3/uL (ref 0.00–0.07)
Basophils Absolute: 0 10*3/uL (ref 0.0–0.1)
Basophils Relative: 0 %
Eosinophils Absolute: 0 10*3/uL (ref 0.0–0.5)
Eosinophils Relative: 0 %
HCT: 30.3 % — ABNORMAL LOW (ref 36.0–46.0)
Hemoglobin: 10.1 g/dL — ABNORMAL LOW (ref 12.0–15.0)
Immature Granulocytes: 0 %
Lymphocytes Relative: 10 %
Lymphs Abs: 0.7 10*3/uL (ref 0.7–4.0)
MCH: 33.2 pg (ref 26.0–34.0)
MCHC: 33.3 g/dL (ref 30.0–36.0)
MCV: 99.7 fL (ref 80.0–100.0)
Monocytes Absolute: 0.5 10*3/uL (ref 0.1–1.0)
Monocytes Relative: 8 %
Neutro Abs: 5.7 10*3/uL (ref 1.7–7.7)
Neutrophils Relative %: 82 %
Platelets: 91 10*3/uL — ABNORMAL LOW (ref 150–400)
RBC: 3.04 MIL/uL — ABNORMAL LOW (ref 3.87–5.11)
RDW: 14.6 % (ref 11.5–15.5)
WBC: 6.9 10*3/uL (ref 4.0–10.5)
nRBC: 0.3 % — ABNORMAL HIGH (ref 0.0–0.2)

## 2021-04-09 LAB — COMPREHENSIVE METABOLIC PANEL
ALT: 11 U/L (ref 0–44)
AST: 23 U/L (ref 15–41)
Albumin: 2.5 g/dL — ABNORMAL LOW (ref 3.5–5.0)
Alkaline Phosphatase: 79 U/L (ref 38–126)
Anion gap: 6 (ref 5–15)
BUN: 9 mg/dL (ref 8–23)
CO2: 24 mmol/L (ref 22–32)
Calcium: 8.1 mg/dL — ABNORMAL LOW (ref 8.9–10.3)
Chloride: 109 mmol/L (ref 98–111)
Creatinine, Ser: 0.93 mg/dL (ref 0.44–1.00)
GFR, Estimated: >60 mL/min (ref >60)
Glucose, Bld: 124 mg/dL — ABNORMAL HIGH (ref 70–99)
Potassium: 4.1 mmol/L (ref 3.5–5.1)
Sodium: 139 mmol/L (ref 135–145)
Total Bilirubin: 0.5 mg/dL (ref 0.3–1.2)
Total Protein: 5 g/dL — ABNORMAL LOW (ref 6.5–8.1)

## 2021-04-09 LAB — MAGNESIUM: Magnesium: 2.1 mg/dL (ref 1.7–2.4)

## 2021-04-09 LAB — URINE CULTURE

## 2021-04-09 MED ORDER — MIDODRINE HCL 5 MG PO TABS
5.0000 mg | ORAL_TABLET | Freq: Three times a day (TID) | ORAL | Status: DC
  Administered 2021-04-09 – 2021-04-10 (×6): 5 mg via ORAL
  Filled 2021-04-09 (×6): qty 1

## 2021-04-09 MED ORDER — SPIRONOLACTONE 25 MG PO TABS
100.0000 mg | ORAL_TABLET | Freq: Every day | ORAL | Status: DC
Start: 2021-04-09 — End: 2021-04-10
  Administered 2021-04-09: 100 mg via ORAL
  Filled 2021-04-09: qty 4

## 2021-04-09 MED ORDER — LACTATED RINGERS IV BOLUS
250.0000 mL | Freq: Once | INTRAVENOUS | Status: AC
Start: 2021-04-09 — End: 2021-04-09
  Administered 2021-04-09: 250 mL via INTRAVENOUS

## 2021-04-09 MED ORDER — FUROSEMIDE 40 MG PO TABS
40.0000 mg | ORAL_TABLET | Freq: Every day | ORAL | Status: DC
  Administered 2021-04-09: 40 mg via ORAL
  Filled 2021-04-09: qty 1

## 2021-04-09 NOTE — Progress Notes (Signed)
?  Transition of Care (TOC) Screening Note ? ? ?Patient Details  ?Name: Carla Leonard ?Date of Birth: October 25, 1955 ? ? ?Transition of Care (TOC) CM/SW Contact:    ?Benard Halsted, LCSW ?Phone Number: ?04/09/2021, 9:31 AM ? ? ? ?Transition of Care Department [facility]) has reviewed patient and no TOC needs have been identified at this time. We will continue to monitor patient advancement through interdisciplinary progression rounds. If new patient transition needs arise, please place a TOC consult. ? ? ?

## 2021-04-09 NOTE — Evaluation (Signed)
Occupational Therapy Evaluation ?Patient Details ?Name: Carla Leonard ?MRN: [id number] ?DOB: 1955/04/15 ?Today's Date: 04/09/2021 ? ? ?History of Present Illness Patient is a 66 year old female admitted with ascites fluid leaking around umbilical hernia causing dehydration, hypokalemia. PMH: seizures on Keppra, hypertension, chronic kidney disease stage III recently admitted twice in February 2023, once for abdominal wall cellulitis second one for closed fracture of the femur managed nonoperatively  ? ?Clinical Impression ?  ?Patient lives home alone in an apartment with her dog. Has a very supportive daughter that gets her what she needs, also has groceries delivered to her apartment through [employer]. Patient demonstrates ability to perform self care tasks and functional transfers without assistance. BP in bed 82/54, at edge of bed 109/60 with patient denying dizziness throughout session. No further acute OT needs, continue to encourage out of bed activity as able. Will sign off.   ?   ? ?Recommendations for follow up therapy are one component of a multi-disciplinary discharge planning process, led by the attending physician.  Recommendations may be updated based on patient status, additional functional criteria and insurance authorization.  ? ?Follow Up Recommendations ? No OT follow up  ?  ?Assistance Recommended at Discharge PRN  ?   ?Functional Status Assessment ? Patient has not had a recent decline in their functional status  ?Equipment Recommendations ? None recommended by OT  ?  ?   ?Precautions / Restrictions Precautions ?Precaution Comments: monitor BP ?Restrictions ?Weight Bearing Restrictions: No  ? ?  ? ?Mobility Bed Mobility ?Overal bed mobility: Modified Independent ?  ?  ?  ?  ?  ?  ?  ?  ? ?Transfers ?Overall transfer level: Independent ?Equipment used: None ?  ?  ?  ?  ?  ?  ?  ?  ?  ? ?  ?   ? ?ADL either performed or assessed with clinical judgement  ? ?ADL Overall ADL's : At baseline ?  ?  ?  ?   ?  ?  ?  ?  ?  ?  ?  ?  ?  ?  ?  ?  ?  ?  ?  ?General ADL Comments: Patient demonstrates ability to perform lower body dressing, functional transfers to/from bedside commode without any physical assistance. Patient reports her apartment is two rooms and she usually "pitters" around at home, does not go out much at all. Has groceries delievered and if she needs other things daughter gets it for her.  ? ? ? ? ?Pertinent Vitals/Pain Pain Assessment ?Pain Assessment: No/denies pain  ? ? ? ?Hand Dominance Right ?  ?Extremity/Trunk Assessment Upper Extremity Assessment ?Upper Extremity Assessment: Overall WFL for tasks assessed ?  ?Lower Extremity Assessment ?Lower Extremity Assessment: Defer to PT evaluation ?  ?  ?  ?Communication Communication ?Communication: No difficulties ?  ?Cognition Arousal/Alertness: Awake/alert ?Behavior During Therapy: [facility] for tasks assessed/performed ?Overall Cognitive Status: Within Functional Limits for tasks assessed ?  ?  ?  ?  ?  ?  ?  ?  ?  ?  ?  ?  ?  ?  ?  ?  ?General Comments: Patient very pleasant, tangential ?  ?  ?General Comments  BP semi-supine 82/54. BP sitting at edge of bed 109/60. Patient denies dizziness throughout session ? ?  ?   ?   ? ? ?Home Living Family/patient expects to be discharged to:: Private residence ?Living Arrangements: Non-relatives/Friends ?Available Help at Discharge: Family;Friend(s);Available PRN/intermittently ?Type of  Home: Apartment ?Home Access: Stairs to enter ?Entrance Stairs-Number of Steps: 1 ?Entrance Stairs-Rails: Right ?Home Layout: One level ?  ?  ?Bathroom Shower/Tub: Tub/shower unit;Sponge bathes at baseline ?  ?Bathroom Toilet: Standard ?  ?  ?Home Equipment: Cane - single point;Rollator (4 wheels) ?  ?Additional Comments: daughter assists as needed, friend at apartment complex assists as needed ?  ? ?  ?Prior Functioning/Environment Prior Level of Function : Independent/Modified Independent ?  ?  ?  ?  ?  ?  ?Mobility Comments: pt  reports ind with household and community ambulation, SPC and rollator occassionally; hasn't been ambulatory since previous hospitalization ?ADLs Comments: pt reports ind with ADLs/IADLs, sponge baths, daughter assists with household chores as needed ?  ? ?  ?  ?OT Problem List: Decreased activity tolerance ?  ?   ?   ?OT Goals(Current goals can be found in the care plan section) Acute Rehab OT Goals ?Patient Stated Goal: Home when I can ?OT Goal Formulation: All assessment and education complete, DC therapy  ? ?AM-PAC OT "6 Clicks" Daily Activity     ?Outcome Measure Help from another person eating meals?: None ?Help from another person taking care of personal grooming?: None ?Help from another person toileting, which includes using toliet, bedpan, or urinal?: None ?Help from another person bathing (including washing, rinsing, drying)?: None ?Help from another person to put on and taking off regular upper body clothing?: None ?Help from another person to put on and taking off regular lower body clothing?: None ?6 Click Score: 24 ?  ?End of Session Nurse Communication: Mobility status ? ?Activity Tolerance: Patient tolerated treatment well ?Patient left: in bed;with call bell/phone within reach ? ?OT Visit Diagnosis: Other abnormalities of gait and mobility (R26.89)  ?              ?Time: 9326-7124 ?OT Time Calculation (min): 17 min ?Charges:  OT General Charges ?$OT Visit: 1 Visit ?OT Evaluation ?$OT Eval Low Complexity: 1 Low ? ?Delbert Phenix OT ?OT pager: [phone] ? ?Rosemary Holms ?04/09/2021, 12:25 PM ?

## 2021-04-09 NOTE — Evaluation (Signed)
Physical Therapy One time Evaluation ?Patient Details ?Name: Carla Leonard ?MRN: [id number] ?DOB: June 08, 1955 ?Today's Date: 04/09/2021 ? ?History of Present Illness ? Patient is a 66 year old female admitted with ascites fluid leaking around umbilical hernia causing dehydration, hypokalemia. PMH: seizures on Keppra, hypertension, chronic kidney disease stage III recently admitted twice in February 2023, once for abdominal wall cellulitis second one for closed fracture of the femur managed nonoperatively  ?Clinical Impression ? Pt admitted with above diagnosis. Pt without LOB and ambulates independently with challenges to balance.  Pt does not need skilled PT at this time.   ? ?Recommendations for follow up therapy are one component of a multi-disciplinary discharge planning process, led by the attending physician.  Recommendations may be updated based on patient status, additional functional criteria and insurance authorization. ? ?Follow Up Recommendations No PT follow up ? ?  ?Assistance Recommended at Discharge None  ?Patient can return home with the following ? Assistance with cooking/housework;Assist for transportation ? ?  ?Equipment Recommendations None recommended by PT  ?Recommendations for Other Services ?    ?  ?Functional Status Assessment Patient has not had a recent decline in their functional status  ? ?  ?Precautions / Restrictions Precautions ?Precaution Comments: monitor BP ?Restrictions ?Weight Bearing Restrictions: No  ? ?  ? ?Mobility ? Bed Mobility ?Overal bed mobility: Modified Independent ?  ?  ?  ?  ?  ?  ?  ?  ? ?Transfers ?Overall transfer level: Independent ?Equipment used: None ?  ?  ?  ?  ?  ?  ?  ?  ?  ? ?Ambulation/Gait ?Ambulation/Gait assistance: Independent ?Gait Distance (Feet): 45 Feet ?Assistive device: None ?Gait Pattern/deviations: Step-through pattern, Decreased stride length ?  ?Gait velocity interpretation: <1.8 ft/sec, indicate of risk for recurrent falls ?  ?General Gait  Details: Pt did not want to leave room but did do laps and can withstand challenges to balance.  Pt able to bend over and reach for items in standing as well. ? ?Stairs ?  ?  ?  ?  ?  ? ?Wheelchair Mobility ?  ? ?Modified Rankin (Stroke Patients Only) ?  ? ?  ? ?Balance Overall balance assessment: Needs assistance ?Sitting-balance support: No upper extremity supported, Feet supported ?Sitting balance-Leahy Scale: Good ?  ?  ?Standing balance support: No upper extremity supported, During functional activity ?Standing balance-Leahy Scale: Good ?  ?  ?  ?  ?  ?  ?  ?  ?  ?  ?  ?  ?   ? ? ? ?Pertinent Vitals/Pain Pain Assessment ?Pain Assessment: No/denies pain  ? ? ?Home Living Family/patient expects to be discharged to:: Private residence ?Living Arrangements: Non-relatives/Friends ?Available Help at Discharge: Family;Friend(s);Available PRN/intermittently ?Type of Home: Apartment ?Home Access: Stairs to enter ?Entrance Stairs-Rails: Right ?Entrance Stairs-Number of Steps: 1 ?  ?Home Layout: One level ?Home Equipment: Cane - single point;Rollator (4 wheels) ?Additional Comments: daughter assists as needed, friend at apartment complex assists as needed  ?  ?Prior Function Prior Level of Function : Independent/Modified Independent ?  ?  ?  ?  ?  ?  ?Mobility Comments: pt reports ind with household and community ambulation, SPC and rollator occassionally; hasn't been ambulatory since previous hospitalization ?ADLs Comments: pt reports ind with ADLs/IADLs, sponge baths, daughter assists with household chores as needed ?  ? ? ?Hand Dominance  ? Dominant Hand: Right ? ?  ?Extremity/Trunk Assessment  ? Upper Extremity Assessment ?Upper Extremity Assessment:  Defer to OT evaluation ?  ? ?Lower Extremity Assessment ?Lower Extremity Assessment: Generalized weakness ?  ? ?Cervical / Trunk Assessment ?Cervical / Trunk Assessment: Normal  ?Communication  ? Communication: No difficulties  ?Cognition Arousal/Alertness:  Awake/alert ?Behavior During Therapy: [facility] for tasks assessed/performed ?Overall Cognitive Status: Within Functional Limits for tasks assessed ?  ?  ?  ?  ?  ?  ?  ?  ?  ?  ?  ?  ?  ?  ?  ?  ?General Comments: Patient very pleasant, tangential ?  ?  ? ?  ?General Comments General comments (skin integrity, edema, etc.): VSS ? ?  ?Exercises    ? ?Assessment/Plan  ?  ?PT Assessment Patient does not need any further PT services  ?PT Problem List   ? ?   ?  ?PT Treatment Interventions     ? ?PT Goals (Current goals can be found in the Care Plan section)  ?Acute Rehab PT Goals ?Patient Stated Goal: to go home ?PT Goal Formulation: With patient ?Time For Goal Achievement: 04/23/21 ?Potential to Achieve Goals: Good ? ?  ?Frequency   ?  ? ? ?Co-evaluation   ?  ?  ?  ?  ? ? ?  ?AM-PAC PT "6 Clicks" Mobility  ?Outcome Measure Help needed turning from your back to your side while in a flat bed without using bedrails?: None ?Help needed moving from lying on your back to sitting on the side of a flat bed without using bedrails?: None ?Help needed moving to and from a bed to a chair (including a wheelchair)?: None ?Help needed standing up from a chair using your arms (e.g., wheelchair or bedside chair)?: None ?Help needed to walk in hospital room?: None ?Help needed climbing 3-5 steps with a railing? : None ?6 Click Score: 24 ? ?  ?End of Session   ?Activity Tolerance: Patient tolerated treatment well ?Patient left: in bed;with call bell/phone within reach ?Nurse Communication: Mobility status ?PT Visit Diagnosis: Muscle weakness (generalized) (M62.81) ?  ? ?Time: 9892-1194 ?PT Time Calculation (min) (ACUTE ONLY): 20 min ? ? ?Charges:   PT Evaluation ?$PT Eval Low Complexity: 1 Low ?  ?  ?   ? ? ?Anabeth Chilcott M,PT ?Acute Rehab Services ?[phone] ?[phone] (pager)  ? ?Alvira Philips ?04/09/2021, 3:41 PM ? ?

## 2021-04-09 NOTE — Consult Note (Signed)
? ? ? ?Carla Leonard ?1955-04-12  ?[id number].   ? ?Requesting MD: Dr. Lala Lund ?Chief Complaint/Reason for Consult: Umbilical hernia, Cirrhosis  ? ?HPI: Carla Leonard is a 66 y.o. female w/ a hx of alcoholic cirrhosis and adrenal insufficiency who presented for drainage from her umbilicus.  Patient was admitted from 1/30 - 02/05/2021 for abdominal wall cellulitis and fluid collection which spontaneously drained. On 3/27 she started noticing a scant amount of bleeding from umbilicus.  She was seen by GI.  An outpatient CT scan on 4/4 showed an umbilical hernia without evidence of incarceration or obstruction but ascites extending to skin surface. On 4/5 she began having clear leakage from her umbilicus prompting a visit to the ED.  She was noted to have soft BPs and she with IV fluid.  Also reported not be taking her steroids for her adrenal insufficiency and was placed on Decadron. [facility] admitted. GI was consulted and recommended paracentesis.  Paracentesis was attempted on 4/6 but at that time there was no significant ascites visualized on [place].  Patient has had no further drainage from her umbilicus this morning since dressing was changed.  She denies any pain at her umbilicus.  She is tolerating a diet without any nausea or vomiting.  She is passing flatus and had a bowel movement this morning.  History of prior cholecystectomy (open) and appendectomy. ? ?ROS: ?Review of Systems  ?Constitutional:  Negative for chills and fever.  ?Gastrointestinal:  Negative for abdominal pain, constipation, diarrhea, nausea and vomiting.  ? ?Family History  ?Problem Relation Age of Onset  ? Pneumonia Mother   ? Diabetes Father   ? Colon cancer Neg Hx   ? Esophageal cancer Neg Hx   ? Stomach cancer Neg Hx   ? Pancreatic cancer Neg Hx   ? Colon polyps Neg Hx   ? ? ?Past Medical History:  ?Diagnosis Date  ? Arthritis   ? Depression   ? History of kidney stones   ? Hypertension   ? ? ?Past Surgical History:  ?Procedure  Laterality Date  ? APPENDECTOMY    ? CHOLECYSTECTOMY    ? ELBOW SURGERY    ? RIGHT  ? JOINT REPLACEMENT    ? right TKA 06/2010  ? KNEE ARTHROPLASTY  06/17/2011  ? Procedure: COMPUTER ASSISTED TOTAL KNEE ARTHROPLASTY;  Surgeon: Alta Corning, MD;  Location: [facility];  Service: Orthopedics;  Laterality: Left;  TOTAL KNEE REPLACEMENT WITH GENERAL ANESTHESIA AND PRE OP FEMORAL NERVE BLOCK  ? PERIANAL CYST    ? ? ?Social History:  reports that she has been smoking cigarettes. She has been smoking an average of .25 packs per day. She does not have any smokeless tobacco history on file. She reports current alcohol use. She reports that she does not use drugs. ? ?Allergies:  ?Allergies  ?Allergen Reactions  ? Sulfa Antibiotics Anaphylaxis  ? Prednisone Other (See Comments)  ?  hallucinations  ? Nsaids Other (See Comments)  ?  Stomach pain, diarrhea  ? Other Other (See Comments)  ?  Steroids: hallucinations  ? ? ?Medications Prior to Admission  ?Medication Sig Dispense Refill  ? ALPRAZolam (XANAX) 0.5 MG tablet Take 0.5 mg by mouth at bedtime as needed for sleep.    ? budesonide-formoterol (SYMBICORT) 160-4.5 MCG/ACT inhaler Inhale 2 puffs into the lungs 2 (two) times daily as needed (shortness of breath/wheezing).    ? feeding supplement (BOOST HIGH PROTEIN) LIQD Take 1 Container by mouth 2 (two) times daily  as needed (appetite).    ? furosemide (LASIX) 20 MG tablet Take 20 mg by mouth 2 (two) times daily as needed for edema.    ? levETIRAcetam (KEPPRA) 500 MG tablet Take 500 mg by mouth 2 (two) times daily.    ? primidone (MYSOLINE) 50 MG tablet Take 100 mg by mouth every morning.    ? promethazine (PHENERGAN) 12.5 MG tablet Take 12.5 mg by mouth every 4 (four) hours as needed for nausea or vomiting.    ? traMADol (ULTRAM) 50 MG tablet Take 50 mg by mouth 2 (two) times daily as needed for moderate pain.    ? traZODone (DESYREL) 100 MG tablet Take 100 mg by mouth at bedtime as needed for sleep.    ? hydrocortisone (CORTEF) 5 MG  tablet Take 2 tablets ('10mg'$ ) every morning and take 1 tablet ('5mg'$ ) every night. (Patient not taking: Reported on 03/29/2021) 90 tablet 2  ? ? ? ?Physical Exam: ?Blood pressure 109/60, pulse 69, temperature 98 ?F (36.7 ?C), temperature source Oral, resp. rate 12, height 5' 1.5" (1.562 m), weight 109.5 kg, SpO2 98 %. ?General: pleasant, WD/WN white female who is laying in bed in NAD ?HEENT: head is normocephalic, atraumatic.  Sclera are noninjected.  PERRL.  Ears and nose without any masses or lesions.  Mouth is pink and moist. Dentition fair ?Heart: regular, rate, and rhythm.  Palpable radial pulses b/l ?Lungs: CTAB, no wheezes, rhonchi, or rales noted.  Respiratory effort nonlabored ?Abd:  Obese, soft, NT, umbilical hernia without open wound or drainage present. +BS ?MS: Trace LE edema. ?Skin: Bruising of arms. No rashes noted.  ?Psych: A&Ox4 with an appropriate affect ?Neuro: cranial nerves grossly intact, normal speech, thought process intact, moves all extremities, mobilizing in room.  ? ?Results for orders placed or performed during the hospital encounter of 04/07/21 (from the past 48 hour(s))  ?CBC with Differential     Status: Abnormal  ? Collection Time: 04/07/21  2:25 PM  ?Result Value Ref Range  ? WBC 5.3 4.0 - 10.5 K/uL  ? RBC 3.61 (L) 3.87 - 5.11 MIL/uL  ? Hemoglobin 12.1 12.0 - 15.0 g/dL  ? HCT 36.3 36.0 - 46.0 %  ? MCV 100.6 (H) 80.0 - 100.0 fL  ? MCH 33.5 26.0 - 34.0 pg  ? MCHC 33.3 30.0 - 36.0 g/dL  ? RDW 14.7 11.5 - 15.5 %  ? Platelets 113 (L) 150 - 400 K/uL  ?  Comment: Immature Platelet Fraction may be ?clinically indicated, consider ?ordering this additional test ?[id number] ?REPEATED TO VERIFY ?  ? nRBC 0.0 0.0 - 0.2 %  ? Neutrophils Relative % 74 %  ? Neutro Abs 4.0 1.7 - 7.7 K/uL  ? Lymphocytes Relative 15 %  ? Lymphs Abs 0.8 0.7 - 4.0 K/uL  ? Monocytes Relative 8 %  ? Monocytes Absolute 0.4 0.1 - 1.0 K/uL  ? Eosinophils Relative 2 %  ? Eosinophils Absolute 0.1 0.0 - 0.5 K/uL  ? Basophils Relative  1 %  ? Basophils Absolute 0.0 0.0 - 0.1 K/uL  ? Immature Granulocytes 0 %  ? Abs Immature Granulocytes 0.02 0.00 - 0.07 K/uL  ?  Comment: Performed at [facility], [place] [address] [place], [place] [address]  ?Comprehensive metabolic panel     Status: Abnormal  ? Collection Time: 04/07/21  2:25 PM  ?Result Value Ref Range  ? Sodium 137 135 - 145 mmol/L  ? Potassium 2.7 (LL) 3.5 - 5.1 mmol/L  ?  Comment: CRITICAL RESULT CALLED TO, READ BACK BY AND VERIFIED WITH: ?B.BECK RN AT 3664 ON 04/07/21 BY B.DEAN ?  ? Chloride 98 98 - 111 mmol/L  ? CO2 30 22 - 32 mmol/L  ? Glucose, Bld 116 (H) 70 - 99 mg/dL  ?  Comment: Glucose reference range applies only to samples taken after fasting for at least 8 hours.  ? BUN 6 (L) 8 - 23 mg/dL  ? Creatinine, Ser 1.16 (H) 0.44 - 1.00 mg/dL  ? Calcium 8.1 (L) 8.9 - 10.3 mg/dL  ? Total Protein 6.5 6.5 - 8.1 g/dL  ? Albumin 2.2 (L) 3.5 - 5.0 g/dL  ? AST 30 15 - 41 U/L  ? ALT 18 0 - 44 U/L  ? Alkaline Phosphatase 130 (H) 38 - 126 U/L  ? Total Bilirubin 1.5 (H) 0.3 - 1.2 mg/dL  ? GFR, Estimated 52 (L) >60 mL/min  ?  Comment: (NOTE) ?Calculated using the CKD-EPI Creatinine Equation (2021) ?  ? Anion gap 9 5 - 15  ?  Comment: Performed at [facility], [place] [address] [place], [place] [address]  ?Lipase, blood     Status: None  ? Collection Time: 04/07/21  2:25 PM  ?Result Value Ref Range  ? Lipase 38 11 - 51 U/L  ?  Comment: Performed at [facility], [place] [address], [place], [place] [address]  ?Resp Panel by RT-PCR (Flu A&B, Covid) Nasopharyngeal Swab     Status: None  ? Collection Time: 04/07/21  6:18 PM  ? Specimen: Nasopharyngeal Swab; Nasopharyngeal(NP) swabs in vial transport medium  ?Result Value Ref Range  ? SARS Coronavirus 2 by RT PCR NEGATIVE NEGATIVE  ?  Comment: (NOTE) ?SARS-CoV-2 target nucleic acids are NOT DETECTED. ? ?The SARS-CoV-2 RNA is generally detectable in upper respiratory ?specimens during the acute phase of infection. The lowest ?concentration of  SARS-CoV-2 viral copies this assay can detect is ?138 copies/mL. A negative result does not preclude SARS-Cov-2 ?infection and should not be used as the sole basis for treatment or ?other patient management decis

## 2021-04-09 NOTE — Progress Notes (Signed)
Pt's dressing to umbilicus was changed twice this shift d/t drainage.  ?

## 2021-04-09 NOTE — Progress Notes (Signed)
HOSPITAL MEDICINE OVERNIGHT EVENT NOTE   ? ?Nursing reporting that patient is exhibiting worsening hypotension with systolic blood pressure of 83/37. ? ?Chart reviewed, concern for intravascular volume depletion due to ongoing ascitic fluid leaking around umbilical hernia. ? ?We will provide a small 250 cc bolus of lactated Ringer solution and reassess. ? ?Carla Emerald  MD ?[facility]  ? ? ? ? ? ? ? ? ? ? ?

## 2021-04-09 NOTE — Progress Notes (Signed)
Subjective: ?Fluid oozing from the umbilical hernia. ? ?Objective: ?Vital signs in last 24 hours: ?Temp:  [97.7 ?F (36.5 ?C)-98.4 ?F (36.9 ?C)] 98.4 ?F (36.9 ?C) (04/07 0400) ?Pulse Rate:  [73-93] 74 (04/07 0400) ?Resp:  [9-22] 16 (04/07 0400) ?BP: (86-114)/(46-71) 86/47 (04/07 0400) ?SpO2:  [93 %-100 %] 94 % (04/07 0739) ?Weight:  [109.5 kg-111.1 kg] 109.5 kg (04/07 0400) ?  ? ?Intake/Output from previous day: ?04/06 0701 - 04/07 0700 ?In: 1007.1 [P.O.:237; IV Piggyback:770.1] ?Out: -  ?Intake/Output this shift: ?No intake/output data recorded. ? ?General appearance: alert and no distress ?GI: obese, soft, dry around the umbilical hernia ? ?Lab Results: ?Recent Labs  ?  04/07/21 ?1425 04/08/21 ?0500 04/09/21 ?0236  ?WBC 5.3 4.6 6.9  ?HGB 12.1 10.5* 10.1*  ?HCT 36.3 32.0* 30.3*  ?PLT 113* 93* 91*  ? ?BMET ?Recent Labs  ?  04/07/21 ?1425 04/08/21 ?0500 04/08/21 ?1830 04/09/21 ?0236  ?NA 137 137  --  139  ?K 2.7* 2.7* 3.9 4.1  ?CL 98 101  --  109  ?CO2 30 28  --  24  ?GLUCOSE 116* 96  --  124*  ?BUN 6* 7*  --  9  ?CREATININE 1.16* 1.01*  --  0.93  ?CALCIUM 8.1* 7.7*  --  8.1*  ? ?LFT ?Recent Labs  ?  04/08/21 ?0500 04/09/21 ?0236  ?PROT 5.4* 5.0*  ?ALBUMIN 2.2* 2.5*  ?AST 24 23  ?ALT 14 11  ?ALKPHOS 87 79  ?BILITOT 1.0 0.5  ?BILIDIR 0.3*  --   ?IBILI 0.7  --   ? ?PT/INR ?Recent Labs  ?  04/07/21 ?1826  ?LABPROT 18.0*  ?INR 1.5*  ? ?Hepatitis Panel ?No results for input(s): HEPBSAG, HCVAB, HEPAIGM, HEPBIGM in the last 72 hours. ?C-Diff ?No results for input(s): CDIFFTOX in the last 72 hours. ?Fecal Lactopherrin ?No results for input(s): FECLLACTOFRN in the last 72 hours. ? ?Studies/Results: ?DG Chest Port 1 View ? ?Result Date: 04/07/2021 ?CLINICAL DATA:  Questionable sepsis - evaluate for abnormality Leaking fluid from umbilicus. EXAM: PORTABLE CHEST 1 VIEW COMPARISON:  Chest radiograph 11/13/2020, lung bases from abdominal CT yesterday. FINDINGS: The cardiomediastinal contours are normal for portable technique. The  lungs are clear. Pulmonary vasculature is normal. No consolidation, pleural effusion, or pneumothorax. No acute osseous abnormalities are seen. IMPRESSION: No acute abnormality. Electronically Signed   By: Keith Rake M.D.   On: 04/07/2021 18:50  ? ?ECHOCARDIOGRAM COMPLETE ? ?Result Date: 04/08/2021 ?   ECHOCARDIOGRAM REPORT   Patient Name:   Carla Leonard Date of Exam: 04/08/2021 Medical Rec #:  [id number]        Height:       61.0 in Accession #:    [id number]       Weight:       234.0 lb Date of Birth:  [birth year]        BSA:          2.019 m? Patient Age:    66 years         BP:           114/54 mmHg Patient Gender: F                HR:           79 bpm. Exam Location:  Inpatient Procedure: 2D Echo Indications:    Dyspnea  History:        Patient has no prior history of Echocardiogram examinations.  Risk Factors:Hypertension.  Sonographer:    Arlyss Gandy Referring Phys: Dulce  Sonographer Comments: Image acquisition challenging due to patient body habitus. IMPRESSIONS  1. Left ventricular ejection fraction, by estimation, is 65 to 70%. Left ventricular ejection fraction by PLAX is 66 %. The left ventricle has normal function. The left ventricle has no regional wall motion abnormalities. Left ventricular diastolic parameters were normal.  2. Right ventricular systolic function is normal. The right ventricular size is normal.  3. The mitral valve is abnormal. Trivial mitral valve regurgitation.  4. The aortic valve is tricuspid. Aortic valve regurgitation is not visualized. Aortic valve sclerosis is present, with no evidence of aortic valve stenosis. Aortic valve mean gradient measures 10.0 mmHg.  5. Increased flow velocities are likely secondary to hyperdynamic or high flow state such as cirrhosis/liver failure. Comparison(s): No prior Echocardiogram. FINDINGS  Left Ventricle: Left ventricular ejection fraction, by estimation, is 65 to 70%. Left ventricular ejection fraction  by PLAX is 66 %. The left ventricle has normal function. The left ventricle has no regional wall motion abnormalities. The left ventricular internal cavity size was normal in size. There is no left ventricular hypertrophy. Left ventricular diastolic parameters were normal. Right Ventricle: The right ventricular size is normal. No increase in right ventricular wall thickness. Right ventricular systolic function is normal. Left Atrium: Left atrial size was normal in size. Right Atrium: Right atrial size was normal in size. Pericardium: There is no evidence of pericardial effusion. Mitral Valve: The mitral valve is abnormal. There is mild thickening of the mitral valve leaflet(s). Trivial mitral valve regurgitation. Tricuspid Valve: The tricuspid valve is grossly normal. Tricuspid valve regurgitation is trivial. Aortic Valve: The aortic valve is tricuspid. Aortic valve regurgitation is not visualized. Aortic valve sclerosis is present, with no evidence of aortic valve stenosis. Aortic valve mean gradient measures 10.0 mmHg. Aortic valve peak gradient measures 17.5 mmHg. Aortic valve area, by VTI measures 1.57 cm?. Pulmonic Valve: The pulmonic valve was normal in structure. Pulmonic valve regurgitation is not visualized. Aorta: The aortic root and ascending aorta are structurally normal, with no evidence of dilitation. Venous: The inferior vena cava was not well visualized. IAS/Shunts: No atrial level shunt detected by color flow Doppler.  LEFT VENTRICLE PLAX 2D LV EF:         Left            Diastology                ventricular     LV e' medial:    11.40 cm/s                ejection        LV E/e' medial:  10.0                fraction by     LV e' lateral:   15.60 cm/s                PLAX is 66      LV E/e' lateral: 7.3                %. LVIDd:         4.40 cm LVIDs:         2.80 cm LV PW:         1.00 cm LV IVS:        1.00 cm LVOT diam:     2.00 cm LV SV:  78 LV SV Index:   39 LVOT Area:     3.14 cm?  RIGHT  VENTRICLE RV Basal diam:  3.00 cm RV Mid diam:    2.50 cm RV S prime:     14.70 cm/s TAPSE (M-mode): 2.6 cm LEFT ATRIUM             Index        RIGHT ATRIUM           Index LA diam:        3.70 cm 1.83 cm/m?   RA Area:     14.90 cm? LA Vol (A2C):   67.3 ml 33.33 ml/m?  RA Volume:   36.30 ml  17.98 ml/m? LA Vol (A4C):   48.2 ml 23.87 ml/m? LA Biplane Vol: 57.9 ml 28.68 ml/m?  AORTIC VALVE                     PULMONIC VALVE AV Area (Vmax):    1.58 cm?      PV Vmax:       1.28 m/s AV Area (Vmean):   1.60 cm?      PV Peak grad:  6.6 mmHg AV Area (VTI):     1.57 cm? AV Vmax:           209.00 cm/s AV Vmean:          147.000 cm/s AV VTI:            0.498 m AV Peak Grad:      17.5 mmHg AV Mean Grad:      10.0 mmHg LVOT Vmax:         105.00 cm/s LVOT Vmean:        75.000 cm/s LVOT VTI:          0.249 m LVOT/AV VTI ratio: 0.50  AORTA Ao Root diam: 2.80 cm MITRAL VALVE MV Area (PHT): 3.63 cm?     SHUNTS MV Decel Time: 209 msec     Systemic VTI:  0.25 m MV E velocity: 114.00 cm/s  Systemic Diam: 2.00 cm MV A velocity: 102.00 cm/s MV E/A ratio:  1.12 Lyman Bishop MD Electronically signed by Lyman Bishop MD Signature Date/Time: 04/08/2021/2:03:50 PM    Final   ? ?IR ABDOMEN US LIMITED ? ?Result Date: 04/08/2021 ?CLINICAL DATA:  Ascites EXAM: LIMITED ABDOMEN ULTRASOUND FOR ASCITES TECHNIQUE: Limited ultrasound survey for ascites was performed in all four abdominal quadrants. COMPARISON:  None. FINDINGS: No significant ascites. IMPRESSION: No significant abdominal ascites ultrasound. Electronically Signed   By: Miachel Roux M.D.   On: 04/08/2021 16:14  ? ?VAS [place] LOWER EXTREMITY VENOUS (DVT) ? ?Result Date: 04/08/2021 ? Lower Venous DVT Study Patient Name:  Carla Leonard  Date of Exam:   04/08/2021 Medical Rec #: [id number]         Accession #:    [id number] Date of Birth: 01-24-1955         Patient Gender: F Patient Age:   67 years Exam Location:  [facility] Procedure:      VAS [place] LOWER EXTREMITY VENOUS (DVT) Referring Phys:  Gean Birchwood --------------------------------------------------------------------------------  Indications: Edema.  Risk Factors: Ascites, CKD III, Abdominal wall cellulitis, alcoholic liver cirrhosis

## 2021-04-09 NOTE — Plan of Care (Signed)

## 2021-04-09 NOTE — Progress Notes (Signed)
?                                  PROGRESS NOTE                                             ?                                                                                                                     ?                                         ? ? Patient Demographics:  ? ? Carla Leonard, is a 66 y.o. female, DOB - [birth year], IRC:[id number] ? ?Outpatient Primary MD for the patient is Pcp, No    LOS - 2  Admit date - 04/07/2021   ? ?Chief Complaint  ?Patient presents with  ? drainage from umbilical area  ?    ? ?Brief Narrative (HPI from H&P)   66 y.o. female with history of seizures on Keppra, hypertension used to take Aldactone, chronic kidney disease stage III recently admitted twice in February 2023, once for abdominal wall cellulitis second one for closed fracture of the femur managed nonoperatively presents to the ER after patient noticed fluid oozing around her umbilicus, hypotension and severe weakness. ? ? Subjective:  ? ?Patient in bed, appears comfortable, denies any headache, no fever, no chest pain or pressure, no shortness of breath , no abdominal pain. No new focal weakness. ? ? Assessment  & Plan :  ? ?Ascites fluid leaking around umbilical hernia causing dehydration, hypokalemia in a patient with alcoholic liver cirrhosis. she has been admitted to the hospital, admitting physician had discussed her care with general surgeon Dr. Donne Hazel who recommended to get GI input for ascites management.  GI on board.  Her fluid output has gone down, surprisingly on abdominal ultrasound there is no sciatic fluid collection, will request general surgery to provide their opinion as well.  For now supportive care, once blood pressure stabilizes resume diuretics, continue protein supplementation through albumin and oral protein supplements.  INR levels relatively stable.  Continue to monitor closely for now if hypotensive will replace fluids. ? ?2.  Severe hypokalemia.  Due to  fluid loss as a #1 above along with Lasix use.  Replaced and now stable we will continue to monitor. ? ?3.  Morbid obesity with BMI of 44.  Follow with PCP for weight loss. ? ?4.  History of adrenal insufficiency diagnosed 2 admissions ago.  Somehow she was not taking her prescribed steroids, random cortisol 1.4 despite severe hypertension, placed on Decadron 10 mg daily with outpatient endocrine follow-up.  Also added midodrine for blood  pressure support. ? ?5.  Macrocytic anemia.  Outpatient follow-up with PCP.  Recent B12 and folate levels were stable ? ?6.  Seizures.  On Keppra. ? ?7.  Recent history of distal left femur fracture.  Being managed conservatively.  Weightbearing as tolerated.  PT OT.  May require placement. ? ?  ? ?   ? ?Condition - Extremely Guarded ? ?Family Communication  :  None present ? ?Code Status :  Full ? ?Consults  :  GI, CCS ? ?PUD Prophylaxis :  ? ? Procedures  :    ? ?TTE -  1. Left ventricular ejection fraction, by estimation, is 65 to 70%. Left ventricular ejection fraction by PLAX is 66 %. The left ventricle has normal function. The left ventricle has no regional wall motion abnormalities. Left ventricular diastolic parameters were normal.  2. Right ventricular systolic function is normal. The right ventricular size is normal.  3. The mitral valve is abnormal. Trivial mitral valve regurgitation.  4. The aortic valve is tricuspid. Aortic valve regurgitation is not visualized. Aortic valve sclerosis is present, with no evidence of aortic valve stenosis. Aortic valve mean gradient measures 10.0 mmHg.  5. Increased flow velocities are likely secondary to hyperdynamic or high flow state such as cirrhosis/liver failure ? ?Leg [place] - No DVT ? ?Abd [place] - No Ascites ? ?CT - Cirrhosis of the liver. Previous cholecystectomy. Chronic intra and extrahepatic ductal dilatation without obstructing lesion. Marked increase in the amount of ascites which is freely distributed. Ascites extends through an  umbilical region hernia to the skin surface in this is probably the source of fluid leakage. Lumbar region degenerative changes. Inferior endplate compression fracture at L2, progressive since the study of 01/08/2021. Loss of height of only 10-20%. ? ? ?   ? ?Disposition Plan  :   ? ?Status is: Inpatient ? ?DVT Prophylaxis  :   ? ?heparin injection 5,000 Units Start: 04/08/21 1400 ?SCDs Start: 04/08/21 0010 ? ? ?Lab Results  ?Component Value Date  ? PLT 91 (L) 04/09/2021  ? ? ?Diet :  ?Diet Order   ? ?       ?  Diet 2 gram sodium Room service appropriate? Yes; Fluid consistency: Thin; Fluid restriction: 1200 mL Fluid  Diet effective now       ?  ? ?  ?  ? ?  ?  ? ?Inpatient Medications ? ?Scheduled Meds: ? (feeding supplement) PROSource Plus  30 mL Oral BID BM  ? ALPRAZolam  0.5 mg Oral BID  ? dexamethasone (DECADRON) injection  10 mg Intravenous Daily  ? furosemide  40 mg Oral Daily  ? heparin injection (subcutaneous)  5,000 Units Subcutaneous Q8H  ? levETIRAcetam  500 mg Oral BID  ? midodrine  5 mg Oral TID WC  ? mometasone-formoterol  2 puff Inhalation BID  ? primidone  100 mg Oral q morning  ? spironolactone  100 mg Oral Daily  ? traZODone  100 mg Oral QHS  ? ?Continuous Infusions: ? cefTRIAXone (ROCEPHIN)  IV 2 g (04/08/21 2205)  ? ?PRN Meds:. ? ?Antibiotics  :   ? ?Anti-infectives (From admission, onward)  ? ? Start     Dose/Rate Route Frequency Ordered Stop  ? 04/08/21 0030  cefTRIAXone (ROCEPHIN) 2 g in sodium chloride 0.9 % 100 mL IVPB       ? 2 g ?200 mL/hr over 30 Minutes Intravenous Daily at bedtime 04/08/21 0011    ? ?  ? ? ? Time  Spent in minutes  30 ? ? ?Lala Lund M.D on 04/09/2021 at 10:31 AM ? ?To page go to [email or web address]  ? ?[facility] -  Office  [phone] ? ?See all Orders from today for further details ? ? ? Objective:  ? ?Vitals:  ? 04/08/21 2331 04/09/21 0400 04/09/21 0739 04/09/21 0757  ?BP: (!) 90/47 (!) 86/47  91/60  ?Pulse: 82 74  75  ?Resp:  16  17  ?Temp: 98.2 ?F (36.8 ?C)  98.4 ?F (36.9 ?C)  98.2 ?F (36.8 ?C)  ?TempSrc: Oral Axillary  Oral  ?SpO2: 93% 95% 94% 95%  ?Weight:  109.5 kg    ?Height:      ? ? ?Wt Readings from Last 3 Encounters:  ?04/09/21 109.5 kg  ?03/29/21 106.2 kg  ?02/15/21 125.1 kg  ? ? ? ?Intake/Output Summary (Last 24 hours) at 04/09/2021 1031 ?Last data filed at 04/08/2021 1700 ?Gross per 24 hour  ?Intake 756.83 ml  ?Output --  ?Net 756.83 ml  ? ? ? ?Physical Exam ? ?Awake Alert, No new F.N deficits, Normal affect ?[place].AT,PERRAL ?Supple Neck, No JVD,   ?Symmetrical Chest wall movement, Good air movement bilaterally, CTAB ?RRR,No Gallops,Rubs or new Murmurs,  ?+ve B.Sounds, Abd Soft, No tenderness, plical hernia with oozing ascitic fluid around the site  ?no Cyanosis, Clubbing or edema  ?  ? ? Data Review:  ? ? ?CBC ?Recent Labs  ?Lab 04/07/21 ?1425 04/08/21 ?0500 04/09/21 ?0236  ?WBC 5.3 4.6 6.9  ?HGB 12.1 10.5* 10.1*  ?HCT 36.3 32.0* 30.3*  ?PLT 113* 93* 91*  ?MCV 100.6* 100.9* 99.7  ?MCH 33.5 33.1 33.2  ?MCHC 33.3 32.8 33.3  ?RDW 14.7 14.7 14.6  ?LYMPHSABS 0.8 0.9 0.7  ?MONOABS 0.4 0.5 0.5  ?EOSABS 0.1 0.1 0.0  ?BASOSABS 0.0 0.0 0.0  ? ? ?Electrolytes ?Recent Labs  ?Lab 04/06/21 ?1302 04/07/21 ?1425 04/07/21 ?1826 04/07/21 ?1837 04/07/21 ?2041 04/08/21 ?0500 04/08/21 ?1830 04/09/21 ?0236  ?NA  --  137  --   --   --  137  --  139  ?K  --  2.7*  --   --   --  2.7* 3.9 4.1  ?CL  --  98  --   --   --  101  --  109  ?CO2  --  30  --   --   --  28  --  24  ?GLUCOSE  --  116*  --   --   --  96  --  124*  ?BUN  --  6*  --   --   --  7*  --  9  ?CREATININE 1.20* 1.16*  --   --   --  1.01*  --  0.93  ?CALCIUM  --  8.1*  --   --   --  7.7*  --  8.1*  ?AST  --  30  --   --   --  24  --  23  ?ALT  --  18  --   --   --  14  --  11  ?ALKPHOS  --  130*  --   --   --  87  --  79  ?BILITOT  --  1.5*  --   --   --  1.0  --  0.5  ?ALBUMIN  --  2.2*  --   --   --  2.2*  --  2.5*  ?MG  --   --  1.9  --   --  1.9  --  2.1  ?LATICACIDVEN  --   --   --  2.2* 2.1*  --   --   --   ?INR  --   --   1.5*  --   --   --   --   --   ? ?  ? ? ?Radiology Reports ?CT Abdomen Pelvis W Contrast ? ?Result Date: 04/07/2021 ?CLINICAL DATA:  Abdominal wall abscess. Follow-up study of January. Draining fluid

## 2021-04-10 DIAGNOSIS — K7460 Unspecified cirrhosis of liver: Secondary | ICD-10-CM | POA: Diagnosis not present

## 2021-04-10 DIAGNOSIS — K7290 Hepatic failure, unspecified without coma: Secondary | ICD-10-CM | POA: Diagnosis not present

## 2021-04-10 LAB — COMPREHENSIVE METABOLIC PANEL
ALT: 15 U/L (ref 0–44)
AST: 27 U/L (ref 15–41)
Albumin: 2.7 g/dL — ABNORMAL LOW (ref 3.5–5.0)
Alkaline Phosphatase: 98 U/L (ref 38–126)
Anion gap: 6 (ref 5–15)
BUN: 11 mg/dL (ref 8–23)
CO2: 25 mmol/L (ref 22–32)
Calcium: 8.4 mg/dL — ABNORMAL LOW (ref 8.9–10.3)
Chloride: 106 mmol/L (ref 98–111)
Creatinine, Ser: 1.11 mg/dL — ABNORMAL HIGH (ref 0.44–1.00)
GFR, Estimated: 55 mL/min — ABNORMAL LOW (ref >60)
Glucose, Bld: 118 mg/dL — ABNORMAL HIGH (ref 70–99)
Potassium: 4.2 mmol/L (ref 3.5–5.1)
Sodium: 137 mmol/L (ref 135–145)
Total Bilirubin: 0.4 mg/dL (ref 0.3–1.2)
Total Protein: 5.7 g/dL — ABNORMAL LOW (ref 6.5–8.1)

## 2021-04-10 LAB — CBC WITH DIFFERENTIAL/PLATELET
Abs Immature Granulocytes: 0.04 10*3/uL (ref 0.00–0.07)
Basophils Absolute: 0 10*3/uL (ref 0.0–0.1)
Basophils Relative: 0 %
Eosinophils Absolute: 0.1 10*3/uL (ref 0.0–0.5)
Eosinophils Relative: 1 %
HCT: 33.3 % — ABNORMAL LOW (ref 36.0–46.0)
Hemoglobin: 11.1 g/dL — ABNORMAL LOW (ref 12.0–15.0)
Immature Granulocytes: 1 %
Lymphocytes Relative: 11 %
Lymphs Abs: 0.7 10*3/uL (ref 0.7–4.0)
MCH: 33.5 pg (ref 26.0–34.0)
MCHC: 33.3 g/dL (ref 30.0–36.0)
MCV: 100.6 fL — ABNORMAL HIGH (ref 80.0–100.0)
Monocytes Absolute: 0.5 10*3/uL (ref 0.1–1.0)
Monocytes Relative: 7 %
Neutro Abs: 5.4 10*3/uL (ref 1.7–7.7)
Neutrophils Relative %: 80 %
Platelets: 109 10*3/uL — ABNORMAL LOW (ref 150–400)
RBC: 3.31 MIL/uL — ABNORMAL LOW (ref 3.87–5.11)
RDW: 14.6 % (ref 11.5–15.5)
WBC: 6.7 10*3/uL (ref 4.0–10.5)
nRBC: 0 % (ref 0.0–0.2)

## 2021-04-10 LAB — MAGNESIUM: Magnesium: 2.1 mg/dL (ref 1.7–2.4)

## 2021-04-10 MED ORDER — LACTATED RINGERS IV SOLN: INTRAVENOUS | Status: AC

## 2021-04-10 MED ORDER — FUROSEMIDE 40 MG PO TABS: 40.0000 mg | ORAL_TABLET | Freq: Every day | ORAL | Status: DC

## 2021-04-10 MED ORDER — HYDROCORTISONE SOD SUC (PF) 100 MG IJ SOLR
100.0000 mg | Freq: Three times a day (TID) | INTRAMUSCULAR | Status: DC
  Administered 2021-04-10 – 2021-04-12 (×7): 100 mg via INTRAVENOUS
  Filled 2021-04-10 (×7): qty 2

## 2021-04-10 MED ORDER — SPIRONOLACTONE 25 MG PO TABS: 100.0000 mg | ORAL_TABLET | Freq: Every day | ORAL | Status: DC

## 2021-04-10 NOTE — Plan of Care (Signed)

## 2021-04-10 NOTE — Progress Notes (Signed)
?                                  PROGRESS NOTE                                             ?                                                                                                                     ?                                         ? ? Patient Demographics:  ? ? Carla Leonard, is a 66 y.o. female, DOB - [birth year], DQQ:[id number] ? ?Outpatient Primary MD for the patient is Pcp, No    LOS - 3  Admit date - 04/07/2021   ? ?Chief Complaint  ?Patient presents with  ? drainage from umbilical area  ?    ? ?Brief Narrative (HPI from H&P)   66 y.o. female with history of seizures on Keppra, hypertension used to take Aldactone, chronic kidney disease stage III recently admitted twice in February 2023, once for abdominal wall cellulitis second one for closed fracture of the femur managed nonoperatively presents to the ER after patient noticed fluid oozing around her umbilicus, hypotension and severe weakness. ? ? Subjective:  ? ?Patient in bed, appears comfortable, denies any headache, no fever, no chest pain or pressure, no shortness of breath , no abdominal pain. No new focal weakness. ? ? Assessment  & Plan :  ? ?Ascites fluid leaking around umbilical hernia causing dehydration, hypokalemia in a patient with alcoholic liver cirrhosis. she has been admitted to the hospital, admitting physician had discussed her care with general surgeon Dr. Donne Hazel who recommended to get GI input for ascites management.  GI on board.  Her fluid output has gone down, surprisingly on abdominal ultrasound there is no sciatic fluid collection, seen by general surgery and GI both.  Case discussed with general surgery likely she had ascites which has self drained and now ultrasound does not show any fluid collection and fluid oozing out from the umbilicus site has gone down as well.  Blood pressure is too low on 04/10/2021 hence diuretics are held. ? ?2.  Severe hypokalemia.  Due to fluid loss as  a #1 above along with Lasix use.  Replaced and now stable we will continue to monitor. ? ?3.  Morbid obesity with BMI of 44.  Follow with PCP for weight loss. ? ?4.  History of adrenal insufficiency diagnosed 2 admissions ago.  Somehow she was not taking her prescribed steroids, random cortisol 1.4 despite severe hypertension, on stress dose steroids, IV fluid on 04/10/2021  to augment her blood pressure and hold diuretics. ? ?5.  Macrocytic anemia.  Outpatient follow-up with PCP.  Recent B12 and folate levels were stable ? ?6.  Seizures.  On Keppra. ? ?7.  Recent history of distal left femur fracture.  Being managed conservatively.  Weightbearing as tolerated.  PT OT.  May require placement. ? ?  ? ?   ? ?Condition - Extremely Guarded ? ?Family Communication  :  None present ? ?Code Status :  Full ? ?Consults  :  GI, CCS ? ?PUD Prophylaxis :  ? ? Procedures  :    ? ?TTE -  1. Left ventricular ejection fraction, by estimation, is 65 to 70%. Left ventricular ejection fraction by PLAX is 66 %. The left ventricle has normal function. The left ventricle has no regional wall motion abnormalities. Left ventricular diastolic parameters were normal.  2. Right ventricular systolic function is normal. The right ventricular size is normal.  3. The mitral valve is abnormal. Trivial mitral valve regurgitation.  4. The aortic valve is tricuspid. Aortic valve regurgitation is not visualized. Aortic valve sclerosis is present, with no evidence of aortic valve stenosis. Aortic valve mean gradient measures 10.0 mmHg.  5. Increased flow velocities are likely secondary to hyperdynamic or high flow state such as cirrhosis/liver failure ? ?Leg [place] - No DVT ? ?Abd [place] - No Ascites ? ?CT - Cirrhosis of the liver. Previous cholecystectomy. Chronic intra and extrahepatic ductal dilatation without obstructing lesion. Marked increase in the amount of ascites which is freely distributed. Ascites extends through an umbilical region hernia to the skin  surface in this is probably the source of fluid leakage. Lumbar region degenerative changes. Inferior endplate compression fracture at L2, progressive since the study of 01/08/2021. Loss of height of only 10-20%. ? ? ?   ? ?Disposition Plan  :   ? ?Status is: Inpatient ? ?DVT Prophylaxis  :   ? ?heparin injection 5,000 Units Start: 04/08/21 1400 ?SCDs Start: 04/08/21 0010 ? ? ?Lab Results  ?Component Value Date  ? PLT 109 (L) 04/10/2021  ? ? ?Diet :  ?Diet Order   ? ?       ?  Diet 2 gram sodium Room service appropriate? Yes; Fluid consistency: Thin; Fluid restriction: 1200 mL Fluid  Diet effective now       ?  ? ?  ?  ? ?  ?  ? ?Inpatient Medications ? ?Scheduled Meds: ? (feeding supplement) PROSource Plus  30 mL Oral BID BM  ? ALPRAZolam  0.5 mg Oral BID  ? [START ON 04/12/2021] furosemide  40 mg Oral Daily  ? heparin injection (subcutaneous)  5,000 Units Subcutaneous Q8H  ? hydrocortisone sod succinate (SOLU-CORTEF) inj  100 mg Intravenous Q8H  ? levETIRAcetam  500 mg Oral BID  ? midodrine  5 mg Oral TID WC  ? mometasone-formoterol  2 puff Inhalation BID  ? primidone  100 mg Oral q morning  ? [START ON 04/12/2021] spironolactone  100 mg Oral Daily  ? traZODone  100 mg Oral QHS  ? ?Continuous Infusions: ? cefTRIAXone (ROCEPHIN)  IV 2 g (04/09/21 2354)  ? lactated ringers    ? ?PRN Meds:. ? ?Antibiotics  :   ? ?Anti-infectives (From admission, onward)  ? ? Start     Dose/Rate Route Frequency Ordered Stop  ? 04/08/21 0030  cefTRIAXone (ROCEPHIN) 2 g in sodium chloride 0.9 % 100 mL IVPB       ? 2 g ?200  mL/hr over 30 Minutes Intravenous Daily at bedtime 04/08/21 0011    ? ?  ? ? ? Time Spent in minutes  30 ? ? ?Lala Lund M.D on 04/10/2021 at 10:54 AM ? ?To page go to [email or web address]  ? ?[facility] -  Office  [phone] ? ?See all Orders from today for further details ? ? ? Objective:  ? ?Vitals:  ? 04/09/21 2321 04/10/21 0403 04/10/21 5681 04/10/21 0819  ?BP: (!) 93/45 (!) 88/47 (!) 90/50   ?Pulse: 74 70 67    ?Resp: '17 16 16   '$ ?Temp: 98.7 ?F (37.1 ?C) 98.4 ?F (36.9 ?C) 98.1 ?F (36.7 ?C)   ?TempSrc: Oral Oral Oral   ?SpO2: 98% 97% 99% 97%  ?Weight:      ?Height:      ? ? ?Wt Readings from Last 3 Encounters:  ?04/09/21 109.5 kg  ?03/29/21 106.2 kg  ?02/15/21 125.1 kg  ? ? ?No intake or output data in the 24 hours ending 04/10/21 1054 ? ? ? ?Physical Exam ? ?Awake Alert, No new F.N deficits, Normal affect ?[place].AT,PERRAL ?Supple Neck, No JVD,   ?Symmetrical Chest wall movement, Good air movement bilaterally, CTAB ?RRR,No Gallops, Rubs or new Murmurs,  ?+ve B.Sounds, Abd Soft, No tenderness, umbilical dressing appears dry ?No Cyanosis, Clubbing or edema  ? ?  ? ? Data Review:  ? ? ?CBC ?Recent Labs  ?Lab 04/07/21 ?1425 04/08/21 ?0500 04/09/21 ?0236 04/10/21 ?0021  ?WBC 5.3 4.6 6.9 6.7  ?HGB 12.1 10.5* 10.1* 11.1*  ?HCT 36.3 32.0* 30.3* 33.3*  ?PLT 113* 93* 91* 109*  ?MCV 100.6* 100.9* 99.7 100.6*  ?MCH 33.5 33.1 33.2 33.5  ?MCHC 33.3 32.8 33.3 33.3  ?RDW 14.7 14.7 14.6 14.6  ?LYMPHSABS 0.8 0.9 0.7 0.7  ?MONOABS 0.4 0.5 0.5 0.5  ?EOSABS 0.1 0.1 0.0 0.1  ?BASOSABS 0.0 0.0 0.0 0.0  ? ? ?Electrolytes ?Recent Labs  ?Lab 04/06/21 ?1302 04/07/21 ?1425 04/07/21 ?1826 04/07/21 ?1837 04/07/21 ?2041 04/08/21 ?0500 04/08/21 ?1830 04/09/21 ?0236 04/10/21 ?0021  ?NA  --  137  --   --   --  137  --  139 137  ?K  --  2.7*  --   --   --  2.7* 3.9 4.1 4.2  ?CL  --  98  --   --   --  101  --  109 106  ?CO2  --  30  --   --   --  28  --  24 25  ?GLUCOSE  --  116*  --   --   --  96  --  124* 118*  ?BUN  --  6*  --   --   --  7*  --  9 11  ?CREATININE 1.20* 1.16*  --   --   --  1.01*  --  0.93 1.11*  ?CALCIUM  --  8.1*  --   --   --  7.7*  --  8.1* 8.4*  ?AST  --  30  --   --   --  24  --  23 27  ?ALT  --  18  --   --   --  14  --  11 15  ?ALKPHOS  --  130*  --   --   --  87  --  79 98  ?BILITOT  --  1.5*  --   --   --  1.0  --  0.5 0.4  ?ALBUMIN  --  2.2*  --   --   --  2.2*  --  2.5* 2.7*  ?MG  --   --  1.9  --   --  1.9  --  2.1 2.1   ?LATICACIDVEN  --   --   --  2.2* 2.1*  --   --   --   --   ?INR  --   --  1.5*  --   --   --   --   --   --   ? ? ?Radiology Reports ?CT Abdomen Pelvis W Contrast ? ?Result Date: 04/07/2021 ?CLINICAL DATA:  A

## 2021-04-10 NOTE — Progress Notes (Signed)
Subjective: ?No complaints.  No dressing changes over the evening shift. ? ?Objective: ?Vital signs in last 24 hours: ?Temp:  [97.9 ?F (36.6 ?C)-98.7 ?F (37.1 ?C)] 98.4 ?F (36.9 ?C) (04/08 0403) ?Pulse Rate:  [67-75] 70 (04/08 0403) ?Resp:  [12-17] 16 (04/08 0403) ?BP: (86-121)/(45-65) 88/47 (04/08 0403) ?SpO2:  [94 %-99 %] 97 % (04/08 0403) ?  ? ?Intake/Output from previous day: ?No intake/output data recorded. ?Intake/Output this shift: ?No intake/output data recorded. ? ?General appearance: alert and no distress ?GI: soft, non-tender; bowel sounds normal; no masses,  no organomegaly ? ?Lab Results: ?Recent Labs  ?  04/08/21 ?0500 04/09/21 ?0236 04/10/21 ?0021  ?WBC 4.6 6.9 6.7  ?HGB 10.5* 10.1* 11.1*  ?HCT 32.0* 30.3* 33.3*  ?PLT 93* 91* 109*  ? ?BMET ?Recent Labs  ?  04/08/21 ?0500 04/08/21 ?1830 04/09/21 ?0236 04/10/21 ?0021  ?NA 137  --  139 137  ?K 2.7* 3.9 4.1 4.2  ?CL 101  --  109 106  ?CO2 28  --  24 25  ?GLUCOSE 96  --  124* 118*  ?BUN 7*  --  9 11  ?CREATININE 1.01*  --  0.93 1.11*  ?CALCIUM 7.7*  --  8.1* 8.4*  ? ?LFT ?Recent Labs  ?  04/08/21 ?0500 04/09/21 ?0236 04/10/21 ?0021  ?PROT 5.4*   < > 5.7*  ?ALBUMIN 2.2*   < > 2.7*  ?AST 24   < > 27  ?ALT 14   < > 15  ?ALKPHOS 87   < > 98  ?BILITOT 1.0   < > 0.4  ?BILIDIR 0.3*  --   --   ?IBILI 0.7  --   --   ? < > = values in this interval not displayed.  ? ?PT/INR ?Recent Labs  ?  04/07/21 ?1826  ?LABPROT 18.0*  ?INR 1.5*  ? ?Hepatitis Panel ?No results for input(s): HEPBSAG, HCVAB, HEPAIGM, HEPBIGM in the last 72 hours. ?C-Diff ?No results for input(s): CDIFFTOX in the last 72 hours. ?Fecal Lactopherrin ?No results for input(s): FECLLACTOFRN in the last 72 hours. ? ?Studies/Results: ?ECHOCARDIOGRAM COMPLETE ? ?Result Date: 04/08/2021 ?   ECHOCARDIOGRAM REPORT   Patient Name:   CLESSIE KARRAS Date of Exam: 04/08/2021 Medical Rec #:  [id number]        Height:       61.0 in Accession #:    [id number]       Weight:       234.0 lb Date of Birth:  01-31-1955         BSA:          2.019 m? Patient Age:    66 years         BP:           114/54 mmHg Patient Gender: F                HR:           79 bpm. Exam Location:  Inpatient Procedure: 2D Echo Indications:    Dyspnea  History:        Patient has no prior history of Echocardiogram examinations.                 Risk Factors:Hypertension.  Sonographer:    Arlyss Gandy Referring Phys: [place]  Sonographer Comments: Image acquisition challenging due to patient body habitus. IMPRESSIONS  1. Left ventricular ejection fraction, by estimation, is 65 to 70%. Left ventricular ejection fraction by PLAX is 66 %.  The left ventricle has normal function. The left ventricle has no regional wall motion abnormalities. Left ventricular diastolic parameters were normal.  2. Right ventricular systolic function is normal. The right ventricular size is normal.  3. The mitral valve is abnormal. Trivial mitral valve regurgitation.  4. The aortic valve is tricuspid. Aortic valve regurgitation is not visualized. Aortic valve sclerosis is present, with no evidence of aortic valve stenosis. Aortic valve mean gradient measures 10.0 mmHg.  5. Increased flow velocities are likely secondary to hyperdynamic or high flow state such as cirrhosis/liver failure. Comparison(s): No prior Echocardiogram. FINDINGS  Left Ventricle: Left ventricular ejection fraction, by estimation, is 65 to 70%. Left ventricular ejection fraction by PLAX is 66 %. The left ventricle has normal function. The left ventricle has no regional wall motion abnormalities. The left ventricular internal cavity size was normal in size. There is no left ventricular hypertrophy. Left ventricular diastolic parameters were normal. Right Ventricle: The right ventricular size is normal. No increase in right ventricular wall thickness. Right ventricular systolic function is normal. Left Atrium: Left atrial size was normal in size. Right Atrium: Right atrial size was normal in size.  Pericardium: There is no evidence of pericardial effusion. Mitral Valve: The mitral valve is abnormal. There is mild thickening of the mitral valve leaflet(s). Trivial mitral valve regurgitation. Tricuspid Valve: The tricuspid valve is grossly normal. Tricuspid valve regurgitation is trivial. Aortic Valve: The aortic valve is tricuspid. Aortic valve regurgitation is not visualized. Aortic valve sclerosis is present, with no evidence of aortic valve stenosis. Aortic valve mean gradient measures 10.0 mmHg. Aortic valve peak gradient measures 17.5 mmHg. Aortic valve area, by VTI measures 1.57 cm?. Pulmonic Valve: The pulmonic valve was normal in structure. Pulmonic valve regurgitation is not visualized. Aorta: The aortic root and ascending aorta are structurally normal, with no evidence of dilitation. Venous: The inferior vena cava was not well visualized. IAS/Shunts: No atrial level shunt detected by color flow Doppler.  LEFT VENTRICLE PLAX 2D LV EF:         Left            Diastology                ventricular     LV e' medial:    11.40 cm/s                ejection        LV E/e' medial:  10.0                fraction by     LV e' lateral:   15.60 cm/s                PLAX is 66      LV E/e' lateral: 7.3                %. LVIDd:         4.40 cm LVIDs:         2.80 cm LV PW:         1.00 cm LV IVS:        1.00 cm LVOT diam:     2.00 cm LV SV:         78 LV SV Index:   39 LVOT Area:     3.14 cm?  RIGHT VENTRICLE RV Basal diam:  3.00 cm RV Mid diam:    2.50 cm RV S prime:     14.70 cm/s TAPSE (M-mode): 2.6 cm  LEFT ATRIUM             Index        RIGHT ATRIUM           Index LA diam:        3.70 cm 1.83 cm/m?   RA Area:     14.90 cm? LA Vol (A2C):   67.3 ml 33.33 ml/m?  RA Volume:   36.30 ml  17.98 ml/m? LA Vol (A4C):   48.2 ml 23.87 ml/m? LA Biplane Vol: 57.9 ml 28.68 ml/m?  AORTIC VALVE                     PULMONIC VALVE AV Area (Vmax):    1.58 cm?      PV Vmax:       1.28 m/s AV Area (Vmean):   1.60 cm?      PV Peak  grad:  6.6 mmHg AV Area (VTI):     1.57 cm? AV Vmax:           209.00 cm/s AV Vmean:          147.000 cm/s AV VTI:            0.498 m AV Peak Grad:      17.5 mmHg AV Mean Grad:      10.0 mmHg LVOT Vmax:         105.00 cm/s LVOT Vmean:        75.000 cm/s LVOT VTI:          0.249 m LVOT/AV VTI ratio: 0.50  AORTA Ao Root diam: 2.80 cm MITRAL VALVE MV Area (PHT): 3.63 cm?     SHUNTS MV Decel Time: 209 msec     Systemic VTI:  0.25 m MV E velocity: 114.00 cm/s  Systemic Diam: 2.00 cm MV A velocity: 102.00 cm/s MV E/A ratio:  1.12 Lyman Bishop MD Electronically signed by Lyman Bishop MD Signature Date/Time: 04/08/2021/2:03:50 PM    Final   ? ?IR ABDOMEN US LIMITED ? ?Result Date: 04/08/2021 ?CLINICAL DATA:  Ascites EXAM: LIMITED ABDOMEN ULTRASOUND FOR ASCITES TECHNIQUE: Limited ultrasound survey for ascites was performed in all four abdominal quadrants. COMPARISON:  None. FINDINGS: No significant ascites. IMPRESSION: No significant abdominal ascites ultrasound. Electronically Signed   By: Miachel Roux M.D.   On: 04/08/2021 16:14  ? ?VAS [place] LOWER EXTREMITY VENOUS (DVT) ? ?Result Date: 04/08/2021 ? Lower Venous DVT Study Patient Name:  DILIA ALEMANY  Date of Exam:   04/08/2021 Medical Rec #: [id number]         Accession #:    [id number] Date of Birth: Feb 05, 1955         Patient Gender: F Patient Age:   10 years Exam Location:  [facility] Procedure:      VAS [place] LOWER EXTREMITY VENOUS (DVT) Referring Phys: Gean Birch[place] --------------------------------------------------------------------------------  Indications: Edema.  Risk Factors: Ascites, CKD III, Abdominal wall cellulitis, alcoholic liver cirrhosis, macrotic anemia. Comparison Study: Prior negative left LEV done 06/29/11 Performing Technologist: Sharion Dove RVS  Examination Guidelines: A complete evaluation includes B-mode imaging, spectral Doppler, color Doppler, and power Doppler as needed of all accessible portions of each vessel. Bilateral testing is  considered an integral part of a complete examination. Limited examinations for reoccurring indications may be performed as noted. The reflux portion of the exam is performed with the patient in reverse Trendelenb

## 2021-04-11 DIAGNOSIS — K7460 Unspecified cirrhosis of liver: Secondary | ICD-10-CM | POA: Diagnosis not present

## 2021-04-11 DIAGNOSIS — K7290 Hepatic failure, unspecified without coma: Secondary | ICD-10-CM | POA: Diagnosis not present

## 2021-04-11 LAB — CBC WITH DIFFERENTIAL/PLATELET
Abs Immature Granulocytes: 0.04 10*3/uL (ref 0.00–0.07)
Basophils Absolute: 0 10*3/uL (ref 0.0–0.1)
Basophils Relative: 0 %
Eosinophils Absolute: 0 10*3/uL (ref 0.0–0.5)
Eosinophils Relative: 0 %
HCT: 33.9 % — ABNORMAL LOW (ref 36.0–46.0)
Hemoglobin: 11 g/dL — ABNORMAL LOW (ref 12.0–15.0)
Immature Granulocytes: 1 %
Lymphocytes Relative: 8 %
Lymphs Abs: 0.4 10*3/uL — ABNORMAL LOW (ref 0.7–4.0)
MCH: 33 pg (ref 26.0–34.0)
MCHC: 32.4 g/dL (ref 30.0–36.0)
MCV: 101.8 fL — ABNORMAL HIGH (ref 80.0–100.0)
Monocytes Absolute: 0.3 10*3/uL (ref 0.1–1.0)
Monocytes Relative: 6 %
Neutro Abs: 4.8 10*3/uL (ref 1.7–7.7)
Neutrophils Relative %: 85 %
Platelets: 99 10*3/uL — ABNORMAL LOW (ref 150–400)
RBC: 3.33 MIL/uL — ABNORMAL LOW (ref 3.87–5.11)
RDW: 14.8 % (ref 11.5–15.5)
WBC: 5.7 10*3/uL (ref 4.0–10.5)
nRBC: 0 % (ref 0.0–0.2)

## 2021-04-11 LAB — COMPREHENSIVE METABOLIC PANEL
ALT: 16 U/L (ref 0–44)
AST: 31 U/L (ref 15–41)
Albumin: 2.4 g/dL — ABNORMAL LOW (ref 3.5–5.0)
Alkaline Phosphatase: 90 U/L (ref 38–126)
Anion gap: 5 (ref 5–15)
BUN: 12 mg/dL (ref 8–23)
CO2: 25 mmol/L (ref 22–32)
Calcium: 8.4 mg/dL — ABNORMAL LOW (ref 8.9–10.3)
Chloride: 105 mmol/L (ref 98–111)
Creatinine, Ser: 1.14 mg/dL — ABNORMAL HIGH (ref 0.44–1.00)
GFR, Estimated: 53 mL/min — ABNORMAL LOW (ref >60)
Glucose, Bld: 151 mg/dL — ABNORMAL HIGH (ref 70–99)
Potassium: 4.5 mmol/L (ref 3.5–5.1)
Sodium: 135 mmol/L (ref 135–145)
Total Bilirubin: 0.7 mg/dL (ref 0.3–1.2)
Total Protein: 5.6 g/dL — ABNORMAL LOW (ref 6.5–8.1)

## 2021-04-11 LAB — MAGNESIUM: Magnesium: 1.9 mg/dL (ref 1.7–2.4)

## 2021-04-11 MED ORDER — MIDODRINE HCL 5 MG PO TABS
10.0000 mg | ORAL_TABLET | Freq: Three times a day (TID) | ORAL | Status: DC
  Administered 2021-04-11 – 2021-04-12 (×4): 10 mg via ORAL
  Filled 2021-04-11 (×3): qty 2

## 2021-04-11 NOTE — Progress Notes (Signed)
?                                  PROGRESS NOTE                                             ?                                                                                                                     ?                                         ? ? Patient Demographics:  ? ? Carla Leonard, is a 66 y.o. female, DOB - 08-16-1955, GYI:[id number] ? ?Outpatient Primary MD for the patient is Pcp, No    LOS - 4  Admit date - 04/07/2021   ? ?Chief Complaint  ?Patient presents with  ? drainage from umbilical area  ?    ? ?Brief Narrative (HPI from H&P)   66 y.o. female with history of seizures on Keppra, hypertension used to take Aldactone, chronic kidney disease stage III recently admitted twice in February 2023, once for abdominal wall cellulitis second one for closed fracture of the femur managed nonoperatively presents to the ER after patient noticed fluid oozing around her umbilicus, hypotension and severe weakness. ? ? Subjective:  ? ?Patient in bed, appears comfortable, denies any headache, no fever, no chest pain or pressure, no shortness of breath , no abdominal pain. No focal weakness. ? ? Assessment  & Plan :  ? ?Ascites fluid leaking around umbilical hernia causing dehydration, hypokalemia in a patient with alcoholic liver cirrhosis. she has been admitted to the hospital, admitting physician had discussed her care with general surgeon Dr. Donne Hazel who recommended to get GI input for ascites management.  GI on board.  Her fluid output has gone down, surprisingly on abdominal ultrasound there is no sciatic fluid collection, seen by general surgery and GI both.  Case discussed with general surgery likely she had ascites which has self drained and now ultrasound does not show any fluid collection and fluid oozing out from the umbilicus site has gone down as well.  Blood pressure is too low on 04/10/2021 hence diuretics are held. ? ?2.  Severe hypokalemia.  Due to fluid loss as a  #1 above along with Lasix use.  Replaced and now stable we will continue to monitor. ? ?3.  Morbid obesity with BMI of 44.  Follow with PCP for weight loss. ? ?4.  History of adrenal insufficiency diagnosed 2 admissions ago.  Somehow she was not taking her prescribed steroids, random cortisol 1.4 despite severe hypertension, on stress dose steroids, been adequately hydrated holding diuretics  as blood pressure is still low.. ? ?5.  Macrocytic anemia.  Outpatient follow-up with PCP.  Recent B12 and folate levels were stable ? ?6.  Seizures.  On Keppra. ? ?7.  Recent history of distal left femur fracture.  Being managed conservatively.  Weightbearing as tolerated.  PT OT.  May require placement. ? ?  ? ?   ? ?Condition - Extremely Guarded ? ?Family Communication  :  None present ? ?Code Status :  Full ? ?Consults  :  GI, CCS ? ?PUD Prophylaxis :  ? ? Procedures  :    ? ?TTE -  1. Left ventricular ejection fraction, by estimation, is 65 to 70%. Left ventricular ejection fraction by PLAX is 66 %. The left ventricle has normal function. The left ventricle has no regional wall motion abnormalities. Left ventricular diastolic parameters were normal.  2. Right ventricular systolic function is normal. The right ventricular size is normal.  3. The mitral valve is abnormal. Trivial mitral valve regurgitation.  4. The aortic valve is tricuspid. Aortic valve regurgitation is not visualized. Aortic valve sclerosis is present, with no evidence of aortic valve stenosis. Aortic valve mean gradient measures 10.0 mmHg.  5. Increased flow velocities are likely secondary to hyperdynamic or high flow state such as cirrhosis/liver failure ? ?Leg [place] - No DVT ? ?Abd [place] - No Ascites ? ?CT - Cirrhosis of the liver. Previous cholecystectomy. Chronic intra and extrahepatic ductal dilatation without obstructing lesion. Marked increase in the amount of ascites which is freely distributed. Ascites extends through an umbilical region hernia to the  skin surface in this is probably the source of fluid leakage. Lumbar region degenerative changes. Inferior endplate compression fracture at L2, progressive since the study of 01/08/2021. Loss of height of only 10-20%. ? ? ?   ? ?Disposition Plan  :   ? ?Status is: Inpatient ? ?DVT Prophylaxis  :   ? ?Place TED hose Start: 04/10/21 1204 ?heparin injection 5,000 Units Start: 04/08/21 1400 ?SCDs Start: 04/08/21 0010 ? ? ?Lab Results  ?Component Value Date  ? PLT 99 (L) 04/11/2021  ? ? ?Diet :  ?Diet Order   ? ?       ?  Diet 2 gram sodium Room service appropriate? Yes; Fluid consistency: Thin; Fluid restriction: 1200 mL Fluid  Diet effective now       ?  ? ?  ?  ? ?  ?  ? ?Inpatient Medications ? ?Scheduled Meds: ? (feeding supplement) PROSource Plus  30 mL Oral BID BM  ? ALPRAZolam  0.5 mg Oral BID  ? heparin injection (subcutaneous)  5,000 Units Subcutaneous Q8H  ? hydrocortisone sod succinate (SOLU-CORTEF) inj  100 mg Intravenous Q8H  ? levETIRAcetam  500 mg Oral BID  ? midodrine  10 mg Oral TID WC  ? mometasone-formoterol  2 puff Inhalation BID  ? primidone  100 mg Oral q morning  ? traZODone  100 mg Oral QHS  ? ?Continuous Infusions: ? cefTRIAXone (ROCEPHIN)  IV Stopped (04/10/21 2202)  ? ?PRN Meds:. ? ?Antibiotics  :   ? ?Anti-infectives (From admission, onward)  ? ? Start     Dose/Rate Route Frequency Ordered Stop  ? 04/08/21 0030  cefTRIAXone (ROCEPHIN) 2 g in sodium chloride 0.9 % 100 mL IVPB       ? 2 g ?200 mL/hr over 30 Minutes Intravenous Daily at bedtime 04/08/21 0011    ? ?  ? ? ? Time Spent in minutes  30 ? ? ?  Lala Lund M.D on 04/11/2021 at 10:09 AM ? ?To page go to [email or web address]  ? ?[facility] -  Office  [phone] ? ?See all Orders from today for further details ? ? ? Objective:  ? ?Vitals:  ? 04/11/21 0030 04/11/21 0409 04/11/21 0500 04/11/21 0739  ?BP: (!) 94/48 (!) 96/55  (!) 96/48  ?Pulse: 70 63  66  ?Resp: '18 18  16  '$ ?Temp: 97.6 ?F (36.4 ?C) (!) 97.3 ?F (36.3 ?C)  98 ?F (36.7 ?C)   ?TempSrc: Oral Oral  Oral  ?SpO2: 96% 96%  96%  ?Weight:   109.8 kg   ?Height:      ? ? ?Wt Readings from Last 3 Encounters:  ?04/11/21 109.8 kg  ?03/29/21 106.2 kg  ?02/15/21 125.1 kg  ? ? ? ?Intake/Output Summary (Last 24 hours) at 04/11/2021 1009 ?Last data filed at 04/11/2021 0600 ?Gross per 24 hour  ?Intake 500 ml  ?Output --  ?Net 500 ml  ? ? ? ? ?Physical Exam ? ?Awake Alert, No new F.N deficits, Normal affect ?[place].AT,PERRAL ?Supple Neck, No JVD,   ?Symmetrical Chest wall movement, Good air movement bilaterally, CTAB ?RRR,No Gallops, Rubs or new Murmurs,  ?+ve B.Sounds, Abd Soft, No tenderness, abdominal dressing is dry. ?No Cyanosis, Clubbing or edema  ? ? Data Review:  ? ? ?CBC ?Recent Labs  ?Lab 04/07/21 ?1425 04/08/21 ?0500 04/09/21 ?0236 04/10/21 ?0021 04/11/21 ?0110  ?WBC 5.3 4.6 6.9 6.7 5.7  ?HGB 12.1 10.5* 10.1* 11.1* 11.0*  ?HCT 36.3 32.0* 30.3* 33.3* 33.9*  ?PLT 113* 93* 91* 109* 99*  ?MCV 100.6* 100.9* 99.7 100.6* 101.8*  ?MCH 33.5 33.1 33.2 33.5 33.0  ?MCHC 33.3 32.8 33.3 33.3 32.4  ?RDW 14.7 14.7 14.6 14.6 14.8  ?LYMPHSABS 0.8 0.9 0.7 0.7 0.4*  ?MONOABS 0.4 0.5 0.5 0.5 0.3  ?EOSABS 0.1 0.1 0.0 0.1 0.0  ?BASOSABS 0.0 0.0 0.0 0.0 0.0  ? ? ?Electrolytes ?Recent Labs  ?Lab 04/07/21 ?1425 04/07/21 ?1826 04/07/21 ?1837 04/07/21 ?2041 04/08/21 ?0500 04/08/21 ?1830 04/09/21 ?0236 04/10/21 ?0021 04/11/21 ?0110  ?NA 137  --   --   --  137  --  139 137 135  ?K 2.7*  --   --   --  2.7* 3.9 4.1 4.2 4.5  ?CL 98  --   --   --  101  --  109 106 105  ?CO2 30  --   --   --  28  --  '24 25 25  '$ ?GLUCOSE 116*  --   --   --  96  --  124* 118* 151*  ?BUN 6*  --   --   --  7*  --  '9 11 12  '$ ?CREATININE 1.16*  --   --   --  1.01*  --  0.93 1.11* 1.14*  ?CALCIUM 8.1*  --   --   --  7.7*  --  8.1* 8.4* 8.4*  ?AST 30  --   --   --  24  --  '23 27 31  '$ ?ALT 18  --   --   --  14  --  '11 15 16  '$ ?ALKPHOS 130*  --   --   --  87  --  79 98 90  ?BILITOT 1.5*  --   --   --  1.0  --  0.5 0.4 0.7  ?ALBUMIN 2.2*  --   --   --  2.2*  --  2.5*  2.7* 2.4*  ?  MG  --  1.9  --   --  1.9  --  2.1 2.1 1.9  ?LATICACIDVEN  --   --  2.2* 2.1*  --   --   --   --   --   ?INR  --  1.5*  --   --   --   --   --   --   --   ? ? ?Radiology Reports ?DG [facility]

## 2021-04-11 NOTE — Progress Notes (Signed)
Subjective: ?Ascitic fluid drainage decreased, but she continues to drain. ? ?Objective: ?Vital signs in last 24 hours: ?Temp:  [97.3 ?F (36.3 ?C)-98 ?F (36.7 ?C)] 97.7 ?F (36.5 ?C) (04/09 1129) ?Pulse Rate:  [63-70] 64 (04/09 1129) ?Resp:  [16-18] 16 (04/09 1129) ?BP: (93-99)/(48-58) 99/55 (04/09 1129) ?SpO2:  [96 %-100 %] 100 % (04/09 1129) ?Weight:  [109.8 kg] 109.8 kg (04/09 0500) ?  ? ?Intake/Output from previous day: ?04/08 0701 - 04/09 0700 ?In: 500 [P.O.:200; IV Piggyback:300] ?Out: -  ?Intake/Output this shift: ?No intake/output data recorded. ? ?General appearance: alert and no distress ?GI: soft, non-tender; bowel sounds normal; no masses,  no organomegaly ? ?Lab Results: ?Recent Labs  ?  04/09/21 ?0236 04/10/21 ?0021 04/11/21 ?0110  ?WBC 6.9 6.7 5.7  ?HGB 10.1* 11.1* 11.0*  ?HCT 30.3* 33.3* 33.9*  ?PLT 91* 109* 99*  ? ?BMET ?Recent Labs  ?  04/09/21 ?0236 04/10/21 ?0021 04/11/21 ?0110  ?NA 139 137 135  ?K 4.1 4.2 4.5  ?CL 109 106 105  ?CO2 '24 25 25  '$ ?GLUCOSE 124* 118* 151*  ?BUN '9 11 12  '$ ?CREATININE 0.93 1.11* 1.14*  ?CALCIUM 8.1* 8.4* 8.4*  ? ?LFT ?Recent Labs  ?  04/11/21 ?0110  ?PROT 5.6*  ?ALBUMIN 2.4*  ?AST 31  ?ALT 16  ?ALKPHOS 90  ?BILITOT 0.7  ? ?PT/INR ?No results for input(s): LABPROT, INR in the last 72 hours. ?Hepatitis Panel ?No results for input(s): HEPBSAG, HCVAB, HEPAIGM, HEPBIGM in the last 72 hours. ?C-Diff ?No results for input(s): CDIFFTOX in the last 72 hours. ?Fecal Lactopherrin ?No results for input(s): FECLLACTOFRN in the last 72 hours. ? ?Studies/Results: ?No results found. ? ?Medications: Scheduled: ? (feeding supplement) PROSource Plus  30 mL Oral BID BM  ? ALPRAZolam  0.5 mg Oral BID  ? heparin injection (subcutaneous)  5,000 Units Subcutaneous Q8H  ? hydrocortisone sod succinate (SOLU-CORTEF) inj  100 mg Intravenous Q8H  ? levETIRAcetam  500 mg Oral BID  ? midodrine  10 mg Oral TID WC  ? mometasone-formoterol  2 puff Inhalation BID  ? primidone  100 mg Oral q morning  ?  traZODone  100 mg Oral QHS  ? ?Continuous: ? cefTRIAXone (ROCEPHIN)  IV Stopped (04/10/21 2202)  ? ? ?Assessment/Plan: ?1) Auto paracentesis from her umbilical hernia.  She is not a surgical candidate. ?2) Mild renal insufficiency. ?3) ETOH cirrhosis - Abstinent from ETOH. ?4) Hypotension. ?  ? Management is difficult as she will continue to have some drainage from her umbilical hernia.  At this time she appears intolerant to diuretics as she is hypotensive.  This may be a situation where she may benefit with a TIPS as persistent drainage will result in dehydration and electrolyte abnormalities.  Her MELD during this admission is at 14. ? ?Plan: ?1) Continue to monitor drainage. ?2) Monitor her creatinine. ?3) 2 gram sodium diet. ?4) Consult IR, nonemergent, for consideration of a TIPS. ? ? ? LOS: 4 days  ? ?Tek[place] Amon D ?04/11/2021, 2:01 PM  ?

## 2021-04-12 ENCOUNTER — Other Ambulatory Visit (HOSPITAL_COMMUNITY): Payer: Self-pay

## 2021-04-12 DIAGNOSIS — K7460 Unspecified cirrhosis of liver: Secondary | ICD-10-CM | POA: Diagnosis not present

## 2021-04-12 DIAGNOSIS — K7290 Hepatic failure, unspecified without coma: Secondary | ICD-10-CM | POA: Diagnosis not present

## 2021-04-12 LAB — CBC WITH DIFFERENTIAL/PLATELET
Abs Immature Granulocytes: 0.05 10*3/uL (ref 0.00–0.07)
Basophils Absolute: 0 10*3/uL (ref 0.0–0.1)
Basophils Relative: 0 %
Eosinophils Absolute: 0 10*3/uL (ref 0.0–0.5)
Eosinophils Relative: 0 %
HCT: 33.9 % — ABNORMAL LOW (ref 36.0–46.0)
Hemoglobin: 11.2 g/dL — ABNORMAL LOW (ref 12.0–15.0)
Immature Granulocytes: 1 %
Lymphocytes Relative: 8 %
Lymphs Abs: 0.5 10*3/uL — ABNORMAL LOW (ref 0.7–4.0)
MCH: 33.2 pg (ref 26.0–34.0)
MCHC: 33 g/dL (ref 30.0–36.0)
MCV: 100.6 fL — ABNORMAL HIGH (ref 80.0–100.0)
Monocytes Absolute: 0.5 10*3/uL (ref 0.1–1.0)
Monocytes Relative: 9 %
Neutro Abs: 5 10*3/uL (ref 1.7–7.7)
Neutrophils Relative %: 82 %
Platelets: 100 10*3/uL — ABNORMAL LOW (ref 150–400)
RBC: 3.37 MIL/uL — ABNORMAL LOW (ref 3.87–5.11)
RDW: 14.6 % (ref 11.5–15.5)
WBC: 6 10*3/uL (ref 4.0–10.5)
nRBC: 0 % (ref 0.0–0.2)

## 2021-04-12 LAB — COMPREHENSIVE METABOLIC PANEL
ALT: 24 U/L (ref 0–44)
AST: 42 U/L — ABNORMAL HIGH (ref 15–41)
Albumin: 2.7 g/dL — ABNORMAL LOW (ref 3.5–5.0)
Alkaline Phosphatase: 102 U/L (ref 38–126)
Anion gap: 8 (ref 5–15)
BUN: 18 mg/dL (ref 8–23)
CO2: 22 mmol/L (ref 22–32)
Calcium: 8.8 mg/dL — ABNORMAL LOW (ref 8.9–10.3)
Chloride: 104 mmol/L (ref 98–111)
Creatinine, Ser: 1.36 mg/dL — ABNORMAL HIGH (ref 0.44–1.00)
GFR, Estimated: 43 mL/min — ABNORMAL LOW (ref >60)
Glucose, Bld: 137 mg/dL — ABNORMAL HIGH (ref 70–99)
Potassium: 4.5 mmol/L (ref 3.5–5.1)
Sodium: 134 mmol/L — ABNORMAL LOW (ref 135–145)
Total Bilirubin: 0.4 mg/dL (ref 0.3–1.2)
Total Protein: 5.8 g/dL — ABNORMAL LOW (ref 6.5–8.1)

## 2021-04-12 LAB — CULTURE, BLOOD (ROUTINE X 2)
Culture: NO GROWTH
Culture: NO GROWTH
Special Requests: ADEQUATE
Special Requests: ADEQUATE

## 2021-04-12 LAB — MAGNESIUM: Magnesium: 2.2 mg/dL (ref 1.7–2.4)

## 2021-04-12 MED ORDER — LACTATED RINGERS IV BOLUS
1000.0000 mL | Freq: Once | INTRAVENOUS | Status: AC
  Administered 2021-04-12: 1000 mL via INTRAVENOUS

## 2021-04-12 MED ORDER — MIDODRINE HCL 10 MG PO TABS
10.0000 mg | ORAL_TABLET | Freq: Three times a day (TID) | ORAL | 0 refills | Status: DC
Start: 2021-04-12 — End: 2023-08-22
  Filled 2021-04-12: qty 90, 30d supply, fill #0

## 2021-04-12 MED ORDER — LACTATED RINGERS IV BOLUS: 500.0000 mL | Freq: Once | INTRAVENOUS | Status: DC

## 2021-04-12 MED ORDER — CEFUROXIME AXETIL 250 MG PO TABS
250.0000 mg | ORAL_TABLET | Freq: Two times a day (BID) | ORAL | 0 refills | Status: DC
Start: 2021-04-12 — End: 2021-05-01
  Filled 2021-04-12: qty 20, 10d supply, fill #0

## 2021-04-12 MED ORDER — FUROSEMIDE 20 MG PO TABS
20.0000 mg | ORAL_TABLET | Freq: Every day | ORAL | 0 refills | Status: DC
  Filled 2021-04-12: qty 30, 30d supply, fill #0

## 2021-04-12 MED ORDER — HYDROCORTISONE 10 MG PO TABS
20.0000 mg | ORAL_TABLET | Freq: Every day | ORAL | 1 refills | Status: DC
  Filled 2021-04-12: qty 60, 30d supply, fill #0

## 2021-04-12 MED ORDER — LEVETIRACETAM 500 MG PO TABS
500.0000 mg | ORAL_TABLET | Freq: Two times a day (BID) | ORAL | 0 refills | Status: DC
  Filled 2021-04-12: qty 60, 30d supply, fill #0

## 2021-04-12 NOTE — Discharge Summary (Signed)
?                                                                                ? ?Carla Leonard:[id number] DOB: 29-Apr-1955 DOA: 04/07/2021 ? ?PCP: Bonnita Nasuti, MD ? ?Admit date: 04/07/2021  Discharge date: 04/12/2021 ? ?Admitted From: Home   Disposition:  Home ? ? ?Recommendations for Outpatient Follow-up:  ? ?Follow up with PCP in 1-2 weeks ? ?PCP Please obtain BMP/CBC, 2 view CXR in 1week,  (see Discharge instructions)  ? ?PCP Please follow up on the following pending results: Needs endocrinology follow-up within a week along with general surgery follow-up with Dr. Michaelle Birks within 1 to 2 weeks.  Monitor electrolytes closely. ? ? ?Home Health: None ?Equipment/Devices: as below  ?Consultations: CCS, GI ?Discharge Condition: Stable    ?CODE STATUS: Full    ?Diet Recommendation: Heart Healthy  ?  ? ?Chief Complaint  ?Patient presents with  ? drainage from umbilical area  ?  ? ?Brief history of present illness from the day of admission and additional interim summary   ? ?66 y.o. female with history of seizures on Keppra, hypertension used to take Aldactone, chronic kidney disease stage III recently admitted twice in February 2023, once for abdominal wall cellulitis second one for closed fracture of the femur managed nonoperatively presents to the ER after patient noticed fluid oozing around her umbilicus, hypotension and severe weakness. ? ?                                                               Hospital Course  ? ? Ascites fluid leaking around umbilical hernia causing dehydration, hypokalemia in a patient with alcoholic liver cirrhosis. she has been admitted to the hospital, she was seen by GI and general surgery.  Her fluid output has gone down, surprisingly on abdominal ultrasound there is no sciatic fluid collection, seen by general surgery and GI both.  Case discussed with general surgery likely she had  ascites which has self drained and now ultrasound does not show any fluid collection and fluid oozing out from the umbilicus site has gone down as well.  Low-dose diuretics have been resumed now that blood pressure has stabilized somewhat, she will be given SBP prophylaxis upon discharge for another 10 days with close outpatient GI and general surgery follow-up.  She will require referral for liver transplant by primary gastroenterologist. ?  ?2.  Severe hypokalemia.  Due to fluid loss as a #1 above along with Lasix use.  Replaced and now stable CP to monitor in the outpatient setting. ?  ?3.  Morbid obesity with BMI of 44.  Follow with PCP for weight loss. ?  ?4.  History of adrenal insufficiency diagnosed 2 admissions ago.  Somehow she was not taking her prescribed steroids, and also never followed with her endocrinologist, she had low blood pressures for which she was started on stress dose steroids and adequately hydrated, blood pressures have stabilized she  will be placed on oral steroid treatment with close outpatient endocrine follow-up, I have requested her to follow-up with endocrinologist within a week and not to stop or change her steroid dose still it has been addressed by the endocrinologist. ?  ?5.  Macrocytic anemia.  Outpatient follow-up with PCP.  Recent B12 and folate levels were stable ?  ?6.  Seizures.  On Keppra. ?  ?7.  Recent history of distal left femur fracture.  Being managed conservatively.  Weightbearing as tolerated.  PT OT.  Cleared by PT OT for home discharge. ? ? ?Discharge diagnosis   ? ? ?Principal Problem: ?  Decompensated hepatic cirrhosis ([place]) ?Active Problems: ?  Essential hypertension ?  CKD (chronic kidney disease), stage III ([place]) ?  Thrombocytopenia ([place]) ?  Seizure disorder ([place]) ? ? ? ?Discharge instructions   ? ?Discharge Instructions   ? ? Discharge instructions   Complete by: As directed ?  ? Follow with Primary MD Bonnita Nasuti, MD in 7 days  ? ?Get CBC, CMP, 2 view  Chest X ray -  checked next visit within 1 week by Primary MD  ? ?Activity: As tolerated with Full fall precautions use walker/cane & assistance as needed ? ?Disposition Home   ? ?Diet: Heart Healthy   ? ?Special Instructions: If you have smoked or chewed Tobacco  in the last 2 yrs please stop smoking, stop any regular Alcohol  and or any Recreational drug use. ? ?On your next visit with your primary care physician please Get Medicines reviewed and adjusted. ? ?Please request your Prim.MD to go over all Hospital Tests and Procedure/Radiological results at the follow up, please get all Hospital records sent to your Prim MD by signing hospital release before you go home. ? ?If you experience worsening of your admission symptoms, develop shortness of breath, life threatening emergency, suicidal or homicidal thoughts you must seek medical attention immediately by calling 911 or calling your MD immediately  if symptoms less severe. ? ?You Must read complete instructions/literature along with all the possible adverse reactions/side effects for all the Medicines you take and that have been prescribed to you. Take any new Medicines after you have completely understood and accpet all the possible adverse reactions/side effects.  ? Increase activity slowly   Complete by: As directed ?  ? ?  ? ? ?Discharge Medications  ? ?Allergies as of 04/12/2021   ? ?   Reactions  ? Sulfa Antibiotics Anaphylaxis  ? Prednisone Other (See Comments)  ? hallucinations  ? Nsaids Other (See Comments)  ? Stomach pain, diarrhea  ? Other Other (See Comments)  ? Steroids: hallucinations  ? ?  ? ?  ?Medication List  ?  ? ?TAKE these medications   ? ?ALPRAZolam 0.5 MG tablet ?Commonly known as: Duanne Moron ?Take 0.5 mg by mouth at bedtime as needed for sleep. ?  ?budesonide-formoterol 160-4.5 MCG/ACT inhaler ?Commonly known as: SYMBICORT ?Inhale 2 puffs into the lungs 2 (two) times daily as needed (shortness of breath/wheezing). ?  ?cefUROXime 250 MG  tablet ?Commonly known as: CEFTIN ?Take 1 tablet (250 mg total) by mouth 2 (two) times daily with a meal. ?  ?feeding supplement Liqd ?Take 1 Container by mouth 2 (two) times daily as needed (appetite). ?  ?furosemide 20 MG tablet ?Commonly known as: LASIX ?Take 1 tablet (20 mg total) by mouth daily. ?What changed:  ?when to take this ?reasons to take this ?  ?hydrocortisone 20 MG tablet ?Commonly known as: CORTEF ?  Take 1 tablet (20 mg total) by mouth daily. you must follow-up with an endocrinologist within 1 to 2 weeks and get the dose adjusted, do not stop it abruptly unless told by the endocrinologist to do so. ?What changed:  ?medication strength ?how much to take ?how to take this ?when to take this ?additional instructions ?  ?levETIRAcetam 500 MG tablet ?Commonly known as: KEPPRA ?Take 1 tablet (500 mg total) by mouth 2 (two) times daily. ?  ?midodrine 10 MG tablet ?Commonly known as: PROAMATINE ?Take 1 tablet (10 mg total) by mouth 3 (three) times daily with meals. ?  ?primidone 50 MG tablet ?Commonly known as: MYSOLINE ?Take 100 mg by mouth every morning. ?  ?promethazine 12.5 MG tablet ?Commonly known as: PHENERGAN ?Take 12.5 mg by mouth every 4 (four) hours as needed for nausea or vomiting. ?  ?traMADol 50 MG tablet ?Commonly known as: ULTRAM ?Take 50 mg by mouth 2 (two) times daily as needed for moderate pain. ?  ?traZODone 100 MG tablet ?Commonly known as: DESYREL ?Take 100 mg by mouth at bedtime as needed for sleep. ?  ? ?  ? ? ? Follow-up Information   ? ? Hague, Rosalyn Charters, MD. Schedule an appointment as soon as possible for a visit in 1 week(s).   ?Specialty: Internal Medicine ?Contact information: ?[address] ?[place] [address] ?[phone] ? ? ?  ?  ? ? Sharyn Creamer, MD. Schedule an appointment as soon as possible for a visit in 1 week(s).   ?Specialty: Gastroenterology ?Contact information: ?[address] ?[address] ?[place] [address] ?[phone] ? ? ?  ?  ? ? [facility]  Surgery, [place]. Schedule an appointment as soon as possible for a visit in 1 week(s).   ?Specialty: General Surgery ?Why: Dr Zenia Resides for Hernia repair ?Contact information: ?[address] ?[address] ?[place]

## 2021-04-12 NOTE — Plan of Care (Signed)

## 2021-04-12 NOTE — Discharge Instructions (Signed)
Follow with Primary MD Bonnita Nasuti, MD in 7 days  ? ?Get CBC, CMP, 2 view Chest X ray -  checked next visit within 1 week by Primary MD  ? ?Activity: As tolerated with Full fall precautions use walker/cane & assistance as needed ? ?Disposition Home   ? ?Diet: Heart Healthy   ? ?Special Instructions: If you have smoked or chewed Tobacco  in the last 2 yrs please stop smoking, stop any regular Alcohol  and or any Recreational drug use. ? ?On your next visit with your primary care physician please Get Medicines reviewed and adjusted. ? ?Please request your Prim.MD to go over all Hospital Tests and Procedure/Radiological results at the follow up, please get all Hospital records sent to your Prim MD by signing hospital release before you go home. ? ?If you experience worsening of your admission symptoms, develop shortness of breath, life threatening emergency, suicidal or homicidal thoughts you must seek medical attention immediately by calling 911 or calling your MD immediately  if symptoms less severe. ? ?You Must read complete instructions/literature along with all the possible adverse reactions/side effects for all the Medicines you take and that have been prescribed to you. Take any new Medicines after you have completely understood and accpet all the possible adverse reactions/side effects.  ? ?  ?

## 2021-04-12 NOTE — Progress Notes (Addendum)
? ? ? ?  [facility] Progress Note ? ?CC: cirrhosis, auto paracentesis from her umbilical hernia ? ?Subjective: No ascitic fluid drainage from her umbilical hernia today.  She is sitting up at the bedside.  She is eating a regular diet.  She passed 3 normal brown stools earlier today, no rectal bleeding or black stools.  No chest pain or shortness of breath.  She wants to go home. ? ? ?Objective:  ?Vital signs in last 24 hours: ?Temp:  [98.2 ?F (36.8 ?C)-98.5 ?F (36.9 ?C)] 98.4 ?F (36.9 ?C) (04/10 0323) ?Pulse Rate:  [62-75] 62 (04/10 0323) ?Resp:  [16-20] 20 (04/10 0323) ?BP: (85-157)/(40-129) 85/47 (04/10 0323) ?SpO2:  [96 %-100 %] 100 % (04/10 0323) ?Weight:  [109 kg] 109 kg (04/10 0301) ?Last BM Date : 04/11/21 ?General: Obese 66 year old female no acute distress ?Heart: Regular rate and rhythm, no murmurs. ?Pulm: Breath sounds clear throughout. ?Abdomen: Obese abdomen, soft, not tense.  Umbilical hernia without obvious leakage/drainage.  Scattered patches of ecchymosis (secondary to subcu heparin injections). ?Extremities: Lower extremities with edema, compression stockings in use. ?Neurologic:  Alert and  oriented x 4. Grossly normal neurologically. ?Psych:  Alert and cooperative. Normal mood and affect. ? ?Intake/Output from previous day: ?No intake/output data recorded. ?Intake/Output this shift: ?No intake/output data recorded. ? ?Lab Results: ?Recent Labs  ?  04/10/21 ?0021 04/11/21 ?0110 04/12/21 ?8003  ?WBC 6.7 5.7 6.0  ?HGB 11.1* 11.0* 11.2*  ?HCT 33.3* 33.9* 33.9*  ?PLT 109* 99* 100*  ? ?BMET ?Recent Labs  ?  04/10/21 ?0021 04/11/21 ?0110 04/12/21 ?4917  ?NA 137 135 134*  ?K 4.2 4.5 4.5  ?CL 106 105 104  ?CO2 '25 25 22  '$ ?GLUCOSE 118* 151* 137*  ?BUN '11 12 18  '$ ?CREATININE 1.11* 1.14* 1.36*  ?CALCIUM 8.4* 8.4* 8.8*  ? ?LFT ?Recent Labs  ?  04/12/21 ?9150  ?PROT 5.8*  ?ALBUMIN 2.7*  ?AST 42*  ?ALT 24  ?ALKPHOS 102  ?BILITOT 0.4  ? ?PT/INR ?No results for input(s): LABPROT, INR in the last 72  hours. ?Hepatitis Panel ?No results for input(s): HEPBSAG, HCVAB, HEPAIGM, HEPBIGM in the last 72 hours. ? ?No results found. ? ?Assessment / Plan: ? 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  She is not a surgical candidate.  No ascitic fluid drainage from umbilical hernia site today.  No abdominal pain. ?-Defer discharge recommendations to the hospitalist ?-2 g sodium diet ?-Furosemide 20 mg p.o. daily ?-Antibiotic prophylaxis p.o. x 5 days ?-Consider eventual IR consult for possible TIPS ?-Our office will contact the patient to schedule a follow-up appointment in our GI clinic ? ?2) CKD stage III ? ?3) ETOH cirrhosis - Abstinent from ETOH ? ?4) Chronic anemia, stable ? ?5) Thrombocytopenia  ? ? ?Principal Problem: ?  Decompensated hepatic cirrhosis ([place]) ?Active Problems: ?  Essential hypertension ?  CKD (chronic kidney disease), stage III ([place]) ?  Thrombocytopenia ([place]) ?  Seizure disorder ([place]) ? ? ? ? LOS: 5 days  ? ?Noralyn Pick  04/12/2021, 6:12 PM ? ?I reviewed the patient's chart.  I agree with the APP's note, impression and recommendations.  The patient was discharged home before I was able to see the patient. ? ?Viaan Knippenberg E. Candis Schatz, MD ?[facility] ? ? ?

## 2021-04-12 NOTE — Progress Notes (Signed)
? ?Progress Note ? ?   ?Subjective: ?Feeling well this am. Has not had drainage from umbilicus for a few days. No pain this am. She states she has not had purulence or redness around the site. ? ? ?Objective: ?Vital signs in last 24 hours: ?Temp:  [97.7 ?F (36.5 ?C)-98.5 ?F (36.9 ?C)] 98.4 ?F (36.9 ?C) (04/10 0323) ?Pulse Rate:  [62-75] 62 (04/10 0323) ?Resp:  [16-20] 20 (04/10 0323) ?BP: (85-157)/(40-129) 85/47 (04/10 0323) ?SpO2:  [96 %-100 %] 100 % (04/10 0323) ?Weight:  [109 kg] 109 kg (04/10 0301) ?Last BM Date : 04/11/21 ? ?Intake/Output from previous day: ?No intake/output data recorded. ?Intake/Output this shift: ?No intake/output data recorded. ? ?PE: ?General: pleasant, WD, female who is laying in bed in NAD ?HEENT: head is normocephalic, atraumatic. Mouth is pink and moist ?Lungs: Respiratory effort nonlabored ?Abd: obese, soft, NT, ND, +BS, umbilical hernia no palpable this am and surrounding skin without erythema or induration. Very minimal serous drainage on bandage ?MSK: all 4 extremities are symmetrical with no cyanosis, clubbing, or edema. ?Skin: warm and dry ?Psych: A&Ox3 with an appropriate affect.  ? ? ?Lab Results:  ?Recent Labs  ?  04/11/21 ?0110 04/12/21 ?4944  ?WBC 5.7 6.0  ?HGB 11.0* 11.2*  ?HCT 33.9* 33.9*  ?PLT 99* 100*  ? ?BMET ?Recent Labs  ?  04/11/21 ?0110 04/12/21 ?9675  ?NA 135 134*  ?K 4.5 4.5  ?CL 105 104  ?CO2 25 22  ?GLUCOSE 151* 137*  ?BUN 12 18  ?CREATININE 1.14* 1.36*  ?CALCIUM 8.4* 8.8*  ? ?PT/INR ?No results for input(s): LABPROT, INR in the last 72 hours. ?CMP  ?   ?Component Value Date/Time  ? NA 134 (L) 04/12/2021 0027  ? K 4.5 04/12/2021 0027  ? CL 104 04/12/2021 0027  ? CO2 22 04/12/2021 0027  ? GLUCOSE 137 (H) 04/12/2021 0027  ? BUN 18 04/12/2021 0027  ? CREATININE 1.36 (H) 04/12/2021 0027  ? CALCIUM 8.8 (L) 04/12/2021 0027  ? PROT 5.8 (L) 04/12/2021 0027  ? ALBUMIN 2.7 (L) 04/12/2021 0027  ? AST 42 (H) 04/12/2021 0027  ? ALT 24 04/12/2021 0027  ? ALKPHOS 102  04/12/2021 0027  ? BILITOT 0.4 04/12/2021 0027  ? GFRNONAA 43 (L) 04/12/2021 0027  ? GFRAA 55 (L) 06/17/2015 0526  ? ?Lipase  ?   ?Component Value Date/Time  ? LIPASE 38 04/07/2021 1425  ? ? ? ? ? ?Studies/Results: ?No results found. ? ?Anti-infectives: ?Anti-infectives (From admission, onward)  ? ? Start     Dose/Rate Route Frequency Ordered Stop  ? 04/08/21 0030  cefTRIAXone (ROCEPHIN) 2 g in sodium chloride 0.9 % 100 mL IVPB       ? 2 g ?200 mL/hr over 30 Minutes Intravenous Daily at bedtime 04/08/21 0011    ? ?  ? ? ? ?Assessment/Plan ?Umbilical Hernia  ?Etoh Cirrhosis w/ ascites  ?- no longer draining ascites fluid from umbilicus/hernia and hernia not palpable on exam today. Patient is not established with transplant hepatologist at this time. Given ongoing issues related to adrenal insufficiency do not recommend urgent/emergent repair at this time and that she have close follow up with tertiary care center ? ?FEN: heart ?ID: rocephin 4/6> ?VTE: heparin subq ? ?I reviewed Consultant (GI) notes, hospitalist notes, last 24 h vitals and pain scores, last 48 h intake and output, last 24 h labs and trends, and last 24 h imaging results. ? ? ? LOS: 5 days  ? ?Winferd Humphrey, PA-C ?  [facility] ?04/12/2021, 8:11 AM ?Please see Amion for pager number during day hours 7:00am-4:30pm ? ?

## 2021-04-12 NOTE — TOC Transition Note (Signed)
Transition of Care (TOC) - CM/SW Discharge Note ? ? ?Patient Details  ?Name: Carla Leonard ?MRN: [id number] ?Date of Birth: 1955/03/10 ? ?Transition of Care (TOC) CM/SW Contact:  ?Cyndi Bender, RN ?Phone Number: ?04/12/2021, 12:26 PM ? ? ?Clinical Narrative:    ?Patient stable for discharge. Daughter will transport home. Patient was active with [facility] but PT doesn't recommend Home health. Patient doesn't feel like she needs home health.  ? ? ? ?Final next level of care: Home/Self Care ?Barriers to Discharge: Barriers Resolved ? ? ?Patient Goals and CMS Choice ?Patient states their goals for this hospitalization and ongoing recovery are:: return home ?  ?  ? ?Discharge Placement ?  ?           ?  ? home ?  ?  ? ?Discharge Plan and Services ?  ? Home  ?           ?  ?  ?  ?  ?  ?  ?  ?  ?  ?  ? ?Social Determinants of Health (SDOH) Interventions ?  ? ? ?Readmission Risk Interventions ? ?  04/12/2021  ? 12:25 PM  ?Readmission Risk Prevention Plan  ?Transportation Screening Complete  ?PCP or Specialist Appt within 3-5 Days Complete  ?[place] or Home Care Consult Complete  ?Social Work Consult for [place] Planning/Counseling Complete  ?Palliative Care Screening Not Applicable  ?Medication Review Press photographer) Complete  ? ? ? ? ? ?

## 2021-04-14 ENCOUNTER — Telehealth: Payer: Self-pay | Admitting: Internal Medicine

## 2021-04-14 NOTE — Telephone Encounter (Signed)
Patient called, would like to be referred to a hepatologist. Please advise.  ?

## 2021-04-14 NOTE — Telephone Encounter (Signed)
Pt stated that she was recently admitted to the hospital and discharged yesterday: Pt stated that the Hospital Dr's are encouraging pt to see a liver specialist and pt is  requesting a referral: ?Please advise  ?

## 2021-04-15 NOTE — Telephone Encounter (Signed)
Referral  Form and pt records sent to  transplant hepatology at [facility]:  ?Left message for pt to call back  ?

## 2021-04-16 NOTE — Telephone Encounter (Signed)
Pt notified of Dr. Lorenso Courier recommendations: ?Pt notified that referral has been sent for the hepatology at [facility] and that they will contact her: ?Pt rescheduled for the soonest appointment to see Dr. Lorenso Courier: Pt scheduled for 05/12/2021 at 2:10: Pt made aware ?Pt verbalized understanding with all questions answered.  ? ?

## 2021-04-16 NOTE — Telephone Encounter (Signed)
Patient returned call

## 2021-04-16 NOTE — Telephone Encounter (Signed)
Left message for pt to call back  °

## 2021-04-19 ENCOUNTER — Telehealth (HOSPITAL_BASED_OUTPATIENT_CLINIC_OR_DEPARTMENT_OTHER): Payer: Self-pay

## 2021-04-19 ENCOUNTER — Other Ambulatory Visit (HOSPITAL_COMMUNITY): Payer: Self-pay

## 2021-04-19 ENCOUNTER — Other Ambulatory Visit (HOSPITAL_BASED_OUTPATIENT_CLINIC_OR_DEPARTMENT_OTHER): Payer: Self-pay

## 2021-04-19 NOTE — Telephone Encounter (Signed)
Pharmacy Transitions of Care Follow-up Telephone Call ? ?Date of discharge: 04/12/21  ?Discharge Diagnosis: Ascites ? ?How have you been since you were released from the hospital? Patient is doing better but wants to get her energy back and is starting to get a breakout in her mouth. She will reach out to her MD to see if she can get an RX for magic mouthwash. The patient has transportation issues, so has not been able to make many follow up appointments. I recommended to use [facility] mail order to resolve 1 trip, which she appreciated, [facility] order (address was confirmed).  ? ?Medication changes made at discharge: ? CONTINUE taking these medications ? ?CONTINUE taking these medications  ?ALPRAZolam 0.5 MG tablet ?Commonly known as: XANAX   ?budesonide-formoterol 160-4.5 MCG/ACT inhaler ?Commonly known as: SYMBICORT   ?cefUROXime 250 MG tablet ?Commonly known as: CEFTIN ?Take 1 tablet (250 mg total) by mouth 2 (two) times daily with a meal.   ?feeding supplement Liqd   ?furosemide 20 MG tablet ?Commonly known as: LASIX ?Take 1 tablet (20 mg total) by mouth daily.   ?hydrocortisone 10 MG tablet ?Commonly known as: CORTEF ?Take 2 tablets (20 mg total) by mouth daily. Do not stop it abruptly unless told by the endocrinologist.   ?levETIRAcetam 500 MG tablet ?Commonly known as: KEPPRA ?Take 1 tablet (500 mg total) by mouth 2 (two) times daily.   ?midodrine 10 MG tablet ?Commonly known as: PROAMATINE ?Take 1 tablet (10 mg total) by mouth 3 (three) times daily with meals.   ?primidone 50 MG tablet ?Commonly known as: MYSOLINE   ?promethazine 12.5 MG tablet ?Commonly known as: PHENERGAN   ?traMADol 50 MG tablet ?Commonly known as: ULTRAM   ?traZODone 100 MG tablet ?Commonly known as: DESYREL   ? ? ?Medication changes verified by the patient? yes ?  ? ?Medication Accessibility: ? ?[facility] [address]  ? ?Was the patient provided with refills on discharged medications?  yes  ? ?Have all prescriptions been transferred from [facility] to home pharmacy? yes  ? ?Is the patient able to afford medications? [employer] ?Notable copays: $0 ?Eligible patient assistance: no ?  ? ?Medication Review: ? ? ? ?Follow-up Appointments: ?Date Visit Type Length Department   ? 05/12/2021  2:10 PM FOLLOW UP 20 20 min [facility]   ? ? ?If their condition worsens, is the pt aware to call PCP or go to the Emergency Dept.? yes ? ?Final Patient Assessment: ?Patient is doing better but wants to get her energy back and is starting to get a breakout in her mouth. She will reach out to her MD to see if she can get an RX for magic mouthwash. The patient has transportation issues, so has not been able to make many follow up appointments. I recommended to use [facility] mail order to resolve 1 trip, which she appreciated, [facility] (address was confirmed).  ? ?Darcus Austin, PharmD ?Clinical Pharmacist ?[facility] ?04/19/2021 11:34 AM ? ?

## 2021-04-20 ENCOUNTER — Other Ambulatory Visit (HOSPITAL_COMMUNITY): Payer: Self-pay

## 2021-04-20 MED ORDER — ALPRAZOLAM 0.5 MG PO TABS: ORAL_TABLET | ORAL | 2 refills | Status: DC

## 2021-04-20 MED ORDER — LEVETIRACETAM 500 MG PO TABS: ORAL_TABLET | ORAL | 10 refills | Status: DC

## 2021-04-20 MED ORDER — SPIRONOLACTONE 25 MG PO TABS: ORAL_TABLET | ORAL | 2 refills | Status: DC

## 2021-04-22 DIAGNOSIS — I509 Heart failure, unspecified: Secondary | ICD-10-CM | POA: Diagnosis not present

## 2021-04-22 DIAGNOSIS — R059 Cough, unspecified: Secondary | ICD-10-CM | POA: Diagnosis not present

## 2021-04-22 DIAGNOSIS — R197 Diarrhea, unspecified: Secondary | ICD-10-CM | POA: Diagnosis not present

## 2021-04-27 ENCOUNTER — Emergency Department (HOSPITAL_COMMUNITY): Payer: Medicare HMO

## 2021-04-27 ENCOUNTER — Telehealth: Payer: Self-pay | Admitting: Internal Medicine

## 2021-04-27 ENCOUNTER — Inpatient Hospital Stay (HOSPITAL_COMMUNITY)
Admission: EM | Admit: 2021-04-27 | Discharge: 2021-05-01 | DRG: 433 | Disposition: A | Discharge status: Home / Self Care | Payer: Medicare HMO | Attending: Internal Medicine | Admitting: Internal Medicine

## 2021-04-27 ENCOUNTER — Other Ambulatory Visit: Payer: Self-pay

## 2021-04-27 ENCOUNTER — Emergency Department (HOSPITAL_COMMUNITY)
Admit: 2021-04-27 | Discharge: 2021-04-27 | Disposition: A | Discharge status: Home / Self Care | Payer: Medicare HMO | Attending: Emergency Medicine | Admitting: Emergency Medicine

## 2021-04-27 ENCOUNTER — Encounter (HOSPITAL_COMMUNITY): Payer: Self-pay | Admitting: Emergency Medicine

## 2021-04-27 DIAGNOSIS — Z6841 Body Mass Index (BMI) 40.0 and over, adult: Secondary | ICD-10-CM | POA: Diagnosis not present

## 2021-04-27 DIAGNOSIS — M1990 Unspecified osteoarthritis, unspecified site: Secondary | ICD-10-CM | POA: Diagnosis present

## 2021-04-27 DIAGNOSIS — K7031 Alcoholic cirrhosis of liver with ascites: Secondary | ICD-10-CM | POA: Diagnosis present

## 2021-04-27 DIAGNOSIS — F32A Depression, unspecified: Secondary | ICD-10-CM | POA: Diagnosis present

## 2021-04-27 DIAGNOSIS — I509 Heart failure, unspecified: Secondary | ICD-10-CM | POA: Diagnosis not present

## 2021-04-27 DIAGNOSIS — D696 Thrombocytopenia, unspecified: Secondary | ICD-10-CM | POA: Diagnosis present

## 2021-04-27 DIAGNOSIS — Z888 Allergy status to other drugs, medicaments and biological substances status: Secondary | ICD-10-CM

## 2021-04-27 DIAGNOSIS — I10 Essential (primary) hypertension: Secondary | ICD-10-CM | POA: Diagnosis not present

## 2021-04-27 DIAGNOSIS — F419 Anxiety disorder, unspecified: Secondary | ICD-10-CM | POA: Diagnosis present

## 2021-04-27 DIAGNOSIS — K429 Umbilical hernia without obstruction or gangrene: Secondary | ICD-10-CM | POA: Diagnosis present

## 2021-04-27 DIAGNOSIS — Z882 Allergy status to sulfonamides status: Secondary | ICD-10-CM

## 2021-04-27 DIAGNOSIS — K7011 Alcoholic hepatitis with ascites: Secondary | ICD-10-CM | POA: Diagnosis present

## 2021-04-27 DIAGNOSIS — K766 Portal hypertension: Secondary | ICD-10-CM | POA: Diagnosis not present

## 2021-04-27 DIAGNOSIS — E2740 Unspecified adrenocortical insufficiency: Secondary | ICD-10-CM | POA: Diagnosis present

## 2021-04-27 DIAGNOSIS — K7040 Alcoholic hepatic failure without coma: Secondary | ICD-10-CM | POA: Diagnosis present

## 2021-04-27 DIAGNOSIS — Z9049 Acquired absence of other specified parts of digestive tract: Secondary | ICD-10-CM | POA: Diagnosis not present

## 2021-04-27 DIAGNOSIS — Z96651 Presence of right artificial knee joint: Secondary | ICD-10-CM | POA: Diagnosis present

## 2021-04-27 DIAGNOSIS — E876 Hypokalemia: Secondary | ICD-10-CM | POA: Diagnosis present

## 2021-04-27 DIAGNOSIS — R0602 Shortness of breath: Secondary | ICD-10-CM | POA: Diagnosis not present

## 2021-04-27 DIAGNOSIS — I82432 Acute embolism and thrombosis of left popliteal vein: Secondary | ICD-10-CM | POA: Diagnosis present

## 2021-04-27 DIAGNOSIS — Z743 Need for continuous supervision: Secondary | ICD-10-CM | POA: Diagnosis not present

## 2021-04-27 DIAGNOSIS — N182 Chronic kidney disease, stage 2 (mild): Secondary | ICD-10-CM | POA: Diagnosis present

## 2021-04-27 DIAGNOSIS — R1084 Generalized abdominal pain: Secondary | ICD-10-CM | POA: Diagnosis not present

## 2021-04-27 DIAGNOSIS — Z7951 Long term (current) use of inhaled steroids: Secondary | ICD-10-CM

## 2021-04-27 DIAGNOSIS — G40909 Epilepsy, unspecified, not intractable, without status epilepticus: Secondary | ICD-10-CM | POA: Diagnosis present

## 2021-04-27 DIAGNOSIS — F339 Major depressive disorder, recurrent, unspecified: Secondary | ICD-10-CM | POA: Diagnosis not present

## 2021-04-27 DIAGNOSIS — Z79899 Other long term (current) drug therapy: Secondary | ICD-10-CM

## 2021-04-27 DIAGNOSIS — R609 Edema, unspecified: Secondary | ICD-10-CM

## 2021-04-27 DIAGNOSIS — I129 Hypertensive chronic kidney disease with stage 1 through stage 4 chronic kidney disease, or unspecified chronic kidney disease: Secondary | ICD-10-CM | POA: Diagnosis present

## 2021-04-27 DIAGNOSIS — N183 Chronic kidney disease, stage 3 unspecified: Secondary | ICD-10-CM | POA: Diagnosis not present

## 2021-04-27 DIAGNOSIS — E8770 Fluid overload, unspecified: Secondary | ICD-10-CM | POA: Diagnosis present

## 2021-04-27 DIAGNOSIS — Z833 Family history of diabetes mellitus: Secondary | ICD-10-CM

## 2021-04-27 DIAGNOSIS — Z886 Allergy status to analgesic agent status: Secondary | ICD-10-CM

## 2021-04-27 DIAGNOSIS — K7460 Unspecified cirrhosis of liver: Secondary | ICD-10-CM | POA: Diagnosis not present

## 2021-04-27 DIAGNOSIS — I110 Hypertensive heart disease with heart failure: Secondary | ICD-10-CM | POA: Diagnosis not present

## 2021-04-27 DIAGNOSIS — R188 Other ascites: Secondary | ICD-10-CM | POA: Diagnosis not present

## 2021-04-27 DIAGNOSIS — R54 Age-related physical debility: Secondary | ICD-10-CM | POA: Diagnosis not present

## 2021-04-27 DIAGNOSIS — F17210 Nicotine dependence, cigarettes, uncomplicated: Secondary | ICD-10-CM | POA: Diagnosis present

## 2021-04-27 DIAGNOSIS — R161 Splenomegaly, not elsewhere classified: Secondary | ICD-10-CM | POA: Diagnosis not present

## 2021-04-27 LAB — COMPREHENSIVE METABOLIC PANEL
ALT: 32 U/L (ref 0–44)
AST: 41 U/L (ref 15–41)
Albumin: 2.7 g/dL — ABNORMAL LOW (ref 3.5–5.0)
Alkaline Phosphatase: 140 U/L — ABNORMAL HIGH (ref 38–126)
Anion gap: 6 (ref 5–15)
BUN: 19 mg/dL (ref 8–23)
CO2: 27 mmol/L (ref 22–32)
Calcium: 8.3 mg/dL — ABNORMAL LOW (ref 8.9–10.3)
Chloride: 103 mmol/L (ref 98–111)
Creatinine, Ser: 1 mg/dL (ref 0.44–1.00)
GFR, Estimated: >60 mL/min (ref >60)
Glucose, Bld: 106 mg/dL — ABNORMAL HIGH (ref 70–99)
Potassium: 3.3 mmol/L — ABNORMAL LOW (ref 3.5–5.1)
Sodium: 136 mmol/L (ref 135–145)
Total Bilirubin: 1.4 mg/dL — ABNORMAL HIGH (ref 0.3–1.2)
Total Protein: 5.9 g/dL — ABNORMAL LOW (ref 6.5–8.1)

## 2021-04-27 LAB — CBC WITH DIFFERENTIAL/PLATELET
Abs Immature Granulocytes: 0.05 10*3/uL (ref 0.00–0.07)
Basophils Absolute: 0 10*3/uL (ref 0.0–0.1)
Basophils Relative: 0 %
Eosinophils Absolute: 0.1 10*3/uL (ref 0.0–0.5)
Eosinophils Relative: 1 %
HCT: 32.6 % — ABNORMAL LOW (ref 36.0–46.0)
Hemoglobin: 10.8 g/dL — ABNORMAL LOW (ref 12.0–15.0)
Immature Granulocytes: 1 %
Lymphocytes Relative: 7 %
Lymphs Abs: 0.6 10*3/uL — ABNORMAL LOW (ref 0.7–4.0)
MCH: 33.5 pg (ref 26.0–34.0)
MCHC: 33.1 g/dL (ref 30.0–36.0)
MCV: 101.2 fL — ABNORMAL HIGH (ref 80.0–100.0)
Monocytes Absolute: 0.6 10*3/uL (ref 0.1–1.0)
Monocytes Relative: 8 %
Neutro Abs: 6.4 10*3/uL (ref 1.7–7.7)
Neutrophils Relative %: 83 %
Platelets: 70 10*3/uL — ABNORMAL LOW (ref 150–400)
RBC: 3.22 MIL/uL — ABNORMAL LOW (ref 3.87–5.11)
RDW: 16 % — ABNORMAL HIGH (ref 11.5–15.5)
WBC: 7.8 10*3/uL (ref 4.0–10.5)
nRBC: 0 % (ref 0.0–0.2)

## 2021-04-27 LAB — PROTIME-INR
INR: 1.4 — ABNORMAL HIGH (ref 0.8–1.2)
Prothrombin Time: 16.5 seconds — ABNORMAL HIGH (ref 11.4–15.2)

## 2021-04-27 LAB — MAGNESIUM: Magnesium: 2 mg/dL (ref 1.7–2.4)

## 2021-04-27 MED ORDER — HEPARIN (PORCINE) 25000 UT/250ML-% IV SOLN
1400.0000 [IU]/h | INTRAVENOUS | Status: DC
  Administered 2021-04-27: 1800 [IU]/h via INTRAVENOUS
  Administered 2021-04-28: 1600 [IU]/h via INTRAVENOUS
  Administered 2021-04-29: 1400 [IU]/h via INTRAVENOUS
  Filled 2021-04-27 (×4): qty 250

## 2021-04-27 MED ORDER — POTASSIUM CHLORIDE 20 MEQ PO PACK: 40.0000 meq | PACK | Freq: Once | ORAL | Status: DC

## 2021-04-27 MED ORDER — SPIRONOLACTONE 25 MG PO TABS
25.0000 mg | ORAL_TABLET | Freq: Every day | ORAL | Status: DC
  Filled 2021-04-27: qty 1

## 2021-04-27 MED ORDER — HEPARIN BOLUS VIA INFUSION
3000.0000 [IU] | Freq: Once | INTRAVENOUS | Status: AC
  Administered 2021-04-27: 3000 [IU] via INTRAVENOUS
  Filled 2021-04-27: qty 3000

## 2021-04-27 MED ORDER — TRAMADOL HCL 50 MG PO TABS: 50.0000 mg | ORAL_TABLET | Freq: Two times a day (BID) | ORAL | Status: DC | PRN

## 2021-04-27 MED ORDER — PRIMIDONE 50 MG PO TABS
100.0000 mg | ORAL_TABLET | Freq: Every morning | ORAL | Status: DC
  Administered 2021-04-28 – 2021-05-01 (×4): 100 mg via ORAL
  Filled 2021-04-27 (×4): qty 2

## 2021-04-27 MED ORDER — HYDROCORTISONE 20 MG PO TABS
20.0000 mg | ORAL_TABLET | Freq: Every day | ORAL | Status: DC
  Filled 2021-04-27: qty 1

## 2021-04-27 MED ORDER — FUROSEMIDE 20 MG PO TABS
20.0000 mg | ORAL_TABLET | Freq: Every day | ORAL | Status: DC
Start: 2021-04-28 — End: 2021-04-28

## 2021-04-27 MED ORDER — SODIUM CHLORIDE 0.9% FLUSH
3.0000 mL | Freq: Two times a day (BID) | INTRAVENOUS | Status: DC
  Administered 2021-04-28 – 2021-05-01 (×5): 3 mL via INTRAVENOUS

## 2021-04-27 MED ORDER — POLYETHYLENE GLYCOL 3350 17 G PO PACK: 17.0000 g | PACK | Freq: Every day | ORAL | Status: DC | PRN

## 2021-04-27 MED ORDER — ALPRAZOLAM 0.25 MG PO TABS: 1.0000 mg | ORAL_TABLET | Freq: Three times a day (TID) | ORAL | Status: DC | PRN

## 2021-04-27 MED ORDER — ACETAMINOPHEN 650 MG RE SUPP: 650.0000 mg | Freq: Four times a day (QID) | RECTAL | Status: DC | PRN

## 2021-04-27 MED ORDER — MIDODRINE HCL 5 MG PO TABS
10.0000 mg | ORAL_TABLET | Freq: Three times a day (TID) | ORAL | Status: DC
  Administered 2021-04-28 – 2021-05-01 (×9): 10 mg via ORAL
  Filled 2021-04-27 (×9): qty 2

## 2021-04-27 MED ORDER — ACETAMINOPHEN 325 MG PO TABS: 650.0000 mg | ORAL_TABLET | Freq: Four times a day (QID) | ORAL | Status: DC | PRN

## 2021-04-27 MED ORDER — LEVETIRACETAM 500 MG PO TABS
500.0000 mg | ORAL_TABLET | Freq: Two times a day (BID) | ORAL | Status: DC
  Administered 2021-04-28 – 2021-05-01 (×7): 500 mg via ORAL
  Filled 2021-04-27 (×6): qty 1

## 2021-04-27 MED ORDER — TRAZODONE HCL 100 MG PO TABS: 100.0000 mg | ORAL_TABLET | Freq: Every evening | ORAL | Status: DC | PRN

## 2021-04-27 NOTE — ED Triage Notes (Signed)
BIB [facility] from home. Increasing ascites and lower extremity swelling for the past week. PCP has been increasing lasix continuously with no results.  No leaking at this time as she states that sometimes does happen but not today. Pt is jaundiced but has no complaints of pain or shob.  ?

## 2021-04-27 NOTE — ED Notes (Signed)
Got patient undressed into a gown on the monitor did ekg shown to Dr Tamera Punt patient is resting with call bell in reach  ?

## 2021-04-27 NOTE — Progress Notes (Signed)
ANTICOAGULATION CONSULT NOTE - Initial Consult ? ?Pharmacy Consult for heparin ?Indication: DVT ? ?Allergies  ?Allergen Reactions  ? Sulfa Antibiotics Anaphylaxis  ? Prednisone Other (See Comments)  ?  hallucinations  ? Nsaids Other (See Comments)  ?  Stomach pain, diarrhea  ? Other Other (See Comments)  ?  Steroids: hallucinations  ? ? ?Patient Measurements: ?  ?Heparin Dosing Weight: 109 kg  ? ?Vital Signs: ?Temp: 98.8 ?F (37.1 ?C) (04/25 1657) ?Temp Source: Oral (04/25 1657) ?BP: 100/73 (04/25 1815) ?Pulse Rate: 70 (04/25 1815) ? ?Labs: ?Recent Labs  ?  04/27/21 ?2683  ?HGB 10.8*  ?HCT 32.6*  ?PLT 70*  ?CREATININE 1.00  ? ? ?CrCl cannot be calculated (Unknown ideal weight.). ? ? ?Medical History: ?Past Medical History:  ?Diagnosis Date  ? Arthritis   ? Depression   ? History of kidney stones   ? Hypertension   ? ? ?Medications:  ?(Not in a hospital admission)  ? ?Assessment: ?64 YOF with increased ascites found to have an acute left sided DVT of popliteal vein. Pharmacy consulted to start IV heparin.  ? ?H/H low. Plt also low at 70k.  ? ?Goal of Therapy:  ?Heparin level ~0.5 units/ml ?Monitor platelets by anticoagulation protocol: Yes ?  ?Plan:  ?-Given thrombocytopenia, will give a lower heparin bolus of 3000 units IV followed by heparin infusion at 1800 units/hr ?-F/u 6 hr HL ?-Monitor daily HL, CBC and watch for s/s of bleeding  ? ?Albertina Parr, PharmD., BCCCP ?Clinical Pharmacist ?Please refer to AMION for unit-specific pharmacist  ? ? ? ?

## 2021-04-27 NOTE — H&P (Signed)
?History and Physical  ? ?Carla Leonard NIO:[id number] DOB: 10/11/1955 DOA: 04/27/2021 ? ?PCP: Bonnita Nasuti, MD  ? ?Patient coming from: Home ? ?Chief Complaint: Ascites, edema ? ?HPI: Carla Leonard is a 66 y.o. female with medical history significant of cirrhosis, adrenal insufficiency, CKD 3, anxiety, depression, hypertension, seizures presenting with ascites and edema. ? ?Patient with known history of cirrhosis reports 1 week of increasing ascites and lower extremity edema.  Has tried increasing her home Lasix dose with no significant improvement.  Did have significant urine output yesterday but not today.  She has not noticed any leaking of her ascitic fluid (has a history of auto paracentesis from her umbilical hernia) does report some shortness of breath and also recent femur fracture. ? ?She denies fevers, chills, chest pain, abdominal pain, constipation, diarrhea, nausea, vomiting. ? ?ED Course: Vital signs in the ED significant for blood pressure in the 818E to 993Z systolic which is her baseline.  Lab work-up included CMP which showed potassium 3.3, glucose 106, calcium 8.3, protein 5.9, albumin 2.8, alk phos 140, T. bili 1.4.  CBC with hemoglobin stable at 10.8 and platelets mildly down at 70 from a baseline of 90.  Chest x-ray showed no acute abnormality.  CT abdomen pelvis showed large ascites increased from earlier this month, cirrhosis, subcutaneous edema in the lower abdominal wall with recommendation to correlate for possible cellulitis.  Patient started on heparin in the ED due to DVT study that returned negative on the right but positive for popliteal vein DVT on the left. ? ?Review of Systems: As per HPI otherwise all other systems reviewed and are negative. ? ?Past Medical History:  ?Diagnosis Date  ? Arthritis   ? Depression   ? History of kidney stones   ? Hypertension   ? ? ?Past Surgical History:  ?Procedure Laterality Date  ? APPENDECTOMY    ? CHOLECYSTECTOMY    ? ELBOW SURGERY    ?  RIGHT  ? JOINT REPLACEMENT    ? right TKA 06/2010  ? KNEE ARTHROPLASTY  06/17/2011  ? Procedure: COMPUTER ASSISTED TOTAL KNEE ARTHROPLASTY;  Surgeon: Alta Corning, MD;  Location: [place];  Service: Orthopedics;  Laterality: Left;  TOTAL KNEE REPLACEMENT WITH GENERAL ANESTHESIA AND PRE OP FEMORAL NERVE BLOCK  ? PERIANAL CYST    ? ? ?Social History ? reports that she has been smoking cigarettes. She has been smoking an average of .25 packs per day. She does not have any smokeless tobacco history on file. She reports current alcohol use. She reports that she does not use drugs. ? ?Allergies  ?Allergen Reactions  ? Sulfa Antibiotics Anaphylaxis  ? Prednisone Other (See Comments)  ?  hallucinations  ? Nsaids Other (See Comments)  ?  Stomach pain, diarrhea  ? Other Other (See Comments)  ?  Steroids: hallucinations  ? ? ?Family History  ?Problem Relation Age of Onset  ? Pneumonia Mother   ? Diabetes Father   ? Colon cancer Neg Hx   ? Esophageal cancer Neg Hx   ? Stomach cancer Neg Hx   ? Pancreatic cancer Neg Hx   ? Colon polyps Neg Hx   ?Reviewed on admission ? ?Prior to Admission medications   ?Medication Sig Start Date End Date Taking? Authorizing Provider  ?ALPRAZolam (XANAX) 0.5 MG tablet Take 0.5 mg by mouth at bedtime as needed for sleep.    [provider]  ?ALPRAZolam Duanne Moron) 0.5 MG tablet Take 1 tablet by mouth 2 times  a day as needed 03/05/21     ?budesonide-formoterol (SYMBICORT) 160-4.5 MCG/ACT inhaler Inhale 2 puffs into the lungs 2 (two) times daily as needed (shortness of breath/wheezing). 01/20/21   [provider]  ?cefUROXime (CEFTIN) 250 MG tablet Take 1 tablet (250 mg total) by mouth 2 (two) times daily with a meal. 04/12/21   Thurnell Lose, MD  ?feeding supplement (BOOST HIGH PROTEIN) LIQD Take 1 Container by mouth 2 (two) times daily as needed (appetite).    [provider]  ?furosemide (LASIX) 20 MG tablet Take 1 tablet (20 mg total) by mouth daily. 04/12/21   Thurnell Lose, MD  ?hydrocortisone (CORTEF) 10 MG tablet Take 2 tablets (20 mg total) by mouth daily.  Do not stop it abruptly unless told by the endocrinologist. 04/12/21   Thurnell Lose, MD  ?levETIRAcetam (KEPPRA) 500 MG tablet Take 1 tablet (500 mg total) by mouth 2 (two) times daily. 04/12/21   Thurnell Lose, MD  ?levETIRAcetam (KEPPRA) 500 MG tablet Take 1 tablet by mouth 2 times a day 08/21/20     ?midodrine (PROAMATINE) 10 MG tablet Take 1 tablet (10 mg total) by mouth 3 (three) times daily with meals. 04/12/21   Thurnell Lose, MD  ?primidone (MYSOLINE) 50 MG tablet Take 100 mg by mouth every morning. 01/21/21   [provider]  ?promethazine (PHENERGAN) 12.5 MG tablet Take 12.5 mg by mouth every 4 (four) hours as needed for nausea or vomiting. 01/20/21   [provider]  ?spironolactone (ALDACTONE) 25 MG tablet Take 1 tablet by mouth once a day 04/02/21     ?traMADol (ULTRAM) 50 MG tablet Take 50 mg by mouth 2 (two) times daily as needed for moderate pain. 03/25/21   [provider]  ?traZODone (DESYREL) 100 MG tablet Take 100 mg by mouth at bedtime as needed for sleep. 01/20/21   [provider]  ? ? ?Physical Exam: ?Vitals:  ? 04/27/21 1657 04/27/21 1815 04/27/21 1930 04/27/21 2015  ?BP: (!) 119/47 100/73 (!) 101/49 (!) 103/51  ?Pulse: 77 70 77 70  ?Resp: 20 13 20 15   ?Temp: 98.8 ?F (37.1 ?C)     ?TempSrc: Oral     ?SpO2: 99% 100% 100% 99%  ? ? ?Physical Exam ?Constitutional:   ?   General: She is not in acute distress. ?   Appearance: Normal appearance.  ?HENT:  ?   Head: Normocephalic and atraumatic.  ?   Mouth/Throat:  ?   Mouth: Mucous membranes are moist.  ?   Pharynx: Oropharynx is clear.  ?Eyes:  ?   Extraocular Movements: Extraocular movements intact.  ?   Pupils: Pupils are equal, round, and reactive to light.  ?Cardiovascular:  ?   Rate and Rhythm: Normal rate and regular rhythm.  ?   Pulses: Normal pulses.  ?   Heart sounds: Normal heart sounds.  ?Pulmonary:  ?    Effort: Pulmonary effort is normal. No respiratory distress.  ?   Breath sounds: Normal breath sounds.  ?Abdominal:  ?   General: Bowel sounds are normal. There is distension.  ?   Palpations: Abdomen is soft.  ?   Tenderness: There is no abdominal tenderness.  ?Musculoskeletal:     ?   General: No swelling or deformity.  ?   Right lower leg: Edema present.  ?   Left lower leg: Edema present.  ?Skin: ?   General: Skin is warm and dry.  ?Neurological:  ?  General: No focal deficit present.  ?   Mental Status: Mental status is at baseline.  ? ?Labs on Admission: I have personally reviewed following labs and imaging studies ? ?CBC: ?Recent Labs  ?Lab 04/27/21 ?1716  ?WBC 7.8  ?NEUTROABS 6.4  ?HGB 10.8*  ?HCT 32.6*  ?MCV 101.2*  ?PLT 70*  ? ? ?Basic Metabolic Panel: ?Recent Labs  ?Lab 04/27/21 ?1716  ?NA 136  ?K 3.3*  ?CL 103  ?CO2 27  ?GLUCOSE 106*  ?BUN 19  ?CREATININE 1.00  ?CALCIUM 8.3*  ? ? ?GFR: ?CrCl cannot be calculated (Unknown ideal weight.). ? ?Liver Function Tests: ?Recent Labs  ?Lab 04/27/21 ?1716  ?AST 41  ?ALT 32  ?ALKPHOS 140*  ?BILITOT 1.4*  ?PROT 5.9*  ?ALBUMIN 2.7*  ? ? ?Urine analysis: ?   ?Component Value Date/Time  ? [place] YELLOW 04/08/2021 0817  ? APPEARANCEUR CLEAR 04/08/2021 0817  ? LABSPEC 1.014 04/08/2021 0817  ? PHURINE 6.0 04/08/2021 0817  ? [place] NEGATIVE 04/08/2021 0817  ? [place] NEGATIVE 04/08/2021 0817  ? [place] NEGATIVE 04/08/2021 0817  ? [place] NEGATIVE 04/08/2021 0817  ? [place] NEGATIVE 04/08/2021 0817  ? UROBILINOGEN 0.2 06/17/2011 0636  ? NITRITE NEGATIVE 04/08/2021 0817  ? LEUKOCYTESUR NEGATIVE 04/08/2021 0817  ? ? ?Radiological Exams on Admission: ?CT Abdomen Pelvis Wo Contrast ? ?Result Date: 04/27/2021 ?CLINICAL DATA:  Increasing ascites and lower extremity swelling. EXAM: CT ABDOMEN AND PELVIS WITHOUT CONTRAST TECHNIQUE: Multidetector CT imaging of the abdomen and pelvis was performed following the standard protocol without IV contrast. RADIATION DOSE  REDUCTION: This exam was performed according to the departmental dose-optimization program which includes automated exposure control, adjustment of the mA and/or kV according to patient size and/or use of iterativ

## 2021-04-27 NOTE — ED Provider Notes (Signed)
?[facility] ?Provider Note ? ? ?CSN: [id number] ?Arrival date & time: 04/27/21  1642 ? ?  ? ?History ? ?Chief Complaint  ?Patient presents with  ? Ascites  ? ? ?Carla Leonard is a 66 y.o. female. ? ?Patient is a 66 year old female who presents with increased ascites.  She has a history of alcoholic cirrhosis, chronic kidney disease, seizures.  She says that she has not had any alcohol intake in years.  Over the last few days she has had some increased swelling in her abdomen and her lower extremities.  She has had some increased shortness of breath, primarily when laying flat.  No associated chest pain.  No abdominal pain.  No fevers.  She has some increase in her lower extremity swelling.  Her left is a little bit more increased from her rate.  She says that its been happening recently.  She does have a recent history of a distal femur fracture in the left leg in March of this year.  She says been healing well.  She had a recent admission earlier this month for increased ascites with drainage from her umbilical hernia.  She does say that she has home health that recently has increased her Lasix.  She said she started taking 4 tablets a day yesterday.  She did urinate a lot yesterday and her leg swelling improved just a little bit but she has not urinated today.  On chart review, she is on Lasix 20 mg.  She is also on spironolactone. ? ? ?  ? ?Home Medications ?Prior to Admission medications   ?Medication Sig Start Date End Date Taking? Authorizing Provider  ?ALPRAZolam (XANAX) 0.5 MG tablet Take 0.5 mg by mouth at bedtime as needed for sleep.    [provider]  ?ALPRAZolam Duanne Moron) 0.5 MG tablet Take 1 tablet by mouth 2 times a day as needed 03/05/21     ?budesonide-formoterol (SYMBICORT) 160-4.5 MCG/ACT inhaler Inhale 2 puffs into the lungs 2 (two) times daily as needed (shortness of breath/wheezing). 01/20/21   [provider]  ?cefUROXime (CEFTIN) 250 MG  tablet Take 1 tablet (250 mg total) by mouth 2 (two) times daily with a meal. 04/12/21   Thurnell Lose, MD  ?feeding supplement (BOOST HIGH PROTEIN) LIQD Take 1 Container by mouth 2 (two) times daily as needed (appetite).    [provider]  ?furosemide (LASIX) 20 MG tablet Take 1 tablet (20 mg total) by mouth daily. 04/12/21   Thurnell Lose, MD  ?hydrocortisone (CORTEF) 10 MG tablet Take 2 tablets (20 mg total) by mouth daily.  Do not stop it abruptly unless told by the endocrinologist. 04/12/21   Thurnell Lose, MD  ?levETIRAcetam (KEPPRA) 500 MG tablet Take 1 tablet (500 mg total) by mouth 2 (two) times daily. 04/12/21   Thurnell Lose, MD  ?levETIRAcetam (KEPPRA) 500 MG tablet Take 1 tablet by mouth 2 times a day 08/21/20     ?midodrine (PROAMATINE) 10 MG tablet Take 1 tablet (10 mg total) by mouth 3 (three) times daily with meals. 04/12/21   Thurnell Lose, MD  ?primidone (MYSOLINE) 50 MG tablet Take 100 mg by mouth every morning. 01/21/21   [provider]  ?promethazine (PHENERGAN) 12.5 MG tablet Take 12.5 mg by mouth every 4 (four) hours as needed for nausea or vomiting. 01/20/21   [provider]  ?spironolactone (ALDACTONE) 25 MG tablet Take 1 tablet by mouth once a day 04/02/21     ?  traMADol (ULTRAM) 50 MG tablet Take 50 mg by mouth 2 (two) times daily as needed for moderate pain. 03/25/21   [provider]  ?traZODone (DESYREL) 100 MG tablet Take 100 mg by mouth at bedtime as needed for sleep. 01/20/21   [provider]  ?   ? ?Allergies    ?Sulfa antibiotics, Prednisone, Nsaids, and Other   ? ?Review of Systems   ?Review of Systems  ?Constitutional:  Negative for chills, diaphoresis, fatigue and fever.  ?HENT:  Negative for congestion, rhinorrhea and sneezing.   ?Eyes: Negative.   ?Respiratory:  Positive for shortness of breath. Negative for cough and chest tightness.   ?Cardiovascular:  Positive for leg swelling. Negative for chest pain.   ?Gastrointestinal:  Positive for abdominal distention. Negative for abdominal pain, blood in stool, diarrhea, nausea and vomiting.  ?Genitourinary:  Negative for difficulty urinating, flank pain, frequency and hematuria.  ?Musculoskeletal:  Negative for arthralgias and back pain.  ?Skin:  Negative for rash.  ?Neurological:  Negative for dizziness, speech difficulty, weakness, numbness and headaches.  ? ?Physical Exam ?Updated Vital Signs ?BP (!) 103/51   Pulse 70   Temp 98.8 ?F (37.1 ?C) (Oral)   Resp 15   SpO2 99%  ?Physical Exam ?Constitutional:   ?   Appearance: She is well-developed.  ?HENT:  ?   Head: Normocephalic and atraumatic.  ?Eyes:  ?   Pupils: Pupils are equal, round, and reactive to light.  ?Cardiovascular:  ?   Rate and Rhythm: Normal rate and regular rhythm.  ?   Heart sounds: Normal heart sounds.  ?Pulmonary:  ?   Effort: Pulmonary effort is normal. No respiratory distress.  ?   Breath sounds: Normal breath sounds. No wheezing or rales.  ?Chest:  ?   Chest wall: No tenderness.  ?Abdominal:  ?   General: Bowel sounds are normal. There is distension.  ?   Palpations: Abdomen is soft.  ?   Tenderness: There is no abdominal tenderness. There is no guarding or rebound.  ?   Comments: Large distended abdomen, its mildly erythematous, no abdominal tenderness  ?Musculoskeletal:     ?   General: Normal range of motion.  ?   Cervical back: Normal range of motion and neck supple.  ?   Comments: 3-4+ pitting edema to lower extremities bilaterally, the left is slightly more increased than the right, pedal pulses are intact, no warmth or erythema  ?Lymphadenopathy:  ?   Cervical: No cervical adenopathy.  ?Skin: ?   General: Skin is warm and dry.  ?   Findings: No rash.  ?Neurological:  ?   Mental Status: She is alert and oriented to person, place, and time.  ? ? ?ED Results / Procedures / Treatments   ?Labs ?(all labs ordered are listed, but only abnormal results are displayed) ?Labs Reviewed  ?COMPREHENSIVE  METABOLIC PANEL - Abnormal; Notable for the following components:  ?    Result Value  ? Potassium 3.3 (*)   ? Glucose, Bld 106 (*)   ? Calcium 8.3 (*)   ? Total Protein 5.9 (*)   ? Albumin 2.7 (*)   ? Alkaline Phosphatase 140 (*)   ? Total Bilirubin 1.4 (*)   ? All other components within normal limits  ?CBC WITH DIFFERENTIAL/PLATELET - Abnormal; Notable for the following components:  ? RBC 3.22 (*)   ? Hemoglobin 10.8 (*)   ? HCT 32.6 (*)   ? MCV 101.2 (*)   ? RDW  16.0 (*)   ? Platelets 70 (*)   ? Lymphs Abs 0.6 (*)   ? All other components within normal limits  ?HEPARIN LEVEL (UNFRACTIONATED)  ?CBC  ?PROTIME-INR  ?COMPREHENSIVE METABOLIC PANEL  ?LACTATE DEHYDROGENASE  ?PROTIME-INR  ?MAGNESIUM  ?I-STAT CHEM 8, ED  ? ? ?EKG ?EKG Interpretation ? ?Date/Time:  Tuesday April 27 2021 16:57:11 EDT ?Ventricular Rate:  73 ?PR Interval:  174 ?QRS Duration: 87 ?QT Interval:  405 ?QTC Calculation: 447 ?R Axis:   21 ?Text Interpretation: Sinus rhythm Low voltage, precordial leads Abnormal R-wave progression, early transition since last tracing no significant change Confirmed by Malvin Johns [phone]) on 04/27/2021 5:45:20 PM ? ?Radiology ?CT Abdomen Pelvis Wo Contrast ? ?Result Date: 04/27/2021 ?CLINICAL DATA:  Increasing ascites and lower extremity swelling. EXAM: CT ABDOMEN AND PELVIS WITHOUT CONTRAST TECHNIQUE: Multidetector CT imaging of the abdomen and pelvis was performed following the standard protocol without IV contrast. RADIATION DOSE REDUCTION: This exam was performed according to the departmental dose-optimization program which includes automated exposure control, adjustment of the mA and/or kV according to patient size and/or use of iterative reconstruction technique. COMPARISON:  Contrast-enhanced CT 04/06/2021 FINDINGS: Lower chest: Normal heart size with coronary artery calcifications. No pleural effusion. Hepatobiliary: Hepatic cirrhosis with shrunken liver and nodular contours. No focal liver lesion on this  unenhanced exam. Cholecystectomy with stable biliary tree. Pancreas: No ductal dilatation or inflammation. Spleen: Mildly enlarged spanning 14.9 x 12.7 x 5.5 cm (volume = 540 cm^3). No focal abnormality on this unenhanced

## 2021-04-27 NOTE — Progress Notes (Signed)
Left lower extremity venous duplex completed. ?Refer to "CV Proc" under chart review to view preliminary results. ? ?Preliminary results discussed with Dr. Tamera Punt. ? ?04/27/2021 6:49 PM ?Kelby Aline., MHA, RVT, RDCS, RDMS   ?

## 2021-04-27 NOTE — Telephone Encounter (Signed)
Diannia Ruder NP from [facility] called stating that patient is needing to have a TAP done and is requesting advice on what she needs to do to have it done since she is a at home patient. Please advise.  ?

## 2021-04-27 NOTE — Telephone Encounter (Signed)
Returned [name]'s call to inquire further. States pt has gained greater than 3 pounds within the past 24 hrs, has 4+ pitting edema to lower extremities, c/o dyspnea and discomfort to her abd. Girth of abd has also increased in size per Home Health staff. Pt does not want to return to the ED for treatment, further adding she lives in [place]. Advised this is not something that we could manage on an OP basis and would be most appropriately managed in a hosp setting. Advised [name] that the pt will need to return to the ED for evaluation and treatment of ascites. Verbalized acceptance and understanding. Routing this message to Dr. Lorenso Courier for continuity of care purposes. ?

## 2021-04-28 ENCOUNTER — Observation Stay (HOSPITAL_COMMUNITY): Payer: Medicare HMO

## 2021-04-28 DIAGNOSIS — E2740 Unspecified adrenocortical insufficiency: Secondary | ICD-10-CM | POA: Diagnosis present

## 2021-04-28 DIAGNOSIS — I129 Hypertensive chronic kidney disease with stage 1 through stage 4 chronic kidney disease, or unspecified chronic kidney disease: Secondary | ICD-10-CM | POA: Diagnosis present

## 2021-04-28 DIAGNOSIS — K7040 Alcoholic hepatic failure without coma: Secondary | ICD-10-CM | POA: Diagnosis present

## 2021-04-28 DIAGNOSIS — D696 Thrombocytopenia, unspecified: Secondary | ICD-10-CM | POA: Diagnosis present

## 2021-04-28 DIAGNOSIS — F17210 Nicotine dependence, cigarettes, uncomplicated: Secondary | ICD-10-CM | POA: Diagnosis present

## 2021-04-28 DIAGNOSIS — Z96651 Presence of right artificial knee joint: Secondary | ICD-10-CM | POA: Diagnosis present

## 2021-04-28 DIAGNOSIS — Z888 Allergy status to other drugs, medicaments and biological substances status: Secondary | ICD-10-CM | POA: Diagnosis not present

## 2021-04-28 DIAGNOSIS — Z833 Family history of diabetes mellitus: Secondary | ICD-10-CM | POA: Diagnosis not present

## 2021-04-28 DIAGNOSIS — Z6841 Body Mass Index (BMI) 40.0 and over, adult: Secondary | ICD-10-CM | POA: Diagnosis not present

## 2021-04-28 DIAGNOSIS — R54 Age-related physical debility: Secondary | ICD-10-CM | POA: Diagnosis not present

## 2021-04-28 DIAGNOSIS — I82432 Acute embolism and thrombosis of left popliteal vein: Secondary | ICD-10-CM | POA: Diagnosis present

## 2021-04-28 DIAGNOSIS — I509 Heart failure, unspecified: Secondary | ICD-10-CM | POA: Diagnosis not present

## 2021-04-28 DIAGNOSIS — K429 Umbilical hernia without obstruction or gangrene: Secondary | ICD-10-CM | POA: Diagnosis present

## 2021-04-28 DIAGNOSIS — F419 Anxiety disorder, unspecified: Secondary | ICD-10-CM | POA: Diagnosis present

## 2021-04-28 DIAGNOSIS — G40909 Epilepsy, unspecified, not intractable, without status epilepticus: Secondary | ICD-10-CM | POA: Diagnosis not present

## 2021-04-28 DIAGNOSIS — Z882 Allergy status to sulfonamides status: Secondary | ICD-10-CM | POA: Diagnosis not present

## 2021-04-28 DIAGNOSIS — F339 Major depressive disorder, recurrent, unspecified: Secondary | ICD-10-CM | POA: Diagnosis not present

## 2021-04-28 DIAGNOSIS — I10 Essential (primary) hypertension: Secondary | ICD-10-CM | POA: Diagnosis not present

## 2021-04-28 DIAGNOSIS — K7011 Alcoholic hepatitis with ascites: Secondary | ICD-10-CM | POA: Diagnosis present

## 2021-04-28 DIAGNOSIS — I110 Hypertensive heart disease with heart failure: Secondary | ICD-10-CM | POA: Diagnosis not present

## 2021-04-28 DIAGNOSIS — M1990 Unspecified osteoarthritis, unspecified site: Secondary | ICD-10-CM | POA: Diagnosis present

## 2021-04-28 DIAGNOSIS — E876 Hypokalemia: Secondary | ICD-10-CM | POA: Diagnosis present

## 2021-04-28 DIAGNOSIS — N182 Chronic kidney disease, stage 2 (mild): Secondary | ICD-10-CM | POA: Diagnosis present

## 2021-04-28 DIAGNOSIS — N183 Chronic kidney disease, stage 3 unspecified: Secondary | ICD-10-CM | POA: Diagnosis not present

## 2021-04-28 DIAGNOSIS — E8770 Fluid overload, unspecified: Secondary | ICD-10-CM | POA: Diagnosis present

## 2021-04-28 DIAGNOSIS — F32A Depression, unspecified: Secondary | ICD-10-CM | POA: Diagnosis not present

## 2021-04-28 DIAGNOSIS — R188 Other ascites: Secondary | ICD-10-CM | POA: Diagnosis not present

## 2021-04-28 DIAGNOSIS — Z886 Allergy status to analgesic agent status: Secondary | ICD-10-CM | POA: Diagnosis not present

## 2021-04-28 DIAGNOSIS — K7031 Alcoholic cirrhosis of liver with ascites: Secondary | ICD-10-CM | POA: Diagnosis present

## 2021-04-28 DIAGNOSIS — Z79899 Other long term (current) drug therapy: Secondary | ICD-10-CM | POA: Diagnosis not present

## 2021-04-28 HISTORY — PX: IR PARACENTESIS: IMG2679

## 2021-04-28 LAB — COMPREHENSIVE METABOLIC PANEL
ALT: 30 U/L (ref 0–44)
AST: 37 U/L (ref 15–41)
Albumin: 2.4 g/dL — ABNORMAL LOW (ref 3.5–5.0)
Alkaline Phosphatase: 102 U/L (ref 38–126)
Anion gap: 7 (ref 5–15)
BUN: 19 mg/dL (ref 8–23)
CO2: 27 mmol/L (ref 22–32)
Calcium: 8 mg/dL — ABNORMAL LOW (ref 8.9–10.3)
Chloride: 101 mmol/L (ref 98–111)
Creatinine, Ser: 1.06 mg/dL — ABNORMAL HIGH (ref 0.44–1.00)
GFR, Estimated: 58 mL/min — ABNORMAL LOW (ref >60)
Glucose, Bld: 106 mg/dL — ABNORMAL HIGH (ref 70–99)
Potassium: 3 mmol/L — ABNORMAL LOW (ref 3.5–5.1)
Sodium: 135 mmol/L (ref 135–145)
Total Bilirubin: 1.8 mg/dL — ABNORMAL HIGH (ref 0.3–1.2)
Total Protein: 5.5 g/dL — ABNORMAL LOW (ref 6.5–8.1)

## 2021-04-28 LAB — CBC
HCT: 31.5 % — ABNORMAL LOW (ref 36.0–46.0)
Hemoglobin: 10.5 g/dL — ABNORMAL LOW (ref 12.0–15.0)
MCH: 34 pg (ref 26.0–34.0)
MCHC: 33.3 g/dL (ref 30.0–36.0)
MCV: 101.9 fL — ABNORMAL HIGH (ref 80.0–100.0)
Platelets: 65 10*3/uL — ABNORMAL LOW (ref 150–400)
RBC: 3.09 MIL/uL — ABNORMAL LOW (ref 3.87–5.11)
RDW: 16.2 % — ABNORMAL HIGH (ref 11.5–15.5)
WBC: 7.7 10*3/uL (ref 4.0–10.5)
nRBC: 0 % (ref 0.0–0.2)

## 2021-04-28 LAB — GRAM STAIN

## 2021-04-28 LAB — ALBUMIN, PLEURAL OR PERITONEAL FLUID: Albumin, Fluid: <1.5 g/dL

## 2021-04-28 LAB — HEPARIN LEVEL (UNFRACTIONATED)
Heparin Unfractionated: 0.65 IU/mL (ref 0.30–0.70)
Heparin Unfractionated: 0.92 IU/mL — ABNORMAL HIGH (ref 0.30–0.70)
Heparin Unfractionated: 0.84 IU/mL — ABNORMAL HIGH (ref 0.30–0.70)

## 2021-04-28 LAB — PROTIME-INR
INR: 1.3 — ABNORMAL HIGH (ref 0.8–1.2)
Prothrombin Time: 16.1 seconds — ABNORMAL HIGH (ref 11.4–15.2)

## 2021-04-28 LAB — LACTATE DEHYDROGENASE, PLEURAL OR PERITONEAL FLUID: LD, Fluid: 29 U/L — ABNORMAL HIGH (ref 3–23)

## 2021-04-28 LAB — LACTATE DEHYDROGENASE: LDH: 177 U/L (ref 98–192)

## 2021-04-28 MED ORDER — LIDOCAINE HCL 1 % IJ SOLN
INTRAMUSCULAR | Status: AC
Start: 2021-04-28 — End: 2021-04-28
  Filled 2021-04-28: qty 20

## 2021-04-28 MED ORDER — POTASSIUM CHLORIDE CRYS ER 20 MEQ PO TBCR
40.0000 meq | EXTENDED_RELEASE_TABLET | Freq: Once | ORAL | Status: AC
  Administered 2021-04-28: 40 meq via ORAL
  Filled 2021-04-28: qty 2

## 2021-04-28 MED ORDER — SPIRONOLACTONE 25 MG PO TABS
50.0000 mg | ORAL_TABLET | Freq: Every day | ORAL | Status: DC
Start: 2021-04-28 — End: 2021-05-01
  Administered 2021-04-28 – 2021-05-01 (×4): 50 mg via ORAL
  Filled 2021-04-28 (×4): qty 2

## 2021-04-28 MED ORDER — POTASSIUM CHLORIDE CRYS ER 20 MEQ PO TBCR
40.0000 meq | EXTENDED_RELEASE_TABLET | Freq: Every day | ORAL | Status: DC
  Administered 2021-04-29 – 2021-05-01 (×3): 40 meq via ORAL
  Filled 2021-04-28 (×3): qty 2

## 2021-04-28 MED ORDER — HYDROCORTISONE 20 MG PO TABS
20.0000 mg | ORAL_TABLET | Freq: Two times a day (BID) | ORAL | Status: DC
  Administered 2021-04-28 – 2021-05-01 (×7): 20 mg via ORAL
  Filled 2021-04-28 (×8): qty 1

## 2021-04-28 MED ORDER — FUROSEMIDE 40 MG PO TABS
40.0000 mg | ORAL_TABLET | Freq: Every day | ORAL | Status: DC
  Administered 2021-04-28 – 2021-04-29 (×2): 40 mg via ORAL
  Filled 2021-04-28: qty 1

## 2021-04-28 MED ORDER — ALPRAZOLAM 0.5 MG PO TABS
0.5000 mg | ORAL_TABLET | Freq: Three times a day (TID) | ORAL | Status: DC | PRN
  Administered 2021-04-28 – 2021-05-01 (×6): 0.5 mg via ORAL
  Filled 2021-04-28 (×7): qty 1

## 2021-04-28 MED ORDER — PANTOPRAZOLE SODIUM 40 MG PO TBEC
40.0000 mg | DELAYED_RELEASE_TABLET | Freq: Every day | ORAL | Status: DC
  Administered 2021-04-28 – 2021-05-01 (×4): 40 mg via ORAL
  Filled 2021-04-28 (×3): qty 1

## 2021-04-28 NOTE — Progress Notes (Signed)
ANTICOAGULATION CONSULT NOTE  ?Pharmacy Consult for heparin ?Indication: DVT ?Brief A/P: Heparin level within goal range Continue Heparin at current rate  ? ?Allergies  ?Allergen Reactions  ? Sulfa Antibiotics Anaphylaxis  ? Prednisone Other (See Comments)  ?  hallucinations  ? Nsaids Other (See Comments)  ?  Stomach pain, diarrhea  ? Other Other (See Comments)  ?  Steroids: hallucinations  ? ? ?Patient Measurements: ?  ?Heparin Dosing Weight: 109 kg  ? ?Vital Signs: ?Temp: 98.8 ?F (37.1 ?C) (04/25 1657) ?Temp Source: Oral (04/25 1657) ?BP: 101/56 (04/26 0330) ?Pulse Rate: 68 (04/26 0330) ? ?Labs: ?Recent Labs  ?  04/27/21 ?1716 04/27/21 ?2230 04/28/21 ?0355  ?HGB 10.8*  --   --   ?HCT 32.6*  --   --   ?PLT 70*  --   --   ?LABPROT  --  16.5* 16.1*  ?INR  --  1.4* 1.3*  ?HEPARINUNFRC  --   --  0.65  ?CREATININE 1.00  --  1.06*  ? ? ? ?CrCl cannot be calculated (Unknown ideal weight.). ? ?Assessment: ?66 y.o. female with DVT for heparin ? ?Goal of Therapy:  ?Heparin level 0.3-0.7 units/mL ?Monitor platelets by anticoagulation protocol: Yes ?  ?Plan:  ?Continue Heparin at current rate  ? ?Carla Leonard, PharmD, BCPS ? ? ? ? ?

## 2021-04-28 NOTE — Progress Notes (Signed)
PT Cancellation Note ? ?Patient Details ?Name: Carla Leonard ?MRN: [id number] ?DOB: May 02, 1955 ? ? ?Cancelled Treatment:    Reason Eval/Treat Not Completed: Medical issues which prohibited therapy Pt with new DVT and per protocol, must be on heparin 24 hours prior to initiation of therapy. Will follow up as schedule allows.  ? ?Reuel Derby, PT, DPT  ?Acute Rehabilitation Services  ?Pager: [phone] ?Office: [phone] ? ? ? ?[place] ?04/28/2021, 8:43 AM ?

## 2021-04-28 NOTE — Procedures (Signed)
PROCEDURE SUMMARY: ? ?Successful US guided paracentesis from left lateral abdomen.  ?Yielded 2.4 liters of clear yellow fluid.  ?No immediate complications.  ?Patient tolerated well.  ?EBL = trace ? ?Specimen was sent for labs. ? ?Murrell Redden PA-C ?04/28/2021 ?12:59 PM ? ? ? ?

## 2021-04-28 NOTE — Progress Notes (Signed)
OT Cancellation Note ? ?Patient Details ?Name: Carla Leonard ?MRN: [id number] ?DOB: [birth year] ? ? ?Cancelled Treatment:    Reason Eval/Treat Not Completed: Patient not medically ready- pt with acute DVT, heparin initiated on 4/25 at [facility] per protocol will wait 24hrs prior to OT evaluation.   ? ?Jolaine Artist, OT ?Acute Rehabilitation Services ?Pager [phone] ?Office [phone] ?  ? ?Delight Stare ?04/28/2021, 8:26 AM ?

## 2021-04-28 NOTE — Progress Notes (Addendum)
ANTICOAGULATION CONSULT NOTE  ?Pharmacy Consult for heparin ?Indication: DVT ?Brief A/P: Heparin level within goal range Continue Heparin at current rate  ? ?Allergies  ?Allergen Reactions  ? Sulfa Antibiotics Anaphylaxis  ? Prednisone Other (See Comments)  ?  hallucinations  ? Nsaids Other (See Comments)  ?  Stomach pain, diarrhea  ? Other Other (See Comments)  ?  Steroids: hallucinations  ? ? ?Patient Measurements: ?Weight: 115.4 kg (254 lb 6.6 oz) ?Heparin Dosing Weight: 109 kg  ? ?Vital Signs: ?Temp: 98.4 ?F (36.9 ?C) (04/26 2039) ?Temp Source: Oral (04/26 2039) ?BP: 112/74 (04/26 2039) ?Pulse Rate: 73 (04/26 2039) ? ?Labs: ?Recent Labs  ?  04/27/21 ?1716 04/27/21 ?2230 04/28/21 ?0355 04/28/21 ?1321 04/28/21 ?2149  ?HGB 10.8*  --  10.5*  --   --   ?HCT 32.6*  --  31.5*  --   --   ?PLT 70*  --  65*  --   --   ?LABPROT  --  16.5* 16.1*  --   --   ?INR  --  1.4* 1.3*  --   --   ?HEPARINUNFRC  --   --  0.65 0.92* 0.84*  ?CREATININE 1.00  --  1.06*  --   --   ? ? ?Estimated Creatinine Clearance: 63.1 mL/min (A) (by C-G formula based on SCr of 1.06 mg/dL (H)). ? ?Assessment: ?73 YOF with increased ascites found to have an acute left sided DVT of popliteal vein. Pharmacy consulted to start IV heparin.  ?  ?Currently on IV heparin at 1800 units/hr. Repeat HL is now elevated. H/H low. Plt remain low at 65k. No s/s of overt bleeding noted.  ?  ? ?Repeat Heparin level is high at 0.84 after decrease to 1600 units/hr. Patient had line pull out with some bleeding that was longer to stop but no overt bleeding noted per RN.  ? ?Goal of Therapy:  ?Heparin level 0.3-0.7 units/mL ?Monitor platelets by anticoagulation protocol: Yes ?  ?Plan:  ?Decrease heparin infusion further to 1400 units/hr.  ?F/u 6 hr HL  ? ?Sloan Leiter, PharmD ?Clinical Pharmacist ?Please refer to [facility] for [place] numbers ?04/28/2021, 11:08 PM ? ? ? ? ? ?

## 2021-04-28 NOTE — Progress Notes (Signed)
ANTICOAGULATION CONSULT NOTE  ?Pharmacy Consult for heparin ?Indication: DVT ?Brief A/P: Heparin level within goal range Continue Heparin at current rate  ? ?Allergies  ?Allergen Reactions  ? Sulfa Antibiotics Anaphylaxis  ? Prednisone Other (See Comments)  ?  hallucinations  ? Nsaids Other (See Comments)  ?  Stomach pain, diarrhea  ? Other Other (See Comments)  ?  Steroids: hallucinations  ? ? ?Patient Measurements: ?  ?Heparin Dosing Weight: 109 kg  ? ?Vital Signs: ?BP: 110/37 (04/26 1115) ?Pulse Rate: 70 (04/26 1115) ? ?Labs: ?Recent Labs  ?  04/27/21 ?1716 04/27/21 ?2230 04/28/21 ?0355 04/28/21 ?1321  ?HGB 10.8*  --  10.5*  --   ?HCT 32.6*  --  31.5*  --   ?PLT 70*  --  65*  --   ?LABPROT  --  16.5* 16.1*  --   ?INR  --  1.4* 1.3*  --   ?HEPARINUNFRC  --   --  0.65 0.92*  ?CREATININE 1.00  --  1.06*  --   ? ? ? ?CrCl cannot be calculated (Unknown ideal weight.). ? ?Assessment: ?21 YOF with increased ascites found to have an acute left sided DVT of popliteal vein. Pharmacy consulted to start IV heparin.  ?  ?Currently on IV heparin at 1800 units/hr. Repeat HL is now elevated. H/H low. Plt remain low at 65k. No s/s of overt bleeding noted.  ?  ? ?Goal of Therapy:  ?Heparin level 0.3-0.7 units/mL ?Monitor platelets by anticoagulation protocol: Yes ?  ?Plan:  ?Decrease heparin infusion at 1600 units/hr.  ?F/u 6 hr HL  ? ?Albertina Parr, PharmD., BCCCP ?Clinical Pharmacist ?Please refer to [facility] for unit-specific pharmacist  ? ? ? ? ?

## 2021-04-28 NOTE — ED Notes (Signed)
Transported to IR 

## 2021-04-28 NOTE — ED Notes (Signed)
Breakfast order placed ?

## 2021-04-28 NOTE — Progress Notes (Signed)
?                                  PROGRESS NOTE                                             ?                                                                                                                     ?                                         ? ? Patient Demographics:  ? ? Carla Leonard, is a 66 y.o. female, DOB - Dec 12, 1955, ZOX:[id number] ? ?Outpatient Primary MD for the patient is Bonnita Nasuti, MD    LOS - 0  Admit date - 04/27/2021   ? ?Chief Complaint  ?Patient presents with  ? Ascites  ?    ? ?Brief Narrative (HPI from H&P)   66 y.o. female with medical history significant of cirrhosis, adrenal insufficiency, CKD 3, anxiety, depression, hypertension, seizures presenting with ascites and edema.  She presented to the hospital with abdominal distention, also some swelling in her lower legs more in the left lower extremity, in the ER she was diagnosed with worsening ascites and fluid overload along with acute left lower extremity DVT in the setting of thrombocytopenia. ? ? Subjective:  ? ? Enzo Bi today has, No headache, No chest pain, No abdominal pain - No Nausea, No new weakness tingling or numbness, no SOB. ? ? Assessment  & Plan :  ? ?1.  Acute left lower extremity DVT in the setting of thrombocytopenia.  For now heparin drip with caution, monitor for any signs of bleeding or worsening thrombocytopenia, if she cannot tolerate anticoagulation then IVC filter.  Monitor closely. ? ?2.  History of alcoholic cirrhosis with portal hypertension, ascites and lower extremity edema.  Ultrasound-guided paracentesis ordered, no signs of SBP, no signs of bleeding, follow paracentesis studies ordered upon admission, diuretic dose adjusted.  Will monitor.  Outpatient GI follow-up postdischarge. ? ?3.  History of periumbilical hernia.  Outpatient surgery follow-up which is still pending on last admission. ? ?4.  History of adrenal insufficiency.  Continue hydrocortisone dose  adjusted, outpatient endocrine follow-up still pending from last admission, request PCP to assist the patient. ? ?5.  Anxiety and depression.  On trazodone and Xanax.  Monitor. ? ?6.  History of seizure disorder.  Continue Keppra and primidone. ? ?7.  Hypokalemia.  Replaced. ? ?8.  History of macrocytic anemia.  Outpatient follow-up with PCP. ? ?9.  Morbid Obesity with BMI of 44.  Follow with PCP for weight loss. ? ?10.  Chronic cirrhosis induced thrombocytopenia.  Monitor with anticoagulation closely ? ?Lab Results  ?Component Value Date  ? PLT 65 (L) 04/28/2021  ? ? ? ?   ? ?Condition - Fair ? ?Family Communication  :  None present ? ?Code Status :  Full ? ?Consults  :  IR ? ?PUD Prophylaxis : PPI ? ? Procedures  :    ? ?Leg [place] -  RIGHT: - No evidence of common femoral vein obstruction.  LEFT: - Findings consistent with acute deep vein thrombosis involving the left popliteal vein. - A cystic structure is found in the popliteal fossa. ? ?CT - 1. Large volume abdominopelvic ascites, increased from earlier this month. 2. Hepatic cirrhosis with mild splenomegaly. 3. Moderate-sized fat containing umbilical hernia. 4. Subcutaneous edema of the lower anterior abdominal wall, asymmetric to the left. Recommend correlation for cellulitis. Aortic Atherosclerosis (ICD10-I70.0).  ? ?   ? ?Disposition Plan  :   ? ?Status is: Observation ? ?DVT Prophylaxis  :  Hep gtt ? ?Lab Results  ?Component Value Date  ? PLT 65 (L) 04/28/2021  ? ? ?Diet :  ?Diet Order   ? ?       ?  Diet Heart Room service appropriate? Yes; Fluid consistency: Thin; Fluid restriction: 1200 mL Fluid  Diet effective now       ?  ? ?  ?  ? ?  ?  ? ?Inpatient Medications ? ?Scheduled Meds: ? furosemide  40 mg Oral Daily  ? hydrocortisone  20 mg Oral BID  ? levETIRAcetam  500 mg Oral BID  ? midodrine  10 mg Oral TID WC  ? pantoprazole  40 mg Oral Daily  ? potassium chloride  40 mEq Oral Once  ? [START ON 04/29/2021] potassium chloride  40 mEq Oral Daily  ?  primidone  100 mg Oral q morning  ? sodium chloride flush  3 mL Intravenous Q12H  ? spironolactone  50 mg Oral Daily  ? ?Continuous Infusions: ? heparin 1,800 Units/hr (04/28/21 5329)  ? ?PRN Meds:.acetaminophen **OR** acetaminophen, ALPRAZolam, polyethylene glycol, traMADol, traZODone ? ?Antibiotics  :   ? ?Anti-infectives (From admission, onward)  ? ? None  ? ?  ? ? ? Time Spent in minutes  30 ? ? ?Lala Lund M.D on 04/28/2021 at 8:53 AM ? ?To page go to [email or web address]  ? ?[facility] -  Office  [phone] ? ?See all Orders from today for further details ? ? ? Objective:  ? ?Vitals:  ? 04/28/21 6222 04/28/21 0645 04/28/21 0700 04/28/21 0715  ?BP: (!) 115/54  (!) 102/50   ?Pulse: 84 81 75 67  ?Resp: '17 16 17 14  '$ ?Temp:      ?TempSrc:      ?SpO2: 100% 100% 100% 100%  ? ? ?Wt Readings from Last 3 Encounters:  ?04/12/21 109 kg  ?03/29/21 106.2 kg  ?02/15/21 125.1 kg  ? ? ? ?Intake/Output Summary (Last 24 hours) at 04/28/2021 0853 ?Last data filed at 04/28/2021 0605 ?Gross per 24 hour  ?Intake 212.33 ml  ?Output --  ?Net 212.33 ml  ? ? ? ?Physical Exam ? ?Awake Alert, No new F.N deficits, Normal affect ?[place].AT,PERRAL ?Supple Neck, No JVD,   ?Symmetrical Chest wall movement, Good air movement bilaterally, CTAB ?RRR,No Gallops,Rubs or new Murmurs,  ?+ve B.Sounds, Abd Soft but distended, No tenderness,   ?No Cyanosis, 1+ edema L.Leg > R. Leg  ?  ? ? Data Review:  ? ? ?CBC ?Recent Labs  ?Lab  04/27/21 ?1716 04/28/21 ?0355  ?WBC 7.8 7.7  ?HGB 10.8* 10.5*  ?HCT 32.6* 31.5*  ?PLT 70* 65*  ?MCV 101.2* 101.9*  ?MCH 33.5 34.0  ?MCHC 33.1 33.3  ?RDW 16.0* 16.2*  ?LYMPHSABS 0.6*  --   ?MONOABS 0.6  --   ?EOSABS 0.1  --   ?BASOSABS 0.0  --   ? ? ?Electrolytes ?Recent Labs  ?Lab 04/27/21 ?1716 04/27/21 ?2230 04/28/21 ?0355  ?NA 136  --  135  ?K 3.3*  --  3.0*  ?CL 103  --  101  ?CO2 27  --  27  ?GLUCOSE 106*  --  106*  ?BUN 19  --  19  ?CREATININE 1.00  --  1.06*  ?CALCIUM 8.3*  --  8.0*  ?AST 41  --  37  ?ALT 32  --  30   ?ALKPHOS 140*  --  102  ?BILITOT 1.4*  --  1.8*  ?ALBUMIN 2.7*  --  2.4*  ?MG  --  2.0  --   ?INR  --  1.4* 1.3*  ? ? ?------------------------------------------------------------------------------------------------------------------ ?No results for input(s): CHOL, HDL, LDLCALC, TRIG, CHOLHDL, LDLDIRECT in the last 72 hours. ? ?No results found for: HGBA1C ? ?No results for input(s): TSH, T4TOTAL, T3FREE, THYROIDAB in the last 72 hours. ? ?Invalid input(s): FREET3 ?------------------------------------------------------------------------------------------------------------------ ?ID Labs ?Recent Labs  ?Lab 04/27/21 ?1716 04/28/21 ?3329  ?WBC 7.8 7.7  ?PLT 70* 65*  ?CREATININE 1.00 1.06*  ? ?Cardiac Enzymes ?No results for input(s): CKMB, TROPONINI, MYOGLOBIN in the last 168 hours. ? ?Invalid input(s): CK ? ? ?  ? ? ?Micro Results ?No results found for this or any previous visit (from the past 240 hour(s)). ? ?Radiology Reports ?CT Abdomen Pelvis Wo Contrast ? ?Result Date: 04/27/2021 ?CLINICAL DATA:  Increasing ascites and lower extremity swelling. EXAM: CT ABDOMEN AND PELVIS WITHOUT CONTRAST TECHNIQUE: Multidetector CT imaging of the abdomen and pelvis was performed following the standard protocol without IV contrast. RADIATION DOSE REDUCTION: This exam was performed according to the departmental dose-optimization program which includes automated exposure control, adjustment of the mA and/or kV according to patient size and/or use of iterative reconstruction technique. COMPARISON:  Contrast-enhanced CT 04/06/2021 FINDINGS: Lower chest: Normal heart size with coronary artery calcifications. No pleural effusion. Hepatobiliary: Hepatic cirrhosis with shrunken liver and nodular contours. No focal liver lesion on this unenhanced exam. Cholecystectomy with stable biliary tree. Pancreas: No ductal dilatation or inflammation. Spleen: Mildly enlarged spanning 14.9 x 12.7 x 5.5 cm (volume = 540 cm^3). No focal abnormality on  this unenhanced exam. Adrenals/Urinary Tract: No adrenal nodule. No hydronephrosis. Calcification in the left renal hilum felt to be vascular. The small hypodensities in the kidneys on prior exam are not well see

## 2021-04-29 ENCOUNTER — Other Ambulatory Visit (HOSPITAL_COMMUNITY): Payer: Self-pay

## 2021-04-29 DIAGNOSIS — K766 Portal hypertension: Secondary | ICD-10-CM | POA: Diagnosis not present

## 2021-04-29 DIAGNOSIS — E8770 Fluid overload, unspecified: Secondary | ICD-10-CM | POA: Diagnosis not present

## 2021-04-29 DIAGNOSIS — K7460 Unspecified cirrhosis of liver: Secondary | ICD-10-CM | POA: Diagnosis not present

## 2021-04-29 DIAGNOSIS — I82432 Acute embolism and thrombosis of left popliteal vein: Secondary | ICD-10-CM | POA: Diagnosis not present

## 2021-04-29 DIAGNOSIS — F419 Anxiety disorder, unspecified: Secondary | ICD-10-CM | POA: Diagnosis not present

## 2021-04-29 DIAGNOSIS — Z9049 Acquired absence of other specified parts of digestive tract: Secondary | ICD-10-CM | POA: Diagnosis not present

## 2021-04-29 DIAGNOSIS — K7011 Alcoholic hepatitis with ascites: Secondary | ICD-10-CM | POA: Diagnosis not present

## 2021-04-29 LAB — COMPREHENSIVE METABOLIC PANEL
ALT: 29 U/L (ref 0–44)
AST: 34 U/L (ref 15–41)
Albumin: 2.4 g/dL — ABNORMAL LOW (ref 3.5–5.0)
Alkaline Phosphatase: 106 U/L (ref 38–126)
Anion gap: 7 (ref 5–15)
BUN: 20 mg/dL (ref 8–23)
CO2: 28 mmol/L (ref 22–32)
Calcium: 8.1 mg/dL — ABNORMAL LOW (ref 8.9–10.3)
Chloride: 102 mmol/L (ref 98–111)
Creatinine, Ser: 1.08 mg/dL — ABNORMAL HIGH (ref 0.44–1.00)
GFR, Estimated: 57 mL/min — ABNORMAL LOW (ref >60)
Glucose, Bld: 96 mg/dL (ref 70–99)
Potassium: 3.6 mmol/L (ref 3.5–5.1)
Sodium: 137 mmol/L (ref 135–145)
Total Bilirubin: 1.7 mg/dL — ABNORMAL HIGH (ref 0.3–1.2)
Total Protein: 5.3 g/dL — ABNORMAL LOW (ref 6.5–8.1)

## 2021-04-29 LAB — CBC WITH DIFFERENTIAL/PLATELET
Abs Immature Granulocytes: 0.03 10*3/uL (ref 0.00–0.07)
Basophils Absolute: 0 10*3/uL (ref 0.0–0.1)
Basophils Relative: 1 %
Eosinophils Absolute: 0.2 10*3/uL (ref 0.0–0.5)
Eosinophils Relative: 2 %
HCT: 30.4 % — ABNORMAL LOW (ref 36.0–46.0)
Hemoglobin: 10.1 g/dL — ABNORMAL LOW (ref 12.0–15.0)
Immature Granulocytes: 1 %
Lymphocytes Relative: 9 %
Lymphs Abs: 0.6 10*3/uL — ABNORMAL LOW (ref 0.7–4.0)
MCH: 33.4 pg (ref 26.0–34.0)
MCHC: 33.2 g/dL (ref 30.0–36.0)
MCV: 100.7 fL — ABNORMAL HIGH (ref 80.0–100.0)
Monocytes Absolute: 0.7 10*3/uL (ref 0.1–1.0)
Monocytes Relative: 10 %
Neutro Abs: 5.2 10*3/uL (ref 1.7–7.7)
Neutrophils Relative %: 77 %
Platelets: 69 10*3/uL — ABNORMAL LOW (ref 150–400)
RBC: 3.02 MIL/uL — ABNORMAL LOW (ref 3.87–5.11)
RDW: 15.9 % — ABNORMAL HIGH (ref 11.5–15.5)
WBC: 6.6 10*3/uL (ref 4.0–10.5)
nRBC: 0 % (ref 0.0–0.2)

## 2021-04-29 LAB — C-REACTIVE PROTEIN: CRP: 2.7 mg/dL — ABNORMAL HIGH (ref <1.0)

## 2021-04-29 LAB — HEPARIN LEVEL (UNFRACTIONATED)
Heparin Unfractionated: 0.54 IU/mL (ref 0.30–0.70)
Heparin Unfractionated: 0.61 IU/mL (ref 0.30–0.70)

## 2021-04-29 LAB — MAGNESIUM: Magnesium: 1.9 mg/dL (ref 1.7–2.4)

## 2021-04-29 MED ORDER — WARFARIN SODIUM 7.5 MG PO TABS
7.5000 mg | ORAL_TABLET | Freq: Once | ORAL | Status: AC
  Administered 2021-04-29: 7.5 mg via ORAL
  Filled 2021-04-29: qty 1

## 2021-04-29 MED ORDER — WARFARIN - PHARMACIST DOSING INPATIENT
Freq: Every day | Status: DC
Start: 2021-04-29 — End: 2021-05-01

## 2021-04-29 NOTE — Progress Notes (Signed)
Pt received from ED via stretcher, patient walked to the room from stretcher outside the room. AO x4, denied any pain or discomfort. Heparin drip running at 106m/hr. CHG bath given, connected to tele and CCMD notified. Call bell within reach. Oriented patient to room and call bell system. Righ hand PIV pulled out per patient. Switched heparin to right AC site. Call bell within reach. Will continue to monitor.  ?

## 2021-04-29 NOTE — Evaluation (Signed)
Occupational Therapy Evaluation ?Patient Details ?Name: Carla Leonard ?MRN: [id number] ?DOB: 1955/10/21 ?Today's Date: 04/29/2021 ? ? ?History of Present Illness 66 y.o. female who presented 04/27/21 with ascites and edema. Pt admitted with volume overload and known hx of cirrhosis. Pt also with acute DVT in L lower extremity. PMH: cirrhosis, adrenal insufficiency, CKD 3, anxiety, depression, hypertension, seizures  ? ?Clinical Impression ?  ?PTA, pt was living with alone and was independent. Pt currently presenting at baseline function; slight decrease in activity tolerance. Reporting concern about standing tolerance for showering.  Discussing the different DME options. Providing education on use of 3N1 for shower seat as well as safe tub transfer techniques. Pt also reporting her friend plans to install grabs bars and a hand held shower seat. Pt verbalizing understanding. Recommend dc to home once medically stable per physician. All acute OT needs met and will sign off. Thank you.  ?   ? ?Recommendations for follow up therapy are one component of a multi-disciplinary discharge planning process, led by the attending physician.  Recommendations may be updated based on patient status, additional functional criteria and insurance authorization.  ? ?Follow Up Recommendations ? No OT follow up  ?  ?Assistance Recommended at Discharge PRN  ?Patient can return home with the following   ? ?  ?Functional Status Assessment ? Patient has had a recent decline in their functional status and demonstrates the ability to make significant improvements in function in a reasonable and predictable amount of time.  ?Equipment Recommendations ? BSC/3in1  ?  ?Recommendations for Other Services   ? ? ?  ?Precautions / Restrictions Precautions ?Precautions: Other (comment) ?Precaution Comments: hx of seizures ?Restrictions ?Weight Bearing Restrictions: No  ? ?  ? ?Mobility Bed Mobility ?Overal bed mobility: Modified Independent ?  ?  ?  ?   ?  ?  ?General bed mobility comments: Pt able to transition supine > sit EOB without assistance, HOB elevated. ?  ? ?Transfers ?Overall transfer level: Independent ?Equipment used: None ?  ?  ?  ?  ?  ?  ?  ?General transfer comment: Able to come to stand from EOB without LOB or assistance. ?  ? ?  ?Balance Overall balance assessment: Independent ?  ?  ?  ?  ?  ?  ?  ?  ?  ?  ?  ?  ?  ?  ?  ?  ?  ?  ?   ? ?ADL either performed or assessed with clinical judgement  ? ?ADL Overall ADL's : At baseline ?  ?  ?  ?  ?  ?  ?  ?  ?  ?  ?  ?  ?  ?  ?  ?  ?  ?  ?  ?General ADL Comments: demonstrating functional mobility in room. Reporting feeling at baseline. Providing education on use of 3N1 for shower seat and then safe tub transfer techniques. Pt very agreeable and thanksful for inforamtion  ? ? ? ?Vision   ?   ?   ?Perception   ?  ?Praxis   ?  ? ?Pertinent Vitals/Pain Pain Assessment ?Pain Assessment: Faces ?Faces Pain Scale: Hurts a little bit ?Pain Location: generalized grimacing with mobility ?Pain Descriptors / Indicators: Grimacing ?Pain Intervention(s): Monitored during session, Limited activity within patient's tolerance, Repositioned  ? ? ? ?Hand Dominance Right ?  ?Extremity/Trunk Assessment Upper Extremity Assessment ?Upper Extremity Assessment: Overall WFL for tasks assessed ?  ?Lower Extremity Assessment ?Lower Extremity  Assessment: Overall WFL for tasks assessed ?  ?Cervical / Trunk Assessment ?Cervical / Trunk Assessment: Normal ?  ?Communication Communication ?Communication: No difficulties ?  ?Cognition Arousal/Alertness: Awake/alert ?Behavior During Therapy: [facility] for tasks assessed/performed ?Overall Cognitive Status: Within Functional Limits for tasks assessed ?  ?  ?  ?  ?  ?  ?  ?  ?  ?  ?  ?  ?  ?  ?  ?  ?General Comments: Patient very pleasant, tangential ?  ?  ?General Comments  VSS on RA ? ?  ?Exercises   ?  ?Shoulder Instructions    ? ? ?Home Living Family/patient expects to be discharged to::  Private residence ?Living Arrangements: Alone ?Available Help at Discharge: Neighbor;Friend(s);Available 24 hours/day;Family ?Type of Home: Apartment ?Home Access: Stairs to enter ?Entrance Stairs-Number of Steps: 2 (short steps) ?Entrance Stairs-Rails: Left ?Home Layout: One level ?  ?  ?Bathroom Shower/Tub: Tub/shower unit;Sponge bathes at baseline ?  ?Bathroom Toilet: Standard ?Bathroom Accessibility: Yes ?  ?Home Equipment: Cane - single point;Rollator (4 wheels);Grab bars - tub/shower (grab bars to be installed soon) ?  ?  ?  ? ?  ?Prior Functioning/Environment Prior Level of Function : Independent/Modified Independent ?  ?  ?  ?  ?  ?  ?Mobility Comments: Able to ambulate without UE support around home. x1 fall recently in which leg buckled, otherwise only fallen secondary to seizures. Uses SPC outside home. ?ADLs Comments: Uses [employer] transportation. Sponge bathes due to being nervous standing in shower. ?  ? ?  ?  ?OT Problem List: Decreased activity tolerance ?  ?   ?OT Treatment/Interventions:    ?  ?OT Goals(Current goals can be found in the care plan section) Acute Rehab OT Goals ?Patient Stated Goal: Go home ?OT Goal Formulation: All assessment and education complete, DC therapy  ?OT Frequency:   ?  ? ?Co-evaluation   ?  ?  ?  ?  ? ?  ?AM-PAC OT "6 Clicks" Daily Activity     ?Outcome Measure Help from another person eating meals?: None ?Help from another person taking care of personal grooming?: None ?Help from another person toileting, which includes using toliet, bedpan, or urinal?: None ?Help from another person bathing (including washing, rinsing, drying)?: None ?Help from another person to put on and taking off regular upper body clothing?: None ?Help from another person to put on and taking off regular lower body clothing?: None ?6 Click Score: 24 ?  ?End of Session Nurse Communication: Mobility status ? ?Activity Tolerance: Patient tolerated treatment well ?Patient left: in bed;with  nursing/sitter in room;with call bell/phone within reach ? ?OT Visit Diagnosis: Other abnormalities of gait and mobility (R26.89)  ?              ?Time: 1130-1140 ?OT Time Calculation (min): 10 min ?Charges:  OT General Charges ?$OT Visit: 1 Visit ?OT Evaluation ?$OT Eval Low Complexity: 1 Low ? ?Jaymarion Trombly MSOT, OTR/L ?Acute Rehab ?Pager: [phone] ?Office: [phone] ? ?Jacquelina Hewins M Kimi Bordeau ?04/29/2021, 12:57 PM ?

## 2021-04-29 NOTE — Progress Notes (Signed)
?                                  PROGRESS NOTE                                             ?                                                                                                                     ?                                         ? ? Patient Demographics:  ? ? Carla Leonard, is a 66 y.o. female, DOB - Dec 11, 1955, ZOX:[id number] ? ?Outpatient Primary MD for the patient is Bonnita Nasuti, MD    LOS - 1  Admit date - 04/27/2021   ? ?Chief Complaint  ?Patient presents with  ? Ascites  ?    ? ?Brief Narrative (HPI from H&P)   66 y.o. female with medical history significant of cirrhosis, adrenal insufficiency, CKD 3, anxiety, depression, hypertension, seizures presenting with ascites and edema.  She presented to the hospital with abdominal distention, also some swelling in her lower legs more in the left lower extremity, in the ER she was diagnosed with worsening ascites and fluid overload along with acute left lower extremity DVT in the setting of thrombocytopenia. ? ? Subjective:  ? ?Patient in bed, appears comfortable, denies any headache, no fever, no chest pain or pressure, no shortness of breath , no abdominal pain. No focal weakness. ? ? Assessment  & Plan :  ? ?1.  Acute left lower extremity DVT in the setting of chronic cirrhosis induced thrombocytopenia.  For now heparin drip with caution, monitor for any signs of bleeding or worsening thrombocytopenia, if she cannot tolerate anticoagulation then IVC filter.  Due to her antiseizure medications she will be on Coumadin long-term if she is able to tolerate anticoagulation. ? ?2.  History of alcoholic cirrhosis with portal hypertension, ascites and lower extremity edema.  Ultrasound-guided paracentesis ordered and she underwent 2.5 L of fluid removal on 04/28/2021, no signs of SBP, no signs of bleeding, diuretic dose adjusted, outpatient GI follow-up postdischarge. ? ?3.  History of periumbilical hernia.   Outpatient surgery follow-up which is still pending on last admission.  Patient requested to set up the appointment.  She forgot last time. ? ?4.  History of adrenal insufficiency.  Continue hydrocortisone dose adjusted, outpatient endocrine follow-up still pending from last admission, request PCP to assist the patient. ? ?5.  Anxiety and depression.  On trazodone and Xanax.  Monitor. ? ?6.  History of seizure disorder.  Continue Keppra and primidone. ? ?7.  Hypokalemia.  Replaced. ? ?8.  History of macrocytic anemia.  Outpatient follow-up with PCP. ? ?9.  Morbid Obesity with BMI of 44.  Follow with PCP for weight loss. ? ?10. Chronic cirrhosis induced thrombocytopenia.  Monitor with anticoagulation closely ? ?Lab Results  ?Component Value Date  ? PLT 69 (L) 04/29/2021  ? ? ? ?   ? ?Condition - Fair ? ?Family Communication  :  None present ? ?Code Status :  Full ? ?Consults  :  IR ? ?PUD Prophylaxis : PPI ? ? Procedures  :    ? ?Leg [place] -  RIGHT: - No evidence of common femoral vein obstruction.  LEFT: - Findings consistent with acute deep vein thrombosis involving the left popliteal vein. - A cystic structure is found in the popliteal fossa. ? ?CT - 1. Large volume abdominopelvic ascites, increased from earlier this month. 2. Hepatic cirrhosis with mild splenomegaly. 3. Moderate-sized fat containing umbilical hernia. 4. Subcutaneous edema of the lower anterior abdominal wall, asymmetric to the left. Recommend correlation for cellulitis. Aortic Atherosclerosis (ICD10-I70.0).  ? ?   ? ?Disposition Plan  :   ? ?Status is: Observation ? ?DVT Prophylaxis  :  Hep gtt >> Coumadin ? ?Lab Results  ?Component Value Date  ? PLT 69 (L) 04/29/2021  ? ? ?Diet :  ?Diet Order   ? ?       ?  Diet Heart Room service appropriate? Yes; Fluid consistency: Thin; Fluid restriction: 1200 mL Fluid  Diet effective now       ?  ? ?  ?  ? ?  ?  ? ?Inpatient Medications ? ?Scheduled Meds: ? furosemide  40 mg Oral Daily  ? hydrocortisone  20 mg  Oral BID  ? levETIRAcetam  500 mg Oral BID  ? midodrine  10 mg Oral TID WC  ? pantoprazole  40 mg Oral Daily  ? potassium chloride  40 mEq Oral Daily  ? primidone  100 mg Oral q morning  ? sodium chloride flush  3 mL Intravenous Q12H  ? spironolactone  50 mg Oral Daily  ? ?Continuous Infusions: ? heparin 1,400 Units/hr (04/28/21 2321)  ? ?PRN Meds:.acetaminophen **OR** acetaminophen, ALPRAZolam, polyethylene glycol, traMADol, traZODone ? ?Antibiotics  :   ? ?Anti-infectives (From admission, onward)  ? ? None  ? ?  ? ? ? Time Spent in minutes  30 ? ? ?Lala Lund M.D on 04/29/2021 at 10:23 AM ? ?To page go to [email or web address]  ? ?[facility] -  Office  [phone] ? ?See all Orders from today for further details ? ? ? Objective:  ? ?Vitals:  ? 04/29/21 0344 04/29/21 0500 04/29/21 0981 04/29/21 0923  ?BP: 104/60   (!) 122/98  ?Pulse: 76   99  ?Resp: 17   19  ?Temp: 98.5 ?F (36.9 ?C)   99 ?F (37.2 ?C)  ?TempSrc: Oral   Oral  ?SpO2: 96%   100%  ?Weight:  114.7 kg 114.7 kg   ? ? ?Wt Readings from Last 3 Encounters:  ?04/29/21 114.7 kg  ?04/12/21 109 kg  ?03/29/21 106.2 kg  ? ? ? ?Intake/Output Summary (Last 24 hours) at 04/29/2021 1023 ?Last data filed at 04/29/2021 0940 ?Gross per 24 hour  ?Intake 770.32 ml  ?Output --  ?Net 770.32 ml  ? ? ? ?Physical Exam ? ?Awake Alert, No new F.N deficits, Normal affect ?[place].AT,PERRAL ?Supple Neck, No JVD,   ?Symmetrical Chest wall movement, Good air movement bilaterally, CTAB ?RRR,No Gallops, Rubs  or new Murmurs,  ?+ve B.Sounds, Abd Soft mildly distended without any tenderness ?1+ edema L.Leg > R. Leg  ?  ? ? Data Review:  ? ? ?CBC ?Recent Labs  ?Lab 04/27/21 ?1716 04/28/21 ?0355 04/29/21 ?0228  ?WBC 7.8 7.7 6.6  ?HGB 10.8* 10.5* 10.1*  ?HCT 32.6* 31.5* 30.4*  ?PLT 70* 65* 69*  ?MCV 101.2* 101.9* 100.7*  ?MCH 33.5 34.0 33.4  ?MCHC 33.1 33.3 33.2  ?RDW 16.0* 16.2* 15.9*  ?LYMPHSABS 0.6*  --  0.6*  ?MONOABS 0.6  --  0.7  ?EOSABS 0.1  --  0.2  ?BASOSABS 0.0  --  0.0   ? ? ?Electrolytes ?Recent Labs  ?Lab 04/27/21 ?1716 04/27/21 ?2230 04/28/21 ?0355 04/29/21 ?0228  ?NA 136  --  135 137  ?K 3.3*  --  3.0* 3.6  ?CL 103  --  101 102  ?CO2 27  --  27 28  ?GLUCOSE 106*  --  106* 96  ?BUN 19  --  19 20  ?CREATININE 1.00  --  1.06* 1.08*  ?CALCIUM 8.3*  --  8.0* 8.1*  ?AST 41  --  37 34  ?ALT 32  --  30 29  ?ALKPHOS 140*  --  102 106  ?BILITOT 1.4*  --  1.8* 1.7*  ?ALBUMIN 2.7*  --  2.4* 2.4*  ?MG  --  2.0  --  1.9  ?CRP  --   --   --  2.7*  ?INR  --  1.4* 1.3*  --   ? ?Micro Results ?Recent Results (from the past 240 hour(s))  ?Gram stain     Status: None  ? Collection Time: 04/28/21 12:49 PM  ? Specimen: Abdomen; Peritoneal Fluid  ?Result Value Ref Range Status  ? Specimen Description PERITONEAL FLUID  Final  ? Special Requests ABDOMEN  Final  ? Gram Stain   Final  ?  WBC PRESENT,BOTH PMN AND MONONUCLEAR ?NO ORGANISMS SEEN ?CYTOSPIN SMEAR ?Performed at [facility], [place] [address], [place], [place] [id number] ?  ? Report Status 04/28/2021 FINAL  Final  ? ? ?Radiology Reports ?CT Abdomen Pelvis Wo Contrast ? ?Result Date: 04/27/2021 ?CLINICAL DATA:  Increasing ascites and lower extremity swelling. EXAM: CT ABDOMEN AND PELVIS WITHOUT CONTRAST TECHNIQUE: Multidetector CT imaging of the abdomen and pelvis was performed following the standard protocol without IV contrast. RADIATION DOSE REDUCTION: This exam was performed according to the departmental dose-optimization program which includes automated exposure control, adjustment of the mA and/or kV according to patient size and/or use of iterative reconstruction technique. COMPARISON:  Contrast-enhanced CT 04/06/2021 FINDINGS: Lower chest: Normal heart size with coronary artery calcifications. No pleural effusion. Hepatobiliary: Hepatic cirrhosis with shrunken liver and nodular contours. No focal liver lesion on this unenhanced exam. Cholecystectomy with stable biliary tree. Pancreas: No ductal dilatation or inflammation. Spleen:  Mildly enlarged spanning 14.9 x 12.7 x 5.5 cm (volume = 540 cm^3). No focal abnormality on this unenhanced exam. Adrenals/Urinary Tract: No adrenal nodule. No hydronephrosis. Calcification in the left renal hilum felt to be vasc

## 2021-04-29 NOTE — Progress Notes (Signed)
Patient's home meds counted and brought down to pharmacy.  ?Carla Leonard  ?

## 2021-04-29 NOTE — TOC Benefit Eligibility Note (Signed)
Patient Advocate Encounter ? ?Insurance verification completed.   ? ?The patient is currently admitted and upon discharge could be taking Eliquis 5 mg. ? ?The current 30 day co-pay is, $0.00.  ? ?The patient is insured through [employer] Part D  ? ? ? ?Lyndel Safe, CPhT ?Pharmacy Patient Advocate Specialist ?[facility] Patient Advocate Team ?Direct Number: [phone]  Fax: [phone] ? ? ? ? ? ?  ?

## 2021-04-29 NOTE — Progress Notes (Signed)
When entered the room, patient was trying to take her own medication, midodrine and keppra. Educated patient that she cannot take her home medication here, I had her night medication with me. She stated she needed to take her midodrine, last dose per [facility] was at [place] in ED. Pt stated she had taken her own xanax at 0700 and midodrine at 1600 down at ED and told staff because it was late and she was worried. When asked patient that she cannot have controlled substance with her, its either needs to go home with family or to the security. Pt stated she had one tablet of xanax and she took it in the morning, the bottle is empty. When checked her medication, she had 18 pills of 0.5 mg xanax, 2 pills of tramadol, hydrocortisone pill, primidone, Midodrine, keppra, lasix. [place] refused to give it to security or to the family. Stated it had been with her last time she was here and while in ED. She stated she is not going take her own medication. Educated patient on hospital policy, but pt refused to send medication to security. Charge nurse aware , will continue to monitor.  ?

## 2021-04-29 NOTE — Progress Notes (Signed)
ANTICOAGULATION CONSULT NOTE - Follow Up Consult ? ?Pharmacy Consult for heparin ?Indication: DVT ? ?Labs: ?Recent Labs  ?  04/27/21 ?1716 04/27/21 ?1716 04/27/21 ?2230 04/28/21 ?0355 04/28/21 ?1321 04/28/21 ?2149 04/29/21 ?0228  ?HGB 10.8*  --   --  10.5*  --   --  10.1*  ?HCT 32.6*  --   --  31.5*  --   --  30.4*  ?PLT 70*  --   --  65*  --   --  69*  ?LABPROT  --   --  16.5* 16.1*  --   --   --   ?INR  --   --  1.4* 1.3*  --   --   --   ?HEPARINUNFRC  --    < >  --  0.65 0.92* 0.84* 0.54  ?CREATININE 1.00  --   --  1.06*  --   --  1.08*  ? < > = values in this interval not displayed.  ? ? ?Assessment/Plan:  ?66yo female therapeutic on heparin after rate change. Will continue infusion at current rate of 1400 units/hr and confirm stable with additional level.  ? ?Wynona Neat, PharmD, BCPS  ?04/29/2021,4:07 AM ? ? ?

## 2021-04-29 NOTE — Progress Notes (Addendum)
ANTICOAGULATION CONSULT NOTE  ?Pharmacy Consult for heparin + warfarin ?Indication: DVT ? ?Allergies  ?Allergen Reactions  ? Sulfa Antibiotics Anaphylaxis  ? Prednisone Other (See Comments)  ?  hallucinations  ? Nsaids Other (See Comments)  ?  Stomach pain, diarrhea  ? Other Other (See Comments)  ?  Steroids: hallucinations  ? ? ?Patient Measurements: ?Weight: 114.7 kg (252 lb 13.9 oz) ?Heparin Dosing Weight: 109 kg  ? ?Vital Signs: ?Temp: 99 ?F (37.2 ?C) (04/27 9892) ?Temp Source: Oral (04/27 1194) ?BP: 122/98 (04/27 1740) ?Pulse Rate: 99 (04/27 0923) ? ?Labs: ?Recent Labs  ?  04/27/21 ?1716 04/27/21 ?2230 04/28/21 ?0355 04/28/21 ?1321 04/28/21 ?2149 04/29/21 ?8144 04/29/21 ?8185  ?HGB 10.8*  --  10.5*  --   --  10.1*  --   ?HCT 32.6*  --  31.5*  --   --  30.4*  --   ?PLT 70*  --  65*  --   --  69*  --   ?LABPROT  --  16.5* 16.1*  --   --   --   --   ?INR  --  1.4* 1.3*  --   --   --   --   ?HEPARINUNFRC  --   --  0.65   < > 0.84* 0.54 0.61  ?CREATININE 1.00  --  1.06*  --   --  1.08*  --   ? < > = values in this interval not displayed.  ? ? ? ?Estimated Creatinine Clearance: 61.7 mL/min (A) (by C-G formula based on SCr of 1.08 mg/dL (H)). ? ?Assessment: ?16 YOF with increased ascites found to have an acute left sided DVT of popliteal vein. Pharmacy consulted to start IV heparin.  ?  ?Heparin level is therapeutic at 0.61, H/H stable, pltc remains low but this appears to be a chronic issue. Pharmacy also to begin warfarin given various DOAC DDIs with home primidone. INR 1.3 on admit. ? ?Goal of Therapy:  ?Heparin level 0.3-0.7 units/mL ?Monitor platelets by anticoagulation protocol: Yes ?  ?Plan:  ?Continue heparin 1400 units/h ?Daily heparin level and CBC ?Warfarin 7.'5mg'$  PO x1 ?Daily INR ? ? ?Arrie Senate, PharmD, BCPS, BCCP ?Clinical Pharmacist ?[phone] ?Please check AMION for all [place] numbers ?04/29/2021 ? ? ? ? ? ? ?

## 2021-04-29 NOTE — Plan of Care (Signed)

## 2021-04-29 NOTE — Evaluation (Signed)
Physical Therapy Evaluation & Discharge ?Patient Details ?Name: Carla Leonard ?MRN: [id number] ?DOB: 1955/05/29 ?Today's Date: 04/29/2021 ? ?History of Present Illness ? Pt is a 66 y.o. female who presented 04/27/21 with ascites and edema. Pt admitted with volume overload and known hx of cirrhosis. Pt also with acute DVT in L lower extremity. PMH: cirrhosis, adrenal insufficiency, CKD 3, anxiety, depression, hypertension, seizures ?  ?Clinical Impression ? Pt presents with condition above. PTA, she was living alone in a 1-level apartment with 2 short STE. At baseline, she normally ambulates around the home IND without UE but uses a SPC in the community. Currently, pt appears to be functioning at her baseline, performing all functional mobility mod I - IND, intermittently reaching out for a wall rail for support with gait. All education completed and questions answered. PT will sign off.  ?   ? ?Recommendations for follow up therapy are one component of a multi-disciplinary discharge planning process, led by the attending physician.  Recommendations may be updated based on patient status, additional functional criteria and insurance authorization. ? ?Follow Up Recommendations No PT follow up ? ?  ?Assistance Recommended at Discharge None  ?Patient can return home with the following ? Assist for transportation;Assistance with cooking/housework ? ?  ?Equipment Recommendations Other (comment) (shower chair/tub bench)  ?Recommendations for Other Services ?    ?  ?Functional Status Assessment Patient has not had a recent decline in their functional status  ? ?  ?Precautions / Restrictions Precautions ?Precautions: Other (comment) ?Precaution Comments: hx of seizures ?Restrictions ?Weight Bearing Restrictions: No  ? ?  ? ?Mobility ? Bed Mobility ?Overal bed mobility: Modified Independent ?  ?  ?  ?  ?  ?  ?General bed mobility comments: Pt able to transition supine > sit EOB without assistance, HOB elevated. ?   ? ?Transfers ?Overall transfer level: Independent ?Equipment used: None ?  ?  ?  ?  ?  ?  ?  ?General transfer comment: Able to come to stand from EOB without LOB or assistance. ?  ? ?Ambulation/Gait ?Ambulation/Gait assistance: Modified independent (Device/Increase time) ?Gait Distance (Feet): 240 Feet ?Assistive device: None (intermittent use of wall rail with 1 UE for support) ?Gait Pattern/deviations: Step-through pattern, Decreased stride length ?Gait velocity: slightly reduced ?Gait velocity interpretation: 1.31 - 2.62 ft/sec, indicative of limited community ambulator ?  ?General Gait Details: Pt able to ambulate safely in hallway with dual tasking and head turns without LOB or need for assistance. As pt fatigued she did intermittently reach for a wall rail for support but did not tend to require UE support. DOE 2/4, SPO2 stable on RA, HR 130s ? ?Stairs ?  ?  ?  ?  ?  ? ?Wheelchair Mobility ?  ? ?Modified Rankin (Stroke Patients Only) ?  ? ?  ? ?Balance Overall balance assessment: Independent ?  ?  ?  ?  ?  ?  ?  ?  ?  ?  ?  ?  ?  ?  ?  ?  ?  ?  ?   ? ? ? ?Pertinent Vitals/Pain Pain Assessment ?Pain Assessment: Faces ?Faces Pain Scale: Hurts a little bit ?Pain Location: generalized grimacing with mobility ?Pain Descriptors / Indicators: Grimacing ?Pain Intervention(s): Limited activity within patient's tolerance, Monitored during session, Repositioned  ? ? ?Home Living Family/patient expects to be discharged to:: Private residence ?Living Arrangements: Alone ?Available Help at Discharge: Neighbor;Friend(s);Available 24 hours/day;Family ?Type of Home: Apartment ?Home Access: Stairs to enter ?  Entrance Stairs-Rails: Left ?Entrance Stairs-Number of Steps: 2 (short steps) ?  ?Home Layout: One level ?Home Equipment: Cane - single point;Rollator (4 wheels);Grab bars - tub/shower (grab bars to be installed soon) ?   ?  ?Prior Function Prior Level of Function : Independent/Modified Independent ?  ?  ?  ?  ?  ?   ?Mobility Comments: Able to ambulate without UE support around home. x1 fall recently in which leg buckled, otherwise only fallen secondary to seizures. Uses SPC outside home. ?ADLs Comments: Uses [employer] transportation. Sponge bathes due to being nervous standing in shower. ?  ? ? ?Hand Dominance  ?   ? ?  ?Extremity/Trunk Assessment  ? Upper Extremity Assessment ?Upper Extremity Assessment: Overall WFL for tasks assessed ?  ? ?Lower Extremity Assessment ?Lower Extremity Assessment: Overall WFL for tasks assessed ?  ? ?Cervical / Trunk Assessment ?Cervical / Trunk Assessment: Normal  ?Communication  ? Communication: No difficulties  ?Cognition Arousal/Alertness: Awake/alert ?Behavior During Therapy: [facility] for tasks assessed/performed ?Overall Cognitive Status: Within Functional Limits for tasks assessed ?  ?  ?  ?  ?  ?  ?  ?  ?  ?  ?  ?  ?  ?  ?  ?  ?General Comments: Patient very pleasant, tangential ?  ?  ? ?  ?General Comments General comments (skin integrity, edema, etc.): SpO2 stable on RA, HR up to 130s with activity ? ?  ?Exercises    ? ?Assessment/Plan  ?  ?PT Assessment Patient does not need any further PT services  ?PT Problem List   ? ?   ?  ?PT Treatment Interventions     ? ?PT Goals (Current goals can be found in the Care Plan section)  ?Acute Rehab PT Goals ?Patient Stated Goal: to get a shower chair ?PT Goal Formulation: All assessment and education complete, DC therapy ?Time For Goal Achievement: 04/30/21 ?Potential to Achieve Goals: Good ? ?  ?Frequency   ?  ? ? ?Co-evaluation   ?  ?  ?  ?  ? ? ?  ?AM-PAC PT "6 Clicks" Mobility  ?Outcome Measure Help needed turning from your back to your side while in a flat bed without using bedrails?: None ?Help needed moving from lying on your back to sitting on the side of a flat bed without using bedrails?: None ?Help needed moving to and from a bed to a chair (including a wheelchair)?: None ?Help needed standing up from a chair using your arms (e.g.,  wheelchair or bedside chair)?: None ?Help needed to walk in hospital room?: None ?Help needed climbing 3-5 steps with a railing? : None ?6 Click Score: 24 ? ?  ?End of Session   ?Activity Tolerance: Patient tolerated treatment well ?Patient left: in chair;with call bell/phone within reach;with chair alarm set ?Nurse Communication: Mobility status ?PT Visit Diagnosis: Other abnormalities of gait and mobility (R26.89);History of falling (Z91.81) ?  ? ?Time: 2633-3545 ?PT Time Calculation (min) (ACUTE ONLY): 28 min ? ? ?Charges:   PT Evaluation ?$PT Eval Low Complexity: 1 Low ?PT Treatments ?$Gait Training: 8-22 mins ?  ?   ? ? ?Moishe Spice, PT, DPT ?Acute Rehabilitation Services  ?Pager: [phone] ?Office: [phone] ? ? ?Maretta Bees Pettis ?04/29/2021, 9:57 AM ? ?

## 2021-04-30 ENCOUNTER — Other Ambulatory Visit (HOSPITAL_COMMUNITY): Payer: Self-pay

## 2021-04-30 DIAGNOSIS — F419 Anxiety disorder, unspecified: Secondary | ICD-10-CM | POA: Diagnosis not present

## 2021-04-30 DIAGNOSIS — I110 Hypertensive heart disease with heart failure: Secondary | ICD-10-CM | POA: Diagnosis not present

## 2021-04-30 DIAGNOSIS — I509 Heart failure, unspecified: Secondary | ICD-10-CM | POA: Diagnosis not present

## 2021-04-30 DIAGNOSIS — I82432 Acute embolism and thrombosis of left popliteal vein: Secondary | ICD-10-CM | POA: Diagnosis not present

## 2021-04-30 DIAGNOSIS — E8770 Fluid overload, unspecified: Secondary | ICD-10-CM | POA: Diagnosis not present

## 2021-04-30 DIAGNOSIS — K7011 Alcoholic hepatitis with ascites: Secondary | ICD-10-CM | POA: Diagnosis not present

## 2021-04-30 DIAGNOSIS — F339 Major depressive disorder, recurrent, unspecified: Secondary | ICD-10-CM | POA: Diagnosis not present

## 2021-04-30 DIAGNOSIS — R54 Age-related physical debility: Secondary | ICD-10-CM | POA: Diagnosis not present

## 2021-04-30 LAB — COMPREHENSIVE METABOLIC PANEL
ALT: 26 U/L (ref 0–44)
AST: 30 U/L (ref 15–41)
Albumin: 2.3 g/dL — ABNORMAL LOW (ref 3.5–5.0)
Alkaline Phosphatase: 99 U/L (ref 38–126)
Anion gap: 7 (ref 5–15)
BUN: 20 mg/dL (ref 8–23)
CO2: 26 mmol/L (ref 22–32)
Calcium: 8.4 mg/dL — ABNORMAL LOW (ref 8.9–10.3)
Chloride: 102 mmol/L (ref 98–111)
Creatinine, Ser: 1.05 mg/dL — ABNORMAL HIGH (ref 0.44–1.00)
GFR, Estimated: 59 mL/min — ABNORMAL LOW (ref >60)
Glucose, Bld: 103 mg/dL — ABNORMAL HIGH (ref 70–99)
Potassium: 4.3 mmol/L (ref 3.5–5.1)
Sodium: 135 mmol/L (ref 135–145)
Total Bilirubin: 1.4 mg/dL — ABNORMAL HIGH (ref 0.3–1.2)
Total Protein: 5.4 g/dL — ABNORMAL LOW (ref 6.5–8.1)

## 2021-04-30 LAB — HEPARIN LEVEL (UNFRACTIONATED): Heparin Unfractionated: 0.5 IU/mL (ref 0.30–0.70)

## 2021-04-30 LAB — CBC WITH DIFFERENTIAL/PLATELET
Abs Immature Granulocytes: 0.02 10*3/uL (ref 0.00–0.07)
Basophils Absolute: 0 10*3/uL (ref 0.0–0.1)
Basophils Relative: 0 %
Eosinophils Absolute: 0.1 10*3/uL (ref 0.0–0.5)
Eosinophils Relative: 2 %
HCT: 27.8 % — ABNORMAL LOW (ref 36.0–46.0)
Hemoglobin: 9.3 g/dL — ABNORMAL LOW (ref 12.0–15.0)
Immature Granulocytes: 0 %
Lymphocytes Relative: 13 %
Lymphs Abs: 0.8 10*3/uL (ref 0.7–4.0)
MCH: 33.3 pg (ref 26.0–34.0)
MCHC: 33.5 g/dL (ref 30.0–36.0)
MCV: 99.6 fL (ref 80.0–100.0)
Monocytes Absolute: 0.5 10*3/uL (ref 0.1–1.0)
Monocytes Relative: 8 %
Neutro Abs: 4.4 10*3/uL (ref 1.7–7.7)
Neutrophils Relative %: 77 %
Platelets: 73 10*3/uL — ABNORMAL LOW (ref 150–400)
RBC: 2.79 MIL/uL — ABNORMAL LOW (ref 3.87–5.11)
RDW: 16.2 % — ABNORMAL HIGH (ref 11.5–15.5)
WBC: 5.8 10*3/uL (ref 4.0–10.5)
nRBC: 0 % (ref 0.0–0.2)

## 2021-04-30 LAB — PROTIME-INR
INR: 1.5 — ABNORMAL HIGH (ref 0.8–1.2)
Prothrombin Time: 17.7 seconds — ABNORMAL HIGH (ref 11.4–15.2)

## 2021-04-30 LAB — MAGNESIUM: Magnesium: 1.9 mg/dL (ref 1.7–2.4)

## 2021-04-30 LAB — C-REACTIVE PROTEIN: CRP: 2.5 mg/dL — ABNORMAL HIGH (ref <1.0)

## 2021-04-30 MED ORDER — ENOXAPARIN SODIUM 120 MG/0.8ML IJ SOSY
120.0000 mg | PREFILLED_SYRINGE | Freq: Two times a day (BID) | INTRAMUSCULAR | Status: DC
  Administered 2021-04-30 – 2021-05-01 (×3): 120 mg via SUBCUTANEOUS
  Filled 2021-04-30 (×4): qty 0.8

## 2021-04-30 MED ORDER — FUROSEMIDE 40 MG PO TABS
80.0000 mg | ORAL_TABLET | Freq: Every day | ORAL | Status: DC
  Administered 2021-04-30 – 2021-05-01 (×2): 80 mg via ORAL
  Filled 2021-04-30 (×2): qty 2

## 2021-04-30 MED ORDER — WARFARIN SODIUM 7.5 MG PO TABS
7.5000 mg | ORAL_TABLET | Freq: Once | ORAL | Status: AC
  Administered 2021-04-30: 7.5 mg via ORAL
  Filled 2021-04-30: qty 1

## 2021-04-30 MED ORDER — ENOXAPARIN SODIUM 120 MG/0.8ML IJ SOSY
120.0000 mg | PREFILLED_SYRINGE | Freq: Two times a day (BID) | INTRAMUSCULAR | 0 refills | Status: DC
  Filled 2021-04-30: qty 12, 8d supply, fill #0

## 2021-04-30 MED ORDER — POTASSIUM CHLORIDE CRYS ER 10 MEQ PO TBCR: 10.0000 meq | EXTENDED_RELEASE_TABLET | Freq: Once | ORAL | Status: DC

## 2021-04-30 MED ORDER — FUROSEMIDE 10 MG/ML IJ SOLN
20.0000 mg | Freq: Once | INTRAMUSCULAR | Status: AC
  Administered 2021-04-30: 20 mg via INTRAVENOUS
  Filled 2021-04-30: qty 2

## 2021-04-30 NOTE — Plan of Care (Signed)
  Problem: Education: Goal: Knowledge of General Education information will improve Description Including pain rating scale, medication(s)/side effects and non-pharmacologic comfort measures Outcome: Progressing   

## 2021-04-30 NOTE — TOC Benefit Eligibility Note (Signed)
Patient Advocate Encounter ? ?Insurance verification completed.   ? ?The patient is currently admitted and upon discharge could be taking enoxaparin (Lovenox) 120 mg/0.8 ml. ? ?The current 7 day co-pay is, $0.00.  ? ?The patient is insured through [employer] Part D  ? ? ? ?Lyndel Safe, CPhT ?Pharmacy Patient Advocate Specialist ?[place] Patient Advocate Team ?Direct Number: [phone]  Fax: [phone] ? ? ? ? ? ?  ?

## 2021-04-30 NOTE — Care Management Important Message (Signed)
Important Message ? ?Patient Details  ?Name: Carla Leonard ?MRN: [id number] ?Date of Birth: 12-19-1955 ? ? ?Medicare Important Message Given:  Yes ? ? ? ? ?Shelda Altes ?04/30/2021, 7:36 AM ?

## 2021-04-30 NOTE — Consult Note (Signed)
? ?  [facility] CM Inpatient Consult ? ? ?04/30/2021 ? ?Carla Leonard ?01/19/1955 ?[id number] ? ?[place] [facility] [ACO] Patient: [employer] Medicare ? ?Primary Care Provider:  Bonnita Nasuti, MD is listed however patient is active with [facility]. ? ? ?Patient screened for less than 30 days readmission hospitalization with noted extreme high risk score for unplanned readmission risk and  to assess for potential [place] [facility] service needs for post hospital transition.  Review of patient's medical record reveals patient is active with [facility].  Spoke with Thayer Headings at [facility] and patient has appointment follow up on Tuesday, May 04, 2021 at 8:45 am. Updated and spoke with inpatient [facility] RnCM with information.  Patient  likely to transition over the weekend. ? ? ?Plan: Patient is to follow up with [facility] for TOC. ? ?For questions contact:  ? ?Natividad Brood, RN BSN CCM ?[facility] Liaison ? [phone] business mobile phone ?Toll free office [phone]  ?Fax number: [phone] ?[email or web address]  ? ? ? ? ? ?

## 2021-04-30 NOTE — TOC Initial Note (Addendum)
Transition of Care (TOC) - Initial/Assessment Note  ?Marvetta Gibbons Therapist, sports, BSN ?Transitions of Care ?Unit 4E- RN Case Manager ?See Treatment Team for direct phone #  ? ? ?Patient Details  ?Name: Carla Leonard ?MRN: [id number] ?Date of Birth: 1955-12-31 ? ?Transition of Care (TOC) CM/SW Contact:    ?Dahlia Client, Romeo Rabon, RN ?Phone Number: ?04/30/2021, 1:45 PM ? ?Clinical Narrative:                 ?CM spoke with pt at bedside for transition needs. Pt to go home on Lovenox- TOC pharmacy to fill script today and have med stored in [facility].  ?Per pt she also has home meds that are being stored in main pharmacy- pt's meds including TOC meds will need to be picked up from [facility] prior to discharge.  ? ?Discussed with pt primary care needs- pt states she had moved to [place]- but is now moving back to [place]- she is active with [facility] network - benefit w/ her [employer]- she reports she has a Marine scientist that calls her. She is also followed by the [facility] program for primary care needs.  ? ?Spoke with pt about shower chair needs and coverage under Medicare- estimated cost provided to pt for both 3n1 and shower chair- pt has decided to go with shower chair and understands it will be out of pocket cost- Agreeable to use in house DME provider- [facility]-  ?Call made to [facility] for DME- Shower chair- DME to be delivered to room prior to discharge.  ? ?Expected Discharge Plan: Home/Self Care ?Barriers to Discharge: No Barriers Identified ? ? ?Patient Goals and CMS Choice ?Patient states their goals for this hospitalization and ongoing recovery are:: return home ?CMS Medicare.gov Compare Post Acute Care list provided to:: Patient ?Choice offered to / list presented to : Patient ? ?Expected Discharge Plan and Services ?Expected Discharge Plan: Home/Self Care ?  ?Discharge Planning Services: CM Consult, Medication Assistance ?Post Acute Care Choice: Durable Medical Equipment ?Living arrangements for the past  2 months: Apartment ?                ?DME Arranged: Shower stool ?DME Agency: [facility] ?Date DME Agency Contacted: 04/30/21 ?Time DME Agency Contacted: 0623 ?Representative spoke with at DME Agency: Freda Munro ?HH Arranged: NA ?[place] Agency: NA ?  ?  ?  ? ?Prior Living Arrangements/Services ?Living arrangements for the past 2 months: Apartment ?Lives with:: Self ?Patient language and need for interpreter reviewed:: Yes ?Do you feel safe going back to the place where you live?: Yes      ?Need for Family Participation in Patient Care: Yes (Comment) ?Care giver support system in place?: Yes (comment) ?Current home services: DME ?Criminal Activity/Legal Involvement Pertinent to Current Situation/Hospitalization: No - Comment as needed ? ?Activities of Daily Living ?  ?  ? ?Permission Sought/Granted ?Permission sought to share information with : Customer service manager ?Permission granted to share information with : Yes, Verbal Permission Granted ?   ? Permission granted to share info w AGENCY: DME ?   ?   ? ?Emotional Assessment ?Appearance:: Appears stated age ?Attitude/Demeanor/Rapport: Engaged ?Affect (typically observed): Accepting, Appropriate, Pleasant ?Orientation: : Oriented to Self, Oriented to Place, Oriented to  Time, Oriented to Situation ?Alcohol / Substance Use: Not Applicable ?Psych Involvement: No (comment) ? 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 Diagnosis Date Noted  ? Volume overload 04/27/2021  ? Alcoholic cirrhosis of liver with ascites ([place]) 04/07/2021  ? Fall at home, initial encounter 02/12/2021  ? Closed bicondylar fracture of distal femur, left, initial encounter ([place]) 02/11/2021  ? Adrenal insufficiency ([place]) 02/05/2021  ? Cellulitis of abdominal wall 02/03/2021  ? Heme positive stool 02/03/2021  ? Ascites  02/03/2021  ? History of alcohol abuse 02/03/2021  ? Seizure disorder ([place]) 02/03/2021  ? Hypotension 02/02/2021  ? Lactic acidosis 02/02/2021  ? CKD (chronic kidney disease), stage III ([place]) 02/02/2021  ? Hyperbilirubinemia 02/02/2021  ? Hypoalbuminemia 02/02/2021  ? Thrombocytopenia ([place]) 02/02/2021  ? Hematoma 06/15/2015  ? Essential hypertension 06/15/2015  ? Acute kidney injury ([place]) 06/15/2015  ? Anxiety and depression 06/15/2015  ? Cellulitis of right lower extremity   ? Hypokalemia   ? Renal insufficiency   ? Cellulitis 06/14/2015  ? Cellulitis of right leg 06/14/2015  ? Osteoarthritis of left knee 06/17/2011  ? Ganglion of right wrist 06/17/2011  ? ?PCP:  Bonnita Nasuti, MD ?Pharmacy:   ?[facility] ?[address] ?[place] [address] ?Phone: [phone] Fax: [phone] ? ? ? ? ?Social Determinants of Health (SDOH) Interventions ?  ? ?Readmission Risk Interventions ? ?  04/12/2021  ? 12:25 PM  ?Readmission Risk Prevention Plan  ?Transportation Screening Complete  ?PCP or Specialist Appt within 3-5 Days Complete  ?[place] or Home Care Consult Complete  ?Social Work Consult for [place] Planning/Counseling Complete  ?Palliative Care Screening Not Applicable  ?Medication Review Press photographer) Complete  ? ? ? ?

## 2021-04-30 NOTE — Progress Notes (Signed)
ANTICOAGULATION CONSULT NOTE  ? ?Pharmacy Consult for enox + warfarin ?Indication: DVT ? ?Allergies  ?Allergen Reactions  ? Sulfa Antibiotics Anaphylaxis  ? Prednisone Other (See Comments)  ?  hallucinations  ? Nsaids Other (See Comments)  ?  Stomach pain, diarrhea  ? Other Other (See Comments)  ?  Steroids: hallucinations  ? ? ?Patient Measurements: ?Weight: 115.2 kg (253 lb 15.5 oz) ?Heparin Dosing Weight: 109 kg  ? ?Vital Signs: ?Temp: 98.5 ?F (36.9 ?C) (04/28 0308) ?Temp Source: Oral (04/28 0308) ?BP: 107/49 (04/28 0308) ?Pulse Rate: 68 (04/28 0308) ? ?Labs: ?Recent Labs  ?  04/27/21 ?2230 04/28/21 ?0355 04/28/21 ?1321 04/29/21 ?0228 04/29/21 ?9924 04/30/21 ?0145  ?HGB  --  10.5*  --  10.1*  --  9.3*  ?HCT  --  31.5*  --  30.4*  --  27.8*  ?PLT  --  65*  --  69*  --  73*  ?LABPROT 16.5* 16.1*  --   --   --  17.7*  ?INR 1.4* 1.3*  --   --   --  1.5*  ?HEPARINUNFRC  --  0.65   < > 0.54 0.61 0.50  ?CREATININE  --  1.06*  --  1.08*  --  1.05*  ? < > = values in this interval not displayed.  ? ? ? ?Estimated Creatinine Clearance: 63.7 mL/min (A) (by C-G formula based on SCr of 1.05 mg/dL (H)). ? ?Assessment: ?67 YOF with increased ascites found to have an acute left sided DVT of popliteal vein. Pharmacy consulted to start IV heparin.  ?  ?Heparin level is therapeutic this morning, will switch to enoxaparin per MD. INR 1.5 as expected, CBC stable. ? ?Goal of Therapy:  ?Heparin level 0.3-0.7 units/mL ?Monitor platelets by anticoagulation protocol: Yes ?  ?Plan:  ?Stop heparin ?Enoxaparin '120mg'$  SQ BID ?Warfarin 7.'5mg'$  PO x1 ?Daily INR ? ? ?Arrie Senate, PharmD, BCPS, BCCP ?Clinical Pharmacist ?[phone] ?Please check AMION for all [place] numbers ?04/30/2021 ? ? ? ? ? ? ?

## 2021-04-30 NOTE — Progress Notes (Signed)
?                                  PROGRESS NOTE                                             ?                                                                                                                     ?                                         ? ? Patient Demographics:  ? ? Carla Leonard, is a 66 y.o. female, DOB - August 15, 1955, IRS:[id number] ? ?Outpatient Primary MD for the patient is Bonnita Nasuti, MD    LOS - 2  Admit date - 04/27/2021   ? ?Chief Complaint  ?Patient presents with  ? Ascites  ?    ? ?Brief Narrative (HPI from H&P)   66 y.o. female with medical history significant of cirrhosis, adrenal insufficiency, CKD 3, anxiety, depression, hypertension, seizures presenting with ascites and edema.  She presented to the hospital with abdominal distention, also some swelling in her lower legs more in the left lower extremity, in the ER she was diagnosed with worsening ascites and fluid overload along with acute left lower extremity DVT in the setting of thrombocytopenia. ? ? Subjective:  ? ?Patient in bed, appears comfortable, denies any headache, no fever, no chest pain or pressure, no shortness of breath , no abdominal pain. No new focal weakness. ? ? Assessment  & Plan :  ? ?1.  Acute left lower extremity DVT in the setting of chronic cirrhosis induced thrombocytopenia.  For now heparin drip with caution, no signs of bleeding currently on combination of Lovenox and Coumadin, cannot do newer anticoagulation agents due to her antiseizure medications and poor compatibility issues.  Platelets are holding steady, no signs of bleeding.  If stable most likely will be discharged on Lovenox and Coumadin overlap on 04/30/2021.  Coumadin education today discussed with pharmacy. ? ?2.  History of alcoholic cirrhosis with portal hypertension, ascites and lower extremity edema.  Ultrasound-guided paracentesis ordered and she underwent 2.5 L of fluid removal on 04/28/2021, no signs of  SBP, no signs of bleeding, diuretic dose adjusted further on 04/30/2021, outpatient GI follow-up postdischarge. ? ?3.  History of periumbilical hernia.  Outpatient surgery follow-up which is still pending on last admission.  Patient requested to set up the appointment.  She forgot last time. ? ?4.  History of adrenal insufficiency.  Continue hydrocortisone dose adjusted, outpatient endocrine follow-up still pending from last admission, request PCP to assist the patient. ? ?5.  Anxiety  and depression.  On trazodone and Xanax.  Monitor. ? ?6.  History of seizure disorder.  Continue Keppra and primidone. ? ?7.  Hypokalemia.  Replaced. ? ?8.  History of macrocytic anemia.  Outpatient follow-up with PCP. ? ?9.  Morbid Obesity with BMI of 44.  Follow with PCP for weight loss. ? ?10. Chronic cirrhosis induced thrombocytopenia.  Monitor with anticoagulation closely ? ?Lab Results  ?Component Value Date  ? PLT 73 (L) 04/30/2021  ? ? ? ?   ? ?Condition - Fair ? ?Family Communication  :  None present ? ?Code Status :  Full ? ?Consults  :  IR ? ?PUD Prophylaxis : PPI ? ? Procedures  :    ? ?Leg [place] -  RIGHT: - No evidence of common femoral vein obstruction.  LEFT: - Findings consistent with acute deep vein thrombosis involving the left popliteal vein. - A cystic structure is found in the popliteal fossa. ? ?CT - 1. Large volume abdominopelvic ascites, increased from earlier this month. 2. Hepatic cirrhosis with mild splenomegaly. 3. Moderate-sized fat containing umbilical hernia. 4. Subcutaneous edema of the lower anterior abdominal wall, asymmetric to the left. Recommend correlation for cellulitis. Aortic Atherosclerosis (ICD10-I70.0).  ? ?   ? ?Disposition Plan  :   ? ?Status is: Observation ? ?DVT Prophylaxis  :  Hep gtt >> Coumadin ? ?Lab Results  ?Component Value Date  ? PLT 73 (L) 04/30/2021  ? ? ?Diet :  ?Diet Order   ? ?       ?  Diet Heart Room service appropriate? Yes; Fluid consistency: Thin; Fluid restriction: 1200 mL  Fluid  Diet effective now       ?  ? ?  ?  ? ?  ?  ? ?Inpatient Medications ? ?Scheduled Meds: ? enoxaparin (LOVENOX) injection  120 mg Subcutaneous Q12H  ? furosemide  20 mg Intravenous Once  ? furosemide  80 mg Oral Daily  ? hydrocortisone  20 mg Oral BID  ? levETIRAcetam  500 mg Oral BID  ? midodrine  10 mg Oral TID WC  ? pantoprazole  40 mg Oral Daily  ? potassium chloride  40 mEq Oral Daily  ? primidone  100 mg Oral q morning  ? sodium chloride flush  3 mL Intravenous Q12H  ? spironolactone  50 mg Oral Daily  ? warfarin  7.5 mg Oral ONCE-1600  ? Warfarin - Pharmacist Dosing Inpatient   Does not apply [id number]  ? ?Continuous Infusions: ? ? ?PRN Meds:.acetaminophen **OR** acetaminophen, ALPRAZolam, polyethylene glycol, traMADol, traZODone ? ?Antibiotics  :   ? ?Anti-infectives (From admission, onward)  ? ? None  ? ?  ? ? ? Time Spent in minutes  30 ? ? ?Lala Lund M.D on 04/30/2021 at 10:51 AM ? ?To page go to [email or web address]  ? ?[facility] -  Office  [phone] ? ?See all Orders from today for further details ? ? ? Objective:  ? ?Vitals:  ? 04/30/21 0011 04/30/21 0308 04/30/21 0829 04/30/21 0830  ?BP: (!) 112/41 (!) 107/49 (!) 95/47 (!) 95/47  ?Pulse: 73 68  68  ?Resp: (!) '22 12  12  '$ ?Temp: 98 ?F (36.7 ?C) 98.5 ?F (36.9 ?C) 98.3 ?F (36.8 ?C) 98.3 ?F (36.8 ?C)  ?TempSrc: Oral Oral Oral   ?SpO2: 100% 100%    ?Weight:  115.2 kg    ? ? ?Wt Readings from Last 3 Encounters:  ?04/30/21 115.2 kg  ?04/12/21 109 kg  ?03/29/21 106.2 kg  ? ? ? ?  Intake/Output Summary (Last 24 hours) at 04/30/2021 1051 ?Last data filed at 04/29/2021 2200 ?Gross per 24 hour  ?Intake 240 ml  ?Output --  ?Net 240 ml  ? ? ? ?Physical Exam ? ?Awake Alert, No new F.N deficits, Normal affect ?[place].AT,PERRAL ?Supple Neck, No JVD,   ?Symmetrical Chest wall movement, Good air movement bilaterally, CTAB ?RRR,No Gallops, Rubs or new Murmurs,  ?+ve B.Sounds, Abd Soft but is mildly distended, No tenderness,   ?1+ edema L.Leg > R. Leg  ?  ? ? Data  Review:  ? ? ?CBC ?Recent Labs  ?Lab 04/27/21 ?1716 04/28/21 ?0355 04/29/21 ?0228 04/30/21 ?0145  ?WBC 7.8 7.7 6.6 5.8  ?HGB 10.8* 10.5* 10.1* 9.3*  ?HCT 32.6* 31.5* 30.4* 27.8*  ?PLT 70* 65* 69* 73*  ?MCV 101.2* 101.9* 100.7* 99.6  ?MCH 33.5 34.0 33.4 33.3  ?MCHC 33.1 33.3 33.2 33.5  ?RDW 16.0* 16.2* 15.9* 16.2*  ?LYMPHSABS 0.6*  --  0.6* 0.8  ?MONOABS 0.6  --  0.7 0.5  ?EOSABS 0.1  --  0.2 0.1  ?BASOSABS 0.0  --  0.0 0.0  ? ? ?Electrolytes ?Recent Labs  ?Lab 04/27/21 ?1716 04/27/21 ?2230 04/28/21 ?0355 04/29/21 ?0228 04/30/21 ?0145  ?NA 136  --  135 137 135  ?K 3.3*  --  3.0* 3.6 4.3  ?CL 103  --  101 102 102  ?CO2 27  --  '27 28 26  '$ ?GLUCOSE 106*  --  106* 96 103*  ?BUN 19  --  '19 20 20  '$ ?CREATININE 1.00  --  1.06* 1.08* 1.05*  ?CALCIUM 8.3*  --  8.0* 8.1* 8.4*  ?AST 41  --  37 34 30  ?ALT 32  --  '30 29 26  '$ ?ALKPHOS 140*  --  102 106 99  ?BILITOT 1.4*  --  1.8* 1.7* 1.4*  ?ALBUMIN 2.7*  --  2.4* 2.4* 2.3*  ?MG  --  2.0  --  1.9 1.9  ?CRP  --   --   --  2.7* 2.5*  ?INR  --  1.4* 1.3*  --  1.5*  ? ?Micro Results ? ?Recent Results (from the past 240 hour(s))  ?Gram stain     Status: None  ? Collection Time: 04/28/21 12:49 PM  ? Specimen: Abdomen; Peritoneal Fluid  ?Result Value Ref Range Status  ? Specimen Description PERITONEAL FLUID  Final  ? Special Requests ABDOMEN  Final  ? Gram Stain   Final  ?  WBC PRESENT,BOTH PMN AND MONONUCLEAR ?NO ORGANISMS SEEN ?CYTOSPIN SMEAR ?Performed at [facility], [place] [address], [place], [place] [address] ?  ? Report Status 04/28/2021 FINAL  Final  ? ? ?Radiology Reports ? ?CT Abdomen Pelvis Wo Contrast ? ?Result Date: 04/27/2021 ?CLINICAL DATA:  Increasing ascites and lower extremity swelling. EXAM: CT ABDOMEN AND PELVIS WITHOUT CONTRAST TECHNIQUE: Multidetector CT imaging of the abdomen and pelvis was performed following the standard protocol without IV contrast. RADIATION DOSE REDUCTION: This exam was performed according to the departmental dose-optimization program which  includes automated exposure control, adjustment of the mA and/or kV according to patient size and/or use of iterative reconstruction technique. COMPARISON:  Contrast-enhanced CT 04/06/2021 FINDINGS: Lower c

## 2021-04-30 NOTE — Discharge Instructions (Addendum)
Follow with Primary MD Bonnita Nasuti, MD in 2 days, so follow-up with the general surgeon and endocrinologist recommended last admission.  Please follow-up with them within a week as well. ? ?Get CBC, CMP, INR, 2 view Chest X ray -  checked next visit within 2 days by Primary MD  ? ?Activity: As tolerated with Full fall precautions use walker/cane & assistance as needed ? ?Disposition Home   ? ?Diet: Heart Healthy   ? ?Special Instructions: If you have smoked or chewed Tobacco  in the last 2 yrs please stop smoking, stop any regular Alcohol  and or any Recreational drug use. ? ?On your next visit with your primary care physician please Get Medicines reviewed and adjusted. ? ?Please request your Prim.MD to go over all Hospital Tests and Procedure/Radiological results at the follow up, please get all Hospital records sent to your Prim MD by signing hospital release before you go home. ? ?If you experience worsening of your admission symptoms, develop shortness of breath, life threatening emergency, suicidal or homicidal thoughts you must seek medical attention immediately by calling 911 or calling your MD immediately  if symptoms less severe. ? ?You Must read complete instructions/literature along with all the possible adverse reactions/side effects for all the Medicines you take and that have been prescribed to you. Take any new Medicines after you have completely understood and accpet all the possible adverse reactions/side effects.  ? ? ?Information on my medicine - LOVENOX? (enoxaparin) ? ?This medication education was reviewed with me or my healthcare representative as part of my discharge preparation.  The pharmacist that spoke with me during my hospital stay was: Einar Grad, [facility] ? ? ?Why was lovenox? prescribed for you? ?Lovenox? was prescribed to treat blood clots that may have been found in the veins of your legs (deep vein thrombosis) or in your lungs (pulmonary embolism) and to reduce the risk of  them occurring again. ? ?What do You need to know about Lovenox?? ?Lovenox?  is given by a shot under the skin by you or a care provider. Lovenox? is injected into the right or left side of your abdomen at least 2 inches away from your belly button. Clean the injection site with an alcohol swab and let dry. Give only the amount prescribed to you by your healthcare provider and do not change the dose unless instructed by your health care provider. ? ?Try to administer the dose(s) about the same time every day. To minimize bruising and irritation, rotate where the shots are given. ? ?Store Lovenox? at room temperature, away from heat and direct light. Do not refrigerate or freeze the syringes. Keep away from children or pets. ? ?Take Lovenox? exactly as prescribed and DO NOT stop taking Lovenox? without talking to the doctor who prescribed the medication.  Stopping may increase your risk of developing a new blood clot.   ? ?After discharge, you should have regular appointments with your healthcare provider. ? ?Dispose of used syringe and cap in a sharps disposal container once used (if sharps disposal container unavailable, use any hard plastic jug that can be securely closed).  ?   ?What do you do if you miss a dose? ?If a dose of Lovenox? is not administered at the scheduled time, administer it as soon as possible on the same day and then resume regular administration schedule. The dose should not be doubled to make up for a missed dose. ? ?Important Safety Information ?A possible side effect  of Lovenox? is bleeding. You should call your healthcare provider right away if you experience any of the following: ?Bleeding from an injury or your nose that does not stop. ?Unusual colored urine (red or dark brown) or unusual colored stools (red or black). ?Unusual bruising for unknown reasons. ?A serious fall or if you hit your head (even if there is no bleeding). ? ?Some medicines may interact with Lovenox? and might  increase your risk of bleeding or clotting while on Lovenox?[place] Kitchen To help avoid this, consult your healthcare provider or pharmacist prior to using any new prescription or non-prescription medications, including herbals, vitamins, non-steroidal anti-inflammatory drugs (NSAIDs) and supplements. ? ?This website has more information on Lovenox? (enoxaparin): [email or web address]/. ? ?Information on my medicine - Coumadin?   (Warfarin) ? ?This medication education was reviewed with me or my healthcare representative as part of my discharge preparation.  The pharmacist that spoke with me during my hospital stay was:  Einar Grad, [facility] ? ?Why was Coumadin prescribed for you? ?Coumadin was prescribed for you because you have a blood clot or a medical condition that can cause an increased risk of forming blood clots. Blood clots can cause serious health problems by blocking the flow of blood to the heart, lung, or brain. Coumadin can prevent harmful blood clots from forming. ?As a reminder your indication for Coumadin is:  Deep Venous Thrombosis treatment ? ?What test will check on my response to Coumadin? ?While on Coumadin (warfarin) you will need to have an INR test regularly to ensure that your dose is keeping you in the desired range. The INR (international normalized ratio) number is calculated from the result of the laboratory test called prothrombin time (PT). ? ?If an INR APPOINTMENT HAS NOT ALREADY BEEN MADE FOR YOU please schedule an appointment to have this lab work done by your health care provider within 7 days. ?Your INR goal is usually a number between:  2 to 3 or your provider may give you a more narrow range like 2-2.5.  Ask your health care provider during an office visit what your goal INR is. ? ?What  do you need to  know  About  COUMADIN? ?Take Coumadin (warfarin) exactly as prescribed by your healthcare provider about the same time each day.  DO NOT stop taking without talking to the doctor who prescribed  the medication.  Stopping without other blood clot prevention medication to take the place of Coumadin may increase your risk of developing a new clot or stroke.  Get refills before you run out. ? ?What do you do if you miss a dose? ?If you miss a dose, take it as soon as you remember on the same day then continue your regularly scheduled regimen the next day.  Do not take two doses of Coumadin at the same time. ? ?Important Safety Information ?A possible side effect of Coumadin (Warfarin) is an increased risk of bleeding. You should call your healthcare provider right away if you experience any of the following: ?Bleeding from an injury or your nose that does not stop. ?Unusual colored urine (red or dark brown) or unusual colored stools (red or black). ?Unusual bruising for unknown reasons. ?A serious fall or if you hit your head (even if there is no bleeding). ? ?Some foods or medicines interact with Coumadin? (warfarin) and might alter your response to warfarin. To help avoid this: ?Eat a balanced diet, maintaining a consistent amount of Vitamin K. ?Notify your provider about major diet changes  you plan to make. ?Avoid alcohol or limit your intake to 1 drink for women and 2 drinks for men per day. ?(1 drink is 5 oz. wine, 12 oz. beer, or 1.5 oz. liquor.) ? ?Make sure that ANY health care provider who prescribes medication for you knows that you are taking Coumadin (warfarin).  Also make sure the healthcare provider who is monitoring your Coumadin knows when you have started a new medication including herbals and non-prescription products. ? ?Coumadin? (Warfarin)  Major Drug Interactions  ?Increased Warfarin Effect Decreased Warfarin Effect  ?Alcohol (large quantities) ?Antibiotics (esp. Septra/Bactrim, Flagyl, Cipro) ?Amiodarone (Cordarone) ?Aspirin (ASA) ?Cimetidine (Tagamet) ?Megestrol (Megace) ?NSAIDs (ibuprofen, naproxen, etc.) ?Piroxicam (Feldene) ?Propafenone (Rythmol SR) ?Propranolol (Inderal) ?Isoniazid  (INH) ?Posaconazole (Noxafil) Barbiturates (Phenobarbital) ?Carbamazepine (Tegretol) ?Chlordiazepoxide (Librium) ?Cholestyramine (Questran) ?Griseofulvin ?Oral Contraceptives ?Rifampin ?Sucralfate (Carafate)

## 2021-05-01 DIAGNOSIS — I82432 Acute embolism and thrombosis of left popliteal vein: Secondary | ICD-10-CM | POA: Diagnosis not present

## 2021-05-01 DIAGNOSIS — N183 Chronic kidney disease, stage 3 unspecified: Secondary | ICD-10-CM | POA: Diagnosis not present

## 2021-05-01 DIAGNOSIS — F419 Anxiety disorder, unspecified: Secondary | ICD-10-CM | POA: Diagnosis not present

## 2021-05-01 DIAGNOSIS — K7011 Alcoholic hepatitis with ascites: Secondary | ICD-10-CM | POA: Diagnosis not present

## 2021-05-01 LAB — CBC WITH DIFFERENTIAL/PLATELET
Abs Immature Granulocytes: 0.03 10*3/uL (ref 0.00–0.07)
Basophils Absolute: 0 10*3/uL (ref 0.0–0.1)
Basophils Relative: 0 %
Eosinophils Absolute: 0.1 10*3/uL (ref 0.0–0.5)
Eosinophils Relative: 2 %
HCT: 28.8 % — ABNORMAL LOW (ref 36.0–46.0)
Hemoglobin: 9.7 g/dL — ABNORMAL LOW (ref 12.0–15.0)
Immature Granulocytes: 1 %
Lymphocytes Relative: 12 %
Lymphs Abs: 0.6 10*3/uL — ABNORMAL LOW (ref 0.7–4.0)
MCH: 34 pg (ref 26.0–34.0)
MCHC: 33.7 g/dL (ref 30.0–36.0)
MCV: 101.1 fL — ABNORMAL HIGH (ref 80.0–100.0)
Monocytes Absolute: 0.6 10*3/uL (ref 0.1–1.0)
Monocytes Relative: 10 %
Neutro Abs: 4 10*3/uL (ref 1.7–7.7)
Neutrophils Relative %: 75 %
Platelets: 84 10*3/uL — ABNORMAL LOW (ref 150–400)
RBC: 2.85 MIL/uL — ABNORMAL LOW (ref 3.87–5.11)
RDW: 16.6 % — ABNORMAL HIGH (ref 11.5–15.5)
WBC: 5.3 10*3/uL (ref 4.0–10.5)
nRBC: 0 % (ref 0.0–0.2)

## 2021-05-01 LAB — PROTIME-INR
INR: 1.7 — ABNORMAL HIGH (ref 0.8–1.2)
Prothrombin Time: 20.1 seconds — ABNORMAL HIGH (ref 11.4–15.2)

## 2021-05-01 LAB — POTASSIUM: Potassium: 3.8 mmol/L (ref 3.5–5.1)

## 2021-05-01 MED ORDER — SPIRONOLACTONE 50 MG PO TABS: 50.0000 mg | ORAL_TABLET | Freq: Every day | ORAL | 0 refills | Status: DC

## 2021-05-01 MED ORDER — WARFARIN SODIUM 2.5 MG PO TABS: 2.5000 mg | ORAL_TABLET | Freq: Every day | ORAL | 0 refills | Status: DC

## 2021-05-01 MED ORDER — PANTOPRAZOLE SODIUM 40 MG PO TBEC: 40.0000 mg | DELAYED_RELEASE_TABLET | Freq: Every day | ORAL | 0 refills | Status: DC

## 2021-05-01 NOTE — TOC CM/SW Note (Signed)
Spoke with Ms. Waterfield at bedside. Shower chair has been delivered. Daughter to pick patient upon hospital dc. Denies having any further TOC needs. ? ?Marthenia Rolling, MSN, RN,BSN ?Inpatient [facility] Case Manager ?[phone]   ?

## 2021-05-01 NOTE — Progress Notes (Signed)
ANTICOAGULATION CONSULT NOTE  ? ?Pharmacy Consult for enox + warfarin ?Indication: DVT ? ?Allergies  ?Allergen Reactions  ? Sulfa Antibiotics Anaphylaxis  ? Prednisone Other (See Comments)  ?  hallucinations  ? Nsaids Other (See Comments)  ?  Stomach pain, diarrhea  ? Other Other (See Comments)  ?  Steroids: hallucinations  ? ? ?Patient Measurements: ?Weight: 113.9 kg (251 lb 1.7 oz) ?Heparin Dosing Weight: 109 kg  ? ?Vital Signs: ?Temp: 98.1 ?F (36.7 ?C) (04/29 9833) ?Temp Source: Oral (04/29 0336) ?BP: 103/45 (04/29 0336) ?Pulse Rate: 75 (04/29 0336) ? ?Labs: ?Recent Labs  ?  04/29/21 ?0228 04/29/21 ?0926 04/30/21 ?0145 05/01/21 ?0132  ?HGB 10.1*  --  9.3* 9.7*  ?HCT 30.4*  --  27.8* 28.8*  ?PLT 69*  --  73* 84*  ?LABPROT  --   --  17.7* 20.1*  ?INR  --   --  1.5* 1.7*  ?HEPARINUNFRC 0.54 0.61 0.50  --   ?CREATININE 1.08*  --  1.05*  --   ? ? ? ?Estimated Creatinine Clearance: 63.2 mL/min (A) (by C-G formula based on SCr of 1.05 mg/dL (H)). ? ?Assessment: ?45 YOF with increased ascites found to have an acute left sided DVT of popliteal vein. Pharmacy consulted to bridge enoxaparin to therapeutic warfarin for treatment of acute DVT. ?  ?INR 1.7, subtherapeutic, trending up ?Hgb/Hct stable ?Plt stable, trending up ?No s/sx of bleeding reported by RN ? ?Goal of Therapy:  ?INR: 2-3 ?Monitor platelets by anticoagulation protocol: Yes ?  ?Plan:  ?Repeat Warfarin 7.'5mg'$  PO x1, given appropriate rise in INR with previous doses.  ?Continue bridging with enoxaparin '120mg'$  SQ BID for a minimum of 5 days with warfarin and until INR > 2 for at least 2 consecutive readings 24h apart.  ?Daily INR, CBC ? ?Luisa Hart, PharmD, BCPS ?Clinical Pharmacist ?05/01/2021 8:19 AM  ? ?Please refer to [facility] for pharmacy phone number  ?

## 2021-05-01 NOTE — Plan of Care (Signed)
  Problem: Education: Goal: Knowledge of General Education information will improve Description Including pain rating scale, medication(s)/side effects and non-pharmacologic comfort measures Outcome: Progressing   

## 2021-05-01 NOTE — Discharge Summary (Addendum)
?                                                                                ? ?Carla Leonard:[id number] DOB: Sep 23, 1955 DOA: 04/27/2021 ? ?PCP: Bonnita Nasuti, MD ? ?Admit date: 04/27/2021  Discharge date: 05/01/2021 ? ?Admitted From: Home   Disposition:  Home ? ? ?Recommendations for Outpatient Follow-up:  ? ?Follow up with PCP in 1-2 weeks ? ?PCP Please obtain BMP/CBC, 2 view CXR in 1week,  (see Discharge instructions)  ? ?PCP Please follow up on the following pending results: Writer INR closely, once INR reaches to discontinue Lovenox overlap immediately due to her underlying thrombocytopenia.  She must follow with previously recommended general surgeon and endocrinologist as soon as possible, this recommendation was made at last admission. ? ? ?Home Health: None   ?Equipment/Devices: As below  ?Consultations: None  ?Discharge Condition: Stable    ?CODE STATUS: Full    ?Diet Recommendation: Heart Healthy strict 1.5 L fluid restriction per day. ?  ? ?Chief Complaint  ?Patient presents with  ? Ascites  ?  ? ?Brief history of present illness from the day of admission and additional interim summary   ? ?66 y.o. female with medical history significant of cirrhosis, adrenal insufficiency, CKD 3, anxiety, depression, hypertension, seizures presenting with ascites and edema.  She presented to the hospital with abdominal distention, also some swelling in her lower legs more in the left lower extremity, in the ER she was diagnosed with worsening ascites and fluid overload along with acute left lower extremity DVT in the setting of thrombocytopenia. ? ?                                                               Hospital Course  ? ? 1.  Acute left lower extremity DVT in the setting of chronic cirrhosis induced thrombocytopenia.  For now heparin drip with caution, no signs of bleeding currently on combination of Lovenox and  Coumadin, cannot do newer anticoagulation agents due to her antiseizure medications and poor compatibility issues.  Platelets are holding steady, no signs of bleeding.  If stable most likely will be discharged on Lovenox and Coumadin overlap on 04/30/2021.  Platelets are stable, INR is gradually rising no signs of bleeding, Coumadin education provided by pharmacy.  Will be discharged with 5-day supply of Lovenox along with Coumadin overlap and follow-up with PCP in 2 days.  Discontinue Lovenox once INR becomes therapeutic due to underlying thrombocytopenia.  Note she has chronic multiple bruises PCP to monitor. ?  ?2.  History of alcoholic cirrhosis with portal hypertension, ascites and lower extremity edema.  Ultrasound-guided paracentesis ordered and she underwent 2.5 L of fluid removal on 04/28/2021, no signs of SBP, no signs of bleeding, she is on Lasix, reiterated fluid restriction on a daily basis and added Aldactone to her diuretic regimen, PCP to monitor her fluid status and BMP closely. ?  ?3.  History of periumbilical hernia.  Outpatient surgery follow-up which is still pending on last admission.  Patient requested to set up the appointment.  She forgot last time. ?  ?4.  History of adrenal insufficiency.  Continue hydrocortisone as before, outpatient endocrine follow-up still pending from last admission, request PCP to assist the patient. ?  ?5.  Anxiety and depression.  On trazodone and Xanax.  Monitor. ?  ?6.  History of seizure disorder.  Continue Keppra and primidone. ?  ?7.  Hypokalemia.  Replaced. ?  ?8.  History of macrocytic anemia.  Outpatient follow-up with PCP. ?  ?9.  Morbid Obesity with BMI of 44.  Follow with PCP for weight loss. ?  ?10. Chronic cirrhosis induced thrombocytopenia.  PCP to monitor with anticoagulation closely ? ? ?Discharge diagnosis   ? ? ?Principal Problem: ?  Volume overload ?Active Problems: ?  Essential hypertension ?  Anxiety and depression ?  CKD (chronic kidney  disease), stage III ([place]) ?  Seizure disorder ([place]) ? ? ? ?Discharge instructions   ? ?Discharge Instructions   ? ? Diet - low sodium heart healthy   Complete by: As directed ?  ? Discharge instructions   Complete by: As directed ?  ? Follow with Primary MD Bonnita Nasuti, MD in 2 days, so follow-up with the general surgeon and endocrinologist recommended last admission.  Please follow-up with them within a week as well. ? ?Get CBC, CMP, INR, 2 view Chest X ray -  checked next visit within 2 days by Primary MD  ? ?Activity: As tolerated with Full fall precautions use walker/cane & assistance as needed ? ?Disposition Home   ? ?Diet: Heart Healthy 1.5 L strict fluid restriction per day ? ?Special Instructions: If you have smoked or chewed Tobacco  in the last 2 yrs please stop smoking, stop any regular Alcohol  and or any Recreational drug use. ? ?On your next visit with your primary care physician please Get Medicines reviewed and adjusted. ? ?Please request your Prim.MD to go over all Hospital Tests and Procedure/Radiological results at the follow up, please get all Hospital records sent to your Prim MD by signing hospital release before you go home. ? ?If you experience worsening of your admission symptoms, develop shortness of breath, life threatening emergency, suicidal or homicidal thoughts you must seek medical attention immediately by calling 911 or calling your MD immediately  if symptoms less severe. ? ?You Must read complete instructions/literature along with all the possible adverse reactions/side effects for all the Medicines you take and that have been prescribed to you. Take any new Medicines after you have completely understood and accpet all the possible adverse reactions/side effects.  ? Increase activity slowly   Complete by: As directed ?  ? No wound care   Complete by: As directed ?  ? ?  ? ? ?Discharge Medications  ? ?Allergies as of 05/01/2021   ? ?   Reactions  ? Sulfa Antibiotics Anaphylaxis  ?  Prednisone Other (See Comments)  ? hallucinations  ? Nsaids Other (See Comments)  ? Stomach pain, diarrhea  ? Other Other (See Comments)  ? Steroids: hallucinations  ? ?  ? ?  ?Medication List  ?  ? ?STOP taking these medications   ? ?cefUROXime 250 MG tablet ?Commonly known as: CEFTIN ?  ?loperamide 2 MG capsule ?Commonly known as: IMODIUM ?  ?promethazine 12.5 MG tablet ?Commonly known as: PHENERGAN ?  ? ?  ? ?TAKE these medications   ? ?ALPRAZolam 0.5  MG tablet ?Commonly known as: Duanne Moron ?Take 1 tablet by mouth 2 times a day as needed ?What changed:  ?how much to take ?how to take this ?when to take this ?reasons to take this ?  ?budesonide-formoterol 160-4.5 MCG/ACT inhaler ?Commonly known as: SYMBICORT ?Inhale 2 puffs into the lungs 2 (two) times daily as needed (shortness of breath/wheezing). ?  ?enoxaparin 120 MG/0.8ML injection ?Commonly known as: LOVENOX ?Inject 0.8 mLs (120 mg total) into the skin every 12 (twelve) hours. ?  ?feeding supplement Liqd ?Take 1 Container by mouth 2 (two) times daily as needed (appetite). ?  ?furosemide 20 MG tablet ?Commonly known as: LASIX ?Take 1 tablet (20 mg total) by mouth daily. ?What changed: how much to take ?  ?hydrocortisone 10 MG tablet ?Commonly known as: CORTEF ?Take 2 tablets (20 mg total) by mouth daily.  Do not stop it abruptly unless told by the endocrinologist. ?  ?levETIRAcetam 500 MG tablet ?Commonly known as: KEPPRA ?Take 1 tablet by mouth 2 times a day ?  ?midodrine 10 MG tablet ?Commonly known as: PROAMATINE ?Take 1 tablet (10 mg total) by mouth 3 (three) times daily with meals. ?  ?nystatin 100000 UNIT/ML suspension ?Commonly known as: MYCOSTATIN ?Take 4 mLs by mouth 4 (four) times daily. ?  ?pantoprazole 40 MG tablet ?Commonly known as: PROTONIX ?Take 1 tablet (40 mg total) by mouth daily. ?Start taking on: May 02, 2021 ?  ?potassium chloride 10 MEQ tablet ?Commonly known as: KLOR-CON M ?Take 20 mEq by mouth daily. ?  ?primidone 50 MG tablet ?Commonly  known as: MYSOLINE ?Take 100 mg by mouth every morning. ?  ?spironolactone 50 MG tablet ?Commonly known as: ALDACTONE ?Take 1 tablet (50 mg total) by mouth daily. ?Start taking on: May 02, 2021 ?What chang

## 2021-05-03 ENCOUNTER — Other Ambulatory Visit (HOSPITAL_COMMUNITY): Payer: Self-pay

## 2021-05-06 ENCOUNTER — Inpatient Hospital Stay (HOSPITAL_COMMUNITY): Payer: Medicare HMO

## 2021-05-06 ENCOUNTER — Encounter (HOSPITAL_COMMUNITY): Payer: Self-pay | Admitting: Emergency Medicine

## 2021-05-06 ENCOUNTER — Emergency Department (HOSPITAL_COMMUNITY): Payer: Medicare HMO

## 2021-05-06 ENCOUNTER — Inpatient Hospital Stay (HOSPITAL_COMMUNITY)
Admission: EM | Admit: 2021-05-06 | Discharge: 2021-05-11 | DRG: 871 | Disposition: A | Discharge status: Home / Self Care | Payer: Medicare HMO | Attending: Internal Medicine | Admitting: Internal Medicine

## 2021-05-06 ENCOUNTER — Other Ambulatory Visit: Payer: Self-pay

## 2021-05-06 DIAGNOSIS — E2740 Unspecified adrenocortical insufficiency: Secondary | ICD-10-CM | POA: Diagnosis not present

## 2021-05-06 DIAGNOSIS — K7031 Alcoholic cirrhosis of liver with ascites: Secondary | ICD-10-CM | POA: Diagnosis not present

## 2021-05-06 DIAGNOSIS — Z96653 Presence of artificial knee joint, bilateral: Secondary | ICD-10-CM | POA: Diagnosis present

## 2021-05-06 DIAGNOSIS — R110 Nausea: Secondary | ICD-10-CM | POA: Diagnosis not present

## 2021-05-06 DIAGNOSIS — K8050 Calculus of bile duct without cholangitis or cholecystitis without obstruction: Secondary | ICD-10-CM | POA: Diagnosis present

## 2021-05-06 DIAGNOSIS — D689 Coagulation defect, unspecified: Secondary | ICD-10-CM | POA: Diagnosis not present

## 2021-05-06 DIAGNOSIS — I129 Hypertensive chronic kidney disease with stage 1 through stage 4 chronic kidney disease, or unspecified chronic kidney disease: Secondary | ICD-10-CM | POA: Diagnosis present

## 2021-05-06 DIAGNOSIS — R58 Hemorrhage, not elsewhere classified: Secondary | ICD-10-CM | POA: Diagnosis present

## 2021-05-06 DIAGNOSIS — R791 Abnormal coagulation profile: Secondary | ICD-10-CM | POA: Diagnosis not present

## 2021-05-06 DIAGNOSIS — N189 Chronic kidney disease, unspecified: Secondary | ICD-10-CM | POA: Diagnosis present

## 2021-05-06 DIAGNOSIS — K652 Spontaneous bacterial peritonitis: Secondary | ICD-10-CM | POA: Diagnosis not present

## 2021-05-06 DIAGNOSIS — R5381 Other malaise: Secondary | ICD-10-CM

## 2021-05-06 DIAGNOSIS — F419 Anxiety disorder, unspecified: Secondary | ICD-10-CM | POA: Diagnosis present

## 2021-05-06 DIAGNOSIS — Z86718 Personal history of other venous thrombosis and embolism: Secondary | ICD-10-CM

## 2021-05-06 DIAGNOSIS — G40909 Epilepsy, unspecified, not intractable, without status epilepticus: Secondary | ICD-10-CM | POA: Diagnosis not present

## 2021-05-06 DIAGNOSIS — E872 Acidosis, unspecified: Secondary | ICD-10-CM | POA: Diagnosis present

## 2021-05-06 DIAGNOSIS — I9589 Other hypotension: Secondary | ICD-10-CM | POA: Diagnosis not present

## 2021-05-06 DIAGNOSIS — R238 Other skin changes: Secondary | ICD-10-CM | POA: Diagnosis present

## 2021-05-06 DIAGNOSIS — R932 Abnormal findings on diagnostic imaging of liver and biliary tract: Secondary | ICD-10-CM | POA: Insufficient documentation

## 2021-05-06 DIAGNOSIS — G4700 Insomnia, unspecified: Secondary | ICD-10-CM | POA: Diagnosis present

## 2021-05-06 DIAGNOSIS — F32A Depression, unspecified: Secondary | ICD-10-CM | POA: Diagnosis present

## 2021-05-06 DIAGNOSIS — N179 Acute kidney failure, unspecified: Secondary | ICD-10-CM | POA: Diagnosis not present

## 2021-05-06 DIAGNOSIS — E8809 Other disorders of plasma-protein metabolism, not elsewhere classified: Secondary | ICD-10-CM | POA: Diagnosis not present

## 2021-05-06 DIAGNOSIS — Z6841 Body Mass Index (BMI) 40.0 and over, adult: Secondary | ICD-10-CM | POA: Diagnosis not present

## 2021-05-06 DIAGNOSIS — D696 Thrombocytopenia, unspecified: Secondary | ICD-10-CM | POA: Diagnosis not present

## 2021-05-06 DIAGNOSIS — D62 Acute posthemorrhagic anemia: Secondary | ICD-10-CM

## 2021-05-06 DIAGNOSIS — Z888 Allergy status to other drugs, medicaments and biological substances status: Secondary | ICD-10-CM

## 2021-05-06 DIAGNOSIS — I959 Hypotension, unspecified: Secondary | ICD-10-CM | POA: Diagnosis present

## 2021-05-06 DIAGNOSIS — Z7189 Other specified counseling: Secondary | ICD-10-CM

## 2021-05-06 DIAGNOSIS — F17210 Nicotine dependence, cigarettes, uncomplicated: Secondary | ICD-10-CM | POA: Diagnosis present

## 2021-05-06 DIAGNOSIS — A419 Sepsis, unspecified organism: Secondary | ICD-10-CM | POA: Insufficient documentation

## 2021-05-06 DIAGNOSIS — D539 Nutritional anemia, unspecified: Secondary | ICD-10-CM | POA: Diagnosis present

## 2021-05-06 DIAGNOSIS — R112 Nausea with vomiting, unspecified: Secondary | ICD-10-CM | POA: Diagnosis not present

## 2021-05-06 DIAGNOSIS — R6520 Severe sepsis without septic shock: Secondary | ICD-10-CM | POA: Diagnosis not present

## 2021-05-06 DIAGNOSIS — N1831 Chronic kidney disease, stage 3a: Secondary | ICD-10-CM | POA: Diagnosis present

## 2021-05-06 DIAGNOSIS — R188 Other ascites: Secondary | ICD-10-CM | POA: Diagnosis not present

## 2021-05-06 DIAGNOSIS — R1011 Right upper quadrant pain: Principal | ICD-10-CM

## 2021-05-06 DIAGNOSIS — R109 Unspecified abdominal pain: Secondary | ICD-10-CM | POA: Diagnosis not present

## 2021-05-06 DIAGNOSIS — K7460 Unspecified cirrhosis of liver: Secondary | ICD-10-CM | POA: Diagnosis not present

## 2021-05-06 DIAGNOSIS — E66813 Obesity, class 3: Secondary | ICD-10-CM

## 2021-05-06 DIAGNOSIS — Z79899 Other long term (current) drug therapy: Secondary | ICD-10-CM

## 2021-05-06 DIAGNOSIS — R1084 Generalized abdominal pain: Secondary | ICD-10-CM | POA: Diagnosis not present

## 2021-05-06 DIAGNOSIS — T50904A Poisoning by unspecified drugs, medicaments and biological substances, undetermined, initial encounter: Secondary | ICD-10-CM | POA: Diagnosis not present

## 2021-05-06 DIAGNOSIS — E876 Hypokalemia: Secondary | ICD-10-CM | POA: Diagnosis not present

## 2021-05-06 DIAGNOSIS — K838 Other specified diseases of biliary tract: Secondary | ICD-10-CM | POA: Diagnosis not present

## 2021-05-06 DIAGNOSIS — I10 Essential (primary) hypertension: Secondary | ICD-10-CM | POA: Diagnosis not present

## 2021-05-06 DIAGNOSIS — K7689 Other specified diseases of liver: Secondary | ICD-10-CM | POA: Diagnosis not present

## 2021-05-06 DIAGNOSIS — Z7901 Long term (current) use of anticoagulants: Secondary | ICD-10-CM

## 2021-05-06 DIAGNOSIS — Z7951 Long term (current) use of inhaled steroids: Secondary | ICD-10-CM

## 2021-05-06 DIAGNOSIS — Z833 Family history of diabetes mellitus: Secondary | ICD-10-CM

## 2021-05-06 DIAGNOSIS — Z882 Allergy status to sulfonamides status: Secondary | ICD-10-CM

## 2021-05-06 HISTORY — PX: IR PARACENTESIS: IMG2679

## 2021-05-06 LAB — URINALYSIS, ROUTINE W REFLEX MICROSCOPIC
Bilirubin Urine: NEGATIVE
Glucose, UA: NEGATIVE mg/dL
Hgb urine dipstick: NEGATIVE
Ketones, ur: NEGATIVE mg/dL
Nitrite: NEGATIVE
Protein, ur: NEGATIVE mg/dL
Specific Gravity, Urine: >1.046 — ABNORMAL HIGH (ref 1.005–1.030)
pH: 5 (ref 5.0–8.0)

## 2021-05-06 LAB — CBC WITH DIFFERENTIAL/PLATELET
Abs Immature Granulocytes: 0.01 10*3/uL (ref 0.00–0.07)
Basophils Absolute: 0 10*3/uL (ref 0.0–0.1)
Basophils Relative: 0 %
Eosinophils Absolute: 0.1 10*3/uL (ref 0.0–0.5)
Eosinophils Relative: 3 %
HCT: 35.5 % — ABNORMAL LOW (ref 36.0–46.0)
Hemoglobin: 11.7 g/dL — ABNORMAL LOW (ref 12.0–15.0)
Immature Granulocytes: 0 %
Lymphocytes Relative: 21 %
Lymphs Abs: 0.9 10*3/uL (ref 0.7–4.0)
MCH: 33.6 pg (ref 26.0–34.0)
MCHC: 33 g/dL (ref 30.0–36.0)
MCV: 102 fL — ABNORMAL HIGH (ref 80.0–100.0)
Monocytes Absolute: 0.1 10*3/uL (ref 0.1–1.0)
Monocytes Relative: 3 %
Neutro Abs: 3 10*3/uL (ref 1.7–7.7)
Neutrophils Relative %: 73 %
Platelets: 124 10*3/uL — ABNORMAL LOW (ref 150–400)
RBC: 3.48 MIL/uL — ABNORMAL LOW (ref 3.87–5.11)
RDW: 16.6 % — ABNORMAL HIGH (ref 11.5–15.5)
WBC: 4.1 10*3/uL (ref 4.0–10.5)
nRBC: 0 % (ref 0.0–0.2)

## 2021-05-06 LAB — LACTIC ACID, PLASMA
Lactic Acid, Venous: 5 mmol/L (ref 0.5–1.9)
Lactic Acid, Venous: 4 mmol/L (ref 0.5–1.9)
Lactic Acid, Venous: 3.8 mmol/L (ref 0.5–1.9)
Lactic Acid, Venous: 4 mmol/L (ref 0.5–1.9)

## 2021-05-06 LAB — COMPREHENSIVE METABOLIC PANEL
ALT: 41 U/L (ref 0–44)
AST: 52 U/L — ABNORMAL HIGH (ref 15–41)
Albumin: 2.7 g/dL — ABNORMAL LOW (ref 3.5–5.0)
Alkaline Phosphatase: 143 U/L — ABNORMAL HIGH (ref 38–126)
Anion gap: 13 (ref 5–15)
BUN: 12 mg/dL (ref 8–23)
CO2: 26 mmol/L (ref 22–32)
Calcium: 8.6 mg/dL — ABNORMAL LOW (ref 8.9–10.3)
Chloride: 97 mmol/L — ABNORMAL LOW (ref 98–111)
Creatinine, Ser: 1.4 mg/dL — ABNORMAL HIGH (ref 0.44–1.00)
GFR, Estimated: 42 mL/min — ABNORMAL LOW (ref >60)
Glucose, Bld: 103 mg/dL — ABNORMAL HIGH (ref 70–99)
Potassium: 3.7 mmol/L (ref 3.5–5.1)
Sodium: 136 mmol/L (ref 135–145)
Total Bilirubin: 2.3 mg/dL — ABNORMAL HIGH (ref 0.3–1.2)
Total Protein: 6.4 g/dL — ABNORMAL LOW (ref 6.5–8.1)

## 2021-05-06 LAB — ALBUMIN, PLEURAL OR PERITONEAL FLUID: Albumin, Fluid: <1.5 g/dL

## 2021-05-06 LAB — PROTEIN, PLEURAL OR PERITONEAL FLUID: Total protein, fluid: <3 g/dL

## 2021-05-06 LAB — BODY FLUID CELL COUNT WITH DIFFERENTIAL
Eos, Fluid: 0 %
Lymphs, Fluid: 3 %
Monocyte-Macrophage-Serous Fluid: 9 % — ABNORMAL LOW (ref 50–90)
Neutrophil Count, Fluid: 88 % — ABNORMAL HIGH (ref 0–25)
Total Nucleated Cell Count, Fluid: 10650 cu mm — ABNORMAL HIGH (ref 0–1000)

## 2021-05-06 LAB — LIPASE, BLOOD: Lipase: 33 U/L (ref 11–51)

## 2021-05-06 LAB — PROTIME-INR
INR: 1.4 — ABNORMAL HIGH (ref 0.8–1.2)
Prothrombin Time: 17.3 seconds — ABNORMAL HIGH (ref 11.4–15.2)

## 2021-05-06 LAB — APTT: aPTT: 66 seconds — ABNORMAL HIGH (ref 24–36)

## 2021-05-06 MED ORDER — PANTOPRAZOLE SODIUM 40 MG PO TBEC
40.0000 mg | DELAYED_RELEASE_TABLET | Freq: Every day | ORAL | Status: DC
  Administered 2021-05-07 – 2021-05-08 (×2): 40 mg via ORAL
  Filled 2021-05-06 (×2): qty 1

## 2021-05-06 MED ORDER — SODIUM CHLORIDE 0.9 % IV SOLN
INTRAVENOUS | Status: DC
Start: 2021-05-06 — End: 2021-05-06

## 2021-05-06 MED ORDER — ONDANSETRON 4 MG PO TBDP
8.0000 mg | ORAL_TABLET | Freq: Once | ORAL | Status: DC
Start: 2021-05-06 — End: 2021-05-06
  Filled 2021-05-06: qty 2

## 2021-05-06 MED ORDER — TRAMADOL HCL 50 MG PO TABS
25.0000 mg | ORAL_TABLET | Freq: Three times a day (TID) | ORAL | Status: DC | PRN
  Administered 2021-05-07 – 2021-05-11 (×3): 25 mg via ORAL
  Filled 2021-05-06 (×4): qty 1

## 2021-05-06 MED ORDER — HYDROCORTISONE 20 MG PO TABS
20.0000 mg | ORAL_TABLET | Freq: Every day | ORAL | Status: DC
Start: 2021-05-07 — End: 2021-05-08
  Administered 2021-05-07: 20 mg via ORAL
  Filled 2021-05-06 (×2): qty 1

## 2021-05-06 MED ORDER — LACTATED RINGERS IV BOLUS
1000.0000 mL | Freq: Once | INTRAVENOUS | Status: AC
Start: 2021-05-06 — End: 2021-05-06
  Administered 2021-05-06: 1000 mL via INTRAVENOUS

## 2021-05-06 MED ORDER — PIPERACILLIN-TAZOBACTAM 3.375 G IVPB
3.3750 g | Freq: Three times a day (TID) | INTRAVENOUS | Status: DC
  Administered 2021-05-06 – 2021-05-07 (×3): 3.375 g via INTRAVENOUS
  Filled 2021-05-06 (×4): qty 50

## 2021-05-06 MED ORDER — LEVETIRACETAM 500 MG PO TABS
500.0000 mg | ORAL_TABLET | Freq: Two times a day (BID) | ORAL | Status: DC
Start: 2021-05-06 — End: 2021-05-12
  Administered 2021-05-07 – 2021-05-11 (×10): 500 mg via ORAL
  Filled 2021-05-06 (×10): qty 1

## 2021-05-06 MED ORDER — ACETAMINOPHEN 500 MG PO TABS
1000.0000 mg | ORAL_TABLET | Freq: Once | ORAL | Status: DC
  Filled 2021-05-06: qty 2

## 2021-05-06 MED ORDER — NYSTATIN 100000 UNIT/ML MT SUSP
4.0000 mL | Freq: Four times a day (QID) | OROMUCOSAL | Status: DC
  Administered 2021-05-07 – 2021-05-10 (×3): 400000 [IU] via ORAL
  Filled 2021-05-06 (×13): qty 5

## 2021-05-06 MED ORDER — ONDANSETRON HCL 4 MG/2ML IJ SOLN
4.0000 mg | Freq: Once | INTRAMUSCULAR | Status: AC
  Administered 2021-05-06: 4 mg via INTRAVENOUS
  Filled 2021-05-06: qty 2

## 2021-05-06 MED ORDER — MORPHINE SULFATE (PF) 4 MG/ML IV SOLN
4.0000 mg | Freq: Once | INTRAVENOUS | Status: AC
  Administered 2021-05-06: 4 mg via INTRAVENOUS
  Filled 2021-05-06: qty 1

## 2021-05-06 MED ORDER — PRIMIDONE 50 MG PO TABS
100.0000 mg | ORAL_TABLET | Freq: Every morning | ORAL | Status: DC
  Administered 2021-05-07 – 2021-05-11 (×5): 100 mg via ORAL
  Filled 2021-05-06 (×5): qty 2

## 2021-05-06 MED ORDER — SODIUM CHLORIDE 0.9% FLUSH
3.0000 mL | Freq: Two times a day (BID) | INTRAVENOUS | Status: DC
  Administered 2021-05-06 – 2021-05-11 (×9): 3 mL via INTRAVENOUS

## 2021-05-06 MED ORDER — ALPRAZOLAM 0.25 MG PO TABS
0.5000 mg | ORAL_TABLET | Freq: Two times a day (BID) | ORAL | Status: DC | PRN
  Administered 2021-05-07 – 2021-05-10 (×6): 0.5 mg via ORAL
  Filled 2021-05-06 (×6): qty 2

## 2021-05-06 MED ORDER — HYDROCORTISONE SOD SUC (PF) 100 MG IJ SOLR
100.0000 mg | Freq: Once | INTRAMUSCULAR | Status: AC
  Administered 2021-05-06: 100 mg via INTRAVENOUS
  Filled 2021-05-06: qty 2

## 2021-05-06 MED ORDER — LIDOCAINE HCL 1 % IJ SOLN
INTRAMUSCULAR | Status: AC
  Filled 2021-05-06: qty 20

## 2021-05-06 MED ORDER — HYDROCORTISONE NICU INJ SYRINGE 50 MG/ML
100.0000 mg | Freq: Once | INTRAVENOUS | Status: DC
Start: 2021-05-06 — End: 2021-05-06

## 2021-05-06 MED ORDER — LACTATED RINGERS IV SOLN
INTRAVENOUS | Status: AC
Start: 2021-05-06 — End: 2021-05-07

## 2021-05-06 MED ORDER — HEPARIN (PORCINE) 25000 UT/250ML-% IV SOLN
1300.0000 [IU]/h | INTRAVENOUS | Status: DC
  Administered 2021-05-06 – 2021-05-07 (×2): 1350 [IU]/h via INTRAVENOUS
  Filled 2021-05-06 (×2): qty 250

## 2021-05-06 MED ORDER — HYDROMORPHONE HCL 1 MG/ML IJ SOLN
1.0000 mg | Freq: Once | INTRAMUSCULAR | Status: AC
  Administered 2021-05-06: 1 mg via INTRAVENOUS
  Filled 2021-05-06: qty 1

## 2021-05-06 MED ORDER — HEPARIN BOLUS VIA INFUSION
4000.0000 [IU] | Freq: Once | INTRAVENOUS | Status: AC
  Administered 2021-05-06: 4000 [IU] via INTRAVENOUS
  Filled 2021-05-06: qty 4000

## 2021-05-06 MED ORDER — IOHEXOL 350 MG/ML SOLN
100.0000 mL | Freq: Once | INTRAVENOUS | Status: AC | PRN
  Administered 2021-05-06: 100 mL via INTRAVENOUS

## 2021-05-06 MED ORDER — MIDODRINE HCL 5 MG PO TABS
10.0000 mg | ORAL_TABLET | Freq: Three times a day (TID) | ORAL | Status: DC
  Administered 2021-05-07 – 2021-05-11 (×15): 10 mg via ORAL
  Filled 2021-05-06 (×14): qty 2

## 2021-05-06 MED ORDER — MOMETASONE FURO-FORMOTEROL FUM 200-5 MCG/ACT IN AERO
2.0000 | INHALATION_SPRAY | Freq: Two times a day (BID) | RESPIRATORY_TRACT | Status: DC
Start: 2021-05-06 — End: 2021-05-12
  Administered 2021-05-07 – 2021-05-11 (×9): 2 via RESPIRATORY_TRACT
  Filled 2021-05-06 (×2): qty 8.8

## 2021-05-06 NOTE — Progress Notes (Signed)
ANTICOAGULATION CONSULT NOTE - Initial Consult ? ?Pharmacy Consult for Heparin ?Indication:  VTE Treatment ? ?Allergies  ?Allergen Reactions  ? Sulfa Antibiotics Anaphylaxis  ? Prednisone Other (See Comments)  ?  hallucinations  ? Nsaids Other (See Comments)  ?  Stomach pain, diarrhea  ? Other Other (See Comments)  ?  Steroids: hallucinations  ? ? ?Patient Measurements: ?Height: '5\' 1"'$  (154.9 cm) ?Weight: 113.9 kg (251 lb) ?IBW/kg (Calculated) : 47.8 ?Heparin Dosing Weight: 76 kg ? ?Vital Signs: ?Temp: 98.4 ?F (36.9 ?C) (05/04 1021) ?Temp Source: Oral (05/04 1021) ?BP: 107/48 (05/04 1021) ?Pulse Rate: 105 (05/04 1021) ? ?Labs: ?Recent Labs  ?  05/06/21 ?2202  ?HGB 11.7*  ?HCT 35.5*  ?PLT 124*  ?APTT 66*  ?LABPROT 17.3*  ?INR 1.4*  ?CREATININE 1.40*  ? ? ?Estimated Creatinine Clearance: 46.9 mL/min (A) (by C-G formula based on SCr of 1.4 mg/dL (H)). ? ? ?Medical History: ?Past Medical History:  ?Diagnosis Date  ? Arthritis   ? Depression   ? History of kidney stones   ? Hypertension   ? ? ?Medications:  ?(Not in a hospital admission) ? ?Scheduled:  ? [START ON 05/07/2021] hydrocortisone  20 mg Oral Daily  ? hydrocortisone sodium succinate  100 mg Intravenous Once  ? levETIRAcetam  500 mg Oral BID  ? midodrine  10 mg Oral TID WC  ? mometasone-formoterol  2 puff Inhalation BID  ? nystatin  4 mL Oral QID  ? pantoprazole  40 mg Oral Daily  ? primidone  100 mg Oral q morning  ? sodium chloride flush  3 mL Intravenous Q12H  ? ?Infusions:  ? piperacillin-tazobactam (ZOSYN)  IV 3.375 g (05/06/21 1424)  ? ?PRN: ALPRAZolam, traMADol ? ?Assessment: ?12 yof with a history of alcoholic cirrhosis, adrenal insufficiency, HTN, seizure disorder. Patient is presenting with abdominal pain. Patient with recent hospitalization for decompensated cirrhosis and DVT. Heparin per pharmacy consult placed for  DVT . ? ?Patient is on warfarin and lovenox pta -- last doses of both 5/3. ? ?Hgb 11.7; plt 124 ?PT/INR: 17.3/1.4 ? ?Goal of Therapy:   ?Heparin level 0.3-0.7 units/ml ?Monitor platelets by anticoagulation protocol: Yes ?  ?Plan:  ?Give 4000 units bolus x 1 ?Start heparin infusion at 1350 units/hr ?Check anti-Xa level in 8 hours and daily while on heparin ?Continue to monitor H&H and platelets ? ?Lorelei Pont, PharmD, BCPS ?05/06/2021 2:25 PM ?ED Clinical Pharmacist -  [phone] ? ?  ?

## 2021-05-06 NOTE — ED Notes (Signed)
Lactic Acid 5.0 ?

## 2021-05-06 NOTE — ED Triage Notes (Addendum)
Pt BIB [facility] for abd pain x 1 hour with n/v, '4mg'$  of Zofran en route  ?

## 2021-05-06 NOTE — ED Provider Triage Note (Addendum)
Emergency Medicine Provider Triage Evaluation Note ? ?Carla Leonard , a 66 y.o. female  was evaluated in triage.  Pt complains of abdominal pain.  Started acutely at 430 this morning, patient is medical history is notable for decompensated cirrhosis.  She was just discharged from the hospital 04/27/2021.  The pain is upper abdominal and it radiates down into her pelvis.  Pain is constant, associated with nausea and one episode of emesis.  Denies chest pain but does feel short of breath.. ? ?Review of Systems  ?Per HPI ? ?Physical Exam  ?BP (!) 115/47 (BP Location: Left Arm)   Pulse (!) 109   Temp (!) 100.4 ?F (38 ?C) (Oral)   Resp 20   Ht 5' 1"  (1.549 m)   Wt 113.9 kg   SpO2 98%   BMI 47.43 kg/m?  ?Gen:   Awake, no distress   ?Resp:  Tachypneic ?MSK:   Moves extremities without difficulty  ?Other:  Diffuse abdominal pain.  Lower extremity pitting edema bilaterally. ? ?Medical Decision Making  ?Medically screening exam initiated at 6:40 AM.  Appropriate orders placed.  CARMITA BOOM was informed that the remainder of the evaluation will be completed by another provider, this initial triage assessment does not replace that evaluation, and the importance of remaining in the ED until their evaluation is complete. ? ?SIRS criteria met, patient refused Tylenol and Zofran in room. ?  ?Sherrill Raring, PA-C ?05/06/21 3246 ? ?  ?Sherrill Raring, PA-C ?05/06/21 9978 ? ?

## 2021-05-06 NOTE — Progress Notes (Signed)
Pharmacy Antibiotic Note ? ?Carla Leonard is a 66 y.o. female admitted on 05/06/2021 with  intra-abdominal infection .  Pharmacy has been consulted for Zosyn dosing. ? ?WBC 4.1, Tmax 100.4, LA 5>4, RUQ pain, N/V ? ?Of note, patient was recently admitted and discharged on 05/01/21 after new DVT and decompensated cirrhosis.  ? ?Plan: ?Zosyn 3.375g IV q8h (4 hour infusion). ?F/u cultures, LOT, clinical course, and the ability to de-escalate ? ?Height: '5\' 1"'$  (154.9 cm) ?Weight: 113.9 kg (251 lb) ?IBW/kg (Calculated) : 47.8 ? ?Temp (24hrs), Avg:99 ?F (37.2 ?C), Min:98.2 ?F (36.8 ?C), Max:100.4 ?F (38 ?C) ? ?Recent Labs  ?Lab 04/30/21 ?0145 05/01/21 ?0132 05/06/21 ?1610 05/06/21 ?0615 05/06/21 ?1215  ?WBC 5.8 5.3 4.1  --   --   ?CREATININE 1.05*  --  1.40*  --   --   ?LATICACIDVEN  --   --   --  5.0* 4.0*  ?  ?Estimated Creatinine Clearance: 46.9 mL/min (A) (by C-G formula based on SCr of 1.4 mg/dL (H)).   ? ?Allergies  ?Allergen Reactions  ? Sulfa Antibiotics Anaphylaxis  ? Prednisone Other (See Comments)  ?  hallucinations  ? Nsaids Other (See Comments)  ?  Stomach pain, diarrhea  ? Other Other (See Comments)  ?  Steroids: hallucinations  ? ? ?Antimicrobials this admission: ?5/4 zosyn >>  ? ?Dose adjustments this admission: ?N/A ? ?Microbiology results: ?5/4 BCx: sent ? ? ?Thank you for allowing pharmacy to be a part of this patient?s care. ? ?Eduard Clos Alexandrea Westergard ?05/06/2021 2:05 PM ? ?

## 2021-05-06 NOTE — ED Notes (Signed)
Unable to obtain second set of blood cultures prior to abx administration. Attempt unsuccessful.  ?

## 2021-05-06 NOTE — ED Notes (Signed)
Patient transported to IR 

## 2021-05-06 NOTE — Consult Note (Addendum)
? ? ? ? ? Consultation ? ?Referring Provider:   ER ?Primary Care Physician:  Darrol Jump, NP ?Primary Gastroenterologist:  Dr. Lorenso Courier       ?Reason for Consultation:   Acute abdominal pain, CT scan with concern for choledocholithiasis, lactic acid 5 some concern for acute cholangitis and patient known to Dr. Lorenso Courier with cirrhosis. ? ? Impression   ? ?Acute AB pain with concern for choledocholitiasis on CT in setting of cirrhosis, not surgical candidate, a/p cholecystectomy, lactic acidosis ?WBC 4.1 HGB 11.7 Platelets 124 ?AST 52 ALT 41  ?Alkphos 143 TBili 2.3 ?05/06/2021 INR 1.4  ?05/06/2021 Lactic acid 4.0 ?CT : Interval increase of intra and extrahepatic bile ducts, no liver lesions ?Pending MRCP STAT ? ?Alcoholic cirrhosis ?HGB 11.7 Platelets 124 ?AST 52 ALT 41  ?Alkphos 143 TBili 2.3 ?GFR 42  ?INR 05/06/2021 1.4  ?MELD-Na score: 18  ?History of portal hypertension with splenomegaly, ascites, thrombocytopenia ?S/p paracentesis 04/26 ?02/02/2021 Ammonia 33 ?-There is no evidence of encephalopathy ? ?Acute DVT ?On outpatient lovenox SQ last dose was history evening ?Switched to Heparin IV ? ?History of abdominal cellulitis/abscess ?On CT no signs of abscess, some warmth and erythema with possible induration on exam ?Is on Zosyn. ? ?Adrenal insufficiency with hypotension ?Continue medications per primary team ? ?Macrocytic anemia- stable without evidence of bleeding ?More elevated than usual likely hemoconcentration continue to monitor. ?CBC on 05/06/2021   ?WBC 4.1 HGB 11.7 MCV 102.0 Platelets 124 ?Anemia studies on 03/29/2021  ?Iron 94 Ferritin 398.7 B12 578 ? ?History of seizures ?On Keppra ? ?Acute on chronic kidney stage 3 - baseline appears to be around 1.0, at 1.4 ?BUN 12 Cr 1.40 - slightly increased ?GFR 42  ?Potassium 3.7  Magnesium 1.9  ?Monitor ? ? ? Plan  ? ?-Will monitor CBC and CMET ?-Monitor INR daily ?-Continue supportive care ?-Continue pain control. ?-Continue IV hydration ?--On Zosyn  currently ?-Patient very poor surgical candidate, getting MRCP stat.   ?-If positive for choledocholithiasis we will try to arrange for ERCP.   ?-If patient gets progressively worse could consider IR consultation for percutaneous drainage as mentioned patient is a very poor surgical candidate ?-Keep patient n.p.o. until after MRCP, would need to discuss with physician about potentially holding heparin for procedure. ?-Patient had paracentesis 04/26, has ascites on exam, will plan for diagnostic paracentesis. ?-will get diagnostic paracentesis to rule out SBP--Max limit is 2 L ?-Please send for cell count with differential, albumin concentration, total protein concentration.  ? ?Thank you for your kind consultation, we will continue to follow. ?       ? HPI:   ?Carla Leonard is a 66 y.o. female with past medical history significant for seizures, thrombocytopenia, adrenal insufficiency, hypertension, CKD stage III and alcoholic cirrhosis, history of abdominal wall cellulitis and abscess patient recently seen in the ER 04/08/2021 for decompensated cirrhosis and new DVT.   ?Patient status post cholecystectomy at age 11.   ?At that time patient was having auto paracentesis from umbilical hernia, on Lovenox injections, not surgical candidate, supposed to follow-up at our clinic outpatient however presented to the ER with acute onset abdominal pain 4:30 AM. ? ?Patient states the day before she was having a very good day no abdominal pain, no fever chills, no nausea vomiting. ?She woke up abruptly 415 with stabbing abdominal pain.  States the pain was everywhere patient denies any radiation to her back, shoulders.   ?Patient denies nausea but did have vomiting in the ambulance. ?  Denies fever or chills but does state she states she is colder than normal. ?Patient has been giving herself Lovenox injections at home, has had some fluid leaking from Lovenox injections/umbilicus per patient. ?Patient's had some darker urine in  the morning but normally clears up.  Does states she had decreased urinary output yesterday and while taking her Lasix. ?Last bowel movement was yesterday, brown well-formed denies melena or hematochezia ? ?Febrile, slightly tachycardic when she arrived. ? ?Abnormal ED labs: ?CTA AB and pelvis no acute vascular abnormality, interval worsening of intra and extrahepatic bile duct dilatation suggest evaluation MRCP to exclude distal obstructing stone, stricture or mass.  Cirrhotic liver morphology without focal hepatic lesion, mild abdominal ascites ?Patient's febrile, slightly tachy with soft blood pressures in the ER. ?Tmax 100.4 ?Hemoglobin 11.7, platelets 124 ?Creatinine 1.40 ?AST 52, alk phos 143, total bilirubin 2.3 ?INR 1.4 ?Lactic acid 5 ?Pending blood cultures ? ?GI history: ?03/29/2021 LFT work-up: Work-up showed patient was immune against hepatitis A, iron levels were slightly high, and HFE was ordered to see if iron overload was hereditary in nature or due to inflammation, other labs normal ?03/29/2021 office visit with Dr. Lorenso Courier: At that time discussed a recent admission 1/30-02/05/2021 for abdominal wall cellulitis and abscess for which she was treated with antibiotics, this had improved at that time however she did note scant amounts of bleeding from her bellybutton, discussed recent diagnosis of adrenal insufficiency but was no longer taking Hydrocortisone; discussed another admission 2/9-2/13/2023 for a fall resulting in a new nondisplaced periprosthetic distal femoral metaphysis fracture treated with nonoperative management; plan: At that time discussed colon cancer screening with a colonoscopy but she declined, she wanted to pursue Cologuard, had work-up in regards to elevated LFTs and discussed history of significant alcohol use which could have led to alcoholic liver disease, she was also referred to wound care for care of her umbilicus ?02/01/2021 CT AP with contrast: Showed small enhancing fluid  collections in the subcutaneous tissues at the level of the umbilicus measuring 3.1 x 0.9 x 2.2 cm, anterior abdominal wall subcutaneous stranding and skin thickening which may be related to cellulitis, significant decrease in ascites, stable splenomegaly and aortic atherosclerosis ?04/08/2021 CT abdomen pelvis shows ascites with fluid draining through the umbilical hernia and features concerning for cirrhosis,  ?04/28/2021 2.4 L paracentesis-albumin less than 1.5 from portal hypertension, negative Gram stain no organisms seen, no no cell count to rule out SBP ?05/06/21 CTA AB and pelvis no acute vascular abnormality, interval worsening of intra and extrahepatic bile duct dilatation suggest evaluation MRCP to exclude distal obstructing stone, stricture or mass.  Cirrhotic liver morphology without focal hepatic lesion, mild abdominal ascites  ? ? ? ?Past Medical History:  ?Diagnosis Date  ? Arthritis   ? Depression   ? History of kidney stones   ? Hypertension   ? ? ?Surgical History:  ?She  has a past surgical history that includes Joint replacement; Knee Arthroplasty (06/17/2011); Appendectomy; Cholecystectomy; Elbow surgery; PERIANAL CYST; and IR Paracentesis (04/28/2021). ?Family History:  ?Her family history includes Diabetes in her father; Pneumonia in her mother. ?Social History:  ? reports that she has been smoking cigarettes. She has been smoking an average of .25 packs per day. She does not have any smokeless tobacco history on file. She reports current alcohol use. She reports that she does not use drugs. ? ?Prior to Admission medications   ?Medication Sig Start Date End Date Taking? Authorizing Provider  ?ALPRAZolam (XANAX) 0.5 MG tablet  Take 1 tablet by mouth 2 times a day as needed ?Patient taking differently: Take 0.5 mg by mouth 2 (two) times daily as needed for anxiety. 03/05/21  Yes   ?budesonide-formoterol (SYMBICORT) 160-4.5 MCG/ACT inhaler Inhale 2 puffs into the lungs 2 (two) times daily as needed  (shortness of breath/wheezing). 01/20/21  Yes [provider]  ?enoxaparin (LOVENOX) 120 MG/0.8ML injection Inject 0.8 mLs (120 mg total) into the skin every 12 (twelve) hours. 04/30/21  Yes Thurnell Lose, MD

## 2021-05-06 NOTE — ED Provider Notes (Signed)
?[facility] ?Provider Note ? ?CSN: [id number] ?Arrival date & time: 05/06/21 0540 ? ?Chief Complaint(s) ?Abdominal Pain ? ?HPI ?Carla Carla is a 66 y.o. female with PMH alcoholic cirrhosis, adrenal insufficiency, CKD 3, anxiety, depression, HTN, seizure disorder who presents emergency department for evaluation of abdominal pain.  Patient recently discharged on 05/01/2021 after a 4-day hospital stay for decompensated cirrhosis and a new DVT.  Patient is currently on Lovenox injections.  She states that she awoke this morning at 4:30 AM with acute onset right upper quadrant abdominal pain and associated nausea and vomiting.  Patient arrives febrile to 100.4 and appears very uncomfortable.  Denies chest pain, shortness of breath, headache, diarrhea or other systemic symptoms. ? ? ?Past Medical History ?Past Medical History:  ?Diagnosis Date  ? Arthritis   ? Depression   ? History of kidney stones   ? Hypertension   ? ?Patient Active Problem List  ? Diagnosis Date Noted  ? Volume overload 04/27/2021  ? Alcoholic cirrhosis of liver with ascites ([place]) 04/07/2021  ? Fall at home, initial encounter 02/12/2021  ? Closed bicondylar fracture of distal femur, left, initial encounter ([place]) 02/11/2021  ? Adrenal insufficiency ([place]) 02/05/2021  ? Cellulitis of abdominal wall 02/03/2021  ? Heme positive stool 02/03/2021  ? Ascites 02/03/2021  ? History of alcohol abuse 02/03/2021  ? Seizure disorder ([place]) 02/03/2021  ? Hypotension 02/02/2021  ? Lactic acidosis 02/02/2021  ? CKD (chronic kidney disease), stage III ([place]) 02/02/2021  ? Hyperbilirubinemia 02/02/2021  ? Hypoalbuminemia 02/02/2021  ? Thrombocytopenia ([place]) 02/02/2021  ? Hematoma 06/15/2015  ? Essential hypertension 06/15/2015  ? Acute kidney injury ([place]) 06/15/2015  ? Anxiety and depression 06/15/2015  ? Cellulitis of right lower extremity   ? Hypokalemia   ? Renal insufficiency   ? Cellulitis 06/14/2015  ? Cellulitis of right leg  06/14/2015  ? Osteoarthritis of left knee 06/17/2011  ? Ganglion of right wrist 06/17/2011  ? ?Home Medication(s) ?Prior to Admission medications   ?Medication Sig Start Date End Date Taking? Authorizing Provider  ?ALPRAZolam (XANAX) 0.5 MG tablet Take 1 tablet by mouth 2 times a day as needed ?Patient taking differently: Take 0.5 mg by mouth 2 (two) times daily as needed for anxiety. 03/05/21     ?budesonide-formoterol (SYMBICORT) 160-4.5 MCG/ACT inhaler Inhale 2 puffs into the lungs 2 (two) times daily as needed (shortness of breath/wheezing). 01/20/21   [provider]  ?enoxaparin (LOVENOX) 120 MG/0.8ML injection Inject 0.8 mLs (120 mg total) into the skin every 12 (twelve) hours. 04/30/21   Thurnell Lose, MD  ?feeding supplement (BOOST HIGH PROTEIN) LIQD Take 1 Container by mouth 2 (two) times daily as needed (appetite).    [provider]  ?furosemide (LASIX) 20 MG tablet Take 1 tablet (20 mg total) by mouth daily. ?Patient taking differently: Take 80 mg by mouth daily. 04/12/21   Thurnell Lose, MD  ?hydrocortisone (CORTEF) 10 MG tablet Take 2 tablets (20 mg total) by mouth daily.  Do not stop it abruptly unless told by the endocrinologist. 04/12/21   Thurnell Lose, MD  ?levETIRAcetam (KEPPRA) 500 MG tablet Take 1 tablet by mouth 2 times a day 08/21/20     ?midodrine (PROAMATINE) 10 MG tablet Take 1 tablet (10 mg total) by mouth 3 (three) times daily with meals. 04/12/21   Thurnell Lose, MD  ?nystatin (MYCOSTATIN) 100000 UNIT/ML suspension Take 4 mLs by mouth 4 (four) times daily. 04/20/21   [provider]  ?pantoprazole (PROTONIX) 40 MG tablet Take 1 tablet (40 mg total) by mouth daily. 05/02/21   Thurnell Lose, MD  ?potassium chloride (KLOR-CON M) 10 MEQ tablet Take 20 mEq by mouth daily. 04/23/21   [provider]  ?primidone (MYSOLINE) 50 MG tablet Take 100 mg by mouth every morning. 01/21/21   [provider]  ?spironolactone (ALDACTONE) 50 MG tablet  Take 1 tablet (50 mg total) by mouth daily. 05/02/21   Thurnell Lose, MD  ?traMADol (ULTRAM) 50 MG tablet Take 50 mg by mouth every 8 (eight) hours as needed for moderate pain. 03/25/21   [provider]  ?traZODone (DESYREL) 100 MG tablet Take 100 mg by mouth at bedtime as needed for sleep. ?Patient not taking: Reported on 04/27/2021 01/20/21   [provider]  ?warfarin (COUMADIN) 2.5 MG tablet Take 1 tablet (2.5 mg total) by mouth daily. 05/01/21 05/01/22  Thurnell Lose, MD  ?                                                                                                                                  ?Past Surgical History ?Past Surgical History:  ?Procedure Laterality Date  ? APPENDECTOMY    ? CHOLECYSTECTOMY    ? ELBOW SURGERY    ? RIGHT  ? IR PARACENTESIS  04/28/2021  ? JOINT REPLACEMENT    ? right TKA 06/2010  ? KNEE ARTHROPLASTY  06/17/2011  ? Procedure: COMPUTER ASSISTED TOTAL KNEE ARTHROPLASTY;  Surgeon: Alta Corning, MD;  Location: [facility];  Service: Orthopedics;  Laterality: Left;  TOTAL KNEE REPLACEMENT WITH GENERAL ANESTHESIA AND PRE OP FEMORAL NERVE BLOCK  ? PERIANAL CYST    ? ?Family History ?Family History  ?Problem Relation Age of Onset  ? Pneumonia Mother   ? Diabetes Father   ? Colon cancer Neg Hx   ? Esophageal cancer Neg Hx   ? Stomach cancer Neg Hx   ? Pancreatic cancer Neg Hx   ? Colon polyps Neg Hx   ? ? ?Social History ?Social History  ? ?Tobacco Use  ? Smoking status: Every Day  ?  Packs/day: 0.25  ?  Types: Cigarettes  ? Tobacco comments:  ?  Former 2 ppd  ?Vaping Use  ? Vaping Use: Never used  ?Substance Use Topics  ? Alcohol use: Yes  ? Drug use: No  ? ?Allergies ?Sulfa antibiotics, Prednisone, Nsaids, and Other ? ?Review of Systems ?Review of Systems  ?Constitutional:  Positive for fever.  ?Gastrointestinal:  Positive for abdominal pain, nausea and vomiting.  ? ?Physical Exam ?Vital Signs  ?I have reviewed the triage vital signs ?BP (!) 107/48 (BP Location: Right  Arm)   Pulse (!) 105   Temp 98.4 ?F (36.9 ?C) (Oral)   Resp 17   Ht '5\' 1"'$  (1.549 m)   Wt 113.9 kg   SpO2 99%   BMI 47.43 kg/m?  ? ?Physical  Exam ?Vitals and nursing note reviewed.  ?Constitutional:   ?   General: She is not in acute distress. ?   Appearance: She is well-developed. She is ill-appearing.  ?HENT:  ?   Head: Normocephalic and atraumatic.  ?Eyes:  ?   Conjunctiva/sclera: Conjunctivae normal.  ?Cardiovascular:  ?   Rate and Rhythm: Normal rate and regular rhythm.  ?   Heart sounds: No murmur heard. ?Pulmonary:  ?   Effort: Pulmonary effort is normal. No respiratory distress.  ?   Breath sounds: Normal breath sounds.  ?Abdominal:  ?   General: A surgical scar is present. There is distension.  ?   Palpations: Abdomen is soft.  ?   Tenderness: There is abdominal tenderness in the right upper quadrant.  ?Musculoskeletal:     ?   General: No swelling.  ?   Cervical back: Neck supple.  ?Skin: ?   General: Skin is warm and dry.  ?   Capillary Refill: Capillary refill takes less than 2 seconds.  ?Neurological:  ?   Mental Status: She is alert.  ?Psychiatric:     ?   Mood and Affect: Mood normal.  ? ? ?ED Results and Treatments ?Labs ?(all labs ordered are listed, but only abnormal results are displayed) ?Labs Reviewed  ?CBC WITH DIFFERENTIAL/PLATELET - Abnormal; Notable for the following components:  ?    Result Value  ? RBC 3.48 (*)   ? Hemoglobin 11.7 (*)   ? HCT 35.5 (*)   ? MCV 102.0 (*)   ? RDW 16.6 (*)   ? Platelets 124 (*)   ? All other components within normal limits  ?COMPREHENSIVE METABOLIC PANEL - Abnormal; Notable for the following components:  ? Chloride 97 (*)   ? Glucose, Bld 103 (*)   ? Creatinine, Ser 1.40 (*)   ? Calcium 8.6 (*)   ? Total Protein 6.4 (*)   ? Albumin 2.7 (*)   ? AST 52 (*)   ? Alkaline Phosphatase 143 (*)   ? Total Bilirubin 2.3 (*)   ? GFR, Estimated 42 (*)   ? All other components within normal limits  ?PROTIME-INR - Abnormal; Notable for the following components:  ?  Prothrombin Time 17.3 (*)   ? INR 1.4 (*)   ? All other components within normal limits  ?APTT - Abnormal; Notable for the following components:  ? aPTT 66 (*)   ? All other components within normal limits  ?LACTI

## 2021-05-06 NOTE — H&P (Addendum)
?History and Physical  ? ? ?Patient: Carla Leonard OEV:[id number] DOB: 04/22/1955 ?DOA: 05/06/2021 ?DOS: the patient was seen and examined on 05/06/2021 ?PCP: Darrol Jump, NP  ?Patient coming from: Home via EMS ? ?Chief Complaint:  ?Chief Complaint  ?Patient presents with  ? Abdominal Pain  ? ?HPI: Carla Leonard is a 66 y.o. female with medical history significant of hypertension, cirrhosis, adrenal insufficiency, CKD stage IIIa, anxiety, depression, and seizure disorder who presents with complaints of abdominal pain.  The patient had just recently been hospitalized from 4/25-4/29 with complaints of swelling found to have acute left lower extremity DVT and in the setting of decompensated cirrhosis.  Patient was started on Coumadin with Lovenox injections to bridge until INR therapeutic and had paracentesis.  Since getting home patient had been doing okay up until this morning around 4:15-4:30 AM when she was woken out of her sleep with complaints of severe epigastric abdominal pain.  At the time she thought she was dying because she has never had pain like this previously before.  Patient notes she was tender to palpation along the midline of her abdomen.  In route with EMS patient reported having nausea and episode of vomiting.  She has been having normal bowel movements and denies seeing any blood present in her stools.  Her gallbladder has been removed and her 38s. ? ?On admission into the emergency department patient was noted to be febrile up to 10.4 ?F with heart rates 10 5-1 09, blood pressures 91/47- 115/47, and all other vital signs maintained.  Labs significant for hemoglobin 11.7, platelets 124 BUN 12, creatinine 1.4, alkaline phosphatase 143, AST 52, total bilirubin 2.3, INR 1.4, and lactic acid 5->4.  Blood cultures were obtained.  CT angiogram revealed interval worsening of intra and extrahepatic bile duct dilatation which MRCP was recommended.  Patient was bolused 1 L of lactated Ringer's,  morphine, Dilaudid, IV, and started on empiric antibiotics of Zosyn.  GI have been formally consulted.  MRCP was pending. ? ?Review of Systems: As mentioned in the history of present illness. All other systems reviewed and are negative. ?Past Medical History:  ?Diagnosis Date  ? Arthritis   ? Depression   ? History of kidney stones   ? Hypertension   ? ?Past Surgical History:  ?Procedure Laterality Date  ? APPENDECTOMY    ? CHOLECYSTECTOMY    ? ELBOW SURGERY    ? RIGHT  ? IR PARACENTESIS  04/28/2021  ? JOINT REPLACEMENT    ? right TKA 06/2010  ? KNEE ARTHROPLASTY  06/17/2011  ? Procedure: COMPUTER ASSISTED TOTAL KNEE ARTHROPLASTY;  Surgeon: Alta Corning, MD;  Location: [facility];  Service: Orthopedics;  Laterality: Left;  TOTAL KNEE REPLACEMENT WITH GENERAL ANESTHESIA AND PRE OP FEMORAL NERVE BLOCK  ? PERIANAL CYST    ? ?Social History:  reports that she has been smoking cigarettes. She has been smoking an average of .25 packs per day. She does not have any smokeless tobacco history on file. She reports current alcohol use. She reports that she does not use drugs. ? ?Allergies  ?Allergen Reactions  ? Sulfa Antibiotics Anaphylaxis  ? Prednisone Other (See Comments)  ?  hallucinations  ? Nsaids Other (See Comments)  ?  Stomach pain, diarrhea  ? Other Other (See Comments)  ?  Steroids: hallucinations  ? ? ?Family History  ?Problem Relation Age of Onset  ? Pneumonia Mother   ? Diabetes Father   ? Colon cancer Neg Hx   ?  Esophageal cancer Neg Hx   ? Stomach cancer Neg Hx   ? Pancreatic cancer Neg Hx   ? Colon polyps Neg Hx   ? ? ?Prior to Admission medications   ?Medication Sig Start Date End Date Taking? Authorizing Provider  ?ALPRAZolam (XANAX) 0.5 MG tablet Take 1 tablet by mouth 2 times a day as needed ?Patient taking differently: Take 0.5 mg by mouth 2 (two) times daily as needed for anxiety. 03/05/21  Yes   ?budesonide-formoterol (SYMBICORT) 160-4.5 MCG/ACT inhaler Inhale 2 puffs into the lungs 2 (two) times daily as  needed (shortness of breath/wheezing). 01/20/21  Yes [provider]  ?enoxaparin (LOVENOX) 120 MG/0.8ML injection Inject 0.8 mLs (120 mg total) into the skin every 12 (twelve) hours. 04/30/21  Yes Thurnell Lose, MD  ?feeding supplement (BOOST HIGH PROTEIN) LIQD Take 1 Container by mouth 2 (two) times daily as needed (appetite).   Yes [provider]  ?furosemide (LASIX) 20 MG tablet Take 1 tablet (20 mg total) by mouth daily. ?Patient taking differently: Take 80 mg by mouth daily. 04/12/21  Yes Thurnell Lose, MD  ?hydrocortisone (CORTEF) 10 MG tablet Take 2 tablets (20 mg total) by mouth daily.  Do not stop it abruptly unless told by the endocrinologist. 04/12/21  Yes Thurnell Lose, MD  ?levETIRAcetam (KEPPRA) 500 MG tablet Take 1 tablet by mouth 2 times a day ?Patient taking differently: Take 500 mg by mouth 2 (two) times daily. 08/21/20  Yes   ?midodrine (PROAMATINE) 10 MG tablet Take 1 tablet (10 mg total) by mouth 3 (three) times daily with meals. 04/12/21  Yes Thurnell Lose, MD  ?nystatin (MYCOSTATIN) 100000 UNIT/ML suspension Take 4 mLs by mouth 4 (four) times daily. 04/20/21  Yes [provider]  ?pantoprazole (PROTONIX) 40 MG tablet Take 1 tablet (40 mg total) by mouth daily. 05/02/21  Yes Thurnell Lose, MD  ?potassium chloride (KLOR-CON M) 10 MEQ tablet Take 20 mEq by mouth daily. 04/23/21  Yes [provider]  ?primidone (MYSOLINE) 50 MG tablet Take 100 mg by mouth every morning. 01/21/21  Yes [provider]  ?spironolactone (ALDACTONE) 50 MG tablet Take 1 tablet (50 mg total) by mouth daily. 05/02/21  Yes Thurnell Lose, MD  ?traMADol (ULTRAM) 50 MG tablet Take 25 mg by mouth every 8 (eight) hours as needed for moderate pain. 03/25/21  Yes [provider]  ?warfarin (COUMADIN) 2.5 MG tablet Take 1 tablet (2.5 mg total) by mouth daily. 05/01/21 05/01/22 Yes Thurnell Lose, MD  ?traZODone (DESYREL) 100 MG tablet Take 100 mg by mouth at  bedtime as needed for sleep. ?Patient not taking: Reported on 04/27/2021 01/20/21   [provider]  ? ? ?Physical Exam: ?Vitals:  ? 05/06/21 0542 05/06/21 0548 05/06/21 0851 05/06/21 1021  ?BP:  (!) 115/47 (!) 91/47 (!) 107/48  ?Pulse:  (!) 109 (!) 108 (!) 105  ?Resp:  '20 16 17  '$ ?Temp:  (!) 100.4 ?F (38 ?C) 98.2 ?F (36.8 ?C) 98.4 ?F (36.9 ?C)  ?TempSrc:  Oral Oral Oral  ?SpO2:  98% 95% 99%  ?Weight: 113.9 kg     ?Height: '5\' 1"'$  (1.549 m)     ? ?Constitutional: Obese elderly female currently in no acute distress ?Eyes: PERRL, lids and conjunctivae normal ?ENMT: Mucous membranes are moist.  Posterior pharynx clear of any exudate or lesions.  ?Neck: normal, supple, no masses, no thyromegaly ?Respiratory: clear to auscultation bilaterally, no wheezing, no crackles. Normal respiratory effort. No  accessory muscle use.  ?Cardiovascular: Mildly tachycardic. At least 2+ pitting bilateral lower extremity edema appreciated on physical exam ?Abdomen: Protuberant abdomen with epigastric/ right upper quadrant tenderness appreciated.  Bowel sounds present. ?Musculoskeletal: no clubbing / cyanosis. No joint deformity upper and lower extremities.  ?Skin: Bruising noted of the bilateral upper extremities with blood blister of the left forearm. ?Neurologic: CN 2-12 grossly intact.  . Strength 5/5 in all 4.  ?Psychiatric: Normal judgment and insight. Alert and oriented x 3. Normal mood.  ? ? ? ?Data Reviewed: ? ?Reviewed labs, imaging, and pertinent records as noted above in HPI. ? ?Assessment and Plan: ? ?Choledocholithiasis ?Acute.  Patient presented and was noted complaint of abdominal pain with nausea and vomiting.  Initially found to be febrile up to 100.4 ?F with tachycardia, but did not meet SIRS criteria.  Labs significant for acute elevations of alkaline phosphatase 143, AST 53, and total bilirubin 2.3.  CT angiogram noted intra and extrahepatic biliary duct dilatation.  Blood cultures were obtained and patient had been  started on empiric antibiotics of Zosyn.  On the differential includes the possibility of cholangitis given fever or SBP. ?-Admit to a medical telemetry bed ?-Follow-up blood cultures ?-Follow-up diagnostic parace

## 2021-05-07 DIAGNOSIS — A419 Sepsis, unspecified organism: Secondary | ICD-10-CM | POA: Insufficient documentation

## 2021-05-07 DIAGNOSIS — K652 Spontaneous bacterial peritonitis: Secondary | ICD-10-CM

## 2021-05-07 DIAGNOSIS — K8050 Calculus of bile duct without cholangitis or cholecystitis without obstruction: Secondary | ICD-10-CM | POA: Diagnosis not present

## 2021-05-07 DIAGNOSIS — K7031 Alcoholic cirrhosis of liver with ascites: Secondary | ICD-10-CM | POA: Diagnosis not present

## 2021-05-07 LAB — HEPATIC FUNCTION PANEL
ALT: 27 U/L (ref 0–44)
AST: 31 U/L (ref 15–41)
Albumin: 1.9 g/dL — ABNORMAL LOW (ref 3.5–5.0)
Alkaline Phosphatase: 90 U/L (ref 38–126)
Bilirubin, Direct: 0.8 mg/dL — ABNORMAL HIGH (ref 0.0–0.2)
Indirect Bilirubin: 1.5 mg/dL — ABNORMAL HIGH (ref 0.3–0.9)
Total Bilirubin: 2.3 mg/dL — ABNORMAL HIGH (ref 0.3–1.2)
Total Protein: 4.6 g/dL — ABNORMAL LOW (ref 6.5–8.1)

## 2021-05-07 LAB — CBC
HCT: 27.1 % — ABNORMAL LOW (ref 36.0–46.0)
HCT: 23.5 % — ABNORMAL LOW (ref 36.0–46.0)
Hemoglobin: 9.3 g/dL — ABNORMAL LOW (ref 12.0–15.0)
Hemoglobin: 8 g/dL — ABNORMAL LOW (ref 12.0–15.0)
MCH: 34.2 pg — ABNORMAL HIGH (ref 26.0–34.0)
MCH: 34 pg (ref 26.0–34.0)
MCHC: 34.3 g/dL (ref 30.0–36.0)
MCHC: 34 g/dL (ref 30.0–36.0)
MCV: 99.6 fL (ref 80.0–100.0)
MCV: 100 fL (ref 80.0–100.0)
Platelets: 105 10*3/uL — ABNORMAL LOW (ref 150–400)
Platelets: 97 10*3/uL — ABNORMAL LOW (ref 150–400)
RBC: 2.72 MIL/uL — ABNORMAL LOW (ref 3.87–5.11)
RBC: 2.35 MIL/uL — ABNORMAL LOW (ref 3.87–5.11)
RDW: 16.9 % — ABNORMAL HIGH (ref 11.5–15.5)
RDW: 16.9 % — ABNORMAL HIGH (ref 11.5–15.5)
WBC: 6.9 10*3/uL (ref 4.0–10.5)
WBC: 6.4 10*3/uL (ref 4.0–10.5)
nRBC: 0 % (ref 0.0–0.2)
nRBC: 0 % (ref 0.0–0.2)

## 2021-05-07 LAB — COMPREHENSIVE METABOLIC PANEL
ALT: 33 U/L (ref 0–44)
AST: 38 U/L (ref 15–41)
Albumin: 2.2 g/dL — ABNORMAL LOW (ref 3.5–5.0)
Alkaline Phosphatase: 100 U/L (ref 38–126)
Anion gap: 8 (ref 5–15)
BUN: 18 mg/dL (ref 8–23)
CO2: 28 mmol/L (ref 22–32)
Calcium: 8.2 mg/dL — ABNORMAL LOW (ref 8.9–10.3)
Chloride: 98 mmol/L (ref 98–111)
Creatinine, Ser: 1.45 mg/dL — ABNORMAL HIGH (ref 0.44–1.00)
GFR, Estimated: 40 mL/min — ABNORMAL LOW (ref >60)
Glucose, Bld: 140 mg/dL — ABNORMAL HIGH (ref 70–99)
Potassium: 3.8 mmol/L (ref 3.5–5.1)
Sodium: 134 mmol/L — ABNORMAL LOW (ref 135–145)
Total Bilirubin: 2.8 mg/dL — ABNORMAL HIGH (ref 0.3–1.2)
Total Protein: 5.2 g/dL — ABNORMAL LOW (ref 6.5–8.1)

## 2021-05-07 LAB — HEPARIN LEVEL (UNFRACTIONATED)
Heparin Unfractionated: 0.77 IU/mL — ABNORMAL HIGH (ref 0.30–0.70)
Heparin Unfractionated: 0.62 IU/mL (ref 0.30–0.70)

## 2021-05-07 LAB — BASIC METABOLIC PANEL
Anion gap: 8 (ref 5–15)
BUN: 20 mg/dL (ref 8–23)
CO2: 29 mmol/L (ref 22–32)
Calcium: 7.9 mg/dL — ABNORMAL LOW (ref 8.9–10.3)
Chloride: 97 mmol/L — ABNORMAL LOW (ref 98–111)
Creatinine, Ser: 1.34 mg/dL — ABNORMAL HIGH (ref 0.44–1.00)
GFR, Estimated: 44 mL/min — ABNORMAL LOW (ref >60)
Glucose, Bld: 112 mg/dL — ABNORMAL HIGH (ref 70–99)
Potassium: 3.4 mmol/L — ABNORMAL LOW (ref 3.5–5.1)
Sodium: 134 mmol/L — ABNORMAL LOW (ref 135–145)

## 2021-05-07 LAB — LACTIC ACID, PLASMA
Lactic Acid, Venous: 1.2 mmol/L (ref 0.5–1.9)
Lactic Acid, Venous: 1.9 mmol/L (ref 0.5–1.9)

## 2021-05-07 LAB — PROTIME-INR
INR: 1.8 — ABNORMAL HIGH (ref 0.8–1.2)
Prothrombin Time: 20.3 seconds — ABNORMAL HIGH (ref 11.4–15.2)

## 2021-05-07 MED ORDER — WARFARIN - PHARMACIST DOSING INPATIENT: Freq: Every day | Status: DC

## 2021-05-07 MED ORDER — SODIUM CHLORIDE 0.9 % IV BOLUS
500.0000 mL | Freq: Once | INTRAVENOUS | Status: AC
  Administered 2021-05-07: 500 mL via INTRAVENOUS

## 2021-05-07 MED ORDER — GADOBUTROL 1 MMOL/ML IV SOLN
10.0000 mL | Freq: Once | INTRAVENOUS | Status: AC | PRN
  Administered 2021-05-07: 10 mL via INTRAVENOUS

## 2021-05-07 MED ORDER — LACTATED RINGERS IV BOLUS
1000.0000 mL | Freq: Once | INTRAVENOUS | Status: AC
  Administered 2021-05-07: 1000 mL via INTRAVENOUS

## 2021-05-07 MED ORDER — ALBUMIN HUMAN 5 % IV SOLN
25.0000 g | Freq: Three times a day (TID) | INTRAVENOUS | Status: AC
  Administered 2021-05-07 – 2021-05-08 (×2): 25 g via INTRAVENOUS
  Filled 2021-05-07 (×2): qty 500

## 2021-05-07 MED ORDER — WARFARIN SODIUM 4 MG PO TABS
4.0000 mg | ORAL_TABLET | Freq: Once | ORAL | Status: AC
Start: 2021-05-07 — End: 2021-05-07
  Administered 2021-05-07: 4 mg via ORAL
  Filled 2021-05-07: qty 1

## 2021-05-07 MED ORDER — SODIUM CHLORIDE 0.9 % IV SOLN
2.0000 g | INTRAVENOUS | Status: DC
  Administered 2021-05-07 – 2021-05-11 (×5): 2 g via INTRAVENOUS
  Filled 2021-05-07 (×5): qty 20

## 2021-05-07 MED ORDER — ALBUMIN HUMAN 5 % IV SOLN
25.0000 g | Freq: Four times a day (QID) | INTRAVENOUS | Status: DC
  Administered 2021-05-07: 25 g via INTRAVENOUS
  Filled 2021-05-07 (×3): qty 500

## 2021-05-07 NOTE — Progress Notes (Signed)
Mobility Specialist Progress Note: ? ? 05/07/21 1059  ?Mobility  ?Activity Ambulated independently to bathroom  ?Level of Assistance Independent  ?Assistive Device None  ?Distance Ambulated (ft) 30 ft  ?Activity Response Tolerated well  ?$Mobility charge 1 Mobility  ? ?Pt received with bed alarm going off. Pt ambulated to BR with IV pole. Left in bathroom with all needs met.  ? ?Merideth Bosque ?Mobility Specialist ?Primary Phone [phone] ? ?

## 2021-05-07 NOTE — Progress Notes (Signed)
ANTICOAGULATION CONSULT NOTE  ?Pharmacy Consult for Heparin ?Indication:  VTE Treatment ?Brief A/P: Heparin level slightly supratherapeutic Continue Heparin at current rate for now ? ?Allergies  ?Allergen Reactions  ? Sulfa Antibiotics Anaphylaxis  ? Prednisone Other (See Comments)  ?  hallucinations  ? Nsaids Other (See Comments)  ?  Stomach pain, diarrhea  ? Other Other (See Comments)  ?  Steroids: hallucinations  ? ? ?Patient Measurements: ?Height: '5\' 1"'$  (154.9 cm) ?Weight: 113.9 kg (251 lb) ?IBW/kg (Calculated) : 47.8 ?Heparin Dosing Weight: 76 kg ? ?Vital Signs: ?Temp: 98.6 ?F (37 ?C) (05/05 0015) ?Temp Source: Oral (05/05 0015) ?BP: 105/52 (05/05 0015) ?Pulse Rate: 83 (05/05 0015) ? ?Labs: ?Recent Labs  ?  05/06/21 ?8563 05/07/21 ?0030  ?HGB 11.7*  --   ?HCT 35.5*  --   ?PLT 124*  --   ?APTT 66*  --   ?LABPROT 17.3* 20.3*  ?INR 1.4* 1.8*  ?HEPARINUNFRC  --  0.77*  ?CREATININE 1.40* 1.45*  ? ? ? ?Estimated Creatinine Clearance: 45.3 mL/min (A) (by C-G formula based on SCr of 1.45 mg/dL (H)). ? ?Assessment: ?65 y.o. female with h/o recent DVT, Coumadin and Lovenox on hold, for heparin.  Previously therapeutic at this rate during prior admission last week.  Heparin level slightly above goal, though could be elevated due to Lovenox ? ?Goal of Therapy:  ?Heparin level 0.3-0.7 units/ml ?Monitor platelets by anticoagulation protocol: Yes ?  ?Plan:  ?Continue Heparin at current rate for now.  ?Follow-up am labs and adjust if needed at that time ? ?Phillis Knack, PharmD, BCPS ?  ? ?  ?

## 2021-05-07 NOTE — Progress Notes (Signed)
?PROGRESS NOTE ? ? ? ?Patient: Carla Leonard                            PCP: Darrol Jump, NP                    ?DOB: 1955/10/04            DOA: 05/06/2021 ?YKD:[id number]             DOS: 05/07/2021, 11:38 AM ? ? LOS: 1 day  ? ?Date of Service: The patient was seen and examined on 05/07/2021 ? ?Subjective:  ? ?The patient was seen and examined this morning. ?Stable at this time. ?Reporting improved abdominal discomfort, improved abdominal distention post paracentesis ?Complaining of blisters on the left arm, ?Denies any fever chest pain shortness of breath dizziness ? ?Blood pressure as low as 84/51 overnight currently 99/36 satting 99% on room air ? ?Brief Narrative:  ? ? ?TARALYN FERRAIOLO is a 66 y.o. female with medical history significant of hypertension, cirrhosis, adrenal insufficiency, CKD stage IIIa, anxiety, depression, and seizure disorder who presents with complaints of abdominal pain.  The patient had just recently been hospitalized from 4/25-4/29 with complaints of swelling found to have acute left lower extremity DVT and in the setting of decompensated cirrhosis.  Patient was started on Coumadin with Lovenox injections to bridge until INR therapeutic and had paracentesis.  ? ?Since getting home patient had been doing okay up until this morning around 4:15-4:30 AM when she was woken out of her sleep with complaints of severe epigastric abdominal pain.  At the time she thought she was dying because she has never had pain like this previously before.  Patient notes she was tender to palpation along the midline of her abdomen.  In route with EMS patient reported having nausea and episode of vomiting.  She has been having normal bowel movements and denies seeing any blood present in her stools.  Her gallbladder has been removed and her 68s. ?  ?ED: ?Patient was noted to be febrile up to 100.4 ?F with HR 105-109, PB 91/47- 115/47, and all other vital signs maintained.  Labs significant for hemoglobin 11.7,  platelets 124 BUN 12, creatinine 1.4, alkaline phosphatase 143, AST 52, total bilirubin 2.3, INR 1.4, and lactic acid 5->4.  Blood cultures were obtained.  CT angiogram revealed interval worsening of intra and extrahepatic bile duct dilatation which MRCP was recommended.  Patient was bolused 1 L of lactated Ringer's, morphine, Dilaudid, IV, and started on empiric antibiotics of Zosyn.  GI have been formally consulted.   ? ?MRCP >>> S/Pcholecystectomy with moderate intra and ?extrahepatic biliary ductal dilatation, favored to reflect benign ?post cholecystectomy physiology. No choledocholithiasis or other ?source of obstruction identified. ? very small ampullary mass is not excluded, ? but not strongly favored on the basis of today's examination. Further evaluation  ?with endoscopic ultrasound/ERCP could be considered if clinically appropriate. ?2. Cirrhosis. ?3. Small volume of ascites. ? ?EXAM: ?ULTRASOUND GUIDED LEFT LOWER QUADRANT PARACENTESIS >>  3 Ls  ? ? ? ?Assessment & Plan:  ? ?Principal Problem: ?  Choledocholithiasis ?Active Problems: ?  Acute kidney injury superimposed on chronic kidney disease ([place]) ?  Lactic acidosis ?  History of DVT (deep vein thrombosis) ?  Subtherapeutic international normalized ratio (INR) ?  Anxiety and depression ?  Hypotension ?  Hyperbilirubinemia ?  Thrombocytopenia ([place]) ?  Seizure disorder ([place]) ?  Adrenal  insufficiency ([place]) ?  Alcoholic cirrhosis of liver with ascites ([place]) ?  SBP (spontaneous bacterial peritonitis) ([place]) ? ? ? ? ?Assessment and Plan: ? ? ?Choledocholithiasis ?-Based on imaging ruled out ?-CT and MRCP was completed no evidence of choledocholithiasis, ?Status post paracentesis, clear 3 L was extracted ? ?Possible SBP (spontaneous bacterial peritonitis) ?Per GI and initial fluid from paracentesis --revealing moderate WBCs predominantly PMN ? ?Acute.  Patient presented and was noted complaint of abdominal pain with nausea and vomiting.  Initially found to be  febrile up to 100.4 ?F with tachycardia, ? ?Patient meets SIRS criteria this morning with hypotension, ?-Afebrile, hypotensive, heart rate 108, WBC 6.4, lactic acid 1.9,  ?labs significant for acute elevations of alkaline phosphatase 143, AST 53, and total bilirubin 2.3.   ?-Was initially started on empiric antibiotics of Zosyn. ... Per GI recommendation we will switch to IV Rocephin 2 g daily  ? ?-Follow-up blood cultures ?-Follow-up peritoneal cultures ?-Continue empiric antibiotics; Zosyn, changed to IV Rocephin ?-GI following: Recommending close monitoring hemodynamically replace albumin follow-up cultures considering outpatient EUS ?  ?Lactic acidosis ?Acute.  On admission lactic acid elevated up to 5 with repeat check 4 after receiving IV fluids. ?-Hold diuretics ?-Lactated Ringer's at IV fluids at 100 mL/h for 1 L ?-Lactic acid 3.8, 4.0, 1.2, 1.9 ?  ?Acute kidney injury superimposed on chronic kidney disease stage IIIa ?Patient presents with creatinine of 1.4 baseline creatinine previously 1.05.   ?-Continue to monitor kidney function daily ?-Creatinine 1.45, 1.34 now ?  ?History of left leg DVT with subtherapeutic INR ?During last hospitalization patient was noted to have concern for DVT started on Coumadin with Lovenox.  INR is subtherapeutic at 1.4. ?-Heparin per pharmacy in case of need of procedure ?  ?Alcoholic cirrhosis   /with ascites ?-Status post paracentesis yielding 3 L of fluids, ?Peritoneal fluid sent for cultures ? ?Patient has not drank alcohol in years.  Home medication regimen includes furosemide and spironolactone.  Patient with significant lower extremity swelling appreciated on physical exam.  ? possibility of SBP. ?-Held furosemide and spironolactone in the acute setting resume when medically appropriate -also hypotensive ?-Repleting albumin  ?-Serum albumin 2.2, 1.9, ?  ?Chronic hypotension adrenal insufficiency ?Home medication regimen includes midodrine 10 mg 3 times daily and  hydrocortisone 20 mg daily. ?-Give hydrocortisone if 100 mg IV x1 dose ?-Continue midodrine ?-Restart home dose of Cortef tomorrow ?  ?Macrocytic anemia ?Hemoglobin 11.7 which appears higher than baseline on 4/29 of 9.7.  Suspect secondary to hemoconcentration. ?-Continue to monitor with fluid rehydration. ?  ?Transaminitis and hyperbilirubinemia ?Acute on chronic. Alkaline phosphatase 143, AST 53, and total bilirubin 2.3. ?-Continue to monitor ?  ?History of seizure disorder ?Home medication regimen includes Keppra 500 mg twice daily and primidone 100 mg daily. ?-Continue current medication regimen ?  ?Anxiety and depression ?-Continue Xanax as needed ?  ?Thrombocytopenia ?Chronic.  Platelet count 124.  Secondary to process. ?-Continue to monitor ?  ?GERD ?-Continue Protonix ?  ?History of lower extremity DVT ?-Based on previous admission-due to contraindication of other anticoagulation with her seizure medications and current status, patient was started on Coumadin currently on hold on heparin drip ?-Continue heparin drip ? ? ?----------------------------------------------------------------------------------------------------------------- ?Nutritional status:  ?The patient's BMI is: Body mass index is 43.82 kg/m?. ?I agree with the assessment and plan as outlined ---------------------------------------------------------------------------------------------------------------- ?Cultures; ?Abdominal fluid cultures>>> ? ?------------------------------------------------------------------------------------------------------------ ? ?DVT prophylaxis:  ?  On heparin drip (holding home dose Coumadin) ? ? ?Code Status:   Code  Status: Full Code ? ?Family Communication: Discussed with patient ?No family member present at bedside- attempt will be made to update daily ?The above findings and plan of care has been discussed with patient (and family)  in detail,  ?they expressed understanding and agreement of above. ?-Advance care  planning has been discussed.  ? ?Disposition : From home likely back home with home PT in 2 days ?admission status:   ?Status is: Inpatient ?Remains inpatient appropriate because: Needing GI evaluation, IV

## 2021-05-07 NOTE — Progress Notes (Signed)
?   05/07/21 0533  ?Vitals  ?Temp 98.5 ?F (36.9 ?C)  ?Temp Source Oral  ?BP (!) 84/51  ?MAP (mmHg) (!) 62  ?BP Location Left Arm  ?BP Method Automatic  ?Patient Position (if appropriate) Lying  ?Pulse Rate 76  ?Pulse Rate Source Monitor  ?Resp 17  ?MEWS COLOR  ?MEWS Score Color Green  ?Oxygen Therapy  ?SpO2 95 %  ?O2 Device Room Air  ?Height and Weight  ?Weight 105.2 kg  ?BMI (Calculated) 43.84  ?MEWS Score  ?MEWS Temp 0  ?MEWS Systolic 1  ?MEWS Pulse 0  ?MEWS RR 0  ?MEWS LOC 0  ?MEWS Score 1  ?Provider Notification  ?Provider Name/Title Dr Hal Hope  ?Date Provider Notified 05/07/21  ?Time Provider Notified [phone]  ?Method of Notification Call  ?Notification Reason Other (Comment)  ?Provider response See new orders  ?Date of Provider Response 05/07/21  ?Time of Provider Response [phone]  ? ? ?

## 2021-05-07 NOTE — Progress Notes (Addendum)
ANTICOAGULATION CONSULT NOTE ? ?Pharmacy Consult for Heparin ?Indication:   DVT ? ?Allergies  ?Allergen Reactions  ? Sulfa Antibiotics Anaphylaxis  ? Prednisone Other (See Comments)  ?  hallucinations  ? Nsaids Other (See Comments)  ?  Stomach pain, diarrhea  ? Other Other (See Comments)  ?  Steroids: hallucinations  ? ? ?Patient Measurements: ?Height: '5\' 1"'$  (154.9 cm) ?Weight: 105.2 kg (231 lb 14.8 oz) ?IBW/kg (Calculated) : 47.8 ?Heparin Dosing Weight: 76 kg ? ?Vital Signs: ?Temp: 98.4 ?F (36.9 ?C) (05/05 0830) ?Temp Source: Oral (05/05 0830) ?BP: 99/36 (05/05 0830) ?Pulse Rate: 82 (05/05 0830) ? ?Labs: ?Recent Labs  ?  05/06/21 ?8413 05/07/21 ?0030 05/07/21 ?2440 05/07/21 ?0957  ?HGB 11.7* 9.3* 8.0*  --   ?HCT 35.5* 27.1* 23.5*  --   ?PLT 124* 105* 97*  --   ?APTT 66*  --   --   --   ?LABPROT 17.3* 20.3*  --   --   ?INR 1.4* 1.8*  --   --   ?HEPARINUNFRC  --  0.77*  --  0.62  ?CREATININE 1.40* 1.45* 1.34*  --   ? ? ? ?Estimated Creatinine Clearance: 46.8 mL/min (A) (by C-G formula based on SCr of 1.34 mg/dL (H)). ? ? ?Assessment: ?15 yof with a history of alcoholic cirrhosis, adrenal insufficiency, HTN and seizure disorder presented with abdominal pain. Patient with recent hospitalization for decompensated cirrhosis and DVT.  She was on Lovenox bridge to Coumadin outpatient, last dose on 5/3.  Pharmacy consulted to transition Lovenox to IV heparin and resume Coumadin. ? ?Heparin level therapeutic at 0.62 units/mL.  INR slightly below goal at 1.8.   ?CBC trending down; no bleeding reported. ?PTA Coumadin dose: 2.'5mg'$  PO daily ? ?Goal of Therapy:  ?Heparin level 0.3-0.7 units/ml ?INR 2-3 ?Monitor platelets by anticoagulation protocol: Yes ?  ?Plan:  ?Reduce heparin infusion slightly to 1300 units/hr ?Coumadin '4mg'$  PO today ?Daily heparin level, PT / INR and CBC ? ?Rayhana Slider D. Mina Marble, PharmD, BCPS, BCCCP ?05/07/2021, 12:45 PM ? ?

## 2021-05-07 NOTE — Progress Notes (Addendum)
? ? ? ? Progress Note ? ? Subjective  ?Patient feeling better this AM. Abdominal pain much better. She is hungry and wants to eat. MRCP done overnight, paracentesis also done - she has SBP. ? ? ? Objective  ? ?Vital signs in last 24 hours: ?Temp:  [98.2 ?F (36.8 ?C)-98.6 ?F (37 ?C)] 98.5 ?F (36.9 ?C) (05/05 0533) ?Pulse Rate:  [69-108] 69 (05/05 0600) ?Resp:  [10-19] 18 (05/05 0600) ?BP: (84-133)/(47-62) 93/49 (05/05 0600) ?SpO2:  [93 %-100 %] 97 % (05/05 0600) ?Weight:  [105.2 kg] 105.2 kg (05/05 0533) ?Last BM Date : 05/05/21 ?General:    white female in NAD ?Abdomen:  Soft, mild LLQ tenderness, some ascites  ?Neurologic:  Alert and oriented,  grossly normal neurologically. ?Psych:  Cooperative. Normal mood and affect. ? ?Intake/Output from previous day: ?05/04 0701 - 05/05 0700 ?In: 999 [IV Piggyback:999] ?Out: -  ?Intake/Output this shift: ?No intake/output data recorded. ? ?Lab Results: ?Recent Labs  ?  05/06/21 ?2330 05/07/21 ?0030  ?WBC 4.1 6.9  ?HGB 11.7* 9.3*  ?HCT 35.5* 27.1*  ?PLT 124* 105*  ? ?BMET ?Recent Labs  ?  05/06/21 ?0762 05/07/21 ?0030  ?NA 136 134*  ?K 3.7 3.8  ?CL 97* 98  ?CO2 26 28  ?GLUCOSE 103* 140*  ?BUN 12 18  ?CREATININE 1.40* 1.45*  ?CALCIUM 8.6* 8.2*  ? ?LFT ?Recent Labs  ?  05/07/21 ?0030  ?PROT 5.2*  ?ALBUMIN 2.2*  ?AST 38  ?ALT 33  ?ALKPHOS 100  ?BILITOT 2.8*  ? ?PT/INR ?Recent Labs  ?  05/06/21 ?2633 05/07/21 ?0030  ?LABPROT 17.3* 20.3*  ?INR 1.4* 1.8*  ? ? ?Studies/Results: ?MR ABDOMEN MRCP W WO CONTAST ? ?Result Date: 05/07/2021 ?CLINICAL DATA:  66 year old female with history of right upper quadrant abdominal pain. Biliary dilatation. Evaluate for potential choledocholithiasis. EXAM: MRI ABDOMEN WITHOUT AND WITH CONTRAST (INCLUDING MRCP) TECHNIQUE: Multiplanar multisequence MR imaging of the abdomen was performed both before and after the administration of intravenous contrast. Heavily T2-weighted images of the biliary and pancreatic ducts were obtained, and three-dimensional  MRCP images were rendered by post processing. CONTRAST:  7m GADAVIST GADOBUTROL 1 MMOL/ML IV SOLN COMPARISON:  No prior abdominal MRI. CT of the abdomen and pelvis 05/06/2021. FINDINGS: Lower chest: Unremarkable. Hepatobiliary: There are no suspicious cystic or solid hepatic lesions. Liver has a shrunken appearance and nodular contour, indicative of advanced cirrhosis. Patient is status post cholecystectomy. There is moderate intra and extrahepatic biliary ductal dilatation. Common bile duct measures up to 14 mm in the porta hepatis. No filling defect within the common bile duct to suggest the presence of choledocholithiasis. The common bile duct is dilated all the way to the level of the ampulla. No definite ampullary mass confidently identified on today's examination. Pancreas: No pancreatic mass. No pancreatic ductal dilatation. Increased T2 signal intensity is noted in the peripancreatic soft tissues, suggesting pancreatic/peripancreatic inflammation. No well-defined peripancreatic fluid collections to suggest the presence of pseudocyst or abscess at this time. Spleen:  Unremarkable. Adrenals/Urinary Tract: Multiple subcentimeter T1 hypointense, T2 hyperintense, nonenhancing renal lesions, compatible with simple cysts. No hydroureteronephrosis in the visualized portions of the abdomen. Bilateral adrenal glands are normal in appearance. Stomach/Bowel: Visualized portions are unremarkable. Vascular/Lymphatic: No aneurysm identified in the visualized abdominal vasculature. No lymphadenopathy noted in the abdomen. Other:  Small volume of ascites. Musculoskeletal: No aggressive appearing osseous lesions are noted in the visualized portions of the skeleton. Increased T2 signal intensity in the body wall indicative of body wall edema.  IMPRESSION: 1. Patient is status post cholecystectomy with moderate intra and extrahepatic biliary ductal dilatation, favored to reflect benign post cholecystectomy physiology. No  choledocholithiasis or other source of obstruction identified. The possibility of a very small ampullary mass is not excluded, but not strongly favored on the basis of today's examination. Further evaluation with endoscopic ultrasound/ERCP could be considered if clinically appropriate. 2. Cirrhosis. 3. Small volume of ascites. Electronically Signed   By: Vinnie Langton M.D.   On: 05/07/2021 05:46  ? ?CT Angio Abd/Pel w/ and/or w/o ? ?Result Date: 05/06/2021 ?CLINICAL DATA:  Abdominal pain Decompensated cirrhosis Nausea Emesis EXAM: CTA ABDOMEN AND PELVIS WITHOUT AND WITH CONTRAST TECHNIQUE: Multidetector CT imaging of the abdomen and pelvis was performed using the standard protocol during bolus administration of intravenous contrast. Multiplanar reconstructed images and MIPs were obtained and reviewed to evaluate the vascular anatomy. RADIATION DOSE REDUCTION: This exam was performed according to the departmental dose-optimization program which includes automated exposure control, adjustment of the mA and/or kV according to patient size and/or use of iterative reconstruction technique. CONTRAST:  129m OMNIPAQUE IOHEXOL 350 MG/ML SOLN COMPARISON:  CT abdomen pelvis 04/27/2021 FINDINGS: VASCULAR Aorta: Normal caliber aorta without aneurysm, dissection, vasculitis or significant stenosis. Celiac: Patent without evidence of aneurysm, dissection, vasculitis or significant stenosis. SMA: Patent without evidence of aneurysm, dissection, vasculitis or significant stenosis. Renals: Both renal arteries are patent without evidence of aneurysm, dissection, vasculitis, fibromuscular dysplasia or significant stenosis. IMA: Patent. Inflow: Patent without evidence of aneurysm, dissection, vasculitis or significant stenosis. Proximal Outflow: Bilateral common femoral and visualized portions of the superficial and profunda femoral arteries are patent without evidence of aneurysm, dissection, vasculitis or significant stenosis.  Veins: Hepatic, portal, superior mesenteric, and splenic veins are normal. Review of the MIP images confirms the above findings. NON-VASCULAR Lower chest: No acute abnormality. Hepatobiliary: Postsurgical changes of cholecystectomy. Cirrhotic liver morphology without focal hepatic lesion. Interval worsening of intra and extrahepatic bile duct dilatation with common bile duct measuring up to 14 mm. Pancreas: Unremarkable. No pancreatic ductal dilatation or surrounding inflammatory changes. Spleen: Normal in size without focal abnormality. Adrenals/Urinary Tract: Multiple subcentimeter renal hypodensities are too small to fully characterize, however statistically most likely relate to simple cysts. No significant abnormality of the adrenal glands, ureters, or bladder. Stomach/Bowel: No bowel dilatation to indicate ileus or obstruction. Appendix is not definitively visualized. Lymphatic: No enlarged abdominal or pelvic lymph nodes. Mildly prominent pericardial lymph node is not significantly changed in size dating back to 11/06/2019 which indicates a benign etiology. Reproductive: Uterus and bilateral adnexa are unremarkable. Other: Diffuse body wall edema.  Mild abdominal ascites. Musculoskeletal: Slight deformity of the inferior endplate of L2 is unchanged from prior examination. Vertebral body heights are otherwise maintained. No acute osseous abnormality. IMPRESSION: VASCULAR 1. No acute vascular abnormality of the abdomen or pelvis. NON-VASCULAR 1. Interval worsening of intra and extrahepatic bile duct dilatation. Consider further evaluation with MRI/MRCP when patient condition allows to exclude distal obstructing stone, stricture, or mass. 2. Cirrhotic liver morphology without focal hepatic lesion. 3. Mild abdominal ascites. Electronically Signed   By: FMiachel RouxM.D.   On: 05/06/2021 13:23  ? ?IR Paracentesis ? ?Result Date: 05/06/2021 ?INDICATION: Cirrhosis with ascites. Abdominal discomfort. Request for  diagnostic and therapeutic paracentesis up to 3 L max. EXAM: ULTRASOUND GUIDED LEFT LOWER QUADRANT PARACENTESIS MEDICATIONS: 1% plain lidocaine, 5 mL COMPLICATIONS: None immediate. PROCEDURE: Informed written consent wa

## 2021-05-07 NOTE — Hospital Course (Addendum)
66 year old F with PMH of alcoholic cirrhosis with ascites, adrenal insufficiency, cholecystectomy in her 25s, CKD-3A, seizure disorder, anxiety, depression, morbid obesity, thrombocytopenia and recent diagnosis of LLE DVT on warfarin returning with acute severe epigastric abdominal pain with associated nausea and vomiting and admitted for severe sepsis due to possible SBP.  In ED, she was febrile to 100.4 with mild tachycardia and hypotension.  Hgb 11.7.  Platelet 124. Cr 1.4.  ALP 143.  AST 52.  Total bili 2.3.  INR 1.4.  Lactic acid 5.4>> 4.0.  Blood cultures obtained.  CT with interval worsening of intra and extrahepatic bile duct dilation.  MRCP recommended.  Started on IV fluid and IV Zosyn.  GI consulted. ? 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  Patient underwent US-guided paracentesis with removal of 3 L.  Fluid study concerning for SBP.  Antibiotics de-escalated to IV ceftriaxone.  GI recommended IV CTX for 5 days and discharged on p.o. Cipro for SBP prophylaxis.  Patient has upcoming appointment at [facility] on 5/10. ? ?Hospital course complicated by significant left arm bloody bulla/hematoma and bruising.  Anticoagulation discontinued.  IR consulted for IVC filter, and recommended repeat LE venous Doppler before committing patient to IVC filter.  LE venous Doppler negative.  IVC filter deferred. ? ? ?

## 2021-05-07 NOTE — TOC CM/SW Note (Signed)
?  Transition of Care (TOC) Screening Note ? ? ?Patient Details  ?Name: Carla Leonard ?Date of Birth: 1956-01-01 ? ? ? ?Transition of Care Department [facility]) has reviewed patient and no TOC needs have been identified at this time. We will continue to monitor patient advancement through interdisciplinary progression rounds. If new patient transition needs arise, please place a TOC consult. ?  ?

## 2021-05-08 ENCOUNTER — Inpatient Hospital Stay (HOSPITAL_COMMUNITY): Payer: Medicare HMO

## 2021-05-08 DIAGNOSIS — A419 Sepsis, unspecified organism: Secondary | ICD-10-CM | POA: Diagnosis not present

## 2021-05-08 DIAGNOSIS — K7031 Alcoholic cirrhosis of liver with ascites: Secondary | ICD-10-CM | POA: Diagnosis not present

## 2021-05-08 DIAGNOSIS — K652 Spontaneous bacterial peritonitis: Secondary | ICD-10-CM

## 2021-05-08 DIAGNOSIS — I10 Essential (primary) hypertension: Secondary | ICD-10-CM

## 2021-05-08 DIAGNOSIS — Z86718 Personal history of other venous thrombosis and embolism: Secondary | ICD-10-CM

## 2021-05-08 DIAGNOSIS — E876 Hypokalemia: Secondary | ICD-10-CM

## 2021-05-08 DIAGNOSIS — R6520 Severe sepsis without septic shock: Secondary | ICD-10-CM

## 2021-05-08 DIAGNOSIS — D62 Acute posthemorrhagic anemia: Secondary | ICD-10-CM | POA: Diagnosis not present

## 2021-05-08 DIAGNOSIS — E8809 Other disorders of plasma-protein metabolism, not elsewhere classified: Secondary | ICD-10-CM

## 2021-05-08 DIAGNOSIS — N179 Acute kidney failure, unspecified: Secondary | ICD-10-CM | POA: Diagnosis not present

## 2021-05-08 LAB — CBC
HCT: 23.3 % — ABNORMAL LOW (ref 36.0–46.0)
HCT: 23.1 % — ABNORMAL LOW (ref 36.0–46.0)
Hemoglobin: 7.5 g/dL — ABNORMAL LOW (ref 12.0–15.0)
Hemoglobin: 7.6 g/dL — ABNORMAL LOW (ref 12.0–15.0)
MCH: 33.3 pg (ref 26.0–34.0)
MCH: 33.5 pg (ref 26.0–34.0)
MCHC: 32.2 g/dL (ref 30.0–36.0)
MCHC: 32.9 g/dL (ref 30.0–36.0)
MCV: 103.6 fL — ABNORMAL HIGH (ref 80.0–100.0)
MCV: 101.8 fL — ABNORMAL HIGH (ref 80.0–100.0)
Platelets: 95 10*3/uL — ABNORMAL LOW (ref 150–400)
Platelets: 98 10*3/uL — ABNORMAL LOW (ref 150–400)
RBC: 2.25 MIL/uL — ABNORMAL LOW (ref 3.87–5.11)
RBC: 2.27 MIL/uL — ABNORMAL LOW (ref 3.87–5.11)
RDW: 17 % — ABNORMAL HIGH (ref 11.5–15.5)
RDW: 16.9 % — ABNORMAL HIGH (ref 11.5–15.5)
WBC: 6.4 10*3/uL (ref 4.0–10.5)
WBC: 5.5 10*3/uL (ref 4.0–10.5)
nRBC: 0 % (ref 0.0–0.2)
nRBC: 0 % (ref 0.0–0.2)

## 2021-05-08 LAB — TYPE AND SCREEN
ABO/RH(D): O POS
Antibody Screen: NEGATIVE

## 2021-05-08 LAB — PATHOLOGIST SMEAR REVIEW

## 2021-05-08 LAB — PROTIME-INR
INR: 1.9 — ABNORMAL HIGH (ref 0.8–1.2)
Prothrombin Time: 21.5 seconds — ABNORMAL HIGH (ref 11.4–15.2)

## 2021-05-08 LAB — HEPARIN LEVEL (UNFRACTIONATED): Heparin Unfractionated: 0.12 IU/mL — ABNORMAL LOW (ref 0.30–0.70)

## 2021-05-08 MED ORDER — LACTULOSE 10 GM/15ML PO SOLN: 30.0000 g | Freq: Two times a day (BID) | ORAL | Status: DC | PRN

## 2021-05-08 MED ORDER — POTASSIUM CHLORIDE CRYS ER 20 MEQ PO TBCR
40.0000 meq | EXTENDED_RELEASE_TABLET | Freq: Once | ORAL | Status: AC
  Administered 2021-05-08: 40 meq via ORAL
  Filled 2021-05-08: qty 2

## 2021-05-08 MED ORDER — ALBUMIN HUMAN 25 % IV SOLN
25.0000 g | Freq: Four times a day (QID) | INTRAVENOUS | Status: AC
  Administered 2021-05-09 (×4): 25 g via INTRAVENOUS
  Filled 2021-05-08 (×4): qty 100

## 2021-05-08 MED ORDER — HYDROCORTISONE SOD SUC (PF) 100 MG IJ SOLR
75.0000 mg | Freq: Two times a day (BID) | INTRAMUSCULAR | Status: DC
  Administered 2021-05-08 (×2): 75 mg via INTRAVENOUS
  Filled 2021-05-08 (×2): qty 2

## 2021-05-08 NOTE — Assessment & Plan Note (Signed)
-  Continue home Xanax ?

## 2021-05-08 NOTE — Significant Event (Signed)
Patient's nurse notified me that patient is bleeding from a large blister on the left forearm actively.  Patient is on anticoagulation for DVT of the left lower extremity which was seen last month involving the left popliteal vein.  Given that the patient is actively bleeding we discussed with patient about holding the anticoagulation for which patient is agreeable at this time.  We are going to hold heparin infusion and Coumadin.  Discussed with patient's nurse and pharmacy.  We will be checking stat CBC and checking serial CBCs.  Type and screen.  Check PT and INR.  Closely monitor the bleeding. ? ?Gean Birchwood ?

## 2021-05-08 NOTE — Assessment & Plan Note (Addendum)
Recent Labs  ?  04/11/21 ?0110 04/12/21 ?4621 04/27/21 ?1716 04/28/21 ?0355 04/29/21 ?0228 04/30/21 ?0145 05/06/21 ?9471 05/07/21 ?0030 05/07/21 ?2527 05/09/21 ?0125  ?BUN '12 18 19 19 20 20 12 18 20 16  '$ ?CREATININE 1.14* 1.36* 1.00 1.06* 1.08* 1.05* 1.40* 1.45* 1.34* 1.11*  ?Baseline Cr about 1.0.  Hepatorenal syndrome? ?-IV albumin as above. ?-Continue home midodrine ?-Avoid nephrotoxic meds ?-Resume home diuretics on discharge if renal function continues to improve ?-Recheck renal function in the morning ? ?

## 2021-05-08 NOTE — Assessment & Plan Note (Signed)
Stable.  Continue home medications. 

## 2021-05-08 NOTE — Assessment & Plan Note (Addendum)
Patient presents with acute epigastric abdominal pain with associated nausea and vomiting.  Imaging with moderate intrahepatic and extrahepatic biliary ductal dilation felt to be benign and physiologic postcholecystectomy.  No choledocholithiasis.  Underwent US-guided paracentesis with removal of 3 L.  Fluid study suggests SBP.  Fluid culture NGTD.  Lactic acidosis resolved. ?-IV Zosyn 5/4-5/5>>> IV ceftriaxone 5/5>>. ?-Wean off stress dose steroid-decreased to 50 mg twice daily ?-GI recs:  ? -IV CTX for 5 days for SBP treatment, then p.o. Cipro for SBP prophylaxis ? -25%?albumin infusion today 1.0?gm / kg?(up to '100mg'$  max dose) ? -Consider EUS outpatient ? -Keep appointment with hepatologist, Dr. Lorenso Courier at the [facility] on 5/10 ?-Choledocholithiasis ruled out. ?

## 2021-05-08 NOTE — Assessment & Plan Note (Signed)
IV albumin per GI ?

## 2021-05-08 NOTE — Assessment & Plan Note (Addendum)
Recent Labs  ?  04/28/21 ?0355 04/29/21 ?0228 04/30/21 ?0145 05/01/21 ?0132 05/06/21 ?4098 05/07/21 ?0030 05/07/21 ?1191 05/08/21 ?0147 05/08/21 ?4782 05/09/21 ?0125  ?HGB 10.5* 10.1* 9.3* 9.7* 11.7* 9.3* 8.0* 7.5* 7.6* 7.9*  ?Notable bleeding in left forearm and bruising in other extremities.  High risk for bleeding due to coagulopathy from liver cirrhosis, thrombocytopenia and anticoagulation ?-Anticoagulation discontinued. ?-Monitor H&H. ? ?

## 2021-05-08 NOTE — Progress Notes (Signed)
?PROGRESS NOTE ? ?Carla Leonard MOQ:[id number] DOB: 09/07/1955  ? ?PCP: Darrol Jump, NP ? ?Patient is from: Home. ? ?DOA: 05/06/2021 LOS: 2 ? ?Chief complaints ?Chief Complaint  ?Patient presents with  ? Abdominal Pain  ?  ? ?Brief Narrative / Interim history: ?66 year old F with PMH of alcoholic cirrhosis with ascites, adrenal insufficiency, cholecystectomy in her 36s, CKD-3A, seizure disorder, anxiety, depression, morbid obesity, thrombocytopenia and recent diagnosis of LLE DVT on warfarin returning with acute severe epigastric abdominal pain with associated nausea and vomiting and admitted for severe sepsis due to possible SBP.  In ED, she was febrile to 100.4 with mild tachycardia and hypotension.  Hgb 11.7.  Platelet 124. Cr 1.4.  ALP 143.  AST 52.  Total bili 2.3.  INR 1.4.  Lactic acid 5.4>> 4.0.  Blood cultures obtained.  CT with interval worsening of intra and extrahepatic bile duct dilation.  MRCP recommended.  Started on IV fluid and IV Zosyn.  GI consulted. ? 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  Patient underwent US-guided paracentesis with removal of 3 L.  Fluid study concerning for SBP.  Antibiotics de-escalated to IV ceftriaxone.  GI recommended IV albumin. ? ?Hospital course complicated by significant left arm bloody bulla/hematoma and bruising.  Anticoagulation discontinued.  IR consulted for IVC filter, and recommended repeat lower extremity venous Doppler.   ? ?Subjective: ?Seen and examined earlier this morning.  Reports rough night from left arm bleeding.  She is very concerned.  She denies nausea, vomiting or abdominal pain.  Denies chest pain, dyspnea or dizziness when she got up to go to the bathroom.  She denies melena or hematochezia. ? ?Objective: ?Vitals:  ? 05/07/21 1616 05/07/21 1950 05/08/21 0505 05/08/21 0802  ?BP: (!) 97/47 116/61 (!) 92/42 (!) 107/44  ?Pulse: 66 71 70  75  ?Resp: '19 18 18 16  '$ ?Temp: 98.2 ?F (36.8 ?C) 98.3 ?F (36.8 ?C) 98.2 ?F (36.8 ?C) 98.9 ?F (37.2 ?C)  ?TempSrc: Oral Oral Oral Oral  ?SpO2: 99% 99% 99% 96%  ?Weight:      ?Height:      ? ? ?Examination: ? ?GENERAL: No apparent distress.  Nontoxic. ?HEENT: MMM.  Vision and hearing grossly intact.  ?NECK: Supple.  No apparent JVD.  ?RESP:  No IWOB.  Fair aeration bilaterally. ?CVS:  RRR. Heart sounds normal.  ?ABD/GI/GU: BS+. Abd soft, NTND.  ?MSK/EXT:  Moves extremities. No apparent deformity. No edema.  ?SKIN: Large bloody bulla over lateral aspect of left forearm.  Bruising in LUE and RLE. ?NEURO: Awake, alert and oriented appropriately.  No apparent focal neuro deficit. ?PSYCH: Calm. Normal affect.  ? ? ? ? ? ? ?Procedures:  ?5/4-IR paracentesis with removal of 3 L ? ?Microbiology summarized: ?Blood cultures NGTD. ?Peritoneal fluid culture NGTD. ? ?Assessment and Plan: ?* Severe sepsis due to SBP ?Patient presents with acute epigastric abdominal pain with associated nausea and vomiting.  Imaging with moderate intrahepatic and extrahepatic biliary ductal dilation felt to be benign and physiologic postcholecystectomy.  No choledocholithiasis.  Underwent US-guided paracentesis with removal of 3 L.  Fluid study suggests SBP.  Fluid culture NGTD.  Lactic acidosis resolved. ?-IV Zosyn>>> IV ceftriaxone. ?-Stress dose steroid given history of AI. ?-GI recommends avoiding nonselective BB and PPI. ? ?Alcoholic cirrhosis of liver with ascites ([place]) ?S/p paracentesis with removal of 3 L.  Patient reports following with [facility] transplant team for transplant consideration ?-Per GI. ? ?Acute blood  loss anemia ?Recent Labs  ?  04/27/21 ?1716 04/28/21 ?0355 04/29/21 ?0228 04/30/21 ?0145 05/01/21 ?0132 05/06/21 ?8453 05/07/21 ?0030 05/07/21 ?6468 05/08/21 ?0147 05/08/21 ?0321  ?HGB 10.8* 10.5* 10.1* 9.3* 9.7* 11.7* 9.3* 8.0* 7.5* 7.6*  ?Notable bleeding in left forearm and bruising in other extremities.  High risk for bleeding due  to coagulopathy from liver cirrhosis, thrombocytopenia and anticoagulation ?-Hold anticoagulation ?-Monitor H&H. ? ? ?History of DVT (deep vein thrombosis) ?Recently diagnosed with acute DVT involving the left popliteal vein and discharged on warfarin with Lovenox bridge.  Now with significant bleeding from left forearm and bruising in her arms and right leg.  She has thrombocytopenia from liver cirrhosis.  She is at risk for coagulopathy and bleeding due to liver cirrhosis and thrombocytopenia. ?-Hold anticoagulation ?-IR consulted for IVC consideration but recommended repeat DVT Doppler before committing patient to IVC. ?-Follow lower extremity venous Doppler ? ?Adrenal insufficiency ([place]) ?Stress dose steroid as above ? ?Lactic acidosis ?Likely due to sepsis.  Resolved. ? ?Hypotension ?Patient has history of adrenal insufficiency. ?-Stress dose steroid and IV albumin as above ?-Continue home midodrine ?-Continue holding diuretics ? ?Acute kidney injury superimposed on chronic kidney disease ([place]) ?Recent Labs  ?  04/10/21 ?0021 04/11/21 ?0110 04/12/21 ?2248 04/27/21 ?1716 04/28/21 ?0355 04/29/21 ?0228 04/30/21 ?0145 05/06/21 ?2500 05/07/21 ?0030 05/07/21 ?0741  ?BUN '11 12 18 19 19 20 20 12 18 20  '$ ?CREATININE 1.11* 1.14* 1.36* 1.00 1.06* 1.08* 1.05* 1.40* 1.45* 1.34*  ?Baseline Cr about 1.0.  Hepatorenal syndrome? ?-IV albumin per GI recommendation ?-Continue home midodrine ?-Avoid nephrotoxic meds ?-Continue holding home diuretics ?-Recheck renal function in the morning ? ? ?Obesity, Class III, BMI 40-49.9 (morbid obesity) ([place]) ?Body mass index is 43.82 kg/m?. ? ? ?Seizure disorder ([place]) ?Stable.  Continue home medications. ? ?Thrombocytopenia ([place]) ?Recent Labs  ?Lab 05/06/21 ?3704 05/07/21 ?0030 05/07/21 ?8889 05/08/21 ?0147 05/08/21 ?1694  ?PLT 124* 105* 97* 95* 98*  ?Platelets relatively stable.  Likely due to liver cirrhosis. ?-Continue monitoring ? ? ?Hypoalbuminemia ?IV albumin per  GI ? ?Hypokalemia ?K3.4.  P.o. KCl 40x1.  Check magnesium. ? ?Anxiety and depression ?Continue home Xanax ? ? ?DVT prophylaxis:  ?None due to bleeding and DVT ? ?Code Status: Full code ?Family Communication: Patient and/or RN. Available if any question.  ?Level of care: Telemetry Medical ?Status is: Inpatient ?Remains inpatient appropriate because: Severe sepsis due to spontaneous bacterial peritonitis and acute blood loss anemia ? ? ?Final disposition: To be determined. ?Consultants:  ?Gastroenterology ?Interventional radiology ? ?Sch Meds:  ?Scheduled Meds: ? hydrocortisone sod succinate (SOLU-CORTEF) inj  75 mg Intravenous BID  ? levETIRAcetam  500 mg Oral BID  ? midodrine  10 mg Oral TID WC  ? mometasone-formoterol  2 puff Inhalation BID  ? nystatin  4 mL Oral QID  ? pantoprazole  40 mg Oral Daily  ? primidone  100 mg Oral q morning  ? sodium chloride flush  3 mL Intravenous Q12H  ? ?Continuous Infusions: ? cefTRIAXone (ROCEPHIN)  IV 2 g (05/08/21 0905)  ? ?PRN Meds:.ALPRAZolam, traMADol ? ?Antimicrobials: ?Anti-infectives (From admission, onward)  ? ? Start     Dose/Rate Route Frequency Ordered Stop  ? 05/07/21 1000  cefTRIAXone (ROCEPHIN) 2 g in sodium chloride 0.9 % 100 mL IVPB       ? 2 g ?200 mL/hr over 30 Minutes Intravenous Every 24 hours 05/07/21 0851    ? 05/06/21 1415  piperacillin-tazobactam (ZOSYN) IVPB 3.375 g  Status:  Discontinued       ?  3.375 g ?12.5 mL/hr over 240 Minutes Intravenous Every 8 hours 05/06/21 1409 05/07/21 0851  ? ?  ? ? ? ?I have personally reviewed the following labs and images: ?CBC: ?Recent Labs  ?Lab 05/06/21 ?5427 05/07/21 ?0030 05/07/21 ?0623 05/08/21 ?0147 05/08/21 ?7628  ?WBC 4.1 6.9 6.4 6.4 5.5  ?NEUTROABS 3.0  --   --   --   --   ?HGB 11.7* 9.3* 8.0* 7.5* 7.6*  ?HCT 35.5* 27.1* 23.5* 23.3* 23.1*  ?MCV 102.0* 99.6 100.0 103.6* 101.8*  ?PLT 124* 105* 97* 95* 98*  ? ?BMP &GFR ?Recent Labs  ?Lab 05/06/21 ?3151 05/07/21 ?0030 05/07/21 ?0741  ?NA 136 134* 134*  ?K 3.7 3.8  3.4*  ?CL 97* 98 97*  ?CO2 '26 28 29  '$ ?GLUCOSE 103* 140* 112*  ?BUN '12 18 20  '$ ?CREATININE 1.40* 1.45* 1.34*  ?CALCIUM 8.6* 8.2* 7.9*  ? ?Estimated Creatinine Clearance: 46.8 mL/min (A) (by C-G formula based on SCr of 1.34 mg/d

## 2021-05-08 NOTE — Assessment & Plan Note (Signed)
Body mass index is 43.82 kg/m?Marland Kitchen ? ?

## 2021-05-08 NOTE — Plan of Care (Signed)
  Problem: Education: Goal: Knowledge of General Education information will improve Description: Including pain rating scale, medication(s)/side effects and non-pharmacologic comfort measures Outcome: Progressing   Problem: Activity: Goal: Risk for activity intolerance will decrease Outcome: Progressing   Problem: Coping: Goal: Level of anxiety will decrease Outcome: Progressing   

## 2021-05-08 NOTE — Progress Notes (Signed)
Lower extremity venous bilateral study completed. ? ?Preliminary results relayed to Cyndia Skeeters, MD and Merceda Elks, RN via secure chat. ? ?See CV Proc for preliminary results report.  ? ?Darlin Coco, RDMS, RVT ? ?

## 2021-05-08 NOTE — Assessment & Plan Note (Signed)
Stress dose steroid as above ?

## 2021-05-08 NOTE — Progress Notes (Signed)
? ? ? ? Progress Note ? ? Subjective  ?Patient states her abdomen is feeling better. No pain. No fever. Main issue is bleb on her arm with bleeding secondary to heparin which was stopped. Otherwise stable. Of note, I saw she got 5% albumin infusion, was supposed to be 25% ? ? ? Objective  ? ?Vital signs in last 24 hours: ?Temp:  [98.2 ?F (36.8 ?C)-98.9 ?F (37.2 ?C)] 98.9 ?F (37.2 ?C) (05/06 0802) ?Pulse Rate:  [66-75] 75 (05/06 0802) ?Resp:  [16-19] 16 (05/06 0802) ?BP: (92-116)/(42-61) 107/44 (05/06 0802) ?SpO2:  [96 %-99 %] 96 % (05/06 0802) ?Last BM Date : 05/05/21 ?General:    white female in NAD ?Abdomen:  Soft, nontender  ?Extremities:  ecchymosis noted on upper and lower extremities ?Neurologic:  Alert and oriented,  grossly normal neurologically. ?Psych:  Cooperative. Normal mood and affect. ? ?Intake/Output from previous day: ?05/05 0701 - 05/06 0700 ?In: 1611.7 [P.O.:720; I.V.:467.9; IV Piggyback:423.8] ?Out: -  ?Intake/Output this shift: ?No intake/output data recorded. ? ?Lab Results: ?Recent Labs  ?  05/07/21 ?6384 05/08/21 ?0147 05/08/21 ?5364  ?WBC 6.4 6.4 5.5  ?HGB 8.0* 7.5* 7.6*  ?HCT 23.5* 23.3* 23.1*  ?PLT 97* 95* 98*  ? ?BMET ?Recent Labs  ?  05/06/21 ?6803 05/07/21 ?0030 05/07/21 ?0741  ?NA 136 134* 134*  ?K 3.7 3.8 3.4*  ?CL 97* 98 97*  ?CO2 '26 28 29  '$ ?GLUCOSE 103* 140* 112*  ?BUN '12 18 20  '$ ?CREATININE 1.40* 1.45* 1.34*  ?CALCIUM 8.6* 8.2* 7.9*  ? ?LFT ?Recent Labs  ?  05/07/21 ?0741  ?PROT 4.6*  ?ALBUMIN 1.9*  ?AST 31  ?ALT 27  ?ALKPHOS 90  ?BILITOT 2.3*  ?BILIDIR 0.8*  ?IBILI 1.5*  ? ?PT/INR ?Recent Labs  ?  05/07/21 ?0030 05/08/21 ?0147  ?LABPROT 20.3* 21.5*  ?INR 1.8* 1.9*  ? ? ?Studies/Results: ?MR ABDOMEN MRCP W WO CONTAST ? ?Result Date: 05/07/2021 ?CLINICAL DATA:  66 year old female with history of right upper quadrant abdominal pain. Biliary dilatation. Evaluate for potential choledocholithiasis. EXAM: MRI ABDOMEN WITHOUT AND WITH CONTRAST (INCLUDING MRCP) TECHNIQUE: Multiplanar  multisequence MR imaging of the abdomen was performed both before and after the administration of intravenous contrast. Heavily T2-weighted images of the biliary and pancreatic ducts were obtained, and three-dimensional MRCP images were rendered by post processing. CONTRAST:  80m GADAVIST GADOBUTROL 1 MMOL/ML IV SOLN COMPARISON:  No prior abdominal MRI. CT of the abdomen and pelvis 05/06/2021. FINDINGS: Lower chest: Unremarkable. Hepatobiliary: There are no suspicious cystic or solid hepatic lesions. Liver has a shrunken appearance and nodular contour, indicative of advanced cirrhosis. Patient is status post cholecystectomy. There is moderate intra and extrahepatic biliary ductal dilatation. Common bile duct measures up to 14 mm in the porta hepatis. No filling defect within the common bile duct to suggest the presence of choledocholithiasis. The common bile duct is dilated all the way to the level of the ampulla. No definite ampullary mass confidently identified on today's examination. Pancreas: No pancreatic mass. No pancreatic ductal dilatation. Increased T2 signal intensity is noted in the peripancreatic soft tissues, suggesting pancreatic/peripancreatic inflammation. No well-defined peripancreatic fluid collections to suggest the presence of pseudocyst or abscess at this time. Spleen:  Unremarkable. Adrenals/Urinary Tract: Multiple subcentimeter T1 hypointense, T2 hyperintense, nonenhancing renal lesions, compatible with simple cysts. No hydroureteronephrosis in the visualized portions of the abdomen. Bilateral adrenal glands are normal in appearance. Stomach/Bowel: Visualized portions are unremarkable. Vascular/Lymphatic: No aneurysm identified in the visualized abdominal vasculature. No lymphadenopathy noted  in the abdomen. Other:  Small volume of ascites. Musculoskeletal: No aggressive appearing osseous lesions are noted in the visualized portions of the skeleton. Increased T2 signal intensity in the body  wall indicative of body wall edema. IMPRESSION: 1. Patient is status post cholecystectomy with moderate intra and extrahepatic biliary ductal dilatation, favored to reflect benign post cholecystectomy physiology. No choledocholithiasis or other source of obstruction identified. The possibility of a very small ampullary mass is not excluded, but not strongly favored on the basis of today's examination. Further evaluation with endoscopic ultrasound/ERCP could be considered if clinically appropriate. 2. Cirrhosis. 3. Small volume of ascites. Electronically Signed   By: Vinnie Langton M.D.   On: 05/07/2021 05:46  ? ?CT Angio Abd/Pel w/ and/or w/o ? ?Result Date: 05/06/2021 ?CLINICAL DATA:  Abdominal pain Decompensated cirrhosis Nausea Emesis EXAM: CTA ABDOMEN AND PELVIS WITHOUT AND WITH CONTRAST TECHNIQUE: Multidetector CT imaging of the abdomen and pelvis was performed using the standard protocol during bolus administration of intravenous contrast. Multiplanar reconstructed images and MIPs were obtained and reviewed to evaluate the vascular anatomy. RADIATION DOSE REDUCTION: This exam was performed according to the departmental dose-optimization program which includes automated exposure control, adjustment of the mA and/or kV according to patient size and/or use of iterative reconstruction technique. CONTRAST:  188m OMNIPAQUE IOHEXOL 350 MG/ML SOLN COMPARISON:  CT abdomen pelvis 04/27/2021 FINDINGS: VASCULAR Aorta: Normal caliber aorta without aneurysm, dissection, vasculitis or significant stenosis. Celiac: Patent without evidence of aneurysm, dissection, vasculitis or significant stenosis. SMA: Patent without evidence of aneurysm, dissection, vasculitis or significant stenosis. Renals: Both renal arteries are patent without evidence of aneurysm, dissection, vasculitis, fibromuscular dysplasia or significant stenosis. IMA: Patent. Inflow: Patent without evidence of aneurysm, dissection, vasculitis or significant  stenosis. Proximal Outflow: Bilateral common femoral and visualized portions of the superficial and profunda femoral arteries are patent without evidence of aneurysm, dissection, vasculitis or significant stenosis. Veins: Hepatic, portal, superior mesenteric, and splenic veins are normal. Review of the MIP images confirms the above findings. NON-VASCULAR Lower chest: No acute abnormality. Hepatobiliary: Postsurgical changes of cholecystectomy. Cirrhotic liver morphology without focal hepatic lesion. Interval worsening of intra and extrahepatic bile duct dilatation with common bile duct measuring up to 14 mm. Pancreas: Unremarkable. No pancreatic ductal dilatation or surrounding inflammatory changes. Spleen: Normal in size without focal abnormality. Adrenals/Urinary Tract: Multiple subcentimeter renal hypodensities are too small to fully characterize, however statistically most likely relate to simple cysts. No significant abnormality of the adrenal glands, ureters, or bladder. Stomach/Bowel: No bowel dilatation to indicate ileus or obstruction. Appendix is not definitively visualized. Lymphatic: No enlarged abdominal or pelvic lymph nodes. Mildly prominent pericardial lymph node is not significantly changed in size dating back to 11/06/2019 which indicates a benign etiology. Reproductive: Uterus and bilateral adnexa are unremarkable. Other: Diffuse body wall edema.  Mild abdominal ascites. Musculoskeletal: Slight deformity of the inferior endplate of L2 is unchanged from prior examination. Vertebral body heights are otherwise maintained. No acute osseous abnormality. IMPRESSION: VASCULAR 1. No acute vascular abnormality of the abdomen or pelvis. NON-VASCULAR 1. Interval worsening of intra and extrahepatic bile duct dilatation. Consider further evaluation with MRI/MRCP when patient condition allows to exclude distal obstructing stone, stricture, or mass. 2. Cirrhotic liver morphology without focal hepatic lesion. 3.  Mild abdominal ascites. Electronically Signed   By: FMiachel RouxM.D.   On: 05/06/2021 13:23  ? ?IR Paracentesis ? ?Result Date: 05/06/2021 ?INDICATION: Cirrhosis with ascites. Abdominal discomfort. Request for dia

## 2021-05-08 NOTE — Assessment & Plan Note (Addendum)
Patient has history of adrenal insufficiency.  Resolved. ?-Decrease IV Solu-Cortef to 50 mg twice daily.  May transition to home Cortef tomorrow ?-Continue home midodrine ?-Continue holding diuretics until discharge ?

## 2021-05-08 NOTE — Progress Notes (Signed)
Pt on hep drip. Pt has a huge blood filled blister on left forearm with active leaking on both ends. Paged doc.Hilarie Fredrickson with concerns of hep drip and active bleeding and last hgb of 8. Labs placed and hep stopped. Will cont to monitor. Arm dressed.  ?

## 2021-05-08 NOTE — Assessment & Plan Note (Addendum)
S/p paracentesis with removal of 3 L.  Patient reports following with [facility] transplant team for transplant consideration ?-Resume diuretics on discharge ?-Patient has upcoming appointment with GI at [facility] on 5/10 ?-Sodium restriction ?-IV albumin for hypoalbuminemia per GI ?

## 2021-05-08 NOTE — Assessment & Plan Note (Addendum)
K3.4.  P.o. KCl 40x x2 ?

## 2021-05-08 NOTE — Assessment & Plan Note (Addendum)
Recently diagnosed with acute DVT involving the left popliteal vein and discharged on warfarin with Lovenox bridge.  Now with significant bleeding from left forearm and bruising in her arms and right leg.  She has thrombocytopenia from liver cirrhosis.  She is at risk for coagulopathy and bleeding due to liver cirrhosis and thrombocytopenia.  Anticoagulation discontinued.  IR consulted for IVC filter but recommended repeat LE Doppler before committing patient to IVC filter.  LE venous Doppler negative for DVT bilaterally. ?-IVC filter and further anticoagulation deferred, although she remains at risk for another VTE.  This has been discussed with patient.  ?

## 2021-05-08 NOTE — Assessment & Plan Note (Signed)
- 

## 2021-05-08 NOTE — Assessment & Plan Note (Signed)
Recent Labs  ?Lab 05/06/21 ?5615 05/07/21 ?0030 05/07/21 ?3794 05/08/21 ?0147 05/08/21 ?3276  ?PLT 124* 105* 97* 95* 98*  ?Platelets relatively stable.  Likely due to liver cirrhosis. ?-Continue monitoring ? ?

## 2021-05-08 NOTE — Progress Notes (Signed)
Received request for placement of IVC filter.  Pt had a left popliteal DVT confirmed on venous doppler 4/25 with heparin gtt in hospital and then bridged to coumadin.  She presented back to ED with bullae on arm filled with blood.  AC has been stopped.  Recommend confirming residual DVT with venous doppler prior to considering intervention.  Recommendation from Dr. Pascal Lux discussed with Dr. Cyndia Skeeters who agrees.  IR will place the IVC request on hold and await doppler study results. ? ?Electronically Signed: ?Pasty Spillers, PA-C ?05/08/2021, 12:38 PM ? ? ?

## 2021-05-09 DIAGNOSIS — K7031 Alcoholic cirrhosis of liver with ascites: Secondary | ICD-10-CM | POA: Diagnosis not present

## 2021-05-09 DIAGNOSIS — N179 Acute kidney failure, unspecified: Secondary | ICD-10-CM | POA: Diagnosis not present

## 2021-05-09 DIAGNOSIS — R5381 Other malaise: Secondary | ICD-10-CM

## 2021-05-09 DIAGNOSIS — Z7189 Other specified counseling: Secondary | ICD-10-CM

## 2021-05-09 DIAGNOSIS — A419 Sepsis, unspecified organism: Secondary | ICD-10-CM | POA: Diagnosis not present

## 2021-05-09 DIAGNOSIS — D62 Acute posthemorrhagic anemia: Secondary | ICD-10-CM | POA: Diagnosis not present

## 2021-05-09 LAB — PROTIME-INR
INR: 1.5 — ABNORMAL HIGH (ref 0.8–1.2)
Prothrombin Time: 17.8 seconds — ABNORMAL HIGH (ref 11.4–15.2)

## 2021-05-09 LAB — CBC
HCT: 24.3 % — ABNORMAL LOW (ref 36.0–46.0)
Hemoglobin: 7.9 g/dL — ABNORMAL LOW (ref 12.0–15.0)
MCH: 33.3 pg (ref 26.0–34.0)
MCHC: 32.5 g/dL (ref 30.0–36.0)
MCV: 102.5 fL — ABNORMAL HIGH (ref 80.0–100.0)
Platelets: 95 10*3/uL — ABNORMAL LOW (ref 150–400)
RBC: 2.37 MIL/uL — ABNORMAL LOW (ref 3.87–5.11)
RDW: 16.9 % — ABNORMAL HIGH (ref 11.5–15.5)
WBC: 6.8 10*3/uL (ref 4.0–10.5)
nRBC: 0 % (ref 0.0–0.2)

## 2021-05-09 LAB — COMPREHENSIVE METABOLIC PANEL
ALT: 25 U/L (ref 0–44)
AST: 25 U/L (ref 15–41)
Albumin: 2.7 g/dL — ABNORMAL LOW (ref 3.5–5.0)
Alkaline Phosphatase: 86 U/L (ref 38–126)
Anion gap: 7 (ref 5–15)
BUN: 16 mg/dL (ref 8–23)
CO2: 27 mmol/L (ref 22–32)
Calcium: 8.4 mg/dL — ABNORMAL LOW (ref 8.9–10.3)
Chloride: 100 mmol/L (ref 98–111)
Creatinine, Ser: 1.11 mg/dL — ABNORMAL HIGH (ref 0.44–1.00)
GFR, Estimated: 55 mL/min — ABNORMAL LOW (ref >60)
Glucose, Bld: 149 mg/dL — ABNORMAL HIGH (ref 70–99)
Potassium: 3.4 mmol/L — ABNORMAL LOW (ref 3.5–5.1)
Sodium: 134 mmol/L — ABNORMAL LOW (ref 135–145)
Total Bilirubin: 1.4 mg/dL — ABNORMAL HIGH (ref 0.3–1.2)
Total Protein: 5.6 g/dL — ABNORMAL LOW (ref 6.5–8.1)

## 2021-05-09 LAB — HEPARIN LEVEL (UNFRACTIONATED): Heparin Unfractionated: <0.1 IU/mL — ABNORMAL LOW (ref 0.30–0.70)

## 2021-05-09 MED ORDER — POTASSIUM CHLORIDE CRYS ER 20 MEQ PO TBCR
40.0000 meq | EXTENDED_RELEASE_TABLET | ORAL | Status: AC
  Administered 2021-05-09 (×2): 40 meq via ORAL
  Filled 2021-05-09 (×2): qty 2

## 2021-05-09 MED ORDER — HYDROCORTISONE SOD SUC (PF) 100 MG IJ SOLR
50.0000 mg | Freq: Two times a day (BID) | INTRAMUSCULAR | Status: DC
Start: 2021-05-09 — End: 2021-05-10
  Administered 2021-05-09 (×2): 50 mg via INTRAVENOUS
  Filled 2021-05-09 (×3): qty 2

## 2021-05-09 MED ORDER — LOPERAMIDE HCL 2 MG PO CAPS
2.0000 mg | ORAL_CAPSULE | ORAL | Status: DC | PRN
  Administered 2021-05-09 – 2021-05-10 (×3): 2 mg via ORAL
  Filled 2021-05-09 (×3): qty 1

## 2021-05-09 MED ORDER — IPRATROPIUM-ALBUTEROL 0.5-2.5 (3) MG/3ML IN SOLN: 3.0000 mL | Freq: Four times a day (QID) | RESPIRATORY_TRACT | Status: DC | PRN

## 2021-05-09 NOTE — Evaluation (Signed)
Physical Therapy Evaluation ?Patient Details ?Name: Carla Leonard ?MRN: [id number] ?DOB: 1955-11-25 ?Today's Date: 05/09/2021 ? ?History of Present Illness ? Pt is a 66 y/o F presenting to ED on 5/4 wtih abdominal pain associated with n/v, admitted for severe sepsis due to possible SBP. PMH includes arthritis, depression, kidney stones, recent DVT on lovenox,  and HTN.  ?Clinical Impression ? Patient evaluated by Physical Therapy with no further acute PT needs identified. Pt is currently at at modified independent level of mobility. All education has been completed and the patient has no further questions. Pt has no follow-up Physical Therapy or equipment needs. PT is signing off. Pt has been referred to Mobility Team to continue regular ambulation.    ?   ? ?Recommendations for follow up therapy are one component of a multi-disciplinary discharge planning process, led by the attending physician.  Recommendations may be updated based on patient status, additional functional criteria and insurance authorization. ? ?Follow Up Recommendations No PT follow up ? ?  ?Assistance Recommended at Discharge None  ?Patient can return home with the following ? Assist for transportation ? ?  ?Equipment Recommendations None recommended by PT  ?   ?Functional Status Assessment Patient has not had a recent decline in their functional status  ? ?  ?Precautions / Restrictions Restrictions ?Weight Bearing Restrictions: No  ? ?  ? ?Mobility ? Bed Mobility ?Overal bed mobility: Modified Independent ?  ?  ?  ?  ?  ?  ?  ?  ? ?Transfers ?Overall transfer level: Modified independent ?Equipment used:  (pushes IV pole) ?  ?  ?  ?  ?  ?  ?  ?  ?  ? ?Ambulation/Gait ?Ambulation/Gait assistance: Modified independent (Device/Increase time) ?Gait Distance (Feet): 400 Feet ?Assistive device: IV Pole ?Gait Pattern/deviations: Step-through pattern, Decreased stride length ?Gait velocity: WFL ?Gait velocity interpretation: >2.62 ft/sec, indicative of  community ambulatory ?  ?General Gait Details: ambulates in hallway pushing IV pole, able to perform head turns and changes in gait speed with no LoB, as pt fatigues she reaches out for handrail in hallway ? ? ?  ? ?  ? ?Balance Overall balance assessment: No apparent balance deficits (not formally assessed) ?  ?  ?  ?  ?  ?  ?  ?  ?  ?  ?  ?  ?  ?  ?  ?  ?  ?  ?   ? ? ? ?Pertinent Vitals/Pain Pain Assessment ?Pain Assessment: No/denies pain  ? ? ?Home Living Family/patient expects to be discharged to:: Private residence ?Living Arrangements: Alone ?Available Help at Discharge: Neighbor;Friend(s);Available 24 hours/day;Family ?Type of Home: Apartment ?Home Access: Stairs to enter ?Entrance Stairs-Rails: Left ?Entrance Stairs-Number of Steps: 2 ?  ?Home Layout: One level ?Home Equipment: Cane - single point;Rollator (4 wheels);Grab bars - tub/shower;Shower seat ?   ?  ?Prior Function Prior Level of Function : Independent/Modified Independent ?  ?  ?  ?  ?  ?  ?Mobility Comments: uses cane for community mobility ?ADLs Comments: was washing up at sink ?  ? ? ?   ?Extremity/Trunk Assessment  ? Upper Extremity Assessment ?Upper Extremity Assessment: Defer to OT evaluation ?  ? ?Lower Extremity Assessment ?Lower Extremity Assessment: Overall WFL for tasks assessed ?  ? ?Cervical / Trunk Assessment ?Cervical / Trunk Assessment: Normal  ?Communication  ? Communication: No difficulties  ?Cognition Arousal/Alertness: Awake/alert ?Behavior During Therapy: [facility] for tasks assessed/performed ?Overall Cognitive Status: Within Functional Limits  for tasks assessed ?  ?  ?  ?  ?  ?  ?  ?  ?  ?  ?  ?  ?  ?  ?  ?  ?General Comments: Patient very pleasant, tangential ?  ?  ? ?  ?General Comments General comments (skin integrity, edema, etc.): VSS on RA ? ?  ?   ? ?Assessment/Plan  ?  ?PT Assessment Patient does not need any further PT services  ?   ?   ? ?PT Goals (Current goals can be found in the Care Plan section)  ?Acute Rehab PT  Goals ?Patient Stated Goal: stay out of hospital ?PT Goal Formulation: All assessment and education complete, DC therapy ? ?  ? ?AM-PAC PT "6 Clicks" Mobility  ?Outcome Measure Help needed turning from your back to your side while in a flat bed without using bedrails?: None ?Help needed moving from lying on your back to sitting on the side of a flat bed without using bedrails?: None ?Help needed moving to and from a bed to a chair (including a wheelchair)?: None ?Help needed standing up from a chair using your arms (e.g., wheelchair or bedside chair)?: None ?Help needed to walk in hospital room?: None ?Help needed climbing 3-5 steps with a railing? : None ?6 Click Score: 24 ? ?  ?End of Session Equipment Utilized During Treatment: Gait belt ?Activity Tolerance: Patient tolerated treatment well ?Patient left: in bed;with call bell/phone within reach ?Nurse Communication: Mobility status ?PT Visit Diagnosis: Other abnormalities of gait and mobility (R26.89);History of falling (Z91.81) ?  ? ?Time: 1101-1115 ?PT Time Calculation (min) (ACUTE ONLY): 14 min ? ? ?Charges:   PT Evaluation ?$PT Eval Low Complexity: 1 Low ?  ?  ?   ? ? ?Carla Leonard B. Migdalia Dk PT, DPT ?Acute Rehabilitation Services ?Please use secure chat or  ?Call Office [phone] ? ? ?[place] ?05/09/2021, 1:47 PM ? ?

## 2021-05-09 NOTE — Evaluation (Signed)
Occupational Therapy Evaluation ?Patient Details ?Name: Carla Leonard ?MRN: [id number] ?DOB: 03/07/1955 ?Today's Date: 05/09/2021 ? ? ?History of Present Illness Pt is a 66 y/o F presenting to ED on 5/4 wtih abdominal pain associated with n/v, admitted for severe sepsis due to possible SBP. PMH includes arthritis, depression, kidney stones, recent DVT on lovenox,  and HTN.  ? ?Clinical Impression ?  ?Pt independent at baseline with ADLs and functional mobility, lives alone but has friends/neighbors that can assist at d/c. Pt currently mod I for bed mobility and transfers, and is at baseline for ADLs. Pt able to complete toilet transfer, and UB/LB dressing during session with mod I. Pt presenting with impairments listed below, however has no acute OT needs at this time. Will s/o. Please reconsult if there is a change in pt status. ?   ? ?Recommendations for follow up therapy are one component of a multi-disciplinary discharge planning process, led by the attending physician.  Recommendations may be updated based on patient status, additional functional criteria and insurance authorization.  ? ?Follow Up Recommendations ? No OT follow up  ?  ?Assistance Recommended at Discharge PRN  ?Patient can return home with the following Assistance with cooking/housework ? ?  ?Functional Status Assessment ? Patient has had a recent decline in their functional status and demonstrates the ability to make significant improvements in function in a reasonable and predictable amount of time.  ?Equipment Recommendations ? None recommended by OT;Other (comment) (pt has all needed DME)  ?  ?Recommendations for Other Services   ? ? ?  ?Precautions / Restrictions Restrictions ?Weight Bearing Restrictions: No  ? ?  ? ?Mobility Bed Mobility ?Overal bed mobility: Modified Independent ?  ?  ?  ?  ?  ?  ?  ?  ? ?Transfers ?Overall transfer level: Modified independent ?Equipment used:  (pushes IV pole) ?  ?  ?  ?  ?  ?  ?  ?  ?  ? ?  ?Balance  Overall balance assessment: No apparent balance deficits (not formally assessed) ?  ?  ?  ?  ?  ?  ?  ?  ?  ?  ?  ?  ?  ?  ?  ?  ?  ?  ?   ? ?ADL either performed or assessed with clinical judgement  ? ?ADL Overall ADL's : At baseline ?  ?  ?  ?  ?  ?  ?  ?  ?  ?  ?  ?  ?  ?  ?  ?  ?  ?  ?  ?General ADL Comments: pt ambulating in room and hall without AD, demonstrates ability to complete toileting, UB/LB dressing during session independently.  ? ? ? ?Vision Baseline Vision/History: 1 Wears glasses ?Vision Assessment?: No apparent visual deficits  ?   ?Perception   ?  ?Praxis   ?  ? ?Pertinent Vitals/Pain Pain Assessment ?Pain Assessment: No/denies pain  ? ? ? ?Hand Dominance   ?  ?Extremity/Trunk Assessment Upper Extremity Assessment ?Upper Extremity Assessment: Overall WFL for tasks assessed ?  ?Lower Extremity Assessment ?Lower Extremity Assessment: Defer to PT evaluation ?  ?Cervical / Trunk Assessment ?Cervical / Trunk Assessment: Normal ?  ?Communication Communication ?Communication: No difficulties ?  ?Cognition Arousal/Alertness: Awake/alert ?Behavior During Therapy: [facility] for tasks assessed/performed ?Overall Cognitive Status: Within Functional Limits for tasks assessed ?  ?  ?  ?  ?  ?  ?  ?  ?  ?  ?  ?  ?  ?  ?  ?  ?  General Comments: Patient very pleasant, tangential ?  ?  ?General Comments  VSS On RA ? ?  ?Exercises   ?  ?Shoulder Instructions    ? ? ?Home Living Family/patient expects to be discharged to:: Private residence ?Living Arrangements: Alone ?Available Help at Discharge: Neighbor;Friend(s);Available 24 hours/day;Family ?Type of Home: Apartment ?Home Access: Stairs to enter ?Entrance Stairs-Number of Steps: 2 ?Entrance Stairs-Rails: Left ?Home Layout: One level ?  ?  ?Bathroom Shower/Tub: Tub/shower unit;Sponge bathes at baseline ?  ?Bathroom Toilet: Standard ?Bathroom Accessibility: Yes ?  ?Home Equipment: Cane - single point;Rollator (4 wheels);Grab bars - tub/shower;Shower seat ?  ?  ?  ? ?   ?Prior Functioning/Environment Prior Level of Function : Independent/Modified Independent ?  ?  ?  ?  ?  ?  ?Mobility Comments: uses cane for community mobility ?ADLs Comments: was washing up at sink ?  ? ?  ?  ?OT Problem List: Decreased activity tolerance ?  ?   ?OT Treatment/Interventions:    ?  ?OT Goals(Current goals can be found in the care plan section) Acute Rehab OT Goals ?Patient Stated Goal: to go home ?OT Goal Formulation: All assessment and education complete, DC therapy  ?OT Frequency:   ?  ? ?Co-evaluation   ?  ?  ?  ?  ? ?  ?AM-PAC OT "6 Clicks" Daily Activity     ?Outcome Measure Help from another person eating meals?: None ?Help from another person taking care of personal grooming?: None ?Help from another person toileting, which includes using toliet, bedpan, or urinal?: None ?Help from another person bathing (including washing, rinsing, drying)?: None ?Help from another person to put on and taking off regular upper body clothing?: None ?Help from another person to put on and taking off regular lower body clothing?: None ?6 Click Score: 24 ?  ?End of Session Nurse Communication: Mobility status ? ?Activity Tolerance: Patient tolerated treatment well ?Patient left: Other (comment) (up in room, handoff to PT) ? ?OT Visit Diagnosis: Other abnormalities of gait and mobility (R26.89)  ?              ?Time: 1050-1106 ?OT Time Calculation (min): 16 min ?Charges:  OT General Charges ?$OT Visit: 1 Visit ?OT Evaluation ?$OT Eval Low Complexity: 1 Low ? ?Lynnda Child, OTD, OTR/L ?Acute Rehab ?[phone] ? ?Kaylyn Lim ?05/09/2021, 12:46 PM ?

## 2021-05-09 NOTE — Progress Notes (Signed)
? ? ? ? Progress Note ? ? Subjective  ?Patient reports her abdomen is doing much better. Has no pain what soever. Afebrile. Renal function improving. Had [place] of her leg yesterday ? ? Objective  ? ?Vital signs in last 24 hours: ?Temp:  [98 ?F (36.7 ?C)-98.6 ?F (37 ?C)] 98 ?F (36.7 ?C) (05/07 1540) ?Pulse Rate:  [78-83] 83 (05/07 0748) ?Resp:  [16-19] 19 (05/07 0748) ?BP: (97-125)/(47-61) 125/61 (05/07 0748) ?SpO2:  [98 %-100 %] 100 % (05/07 0748) ?Last BM Date : 05/09/21 ?General:    white female in NAD ?Abdomen:  Soft, nontender  ?Extremities:  extensive ecchymosis on upper and LE bilaterally ?Neurologic:  Alert and oriented,  grossly normal neurologically. ?Psych:  Cooperative. Normal mood and affect. ? ?Intake/Output from previous day: ?05/06 0701 - 05/07 0700 ?In: 9.8 [IV Piggyback:9.8] ?Out: -  ?Intake/Output this shift: ?No intake/output data recorded. ? ?Lab Results: ?Recent Labs  ?  05/08/21 ?0147 05/08/21 ?0867 05/09/21 ?0125  ?WBC 6.4 5.5 6.8  ?HGB 7.5* 7.6* 7.9*  ?HCT 23.3* 23.1* 24.3*  ?PLT 95* 98* 95*  ? ?BMET ?Recent Labs  ?  05/07/21 ?0030 05/07/21 ?6195 05/09/21 ?0125  ?NA 134* 134* 134*  ?K 3.8 3.4* 3.4*  ?CL 98 97* 100  ?CO2 '28 29 27  '$ ?GLUCOSE 140* 112* 149*  ?BUN '18 20 16  '$ ?CREATININE 1.45* 1.34* 1.11*  ?CALCIUM 8.2* 7.9* 8.4*  ? ?LFT ?Recent Labs  ?  05/07/21 ?0741 05/09/21 ?0125  ?PROT 4.6* 5.6*  ?ALBUMIN 1.9* 2.7*  ?AST 31 25  ?ALT 27 25  ?ALKPHOS 90 86  ?BILITOT 2.3* 1.4*  ?BILIDIR 0.8*  --   ?IBILI 1.5*  --   ? ?PT/INR ?Recent Labs  ?  05/08/21 ?0147 05/09/21 ?0125  ?LABPROT 21.5* 17.8*  ?INR 1.9* 1.5*  ? ? ?Studies/Results: ? ? ? ? Assessment / Plan:   ?66 y/o female with alcoholic cirrhosis, ascites, history of abdominal wall cellulitis, recent DVT on Lovenox, history of cholecystectomy who presented with abdominal pain and fever, found to have SBP.  ? ?Recall MRCP done which showed NO evidence of choledocholithiasis. She has some biliary ductal dilation chronically, hopefully from post  cholecystectomy state but EUS may be reasonable as outpatient once she has recovered from her acute issues to make sure ampulla is okay.   ? ?She has done well on ceftriaxone and IV albumin infusions. Renal function improved, her infection appears to be resolving. Due for her last day of albumin infusion today. She will need 5 total days of IV antibiotic therapy for treatment. As long as she is doing well and recovering as expected do not feel strongly she needs another paracentesis to confirm improvement. I would reserve that for persistent pain or symptoms of infection after day 5 of antibiotics. Moving forward would avoid PPI and nonselective beta blocker. She will need SBP prophylaxis long term once she completes her inpatient course - normally would recommend DS bactrim daily but looks like she has an allergy to sulfa, so may need to use cipro instead. ? ?Otherwise, she did not tolerating heparin drip to due bleeding bulla on her arm, consider IVC filter, her US shows no persistent DVT.  ?  ? Recommend: ?- continue BMET daily ?- continue ceftriaxone for total of 5 days of IV antibiotics for treatment of her SBP ?- follow up cultures ?- 25% albumin infusion today 1.0 gm / kg (up to '100mg'$  max dose) ?- will need SBP prophylaxis upon discharge - given her  sulfa allergy, would use cipro ?- consideration for EUS as outpatient ?- decision on IVC filter per primary service and IR ?- patient will have follow up with [facility] hepatology and Dr. Lorenso Courier in our clinic. She is scheduled to see Dr. Lorenso Courier on 5/10 and should keep that appointment if she is out of the hospital. If she is still inpatient at that time we will cancel and reschedule her. ? ?Will sign off for now, please call with questions or changes in her status. ? ?Jolly Mango, MD ?[facility] ? ?

## 2021-05-09 NOTE — Assessment & Plan Note (Signed)
Improved. Monitor.  

## 2021-05-09 NOTE — Assessment & Plan Note (Signed)
PT/OT eval ?

## 2021-05-09 NOTE — Progress Notes (Signed)
?PROGRESS NOTE ? ?Carla Leonard CZY:[id number] DOB: 1955/09/11  ? ?PCP: Darrol Jump, NP ? ?Patient is from: Home. ? ?DOA: 05/06/2021 LOS: 3 ? ?Chief complaints ?Chief Complaint  ?Patient presents with  ? Abdominal Pain  ?  ? ?Brief Narrative / Interim history: ?66 year old F with PMH of alcoholic cirrhosis with ascites, adrenal insufficiency, cholecystectomy in her 49s, CKD-3A, seizure disorder, anxiety, depression, morbid obesity, thrombocytopenia and recent diagnosis of LLE DVT on warfarin returning with acute severe epigastric abdominal pain with associated nausea and vomiting and admitted for severe sepsis due to possible SBP.  In ED, she was febrile to 100.4 with mild tachycardia and hypotension.  Hgb 11.7.  Platelet 124. Cr 1.4.  ALP 143.  AST 52.  Total bili 2.3.  INR 1.4.  Lactic acid 5.4>> 4.0.  Blood cultures obtained.  CT with interval worsening of intra and extrahepatic bile duct dilation.  MRCP recommended.  Started on IV fluid and IV Zosyn.  GI consulted. ? 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  Patient underwent US-guided paracentesis with removal of 3 L.  Fluid study concerning for SBP.  Antibiotics de-escalated to IV ceftriaxone.  GI recommended IV CTX for 5 days and discharged on p.o. Cipro for SBP prophylaxis.  Patient has upcoming appointment at the [facility] on 5/10. ? ?Hospital course complicated by significant left arm bloody bulla/hematoma and bruising.  Anticoagulation discontinued.  IR consulted for IVC filter, and recommended repeat LE venous Doppler before committing patient to IVC filter.  LE venous Doppler negative.  IVC filter deferred. ? ?  ? ?Subjective: ?Seen and examined earlier this morning.  No major events overnight of this morning.  Feels better other than insomnia last night, partly due to anxiety.  She denies chest pain, dyspnea, nausea, vomiting or abdominal pain.   She feels she might have some fluid in her belly again. ? ?Objective: ?Vitals:  ? 05/08/21 1949 05/08/21 1957 05/09/21 0454 05/09/21 0748  ?BP: (!) 123/55  (!) 122/56 125/61  ?Pulse: 80  78 83  ?Resp: '17  17 19  '$ ?Temp: 98.1 ?F (36.7 ?C)  98 ?F (36.7 ?C) 98 ?F (36.7 ?C)  ?TempSrc: Oral  Oral Oral  ?SpO2: 98% 99% 98% 100%  ?Weight:      ?Height:      ? ? ?Examination: ? ?GENERAL: No apparent distress.  Nontoxic. ?HEENT: MMM.  Vision and hearing grossly intact.  ?NECK: Supple.  No apparent JVD.  ?RESP:  No IWOB.  Fair aeration bilaterally. ?CVS:  RRR. Heart sounds normal.  ?ABD/GI/GU: BS+. Abd soft, NTND.  Some dullness to percussion in left abdomen. ?MSK/EXT:  Moves extremities. No apparent deformity. No edema.  ?SKIN: Large bloody bulla over lateral aspect of left forearm.  Bruising in LUE and RLE. ?NEURO: Awake and alert. Oriented appropriately.  No apparent focal neuro deficit. ?PSYCH: Calm. Normal affect.  ? ?Procedures:  ?5/4-IR paracentesis with removal of 3 L ? ?Microbiology summarized: ?Blood cultures NGTD. ?Peritoneal fluid culture NGTD. ? ?Assessment and Plan: ?* Severe sepsis due to SBP ?Patient presents with acute epigastric abdominal pain with associated nausea and vomiting.  Imaging with moderate intrahepatic and extrahepatic biliary ductal dilation felt to be benign and physiologic postcholecystectomy.  No choledocholithiasis.  Underwent US-guided paracentesis with removal of 3 L.  Fluid study suggests SBP.  Fluid culture NGTD.  Lactic acidosis resolved. ?-IV Zosyn 5/4-5/5>>> IV ceftriaxone 5/5>>. ?-Wean off stress dose steroid-decreased to 50 mg twice  Leonard ?-GI recs:  ? -IV CTX for 5 days for SBP treatment, then p.o. Cipro for SBP prophylaxis ? -25% albumin infusion today 1.0 gm / kg (up to '100mg'$  max dose) ? -Consider EUS outpatient ? -Keep appointment with hepatologist, Dr. Lorenso Courier at the [facility] on 5/10 ?-Choledocholithiasis ruled out. ? ?Alcoholic cirrhosis of liver with ascites ([place]) ?S/p paracentesis  with removal of 3 L.  Patient reports following with [facility] transplant team for transplant consideration ?-Resume diuretics on discharge ?-Patient has upcoming appointment with GI at [facility] on 5/10 ?-Sodium restriction ?-IV albumin for hypoalbuminemia per GI ? ?Acute blood loss anemia ?Recent Labs  ?  04/28/21 ?0355 04/29/21 ?0228 04/30/21 ?0145 05/01/21 ?0132 05/06/21 ?7253 05/07/21 ?0030 05/07/21 ?6644 05/08/21 ?0147 05/08/21 ?0347 05/09/21 ?0125  ?HGB 10.5* 10.1* 9.3* 9.7* 11.7* 9.3* 8.0* 7.5* 7.6* 7.9*  ?Notable bleeding in left forearm and bruising in other extremities.  High risk for bleeding due to coagulopathy from liver cirrhosis, thrombocytopenia and anticoagulation ?-Anticoagulation discontinued. ?-Monitor H&H. ? ? ?History of DVT (deep vein thrombosis) ?Recently diagnosed with acute DVT involving the left popliteal vein and discharged on warfarin with Lovenox bridge.  Now with significant bleeding from left forearm and bruising in her arms and right leg.  She has thrombocytopenia from liver cirrhosis.  She is at risk for coagulopathy and bleeding due to liver cirrhosis and thrombocytopenia.  Anticoagulation discontinued.  IR consulted for IVC filter but recommended repeat LE Doppler before committing patient to IVC filter.  LE venous Doppler negative for DVT bilaterally. ?-IVC filter and further anticoagulation deferred, although she remains at risk for another VTE.  This has been discussed with patient.  ? ?Adrenal insufficiency ([place]) ?Stress dose steroid as above ? ?Lactic acidosis ?Likely due to sepsis.  Resolved. ? ?Hypotension ?Patient has history of adrenal insufficiency.  Resolved. ?-Decrease IV Solu-Cortef to 50 mg twice Leonard.  May transition to home Cortef tomorrow ?-Continue home midodrine ?-Continue holding diuretics until discharge ? ?Acute kidney injury superimposed on chronic kidney disease ([place]) ?Recent Labs  ?  04/11/21 ?0110 04/12/21 ?4259 04/27/21 ?1716 04/28/21 ?0355 04/29/21 ?0228  04/30/21 ?0145 05/06/21 ?5638 05/07/21 ?0030 05/07/21 ?7564 05/09/21 ?0125  ?BUN '12 18 19 19 20 20 12 18 20 16  '$ ?CREATININE 1.14* 1.36* 1.00 1.06* 1.08* 1.05* 1.40* 1.45* 1.34* 1.11*  ?Baseline Cr about 1.0.  Hepatorenal syndrome? ?-IV albumin as above. ?-Continue home midodrine ?-Avoid nephrotoxic meds ?-Resume home diuretics on discharge if renal function continues to improve ?-Recheck renal function in the morning ? ? ?Goals of care, counseling/discussion ?Extensive discussion about CODE STATUS and pros and cons of CPR.  I suggested DNR/DNI but she prefers to remain full code but does not want to be on life support indefinitely if initial attempt is not successful.  She designated his daughter as Air traffic controller. ? ?Physical deconditioning ?PT/OT eval ? ?Obesity, Class III, BMI 40-49.9 (morbid obesity) ([place]) ?Body mass index is 43.82 kg/m?. ? ? ?Seizure disorder ([place]) ?Stable.  Continue home medications. ? ?Thrombocytopenia ([place]) ?Recent Labs  ?Lab 05/06/21 ?3329 05/07/21 ?0030 05/07/21 ?5188 05/08/21 ?0147 05/08/21 ?4166  ?PLT 124* 105* 97* 95* 98*  ?Platelets relatively stable.  Likely due to liver cirrhosis. ?-Continue monitoring ? ? ?Hypoalbuminemia ?IV albumin per GI ? ?Hyperbilirubinemia ?Improved.  Monitor. ? ?Hypokalemia ?K3.4.  P.o. KCl 40x x2 ? ?Anxiety and depression ?Continue home Xanax ? ? ?DVT prophylaxis:  ?SCD for VTE prophylaxis ? ?Code Status: Full code ?Family Communication: Patient and/or RN. Available if any question.  ?  Level of care: Telemetry Medical ?Status is: Inpatient ?Remains inpatient appropriate because: Severe sepsis due to spontaneous bacterial peritonitis and acute blood loss anemia ? ? ?Final disposition: Home on 5/9 after IV antibiotics ?Consultants:  ?Gastroenterology-signed off ?Interventional radiology-signed off ? ?Sch Meds:  ?Scheduled Meds: ? hydrocortisone sod succinate (SOLU-CORTEF) inj  50 mg Intravenous BID  ? levETIRAcetam  500 mg Oral BID  ? midodrine  10 mg  Oral TID WC  ? mometasone-formoterol  2 puff Inhalation BID  ? nystatin  4 mL Oral QID  ? potassium chloride  40 mEq Oral Q4H  ? primidone  100 mg Oral q morning  ? sodium chloride flush  3 mL Intravenous Q

## 2021-05-09 NOTE — Assessment & Plan Note (Signed)
Extensive discussion about CODE STATUS and pros and cons of CPR.  I suggested DNR/DNI but she prefers to remain full code but does not want to be on life support indefinitely if initial attempt is not successful.  She designated his daughter as Air traffic controller. ?

## 2021-05-10 DIAGNOSIS — R6520 Severe sepsis without septic shock: Secondary | ICD-10-CM | POA: Diagnosis not present

## 2021-05-10 DIAGNOSIS — A419 Sepsis, unspecified organism: Secondary | ICD-10-CM | POA: Diagnosis not present

## 2021-05-10 LAB — COMPREHENSIVE METABOLIC PANEL
ALT: 22 U/L (ref 0–44)
AST: 24 U/L (ref 15–41)
Albumin: 3.3 g/dL — ABNORMAL LOW (ref 3.5–5.0)
Alkaline Phosphatase: 73 U/L (ref 38–126)
Anion gap: 8 (ref 5–15)
BUN: 13 mg/dL (ref 8–23)
CO2: 26 mmol/L (ref 22–32)
Calcium: 8.7 mg/dL — ABNORMAL LOW (ref 8.9–10.3)
Chloride: 101 mmol/L (ref 98–111)
Creatinine, Ser: 1.04 mg/dL — ABNORMAL HIGH (ref 0.44–1.00)
GFR, Estimated: 60 mL/min — ABNORMAL LOW (ref >60)
Glucose, Bld: 142 mg/dL — ABNORMAL HIGH (ref 70–99)
Potassium: 4.5 mmol/L (ref 3.5–5.1)
Sodium: 135 mmol/L (ref 135–145)
Total Bilirubin: 1.5 mg/dL — ABNORMAL HIGH (ref 0.3–1.2)
Total Protein: 5.8 g/dL — ABNORMAL LOW (ref 6.5–8.1)

## 2021-05-10 LAB — PROTIME-INR
INR: 1.4 — ABNORMAL HIGH (ref 0.8–1.2)
Prothrombin Time: 17.4 seconds — ABNORMAL HIGH (ref 11.4–15.2)

## 2021-05-10 LAB — BODY FLUID CULTURE W GRAM STAIN: Culture: NO GROWTH

## 2021-05-10 LAB — CBC
HCT: 22.7 % — ABNORMAL LOW (ref 36.0–46.0)
Hemoglobin: 7.7 g/dL — ABNORMAL LOW (ref 12.0–15.0)
MCH: 34.4 pg — ABNORMAL HIGH (ref 26.0–34.0)
MCHC: 33.9 g/dL (ref 30.0–36.0)
MCV: 101.3 fL — ABNORMAL HIGH (ref 80.0–100.0)
Platelets: 100 10*3/uL — ABNORMAL LOW (ref 150–400)
RBC: 2.24 MIL/uL — ABNORMAL LOW (ref 3.87–5.11)
RDW: 16.9 % — ABNORMAL HIGH (ref 11.5–15.5)
WBC: 5.5 10*3/uL (ref 4.0–10.5)
nRBC: 0 % (ref 0.0–0.2)

## 2021-05-10 LAB — MAGNESIUM: Magnesium: 2 mg/dL (ref 1.7–2.4)

## 2021-05-10 MED ORDER — ZOLPIDEM TARTRATE 5 MG PO TABS
5.0000 mg | ORAL_TABLET | Freq: Every day | ORAL | Status: DC
  Administered 2021-05-10: 5 mg via ORAL
  Filled 2021-05-10: qty 1

## 2021-05-10 MED ORDER — THIAMINE HCL 100 MG PO TABS
100.0000 mg | ORAL_TABLET | Freq: Every day | ORAL | Status: DC
  Administered 2021-05-10 – 2021-05-11 (×2): 100 mg via ORAL
  Filled 2021-05-10 (×2): qty 1

## 2021-05-10 MED ORDER — HYDROCORTISONE 20 MG PO TABS
20.0000 mg | ORAL_TABLET | Freq: Every day | ORAL | Status: DC
Start: 2021-05-10 — End: 2021-05-12
  Administered 2021-05-10 – 2021-05-11 (×2): 20 mg via ORAL
  Filled 2021-05-10 (×2): qty 1

## 2021-05-10 MED ORDER — DIPHENOXYLATE-ATROPINE 2.5-0.025 MG/5ML PO LIQD
5.0000 mL | Freq: Four times a day (QID) | ORAL | Status: DC | PRN
  Administered 2021-05-11: 5 mL via ORAL
  Filled 2021-05-10: qty 5

## 2021-05-10 MED ORDER — FOLIC ACID 1 MG PO TABS
1.0000 mg | ORAL_TABLET | Freq: Every day | ORAL | Status: DC
  Administered 2021-05-10 – 2021-05-11 (×2): 1 mg via ORAL
  Filled 2021-05-10 (×2): qty 1

## 2021-05-10 NOTE — Progress Notes (Signed)
Mobility Specialist Progress Note  ? ? 05/10/21 1432  ?Mobility  ?Activity Refused mobility  ? ?Pt stated she is wore out from going to the [place] so much today. Will f/u as schedule permits.  ? ?Carla Leonard ?Mobility Specialist  ?Primary: 5[address] M.S. Phone: [phone] ?Secondary: 6[address] M.S. Phone: [phone] ?  ?

## 2021-05-10 NOTE — Consult Note (Signed)
? ?  [facility] CM Inpatient Consult ? ? ?05/10/2021 ? ?[name] ?[birth year] ?[id number] ? ?[place] [facility] [[facility]] Patient: ? ?Primary Care Provider:  Darrol Jump, NP, [facility] [[facility]] ? ? ?Patient screened for less than 30 days readmission hospitalization with noted high risk score for unplanned readmission risk and to assess for barriers to care in post hospital transition.  Patient was resting on rounds. ? ?Plan:  Continue to follow progress and disposition to assess for post hospital care management needs.   ? ?For questions contact:  ? ?Natividad Brood, RN BSN CCM ?[facility] Liaison ? [phone] business mobile phone ?Toll free office [phone]  ?Fax number: [phone] ?[email or web address]  ? ? ? ?

## 2021-05-10 NOTE — Care Management Important Message (Signed)
Important Message ? ?Patient Details  ?Name: Carla Leonard ?MRN: [id number] ?Date of Birth: [birth year] ? ? ?Medicare Important Message Given:  Yes ? ? ? ? ?Kentrell Hallahan ?05/10/2021, 3:50 PM ?

## 2021-05-10 NOTE — Progress Notes (Signed)
?PROGRESS NOTE ? ?[place] ZIPP  LFY:[id number] DOB: 1955/03/25 DOA: 05/06/2021 ?PCP: Darrol Jump, NP  ? ?Brief Narrative: ?  ?Patient is a 66 year old female with history of alcoholic cirrhosis with ascites, hydronephrosis, cholecystectomy, anxiety/depression/thrombocytopenia, recent diagnosis of left lower extremity DVT on warfarin who presented with severe abdominal pain, nausea, vomiting.  Patient was febrile, tachycardic, hypotensive with elevated lactate on presentation.  Patient was suspected to have severe due to spontaneous bacterial peritonitis and  was admitted for further management.  Started on antibiotics, GI was following.  Paracentesis confirmed SBP, planning for 5 days of IV treatment.  Possible discharge to home tomorrow. ? ? ?Assessment & Plan: ? ?Principal Problem: ?  Severe sepsis due to SBP ?Active Problems: ?  Alcoholic cirrhosis of liver with ascites ([place]) ?  Acute kidney injury superimposed on chronic kidney disease ([place]) ?  Hypotension ?  Lactic acidosis ?  Adrenal insufficiency ([place]) ?  History of DVT (deep vein thrombosis) ?  Acute blood loss anemia ?  Goals of care, counseling/discussion ?  Anxiety and depression ?  Hypokalemia ?  Hyperbilirubinemia ?  Hypoalbuminemia ?  Thrombocytopenia ([place]) ?  Seizure disorder ([place]) ?  Spontaneous bacterial peritonitis ([place]) ?  Obesity, Class III, BMI 40-49.9 (morbid obesity) ([place]) ?  Physical deconditioning ? ?Severe sepsis secondary to SBP: Presented with abdominal pain, nausea, vomiting, hypotension, tachycardia, elevated lactate.  Found to have SBP, confirmed with paracentesis.  Currently on IV antibiotic with ceftriaxone 4/5, planning for 5-day treatment, on discharge will continue ciprofloxacin. ? ?Alcoholic cirrhosis of liver with ascites: Paracentesis done, removal of 3 L of ascitic fluid.  Patient following with [facility] transplant team for consideration of transplant.  We will resume home diuretics on discharge.  Continue low-sodium diet.   She might need for regular paracentesis in the future. ? ?Biliary duct dilation: MRCP showed moderate intrahepatic, extracardiac biliary duct dilatation.  Suspected to be benign and physiologic postcholecystectomy.  No evidence of choledocholithiasis.  GI recommended outpatient follow-up, outpatient endoscopic ultrasound.  He will follow up with hepatologist, Dr. Lorenso Courier at [facility] on 5/10.  Hyperbilirubinemia improved ? ?Acute blood loss anemia/macrocytic anemia/left arm hemorrhagic bulla: Found to be bruising/hematoma on the extremities.She has high risk of bleeding due to coagulopathy from liver cirrhosis, thrombocytopenia.  On anticoagulation at home which will be discontinued permanently.  Hemoglobin in the range of 7.  Continue to monitor, continue thiamine and folic acid ? ?History of DVT: Recently diagnosed with acute DVT involving the left popliteal vein: She was discharged on warfarin.  Now with significant bleeding from left forearm, bruising.  Also has thrombocytopenia.  High risk of bleeding so anticoagulation discontinued.  IR consulted for IVC filter but lower extremity Doppler did not show any DVT so there is no need of IVC filter as per IR. ? ?Adrenal insufficiency: Chronic.  Continue stress dose steroids.  On Cortef.  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 ?AKI on  CKD stage IIa: Currently kidney function at baseline.  Was given IV albumin during this hospitalization. ? ?Physical deconditioning: PT/OT evaluated him and recommended no outpatient follow-up ? ?Seizure disorder: Continue home medication.  Stable ? ?History of anxiety/depression: Continue home Xanax ? ?Morbid obesity: BMI of 43.8 ? ? ?  ? ?DVT prophylaxis:Place and maintain sequential compression device Start: 05/09/21 1356 ? ? ?  Code Status: Full Code ? ?Family Communication: None at bedside ? ?Patient status:Inpatient ? ?Patient is from :Home ? ?Anticipated discharge EN:IDPO ? ?Estimated  DC  date:tomorrow ? ? ?Consultants: GI,IR ? ?Procedures:Paracentesis ? ?Antimicrobials:  ?Anti-infectives (From admission, onward)  ? ? Start     Dose/Rate Route Frequency Ordered Stop  ? 05/07/21 1000  cefTRIAXone (ROCEPHIN) 2 g in sodium chloride 0.9 % 100 mL IVPB       ? 2 g ?200 mL/hr over 30 Minutes Intravenous Every 24 hours 05/07/21 0851    ? 05/06/21 1415  piperacillin-tazobactam (ZOSYN) IVPB 3.375 g  Status:  Discontinued       ? 3.375 g ?12.5 mL/hr over 240 Minutes Intravenous Every 8 hours 05/06/21 1409 05/07/21 0851  ? ?  ? ? ?Subjective: ?Patient seen and examined at bedside this morning.  When I arrived, she was crying.  She was very emotional about her current condition.  She is also bothered by diarrhea from this morning.  Denies any abdominal pain, nausea or vomiting today.  Very concerned about the hematoma that she developed on her left forearm ? ?Objective: ?Vitals:  ? 05/09/21 2045 05/09/21 2100 05/10/21 0451 05/10/21 0843  ?BP:  (!) 125/59 131/62 138/61  ?Pulse:  70 75 77  ?Resp:  '18 18 18  '$ ?Temp:  97.7 ?F (36.5 ?C) 98.2 ?F (36.8 ?C) 98.1 ?F (36.7 ?C)  ?TempSrc:  Oral Oral Oral  ?SpO2: 98% 100% 100% 100%  ?Weight:      ?Height:      ? ? ?Intake/Output Summary (Last 24 hours) at 05/10/2021 1026 ?Last data filed at 05/09/2021 1134 ?Gross per 24 hour  ?Intake 360 ml  ?Output --  ?Net 360 ml  ? ?Filed Weights  ? 05/06/21 0542 05/07/21 0533  ?Weight: 113.9 kg 105.2 kg  ? ? ?Examination: ? ?General exam: Emotional, crying, morbidly obese ?HEENT: PERRL ?Respiratory system:  no wheezes or crackles  ?Cardiovascular system: S1 & S2 heard, RRR.  ?Gastrointestinal system: Abdomen is distended, soft and nontender. ?Central nervous system: Alert and oriented ?Extremities: Trace edema on bilateral lower extremities, scattered bruises ,ecchymosis ?Skin: no ulcers,no icterus   ? ? ?Data Reviewed: I have personally reviewed following labs and imaging studies ? ?CBC: ?Recent Labs  ?Lab 05/06/21 ?6222 05/07/21 ?0030  05/07/21 ?9798 05/08/21 ?0147 05/08/21 ?9211 05/09/21 ?0125 05/10/21 ?0154  ?WBC 4.1   < > 6.4 6.4 5.5 6.8 5.5  ?NEUTROABS 3.0  --   --   --   --   --   --   ?HGB 11.7*   < > 8.0* 7.5* 7.6* 7.9* 7.7*  ?HCT 35.5*   < > 23.5* 23.3* 23.1* 24.3* 22.7*  ?MCV 102.0*   < > 100.0 103.6* 101.8* 102.5* 101.3*  ?PLT 124*   < > 97* 95* 98* 95* 100*  ? < > = values in this interval not displayed.  ? ?Basic Metabolic Panel: ?Recent Labs  ?Lab 05/06/21 ?9417 05/07/21 ?0030 05/07/21 ?4081 05/09/21 ?0125 05/10/21 ?0154  ?NA 136 134* 134* 134* 135  ?K 3.7 3.8 3.4* 3.4* 4.5  ?CL 97* 98 97* 100 101  ?CO2 '26 28 29 27 26  '$ ?GLUCOSE 103* 140* 112* 149* 142*  ?BUN '12 18 20 16 13  '$ ?CREATININE 1.40* 1.45* 1.34* 1.11* 1.04*  ?CALCIUM 8.6* 8.2* 7.9* 8.4* 8.7*  ?MG  --   --   --   --  2.0  ? ? ? ?Recent Results (from the past 240 hour(s))  ?Blood culture (routine x 2)     Status: None (Preliminary result)  ? Collection Time: 05/06/21  6:01 AM  ? Specimen: BLOOD  ?Result  Value Ref Range Status  ? Specimen Description BLOOD BLOOD LEFT ARM  Final  ? Special Requests   Final  ?  BOTTLES DRAWN AEROBIC AND ANAEROBIC Blood Culture adequate volume  ? Culture   Final  ?  NO GROWTH 4 DAYS ?Performed at [facility], [place] [address] [place], [place] [address] ?  ? Report Status PENDING  Incomplete  ?Blood culture (routine x 2)     Status: None (Preliminary result)  ? Collection Time: 05/06/21  3:08 PM  ? Specimen: BLOOD  ?Result Value Ref Range Status  ? Specimen Description BLOOD LEFT ANTECUBITAL  Final  ? Special Requests   Final  ?  BOTTLES DRAWN AEROBIC AND ANAEROBIC Blood Culture results may not be optimal due to an excessive volume of blood received in culture bottles  ? Culture   Final  ?  NO GROWTH 4 DAYS ?Performed at [facility], [place] [address], [place], [place] [address] ?  ? Report Status PENDING  Incomplete  ?Body fluid culture w Gram Stain     Status: None (Preliminary result)  ? Collection Time: 05/06/21  4:40 PM  ? Specimen:  Abdomen; Peritoneal Fluid  ?Result Value Ref Range Status  ? Specimen Description ABDOMEN  Final  ? Special Requests NONE  Final  ? Gram Stain   Final  ?  MODERATE WBC PRESENT, PREDOMINANTLY PMN ?NO ORGANISMS SEEN ?  ?

## 2021-05-11 ENCOUNTER — Inpatient Hospital Stay (HOSPITAL_COMMUNITY): Payer: Medicare HMO

## 2021-05-11 DIAGNOSIS — A419 Sepsis, unspecified organism: Secondary | ICD-10-CM | POA: Diagnosis not present

## 2021-05-11 DIAGNOSIS — R6520 Severe sepsis without septic shock: Secondary | ICD-10-CM | POA: Diagnosis not present

## 2021-05-11 HISTORY — PX: IR PARACENTESIS: IMG2679

## 2021-05-11 LAB — CBC
HCT: 22.8 % — ABNORMAL LOW (ref 36.0–46.0)
Hemoglobin: 7.6 g/dL — ABNORMAL LOW (ref 12.0–15.0)
MCH: 34.2 pg — ABNORMAL HIGH (ref 26.0–34.0)
MCHC: 33.3 g/dL (ref 30.0–36.0)
MCV: 102.7 fL — ABNORMAL HIGH (ref 80.0–100.0)
Platelets: 116 10*3/uL — ABNORMAL LOW (ref 150–400)
RBC: 2.22 MIL/uL — ABNORMAL LOW (ref 3.87–5.11)
RDW: 17 % — ABNORMAL HIGH (ref 11.5–15.5)
WBC: 6.2 10*3/uL (ref 4.0–10.5)
nRBC: 0 % (ref 0.0–0.2)

## 2021-05-11 LAB — CULTURE, BLOOD (ROUTINE X 2)
Culture: NO GROWTH
Culture: NO GROWTH
Special Requests: ADEQUATE

## 2021-05-11 MED ORDER — FOLIC ACID 1 MG PO TABS: 1.0000 mg | ORAL_TABLET | Freq: Every day | ORAL | 1 refills | Status: DC

## 2021-05-11 MED ORDER — THIAMINE HCL 100 MG PO TABS: 100.0000 mg | ORAL_TABLET | Freq: Every day | ORAL | 1 refills | Status: DC

## 2021-05-11 MED ORDER — LIDOCAINE HCL 1 % IJ SOLN
INTRAMUSCULAR | Status: AC
  Filled 2021-05-11: qty 20

## 2021-05-11 MED ORDER — CIPROFLOXACIN HCL 500 MG PO TABS
500.0000 mg | ORAL_TABLET | Freq: Every day | ORAL | 0 refills | Status: AC
Start: 2021-05-12 — End: 2021-06-11

## 2021-05-11 MED ORDER — LIDOCAINE HCL (PF) 1 % IJ SOLN
INTRAMUSCULAR | Status: DC | PRN
  Administered 2021-05-11: 10 mL

## 2021-05-11 MED ORDER — LOPERAMIDE HCL 2 MG PO CAPS: 2.0000 mg | ORAL_CAPSULE | ORAL | 0 refills | Status: DC | PRN

## 2021-05-11 NOTE — Discharge Summary (Signed)
Physician Discharge Summary  ?Carla Leonard:[id number] DOB: [birth year] DOA: 05/06/2021 ? ?PCP: Darrol Jump, NP ? ?Admit date: 05/06/2021 ?Discharge date: 05/11/2021 ? ?Admitted From: Home ?Disposition:  Home ? ?Discharge Condition:Stable ?CODE STATUS:FULL ?Diet recommendation: Low sodium diet ? ?Brief/Interim Summary: ?Patient is a 66 year old female with history of alcoholic cirrhosis with ascites, hydronephrosis, cholecystectomy, anxiety/depression/thrombocytopenia, recent diagnosis of left lower extremity DVT on warfarin who presented with severe abdominal pain, nausea, vomiting.  Patient was febrile, tachycardic, hypotensive with elevated lactate on presentation.  Patient was suspected to have severe due to spontaneous bacterial peritonitis and  was admitted for further management.  Started on antibiotics, GI was following.  Paracentesis confirmed SBP, s/p 5 days of IV abx: ceftriaxone.  Medically stable for discharge to home today with prophylactic oral antibiotic was will be continued for long-term.  She will follow-up with her hepatologist tomorrow . ? ?Following problems were addressed during her hospitalization: ? ?  ?Severe sepsis secondary to SBP: Presented with abdominal pain, nausea, vomiting, hypotension, tachycardia, elevated lactate.  Found to have SBP, confirmed with paracentesis.  She was treated with 5 days course of ceftriaxone.  On discharge will continue ciprofloxacin. ?  ?Alcoholic cirrhosis of liver with ascites: Paracentesis done x 2, removal of 3 L of ascitic fluid.  Patient following with [facility] transplant team for consideration of transplant.  We will resume home diuretics on discharge.  Continue low-sodium diet.  She might need for regular paracentesis in the future.Screening evaluation by the [facility] Portal Hypertension [facility] has been arranged. ?  ?Biliary duct dilation: MRCP showed moderate intrahepatic, extracardiac biliary duct dilatation.  Suspected to  be benign and physiologic postcholecystectomy.  No evidence of choledocholithiasis.  GI recommended outpatient follow-up, outpatient endoscopic ultrasound.  He will follow up with hepatologist, Dr. Lorenso Courier at [facility] on 5/10.  Hyperbilirubinemia improved ?  ?Acute blood loss anemia/macrocytic anemia/left arm hemorrhagic bulla: Found to be bruising/hematoma on the extremities.She has high risk of bleeding due to coagulopathy from liver cirrhosis, thrombocytopenia.  On anticoagulation at home which will be discontinued permanently.  Hemoglobin in the range of 7.  Continue to monitor, continue thiamine and folic acid.  Follow-up with PCP in a week to repeat CBC ?  ?History of DVT: Recently diagnosed with acute DVT involving the left popliteal vein: She was discharged on warfarin.  Now with significant bleeding from left forearm, bruising.  Also has thrombocytopenia.  High risk of bleeding so anticoagulation discontinued.  IR consulted for IVC filter but lower extremity Doppler did not show any DVT so there is no need of IVC filter as per IR. ?  ?Adrenal insufficiency: Chronic.  Continue stress dose steroids.  On Cortef.  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  ?AKI on  CKD stage IIa: Currently kidney function at baseline.  Was given IV albumin during this hospitalization. ?  ?Physical deconditioning: PT/OT evaluated him and recommended no outpatient follow-up ?  ?Seizure disorder: Continue home medication.  Stable ?  ?History of anxiety/depression: Continue home Xanax ?  ?Morbid obesity: BMI of 43.8 ?  ?  ? ?Discharge Diagnoses:  ?Principal Problem: ?  Severe sepsis due to SBP ?Active Problems: ?  Alcoholic cirrhosis of liver with ascites ([place]) ?  Acute kidney injury superimposed on chronic kidney disease ([place]) ?  Hypotension ?  Lactic acidosis ?  Adrenal insufficiency ([place]) ?  History of DVT (deep vein thrombosis) ?  Acute blood loss anemia ?  Goals of  care, counseling/discussion ?   Anxiety and depression ?  Hypokalemia ?  Hyperbilirubinemia ?  Hypoalbuminemia ?  Thrombocytopenia ([place]) ?  Seizure disorder ([place]) ?  Spontaneous bacterial peritonitis ([place]) ?  Obesity, Class III, BMI 40-49.9 (morbid obesity) ([place]) ?  Physical deconditioning ? ? ? ?Discharge Instructions ? ?Discharge Instructions   ? ? Diet - low sodium heart healthy   Complete by: As directed ?  ? Discharge instructions   Complete by: As directed ?  ? 1)Please follow up with your hepatologist tomorrow. ?2)Follow up with your PCP in a week,do a CBC test during the follow up  ? Increase activity slowly   Complete by: As directed ?  ? No wound care   Complete by: As directed ?  ? ?  ? ?Allergies as of 05/11/2021   ? ?   Reactions  ? Sulfa Antibiotics Anaphylaxis  ? Prednisone Other (See Comments)  ? hallucinations  ? Nsaids Other (See Comments)  ? Stomach pain, diarrhea  ? Other Other (See Comments)  ? Steroids: hallucinations  ? ?  ? ?  ?Medication List  ?  ? ?STOP taking these medications   ? ?enoxaparin 120 MG/0.8ML injection ?Commonly known as: LOVENOX ?  ?traZODone 100 MG tablet ?Commonly known as: DESYREL ?  ?warfarin 2.5 MG tablet ?Commonly known as: Coumadin ?  ? ?  ? ?TAKE these medications   ? ?ALPRAZolam 0.5 MG tablet ?Commonly known as: Duanne Moron ?Take 1 tablet by mouth 2 times a day as needed ?What changed:  ?how much to take ?how to take this ?when to take this ?reasons to take this ?  ?budesonide-formoterol 160-4.5 MCG/ACT inhaler ?Commonly known as: SYMBICORT ?Inhale 2 puffs into the lungs 2 (two) times daily as needed (shortness of breath/wheezing). ?  ?ciprofloxacin 500 MG tablet ?Commonly known as: Cipro ?Take 1 tablet (500 mg total) by mouth daily with breakfast. ?Start taking on: May 12, 2021 ?  ?feeding supplement Liqd ?Take 1 Container by mouth 2 (two) times daily as needed (appetite). ?  ?folic acid 1 MG tablet ?Commonly known as: FOLVITE ?Take 1 tablet (1 mg total) by mouth daily. ?Start taking on: May 12, 2021 ?   ?furosemide 20 MG tablet ?Commonly known as: LASIX ?Take 1 tablet (20 mg total) by mouth daily. ?What changed: how much to take ?  ?hydrocortisone 10 MG tablet ?Commonly known as: CORTEF ?Take 2 tablets (20 mg total) by mouth daily.  Do not stop it abruptly unless told by the endocrinologist. ?  ?levETIRAcetam 500 MG tablet ?Commonly known as: KEPPRA ?Take 1 tablet by mouth 2 times a day ?What changed:  ?how much to take ?how to take this ?when to take this ?  ?loperamide 2 MG capsule ?Commonly known as: IMODIUM ?Take 1 capsule (2 mg total) by mouth as needed for diarrhea or loose stools. ?  ?midodrine 10 MG tablet ?Commonly known as: PROAMATINE ?Take 1 tablet (10 mg total) by mouth 3 (three) times daily with meals. ?  ?nystatin 100000 UNIT/ML suspension ?Commonly known as: MYCOSTATIN ?Take 4 mLs by mouth 4 (four) times daily. ?  ?pantoprazole 40 MG tablet ?Commonly known as: PROTONIX ?Take 1 tablet (40 mg total) by mouth daily. ?  ?potassium chloride 10 MEQ tablet ?Commonly known as: KLOR-CON M ?Take 20 mEq by mouth daily. ?  ?primidone 50 MG tablet ?Commonly known as: MYSOLINE ?Take 100 mg by mouth every morning. ?  ?spironolactone 50 MG tablet ?Commonly known as: ALDACTONE ?Take 1 tablet (50  mg total) by mouth daily. ?  ?thiamine 100 MG tablet ?Take 1 tablet (100 mg total) by mouth daily. ?Start taking on: May 12, 2021 ?  ?traMADol 50 MG tablet ?Commonly known as: ULTRAM ?Take 25 mg by mouth every 8 (eight) hours as needed for moderate pain. ?  ? ?  ? ? Follow-up Information   ? ? Darrol Jump, NP. Schedule an appointment as soon as possible for a visit in 1 week(s).   ?Specialty: Nurse Practitioner ?Contact information: ?[address] ?[place] [address] ?[phone] ? ? ?  ?  ? ?  ?  ? ?  ? ?Allergies  ?Allergen Reactions  ? Sulfa Antibiotics Anaphylaxis  ? Prednisone Other (See Comments)  ?  hallucinations  ? Nsaids Other (See Comments)  ?  Stomach pain, diarrhea  ? Other Other (See Comments)  ?   Steroids: hallucinations  ? ? ?Consultations: ?GI ? ? ?Procedures/Studies: ?CT Abdomen Pelvis Wo Contrast ? ?Result Date: 04/27/2021 ?CLINICAL DATA:  Increasing ascites and lower extremity swelling. EXAM:

## 2021-05-11 NOTE — Progress Notes (Signed)
Mobility Specialist Progress Note: ? ? 05/11/21 1200  ?Mobility  ?Activity Ambulated independently in room  ?Level of Assistance Independent  ?Assistive Device None  ?Distance Ambulated (ft) 30 ft  ?Activity Response Tolerated well  ?$Mobility charge 1 Mobility  ? ?Pt received up in room. No complaints of pain. Pt declined further ambulation d/t just walking in the hallway.  ? ?Carla Leonard ?Mobility Specialist ?Primary Phone [phone] ? ?

## 2021-05-11 NOTE — Procedures (Signed)
PROCEDURE SUMMARY: ? ?Successful ultrasound guided paracentesis from the right lower quadrant.  ?Yielded 3 L of clear yellow fluid.  ?No immediate complications.  ?The patient tolerated the procedure well.  ? ?Specimen not sent for labs. ? ?EBL < 2 mL ? ?The patient has required >/=2 paracenteses in a 30 day period and a screening evaluation by the [facility] has been arranged. ? ? ?Soyla Dryer, AGACNP-BC ?[phone] ?05/11/2021, 11:11 AM ? ? ? ? ? ? ?

## 2021-05-11 NOTE — Progress Notes (Signed)
Discharge instructions reviewed with patients. Appointments reviewed, medication list reviewed. Patient discharged  ?

## 2021-05-11 NOTE — Progress Notes (Signed)
Mobility Specialist Progress Note: ? ? 05/11/21 1605  ?Mobility  ?Activity Ambulated with assistance in hallway  ?Level of Assistance Independent  ?Assistive Device None  ?Distance Ambulated (ft) 300 ft  ?Activity Response Tolerated well  ?$Mobility charge 1 Mobility  ? ?Pt received EOB willing to participate in mobility. No complaints of pain. Left EOB with call bell in reach and all needs met.  ? ?Carla Leonard ?Mobility Specialist ?Primary Phone [phone] ? ?

## 2021-05-11 NOTE — Progress Notes (Signed)
Patient concerns that she will be sent home in the morning without draining fluid in her stomach (getting paracentesis done). She was crying. This RN was able to calm her down by explaining to her that there is no way she will be sent home without her getting the necessary care she needs. We continue to monitor. ?

## 2021-05-12 ENCOUNTER — Ambulatory Visit: Payer: Medicare HMO | Admitting: Internal Medicine

## 2021-05-12 DIAGNOSIS — F411 Generalized anxiety disorder: Secondary | ICD-10-CM | POA: Diagnosis not present

## 2021-05-12 DIAGNOSIS — I509 Heart failure, unspecified: Secondary | ICD-10-CM | POA: Diagnosis not present

## 2021-05-12 DIAGNOSIS — K2100 Gastro-esophageal reflux disease with esophagitis, without bleeding: Secondary | ICD-10-CM | POA: Diagnosis not present

## 2021-05-12 DIAGNOSIS — G8929 Other chronic pain: Secondary | ICD-10-CM | POA: Diagnosis not present

## 2021-05-12 DIAGNOSIS — E2740 Unspecified adrenocortical insufficiency: Secondary | ICD-10-CM | POA: Diagnosis not present

## 2021-05-12 DIAGNOSIS — I951 Orthostatic hypotension: Secondary | ICD-10-CM | POA: Diagnosis not present

## 2021-05-12 DIAGNOSIS — M545 Low back pain, unspecified: Secondary | ICD-10-CM | POA: Diagnosis not present

## 2021-05-14 LAB — AFP TUMOR MARKER: AFP, Serum, Tumor Marker: 2 ng/mL (ref 0.0–9.2)

## 2021-05-15 NOTE — Progress Notes (Signed)
? ?  Portal Hypertension Clinic Screening Evaluation ? ? ?Indication for evaluation: ?Carla Leonard is a 66 y.o. female undergoing preliminary evaluation in the [facility] due to recurrent ascites. ? ?Established Gastroenterologist: Christia Reading, MD ? ?Etiology of cirrhosis: EtOH ?Initially diagnosed: March 2023 ?# of paracentesis in last month: 3 ?# of paracentesis in last 2 months: 3 ?History of hepatic hydrothorax:  No ?History of hepatic encephalopathy: No ? ?Prior evaluation for liver transplant: Referral to [facility] ?History of hepatocellular carcinoma: No ? ?Prior esophagogastroduodenoscopy/intervention: No ?Current esophageal varices: No ?Current gastric varices: No ?History of hematemesis: No ? ?Current diuretic regimen: furosemide 20 mg QD, spironolactone 50 mg QD ?Current pharmacologic encephalopathy prophylaxis/treatment: none ? ?History of renal dysfunction: CKD 3 ?History of hemodialysis: No ? ?History of cardiac dysfunction: No ? ?Other pertinent past medical history: nephrolithiasis, depression, hypertension, s/p cholecystectomy ? ? ?Imaging: ?Prior cross sectional imaging of portal system: ?CT AP 05/06/21 ? ?Patent portal system, small splenorenal shunt, no ectopic varices, intrahepatic venous anatomy amenable to TIPS creation ? ?Echocardiogram:  ?04/08/21 ? ? ?Labs: ?05/10/21 ?Creatinine: 1.04 ?Total Bilirubin: 1.5 ?INR: 1.4 ?Sodium: 135 ?Albumin: 3.3 ? ?Child-Pugh = 8 points, class B ?MELD = 12 (6.0% estimated 3 month mortality) ?[place] Index of Post-TIPS Survival (FIPS) = 0.61 (overall survival at 1 month 91.9%, 3 months 76.7%, and 6 months 68.0%) ? ? ? ?Assessment: ?Carla Leonard is a 66 y.o. female with history of alcoholic cirrhosis (Child Pugh B, MELD 12) and recurrent ascites.  After preliminary evaluation, this patient is a possible candidate for TIPS creation.  She is currently not maximized on diuretics, however this may be limited by  her renal function (CKD 3).  She has an upcoming liver transplant evaluation at [facility]. ? ?Recommendation: ?Formal consult for TIPS creation could be considered pending transplant evaluation and maximization of diuretics.  The patient's Gastroenterologist, Dr. Lorenso Courier, will be contacted for further discussion. ? ? ? ?Electronically Signed: ?Suzette Battiest, MD ?05/15/2021, 10:15 AM ? ? ? ?

## 2021-05-18 ENCOUNTER — Other Ambulatory Visit: Payer: Self-pay

## 2021-05-18 DIAGNOSIS — R188 Other ascites: Secondary | ICD-10-CM

## 2021-05-18 MED ORDER — FUROSEMIDE 20 MG PO TABS: 60.0000 mg | ORAL_TABLET | Freq: Every day | ORAL | 0 refills | Status: DC

## 2021-05-18 MED ORDER — SPIRONOLACTONE 100 MG PO TABS: 100.0000 mg | ORAL_TABLET | Freq: Every day | ORAL | 0 refills | Status: DC

## 2021-05-18 NOTE — Telephone Encounter (Signed)
Upon calling pt to inform her about changes to her medications, pt apologized for incorrectly informing me about her Lasix. Pt confirmed she incorrectly advised me about her Lasix. Rather than taking '20mg'$  daily, states she is taking '40mg'$  daily. Routing this message back to Dr. Lorenso Courier for a corrected dosage to Lasix.  ? ?New Rx has been sent to pt pharmacy for Spironolactone to reflect '100mg'$  daily dosing. Order placed for repeat labs in 1 week. Reminder created to ensure pt completes labs in a timely manner. Will schedule f/u appt once I receive new Lasix orders from Dr. Lorenso Courier. ?

## 2021-05-18 NOTE — Telephone Encounter (Signed)
MAR updated to reflect new order for Lasix. Pt declined refill at this time, stating she just received a refill from her [employer]. ?

## 2021-05-18 NOTE — Telephone Encounter (Signed)
I am not able to get her a sooner appt with any APP until 05/25/21 with Tye Savoy, NP. Do you want to just keep the appt as scheduled with you on 05/27/21, or move appt to 05/25/21? ?

## 2021-05-18 NOTE — Telephone Encounter (Signed)
Returned pt call to inquire further. Confirmed she is taking Lasix '20mg'$  qd and Aldactone '50mg'$  qd but has noticed that her feet and legs are so swollen that it is making it very difficult to ambulate. Denies dyspnea but states she is noticing that her clothes are fitting more snug, especially in the abdominal region. Last paracentesis 05/06/21 with 3 L having been removed. Next f/u appt 05/27/21 but states she cannot wait until that follow up appt to be seen. Feels she is needing a repeat paracentesis. Routing this message to Dr. Lorenso Courier for her to review and advise further. ?

## 2021-05-18 NOTE — Telephone Encounter (Signed)
Called pt and informed we will plan to keep f/u appt as scheduled with Dr. Lorenso Courier on 05/27/21. Reminded to ensure she adheres to < 2g sodium diet as previously mentioned, and to take Lasix and Aldactone as directed by Dr. Lorenso Courier. Reminded she will need to ensure she completes labs 05/25/21. Verbalized acceptance and understanding. Requested I call Home Health nurse with [facility] @ [phone] to inform about need for labs. ? ?[facility] @ [phone] and spoke with Aaron Edelman. Advised about need for BMP to be obtained in 05/25/21 and results faxed to our office at [phone]. Requested orders be faxed to [phone]. If unable to obtain as per our request, will call me back.  ? ?Orders faxed with confirmation received. Reminder created to ensure results have been received in a timely manner. ?

## 2021-05-18 NOTE — Telephone Encounter (Signed)
We received a call from patient. Per patient, needs TAP done. Please advise. ?

## 2021-05-20 DIAGNOSIS — Z7682 Awaiting organ transplant status: Secondary | ICD-10-CM | POA: Diagnosis not present

## 2021-05-20 DIAGNOSIS — K838 Other specified diseases of biliary tract: Secondary | ICD-10-CM | POA: Diagnosis not present

## 2021-05-20 DIAGNOSIS — K7460 Unspecified cirrhosis of liver: Secondary | ICD-10-CM | POA: Diagnosis not present

## 2021-05-25 ENCOUNTER — Ambulatory Visit: Payer: Medicare HMO | Admitting: Nurse Practitioner

## 2021-05-26 DIAGNOSIS — I130 Hypertensive heart and chronic kidney disease with heart failure and stage 1 through stage 4 chronic kidney disease, or unspecified chronic kidney disease: Secondary | ICD-10-CM | POA: Diagnosis not present

## 2021-05-26 DIAGNOSIS — A419 Sepsis, unspecified organism: Secondary | ICD-10-CM | POA: Diagnosis not present

## 2021-05-26 NOTE — Telephone Encounter (Signed)
[facility] and spoke with Yolanda re: need for lab results. Was transferred to [facility] who states he is not able to locate results at this time. Advised he will reach out to nurse to determine where she dropped them off and to try to obtain results. States he will call me back with an update.

## 2021-05-26 NOTE — Telephone Encounter (Signed)
Thanks Ammie. Received results of BMP, which shows a normal sodium of 139, normal potassium of 4.7. Creatinine did go up slightly to 1.3, which could reflect the increase in her diuretic regimen. Patient is scheduled to see me in clinic tomorrow so I will plan to assess her volume status at that time to decide if we need to adjust her diuretics.

## 2021-05-26 NOTE — Telephone Encounter (Signed)
Received call from Marcene Brawn, [place] with [facility]. States she was unable to "stick" pt yesterday (5/23) d/t poor circulation from dehydration. However, states she was able to "stick" pt today (5/24). Specimen has been dropped off at [place] draw site and asked to run as STAT. Results to be faxed to our office at [phone]. Routing this message to Dr. Lorenso Courier to make her aware.

## 2021-05-27 ENCOUNTER — Ambulatory Visit (INDEPENDENT_AMBULATORY_CARE_PROVIDER_SITE_OTHER): Payer: Medicare HMO | Admitting: Internal Medicine

## 2021-05-27 ENCOUNTER — Encounter: Payer: Self-pay | Admitting: Internal Medicine

## 2021-05-27 ENCOUNTER — Ambulatory Visit: Payer: Medicare HMO | Admitting: Internal Medicine

## 2021-05-27 ENCOUNTER — Other Ambulatory Visit (INDEPENDENT_AMBULATORY_CARE_PROVIDER_SITE_OTHER): Payer: Medicare HMO

## 2021-05-27 VITALS — BP 120/70 | HR 88 | Ht 61.0 in | Wt 232.0 lb

## 2021-05-27 DIAGNOSIS — R188 Other ascites: Secondary | ICD-10-CM

## 2021-05-27 DIAGNOSIS — R6889 Other general symptoms and signs: Secondary | ICD-10-CM | POA: Diagnosis not present

## 2021-05-27 DIAGNOSIS — K7031 Alcoholic cirrhosis of liver with ascites: Secondary | ICD-10-CM

## 2021-05-27 DIAGNOSIS — D649 Anemia, unspecified: Secondary | ICD-10-CM

## 2021-05-27 DIAGNOSIS — K838 Other specified diseases of biliary tract: Secondary | ICD-10-CM | POA: Diagnosis not present

## 2021-05-27 LAB — CBC WITH DIFFERENTIAL/PLATELET
Basophils Absolute: 0.1 10*3/uL (ref 0.0–0.1)
Basophils Relative: 0.7 % (ref 0.0–3.0)
Eosinophils Absolute: 0 10*3/uL (ref 0.0–0.7)
Eosinophils Relative: 0.5 % (ref 0.0–5.0)
HCT: 31.7 % — ABNORMAL LOW (ref 36.0–46.0)
Hemoglobin: 10.6 g/dL — ABNORMAL LOW (ref 12.0–15.0)
Lymphocytes Relative: 6.8 % — ABNORMAL LOW (ref 12.0–46.0)
Lymphs Abs: 0.5 10*3/uL — ABNORMAL LOW (ref 0.7–4.0)
MCHC: 33.4 g/dL (ref 30.0–36.0)
MCV: 100.9 fl — ABNORMAL HIGH (ref 78.0–100.0)
Monocytes Absolute: 0.4 10*3/uL (ref 0.1–1.0)
Monocytes Relative: 5.8 % (ref 3.0–12.0)
Neutro Abs: 6.1 10*3/uL (ref 1.4–7.7)
Neutrophils Relative %: 86.2 % — ABNORMAL HIGH (ref 43.0–77.0)
Platelets: 96 10*3/uL — ABNORMAL LOW (ref 150.0–400.0)
RBC: 3.14 Mil/uL — ABNORMAL LOW (ref 3.87–5.11)
RDW: 16.5 % — ABNORMAL HIGH (ref 11.5–15.5)
WBC: 7 10*3/uL (ref 4.0–10.5)

## 2021-05-27 NOTE — Progress Notes (Addendum)
Chief Complaint: EtOH cirrhosis  HPI : 66 year old female with history of EtOH cirrhosis, DVT, obesity, seizures, thrombocytopenia, adrenal insufficiency presents for follow-up of cirrhosis  Interval History: Patient states that she is currently feeling better.  She does feel like her abdominal and lower extremity swelling has improved with the increase in her diuretic regimen.  She is currently taking Lasix 30 mg daily and spironolactone 100 mg daily.  She has been compliant with her other medications including her hydrocortisone, ciprofloxacin, and midodrine.  She does manage her own medications and is very diligent about making sure that she gets the correct medications.  Denies blood in the stools, confusion, shortness of breath, lightheadedness, abdominal pain.  She has not had alcohol for years.  She quit smoking early last month.  She has been compliant with a low-sodium diet.  Has been following with [facility] liver transplant.  Unfortunately she was recently told that her insurance would not cover the cost of the surgery.  However she thinks that she may be able to save up funds to be able to pay for the transplant surgery eventually.  Her last paracentesis was during her recent hospitalization for SBP.  Current Outpatient Medications  Medication Sig Dispense Refill   ALPRAZolam (XANAX) 0.5 MG tablet Take 1 tablet by mouth 2 times a day as needed (Patient taking differently: Take 0.5 mg by mouth 2 (two) times daily as needed for anxiety.) 60 tablet 2   budesonide-formoterol (SYMBICORT) 160-4.5 MCG/ACT inhaler Inhale 2 puffs into the lungs 2 (two) times daily as needed (shortness of breath/wheezing).     ciprofloxacin (CIPRO) 500 MG tablet Take 1 tablet (500 mg total) by mouth daily with breakfast. 30 tablet 0   feeding supplement (BOOST HIGH PROTEIN) LIQD Take 1 Container by mouth 2 (two) times daily as needed (appetite).     folic acid (FOLVITE) 1 MG tablet Take 1 tablet (1 mg total) by mouth  daily. 30 tablet 1   furosemide (LASIX) 20 MG tablet Take 3 tablets (60 mg total) by mouth daily. 90 tablet 0   hydrocortisone (CORTEF) 10 MG tablet Take 2 tablets (20 mg total) by mouth daily.  Do not stop it abruptly unless told by the endocrinologist. 60 tablet 1   levETIRAcetam (KEPPRA) 500 MG tablet Take 1 tablet by mouth 2 times a day (Patient taking differently: Take 500 mg by mouth 2 (two) times daily.) 60 tablet 10   loperamide (IMODIUM) 2 MG capsule Take 1 capsule (2 mg total) by mouth as needed for diarrhea or loose stools. 15 capsule 0   midodrine (PROAMATINE) 10 MG tablet Take 1 tablet (10 mg total) by mouth 3 (three) times daily with meals. 90 tablet 0   nystatin (MYCOSTATIN) 100000 UNIT/ML suspension Take 4 mLs by mouth 4 (four) times daily.     pantoprazole (PROTONIX) 40 MG tablet Take 1 tablet (40 mg total) by mouth daily. 30 tablet 0   potassium chloride (KLOR-CON M) 10 MEQ tablet Take 20 mEq by mouth daily.     primidone (MYSOLINE) 50 MG tablet Take 100 mg by mouth every morning.     spironolactone (ALDACTONE) 100 MG tablet Take 1 tablet (100 mg total) by mouth daily. 30 tablet 0   thiamine 100 MG tablet Take 1 tablet (100 mg total) by mouth daily. 30 tablet 1   traMADol (ULTRAM) 50 MG tablet Take 25 mg by mouth every 8 (eight) hours as needed for moderate pain.     No current facility-administered  medications for this visit.   Review of Systems: All systems reviewed and negative except where noted in HPI.   Physical Exam: BP 120/70   Pulse 88   Ht 5' 1"  (1.549 m)   Wt 232 lb (105.2 kg)   BMI 43.84 kg/m  Constitutional: Pleasant,well-developed, female in no acute distress. HEENT: Normocephalic and atraumatic. Conjunctivae are normal. No scleral icterus. Cardiovascular: Normal rate, regular rhythm.  Pulmonary/chest: Effort normal and breath sounds normal. No wheezing, rales or rhonchi. Abdominal: Soft, distended, non-tender Extremities: 1+ BLE edema Neurological:  Alert and oriented to person place and time. Skin: Skin is warm and dry. No rashes noted. Psychiatric: Normal mood and affect. Behavior is normal.  Labs 02/2021: CMP with elevated CR of 1.3, AST 59, and alk phos 136. CBC with low Hb of 10.7, MCV 102.5, low plts 135. INR is elevated 1.4.  Labs 03/2021: ANA negative. AMA negative. ASMA negative. IgG nml. Hep B surface antigen negative. Hep B surface antibody negative. HCV antibody negative. Hep A antibody positive.  Labs 05/2021: BMP with Cr of 1.3, BUN of 18. CBC with Hb of 7.6.  MELD-Na score: 14 at 05/10/2021  1:54 AM MELD score: 12 at 05/10/2021  1:54 AM Calculated from: Serum Creatinine: 1.04 mg/dL at 05/10/2021  1:54 AM Serum Sodium: 135 mmol/L at 05/10/2021  1:54 AM Total Bilirubin: 1.5 mg/dL at 05/10/2021  1:54 AM INR(ratio): 1.4 at 05/10/2021  1:54 AM Age: 68 years  CT A/P w/contrast 02/01/21: IMPRESSION: 1. Small enhancing fluid collections in the subcutaneous tissues at the level of the umbilicus measuring 3.1 x 0.9 x 2.2 cm. Abscess not excluded. No evidence for soft tissue gas. 2. Anterior abdominal wall subcutaneous stranding and skin thickening may be related to cellulitis. 3. Significant decrease in ascites.  Mild ascites persists. 4. Stable splenomegaly. 5.  Aortic Atherosclerosis (ICD10-I70.0).  CTA A/P w/contrast 05/06/21: IMPRESSION: VASCULAR 1. No acute vascular abnormality of the abdomen or pelvis. NON-VASCULAR 1. Interval worsening of intra and extrahepatic bile duct dilatation. Consider further evaluation with MRI/MRCP when patient condition allows to exclude distal obstructing stone, stricture, or mass. 2. Cirrhotic liver morphology without focal hepatic lesion. 3. Mild abdominal ascites.  MRCP 05/07/21: IMPRESSION: 1. Patient is status post cholecystectomy with moderate intra and extrahepatic biliary ductal dilatation, favored to reflect benign post cholecystectomy physiology. No choledocholithiasis or other source of  obstruction identified. The possibility of a very small ampullary mass is not excluded, but not strongly favored on the basis of today's examination. Further evaluation with endoscopic ultrasound/ERCP could be considered if clinically appropriate. 2. Cirrhosis. 3. Small volume of ascites.  ASSESSMENT AND PLAN: Alcoholic cirrhosis Anemia History of SBP Moderate intrahepatic and extrahepatic biliary ductal dilation Patient presents with alcoholic cirrhosis for which she has been recently diagnosed with SBP.  Patient is doing overall fairly well today.  We will plan to continue her diuretic regimen at its current dose since her creatinine had increased recently.  No signs of hepatic encephalopathy or GI bleeding today.  Will go ahead and recheck her labs today to make sure that her blood counts are not dropping. She was noted on recent MRI/MRCP to have biliary ductal dilation, will plan to discuss with my biliary colleagues. - Continue low-sodium diet of less than 2 g/day - Continue Lasix 30 mg daily and spironolactone 100 mg daily.  Can consider increasing in the future - Check CMP, CBC, PT/INR - Recheck BMP in 2 weeks - Continue ciprofloxacin for SBP prophylaxis - EGD [facility]  for variceal screening. Will also plan to discuss with my biliary colleagues with or not an EUS would be indicated for her biliary ductal dilation - Will need hepatitis B vaccination in the future - Patient was previously given Cologuard for colon cancer screening - Patient is following with [facility] transplant - RTC in 1 month  Christia Reading, MD  I spent 41 minutes of time, including in depth chart review, independent review of results as outlined above, communicating results with the patient directly, face-to-face time with the patient, coordinating care, ordering studies and medications as appropriate, and documentation.

## 2021-05-27 NOTE — Patient Instructions (Addendum)
If you are age 66 or older, your body mass index should be between 23-30. Your Body mass index is 43.84 kg/m. If this is out of the aforementioned range listed, please consider follow up with your Primary Care Provider.  If you are age 51 or younger, your body mass index should be between 19-25. Your Body mass index is 43.84 kg/m. If this is out of the aformentioned range listed, please consider follow up with your Primary Care Provider.   ________________________________________________________  The [facility] providers would like to encourage you to use [facility] to communicate with providers for non-urgent requests or questions.  Due to long hold times on the telephone, sending your provider a message by [facility] may be a faster and more efficient way to get a response.  Please allow 48 business hours for a response.  Please remember that this is for non-urgent requests.  _______________________________________________________  Your provider has requested that you go to the basement level for lab work before leaving today. Press "B" on the elevator. The lab is located at the first door on the left as you exit the elevator.  You will need a BMP in 2 weeks.  Your nurse from [facility] will draw these labs.  Please fax results to attn: [phone].  Due to recent changes in healthcare laws, you may see the results of your imaging and laboratory studies on MyChart before your provider has had a chance to review them.  We understand that in some cases there may be results that are confusing or concerning to you. Not all laboratory results come back in the same time frame and the provider may be waiting for multiple results in order to interpret others.  Please give [place] 48 hours in order for your provider to thoroughly review all the results before contacting the office for clarification of your results.   You will need to have an EGD at [facility] for variceal screening.  We do not  have our schedule available at this time.  We will contact you to get this scheduled.  You are scheduled to follow up on 06-29-21 at 10:50am.  Thank you for entrusting me with your care and choosing [facility].  Dr Lorenso Courier

## 2021-05-28 ENCOUNTER — Telehealth: Payer: Self-pay | Admitting: Internal Medicine

## 2021-05-28 ENCOUNTER — Telehealth: Payer: Self-pay

## 2021-05-28 LAB — COMPREHENSIVE METABOLIC PANEL
ALT: 21 U/L (ref 0–35)
AST: 27 U/L (ref 0–37)
Albumin: 3.3 g/dL — ABNORMAL LOW (ref 3.5–5.2)
Alkaline Phosphatase: 148 U/L — ABNORMAL HIGH (ref 39–117)
BUN: 19 mg/dL (ref 6–23)
CO2: 28 mEq/L (ref 19–32)
Calcium: 9.3 mg/dL (ref 8.4–10.5)
Chloride: 98 mEq/L (ref 96–112)
Creatinine, Ser: 1.39 mg/dL — ABNORMAL HIGH (ref 0.40–1.20)
GFR: 39.68 mL/min — ABNORMAL LOW (ref >60.00)
Glucose, Bld: 142 mg/dL — ABNORMAL HIGH (ref 70–99)
Potassium: 4.3 mEq/L (ref 3.5–5.1)
Sodium: 138 mEq/L (ref 135–145)
Total Bilirubin: 1.2 mg/dL (ref 0.2–1.2)
Total Protein: 6.9 g/dL (ref 6.0–8.3)

## 2021-05-28 LAB — PROTIME-INR
INR: 1.2 ratio — ABNORMAL HIGH (ref 0.8–1.0)
Prothrombin Time: 13.4 s — ABNORMAL HIGH (ref 9.6–13.1)

## 2021-05-28 NOTE — Telephone Encounter (Signed)
Sharyn Creamer, MD 1 minute ago (9:11 AM)   Spoke to the patient and updated her that after speaking with my biliary colleagues, they felt it would be reasonable to pursue EUS for further evaluation of the dilated bile ducts. At the same time she could get her variceal screening done so there would be no need for a separate EGD procedure. Patient was okay with this plan.       Note

## 2021-05-28 NOTE — Telephone Encounter (Signed)
Spoke to the patient and updated her that after speaking with my biliary colleagues, they felt it would be reasonable to pursue EUS for further evaluation of the dilated bile ducts. At the same time she could get her variceal screening done so there would be no need for a separate EGD procedure. Patient was okay with this plan.

## 2021-05-28 NOTE — Telephone Encounter (Signed)
-----   Message from Milus Banister, MD sent at 05/28/2021  7:10 AM EDT ----- Lyndee Leo, I think it is probably a good idea to check for ampullary growth, small bile duct stones.  I would probably book it without ERCP but if she is found to have something that requires 1 that it could be done at a later date.  At the same time we could get a very good look for signs of portal hypertension.   Kaesyn Johnston, Okay to book for endoscopic ultrasound with either myself or Gabe, first available for abnormal bile ducts.  Wynetta Fines  Thanks all.   ----- Message ----- From: Sharyn Creamer, MD Sent: 05/27/2021   5:53 PM EDT To: Milus Banister, MD, #  Salley Scarlet Fletcher Anon, I wanted to check to see if you guys think it would be worth considering an EUS for this patient to evaluate her intrahepatic and extrahepatic biliary ductal dilation? I was going to get her in for an EGD for variceal screening, but that could be completed at the same time as an EUS if that is needed. Let me know.  Thanks, Lyndee Leo

## 2021-06-03 ENCOUNTER — Other Ambulatory Visit: Payer: Self-pay

## 2021-06-03 ENCOUNTER — Telehealth: Payer: Self-pay | Admitting: Internal Medicine

## 2021-06-03 DIAGNOSIS — K838 Other specified diseases of biliary tract: Secondary | ICD-10-CM

## 2021-06-03 NOTE — Telephone Encounter (Signed)
Inbound call from patient stating that her home health nurse was supposed to draw blood from and has not done it. Patient is seeking advice if she needs to have more drawn since when she came to see Dr. Lorenso Courier she had blood drawn. Please advise.

## 2021-06-03 NOTE — Telephone Encounter (Signed)
The pt has been scheduled for 08/09/21 at [facility] with [facility] for EUS   Left message on machine to call back

## 2021-06-04 NOTE — Telephone Encounter (Signed)
Returned call to the patient regarding future lab work.  Patient states that she has "graduated" from home health and they will no longer be visiting her home for care.  Patient advised that she can come to our office for lab work on 06-10-21. Patient will make arrangements for transportation to bring her for BMET on 06-10-21 per Dr Libby Maw recommendation.  Patient agreed to plan and verbalized understanding.  No further questions.

## 2021-06-04 NOTE — Telephone Encounter (Signed)
EUS scheduled, pt instructed and medications reviewed.  Patient instructions mailed to home.  Patient to call with any questions or concerns.  

## 2021-06-07 ENCOUNTER — Telehealth: Payer: Self-pay | Admitting: Internal Medicine

## 2021-06-07 NOTE — Telephone Encounter (Signed)
The pt had questions about 2 medications she was prescribed.  Both were prescribed by her PCP.  She was advised to call that office.  She wants them sent to another pharmacy.  The pt has been advised of the information and verbalized understanding.

## 2021-06-07 NOTE — Telephone Encounter (Signed)
Inbound call from patient stating she would like to discuss medications with nurse. Please advise.

## 2021-06-09 ENCOUNTER — Telehealth: Payer: Self-pay | Admitting: Internal Medicine

## 2021-06-09 NOTE — Telephone Encounter (Signed)
Patient called stating she was supposed to have some bloodwork either today or tomorrow.  She needs to know asap as she has transportation lined up to come get her about noon.  Please call and advise.  Thank you.

## 2021-06-09 NOTE — Telephone Encounter (Signed)
Pt was supposed to come tomorrow for labs but arranged her transportation for today. States if she does not come today she won't be able to come until next week. Pt will come today for lab. Dr. Lorenso Courier notified.

## 2021-06-10 ENCOUNTER — Other Ambulatory Visit: Payer: Medicare HMO

## 2021-06-10 DIAGNOSIS — K7031 Alcoholic cirrhosis of liver with ascites: Secondary | ICD-10-CM

## 2021-06-10 LAB — BASIC METABOLIC PANEL
BUN: 19 mg/dL (ref 6–23)
CO2: 28 mEq/L (ref 19–32)
Calcium: 8.8 mg/dL (ref 8.4–10.5)
Chloride: 100 mEq/L (ref 96–112)
Creatinine, Ser: 1.4 mg/dL — ABNORMAL HIGH (ref 0.40–1.20)
GFR: 39.33 mL/min — ABNORMAL LOW (ref >60.00)
Glucose, Bld: 122 mg/dL — ABNORMAL HIGH (ref 70–99)
Potassium: 4.1 mEq/L (ref 3.5–5.1)
Sodium: 136 mEq/L (ref 135–145)

## 2021-06-29 ENCOUNTER — Ambulatory Visit (INDEPENDENT_AMBULATORY_CARE_PROVIDER_SITE_OTHER): Payer: Medicare HMO | Admitting: Internal Medicine

## 2021-06-29 ENCOUNTER — Encounter: Payer: Self-pay | Admitting: Internal Medicine

## 2021-06-29 VITALS — BP 110/60 | HR 76 | Ht 61.0 in | Wt 220.0 lb

## 2021-06-29 DIAGNOSIS — Z23 Encounter for immunization: Secondary | ICD-10-CM

## 2021-06-29 DIAGNOSIS — K7031 Alcoholic cirrhosis of liver with ascites: Secondary | ICD-10-CM | POA: Diagnosis not present

## 2021-06-29 MED ORDER — CIPROFLOXACIN HCL 500 MG PO TABS: 500.0000 mg | ORAL_TABLET | Freq: Every day | ORAL | 5 refills | Status: DC

## 2021-08-02 ENCOUNTER — Encounter (HOSPITAL_COMMUNITY): Payer: Self-pay | Admitting: Gastroenterology

## 2021-08-02 NOTE — Progress Notes (Signed)
Attempted to obtain medical history via telephone, unable to reach at this time. HIPAA compliant voicemail message left requesting return call to pre surgical testing department. 

## 2021-08-05 ENCOUNTER — Telehealth: Payer: Self-pay

## 2021-08-05 ENCOUNTER — Ambulatory Visit (INDEPENDENT_AMBULATORY_CARE_PROVIDER_SITE_OTHER): Payer: Medicare HMO | Admitting: Internal Medicine

## 2021-08-05 DIAGNOSIS — Z23 Encounter for immunization: Secondary | ICD-10-CM

## 2021-08-05 NOTE — Telephone Encounter (Signed)
Patient is requesting a medication refill of Cipro 500 take daily to be sent to her [place] please. She said that Dr Lorenso Courier told her that she needed to stay on this medication until after her liver transplant to keep infection down. She said she is going to [facility] today to get her refill of Cipro but her next refill she would like it to be coming from her mail order if possible. Also too she would like are a refill of promethazine 12.'5mg'$  take Q6H prn be sent to the [place] too. She says she keep this medication on hand because every so often she gets queasy due to the amount of medications she takes although she tries to take her medications with food. Please advise about refill on Cipro and promethazine please

## 2021-08-06 MED ORDER — CIPROFLOXACIN HCL 500 MG PO TABS: 500.0000 mg | ORAL_TABLET | Freq: Every day | ORAL | 5 refills | Status: DC

## 2021-08-06 MED ORDER — PROMETHAZINE HCL 12.5 MG PO TABS: 12.5000 mg | ORAL_TABLET | Freq: Four times a day (QID) | ORAL | 0 refills | Status: DC | PRN

## 2021-08-06 NOTE — Telephone Encounter (Signed)
Dr. Lorenso Courier, [place] to refill requested medications?

## 2021-08-06 NOTE — Telephone Encounter (Signed)
Refill for Phenergan and Cipro sent to [facility] per pt request.

## 2021-08-08 NOTE — Anesthesia Preprocedure Evaluation (Addendum)
Anesthesia Evaluation  Patient identified by MRN, date of birth, ID band Patient awake    Reviewed: Allergy & Precautions, NPO status , Patient's Chart, lab work & pertinent test results  Airway Mallampati: I  TM Distance: >3 FB Neck ROM: Full    Dental no notable dental hx. (+) Edentulous Upper, Edentulous Lower   Pulmonary former smoker,    Pulmonary exam normal breath sounds clear to auscultation       Cardiovascular hypertension, + DVT  Normal cardiovascular exam Rhythm:Regular Rate:Normal     Neuro/Psych Seizures -, Well Controlled,  PSYCHIATRIC DISORDERS Anxiety Depression    GI/Hepatic (+) Cirrhosis   ascites  substance abuse  alcohol use,   Endo/Other  Morbid obesity  Renal/GU      Musculoskeletal   Abdominal   Peds  Hematology   Anesthesia Other Findings All: sulfa ,prednisone, NSAIDS  Reproductive/Obstetrics                            Anesthesia Physical Anesthesia Plan  ASA: 3  Anesthesia Plan: MAC   Post-op Pain Management: Minimal or no pain anticipated   Induction: Intravenous  PONV Risk Score and Plan: 2 and Propofol infusion, TIVA and Treatment may vary due to age or medical condition  Airway Management Planned: Natural Airway, Simple Face Mask and Nasal Cannula  Additional Equipment: None  Intra-op Plan:   Post-operative Plan:   Informed Consent: I have reviewed the patients History and Physical, chart, labs and discussed the procedure including the risks, benefits and alternatives for the proposed anesthesia with the patient or authorized representative who has indicated his/her understanding and acceptance.     Dental advisory given  Plan Discussed with:   Anesthesia Plan Comments: (UPPER ENDOSCOPIC ULTRASOUND (EUS) RADIAL for abnormal bile ducts)       Anesthesia Quick Evaluation

## 2021-08-09 ENCOUNTER — Ambulatory Visit (HOSPITAL_BASED_OUTPATIENT_CLINIC_OR_DEPARTMENT_OTHER): Payer: Medicare HMO | Admitting: Anesthesiology

## 2021-08-09 ENCOUNTER — Ambulatory Visit (HOSPITAL_COMMUNITY): Payer: Medicare HMO | Admitting: Anesthesiology

## 2021-08-09 ENCOUNTER — Ambulatory Visit (HOSPITAL_COMMUNITY)
Admission: RE | Admit: 2021-08-09 | Discharge: 2021-08-09 | Disposition: A | Discharge status: Home / Self Care | Payer: Medicare HMO | Source: Ambulatory Visit | Attending: Gastroenterology | Admitting: Gastroenterology

## 2021-08-09 ENCOUNTER — Encounter (HOSPITAL_COMMUNITY)
Admission: RE | Disposition: A | Discharge status: Home / Self Care | Payer: Self-pay | Source: Ambulatory Visit | Attending: Gastroenterology

## 2021-08-09 ENCOUNTER — Other Ambulatory Visit: Payer: Self-pay

## 2021-08-09 ENCOUNTER — Encounter (HOSPITAL_COMMUNITY): Payer: Self-pay | Admitting: Gastroenterology

## 2021-08-09 DIAGNOSIS — K3189 Other diseases of stomach and duodenum: Secondary | ICD-10-CM | POA: Insufficient documentation

## 2021-08-09 DIAGNOSIS — K31A0 Gastric intestinal metaplasia, unspecified: Secondary | ICD-10-CM | POA: Insufficient documentation

## 2021-08-09 DIAGNOSIS — K8020 Calculus of gallbladder without cholecystitis without obstruction: Secondary | ICD-10-CM | POA: Diagnosis not present

## 2021-08-09 DIAGNOSIS — K838 Other specified diseases of biliary tract: Secondary | ICD-10-CM

## 2021-08-09 DIAGNOSIS — K31811 Angiodysplasia of stomach and duodenum with bleeding: Secondary | ICD-10-CM | POA: Insufficient documentation

## 2021-08-09 DIAGNOSIS — K317 Polyp of stomach and duodenum: Secondary | ICD-10-CM | POA: Insufficient documentation

## 2021-08-09 DIAGNOSIS — K7460 Unspecified cirrhosis of liver: Secondary | ICD-10-CM

## 2021-08-09 DIAGNOSIS — I8510 Secondary esophageal varices without bleeding: Secondary | ICD-10-CM | POA: Diagnosis not present

## 2021-08-09 DIAGNOSIS — K2980 Duodenitis without bleeding: Secondary | ICD-10-CM | POA: Insufficient documentation

## 2021-08-09 DIAGNOSIS — K766 Portal hypertension: Secondary | ICD-10-CM

## 2021-08-09 DIAGNOSIS — Z6841 Body Mass Index (BMI) 40.0 and over, adult: Secondary | ICD-10-CM | POA: Diagnosis not present

## 2021-08-09 DIAGNOSIS — I8511 Secondary esophageal varices with bleeding: Secondary | ICD-10-CM

## 2021-08-09 DIAGNOSIS — K8010 Calculus of gallbladder with chronic cholecystitis without obstruction: Secondary | ICD-10-CM | POA: Insufficient documentation

## 2021-08-09 DIAGNOSIS — Z86718 Personal history of other venous thrombosis and embolism: Secondary | ICD-10-CM | POA: Diagnosis not present

## 2021-08-09 DIAGNOSIS — I10 Essential (primary) hypertension: Secondary | ICD-10-CM | POA: Diagnosis not present

## 2021-08-09 HISTORY — PX: SUBMUCOSAL TATTOO INJECTION: SHX6856

## 2021-08-09 HISTORY — PX: HOT HEMOSTASIS: SHX5433

## 2021-08-09 HISTORY — PX: HEMOSTASIS CLIP PLACEMENT: SHX6857

## 2021-08-09 HISTORY — PX: POLYPECTOMY: SHX5525

## 2021-08-09 HISTORY — PX: BIOPSY: SHX5522

## 2021-08-09 HISTORY — PX: EUS: SHX5427

## 2021-08-09 HISTORY — PX: ESOPHAGOGASTRODUODENOSCOPY: SHX5428

## 2021-08-09 LAB — COMPREHENSIVE METABOLIC PANEL
ALT: 26 U/L (ref 0–44)
AST: 36 U/L (ref 15–41)
Albumin: 3.2 g/dL — ABNORMAL LOW (ref 3.5–5.0)
Alkaline Phosphatase: 122 U/L (ref 38–126)
Anion gap: 11 (ref 5–15)
BUN: 34 mg/dL — ABNORMAL HIGH (ref 8–23)
CO2: 25 mmol/L (ref 22–32)
Calcium: 9 mg/dL (ref 8.9–10.3)
Chloride: 95 mmol/L — ABNORMAL LOW (ref 98–111)
Creatinine, Ser: 1.81 mg/dL — ABNORMAL HIGH (ref 0.44–1.00)
GFR, Estimated: 30 mL/min — ABNORMAL LOW (ref >60)
Glucose, Bld: 179 mg/dL — ABNORMAL HIGH (ref 70–99)
Potassium: 4.1 mmol/L (ref 3.5–5.1)
Sodium: 131 mmol/L — ABNORMAL LOW (ref 135–145)
Total Bilirubin: 0.9 mg/dL (ref 0.3–1.2)
Total Protein: 7.1 g/dL (ref 6.5–8.1)

## 2021-08-09 LAB — CBC
HCT: 32.9 % — ABNORMAL LOW (ref 36.0–46.0)
Hemoglobin: 10.9 g/dL — ABNORMAL LOW (ref 12.0–15.0)
MCH: 32.2 pg (ref 26.0–34.0)
MCHC: 33.1 g/dL (ref 30.0–36.0)
MCV: 97.1 fL (ref 80.0–100.0)
Platelets: 117 10*3/uL — ABNORMAL LOW (ref 150–400)
RBC: 3.39 MIL/uL — ABNORMAL LOW (ref 3.87–5.11)
RDW: 15.7 % — ABNORMAL HIGH (ref 11.5–15.5)
WBC: 7.3 10*3/uL (ref 4.0–10.5)
nRBC: 0 % (ref 0.0–0.2)

## 2021-08-09 LAB — IRON AND TIBC
Iron: 82 ug/dL (ref 28–170)
Saturation Ratios: 26 % (ref 10.4–31.8)
TIBC: 313 ug/dL (ref 250–450)
UIBC: 231 ug/dL

## 2021-08-09 LAB — PROTIME-INR
INR: 1.2 (ref 0.8–1.2)
Prothrombin Time: 15 seconds (ref 11.4–15.2)

## 2021-08-09 LAB — FERRITIN: Ferritin: 114 ng/mL (ref 11–307)

## 2021-08-09 SURGERY — UPPER ENDOSCOPIC ULTRASOUND (EUS) RADIAL
Anesthesia: Monitor Anesthesia Care

## 2021-08-09 MED ORDER — SODIUM CHLORIDE 0.9 % IV SOLN: INTRAVENOUS | Status: DC

## 2021-08-09 MED ORDER — SUCRALFATE 1 GM/10ML PO SUSP: 1.0000 g | Freq: Two times a day (BID) | ORAL | 1 refills | Status: DC

## 2021-08-09 MED ORDER — PANTOPRAZOLE SODIUM 40 MG PO TBEC: 40.0000 mg | DELAYED_RELEASE_TABLET | Freq: Two times a day (BID) | ORAL | 6 refills | Status: DC

## 2021-08-09 MED ORDER — MIDAZOLAM HCL 2 MG/2ML IJ SOLN
INTRAMUSCULAR | Status: AC
  Filled 2021-08-09: qty 2

## 2021-08-09 MED ORDER — CIPROFLOXACIN IN D5W 400 MG/200ML IV SOLN
INTRAVENOUS | Status: DC | PRN
  Administered 2021-08-09: 400 mg via INTRAVENOUS

## 2021-08-09 MED ORDER — CIPROFLOXACIN IN D5W 400 MG/200ML IV SOLN
INTRAVENOUS | Status: AC
  Filled 2021-08-09: qty 200

## 2021-08-09 MED ORDER — MIDAZOLAM HCL 5 MG/5ML IJ SOLN
INTRAMUSCULAR | Status: DC | PRN
  Administered 2021-08-09: 2 mg via INTRAVENOUS

## 2021-08-09 MED ORDER — LIDOCAINE HCL (CARDIAC) PF 100 MG/5ML IV SOSY
PREFILLED_SYRINGE | INTRAVENOUS | Status: DC | PRN
  Administered 2021-08-09: 100 mg via INTRAVENOUS

## 2021-08-09 MED ORDER — SPOT INK MARKER SYRINGE KIT
PACK | SUBMUCOSAL | Status: DC | PRN
  Administered 2021-08-09: 1 mL via SUBMUCOSAL

## 2021-08-09 MED ORDER — LACTATED RINGERS IV SOLN: INTRAVENOUS | Status: DC

## 2021-08-09 MED ORDER — ONDANSETRON HCL 4 MG/2ML IJ SOLN
INTRAMUSCULAR | Status: DC | PRN
  Administered 2021-08-09: 4 mg via INTRAVENOUS

## 2021-08-09 MED ORDER — PROPOFOL 500 MG/50ML IV EMUL
INTRAVENOUS | Status: DC | PRN
  Administered 2021-08-09: 100 ug/kg/min via INTRAVENOUS

## 2021-08-09 MED ORDER — PROPOFOL 10 MG/ML IV BOLUS
INTRAVENOUS | Status: DC | PRN
  Administered 2021-08-09 (×2): 50 mg via INTRAVENOUS

## 2021-08-09 MED ORDER — SPOT INK MARKER SYRINGE KIT
PACK | SUBMUCOSAL | Status: AC
  Filled 2021-08-09: qty 10

## 2021-08-09 MED ORDER — PROPOFOL 500 MG/50ML IV EMUL
INTRAVENOUS | Status: AC
  Filled 2021-08-09: qty 150

## 2021-08-09 NOTE — Discharge Instructions (Signed)
YOU HAD AN ENDOSCOPIC PROCEDURE TODAY: Refer to the procedure report and other information in the discharge instructions given to you for any specific questions about what was found during the examination. If this information does not answer your questions, please call [place] office at [phone] to clarify.   YOU SHOULD EXPECT: Some feelings of bloating in the abdomen. Passage of more gas than usual. Walking can help get rid of the air that was put into your GI tract during the procedure and reduce the bloating. If you had a lower endoscopy (such as a colonoscopy or flexible sigmoidoscopy) you may notice spotting of blood in your stool or on the toilet paper. Some abdominal soreness may be present for a day or two, also.  DIET: Your first meal following the procedure should be a light meal and then it is ok to progress to your normal diet. A half-sandwich or bowl of soup is an example of a good first meal. Heavy or fried foods are harder to digest and may make you feel nauseous or bloated. Drink plenty of fluids but you should avoid alcoholic beverages for 24 hours. If you had a esophageal dilation, please see attached instructions for diet.    ACTIVITY: Your care partner should take you home directly after the procedure. You should plan to take it easy, moving slowly for the rest of the day. You can resume normal activity the day after the procedure however YOU SHOULD NOT DRIVE, use power tools, machinery or perform tasks that involve climbing or major physical exertion for 24 hours (because of the sedation medicines used during the test).   SYMPTOMS TO REPORT IMMEDIATELY: A gastroenterologist can be reached at any hour. Please call [phone]  for any of the following symptoms:   Following upper endoscopy (EGD, EUS, ERCP, esophageal dilation) Vomiting of blood or coffee ground material  New, significant abdominal pain  New, significant chest pain or pain under the shoulder blades  Painful or  persistently difficult swallowing  New shortness of breath  Black, tarry-looking or red, bloody stools  FOLLOW UP:  If any biopsies were taken you will be contacted by phone or by letter within the next 1-3 weeks. Call [phone]  if you have not heard about the biopsies in 3 weeks.  Please also call with any specific questions about appointments or follow up tests.  

## 2021-08-09 NOTE — Transfer of Care (Signed)
Immediate Anesthesia Transfer of Care Note  Patient: Carla Leonard  Procedure(s) Performed: UPPER ENDOSCOPIC ULTRASOUND (EUS) LINEAR  ESOPHAGOGASTRODUODENOSCOPY (EGD) BIOPSY POLYPECTOMY SUBMUCOSAL TATTOO INJECTION HEMOSTASIS CLIP PLACEMENT HOT HEMOSTASIS (ARGON PLASMA COAGULATION/BICAP)  Patient Location: PACU  Anesthesia Type:MAC  Level of Consciousness: awake, alert  and patient cooperative  Airway & Oxygen Therapy: Patient Spontanous Breathing and Patient connected to face mask oxygen  Post-op Assessment: Report given to RN, Post -op Vital signs reviewed and stable and Patient moving all extremities X 4  Post vital signs: Reviewed and stable  Last Vitals:  Vitals Value Taken Time  BP    Temp    Pulse    Resp    SpO2      Last Pain:  Vitals:   08/09/21 0746  TempSrc: Temporal  PainSc: 0-No pain         Complications: No notable events documented.

## 2021-08-09 NOTE — H&P (Signed)
GASTROENTEROLOGY PROCEDURE H&P NOTE   Primary Care Physician: Darrol Jump, NP  HPI: Carla Leonard is a 66 y.o. female who presents for EGD/EUS to evaluate underlying cirrhosis and risk of portal hypertension (varices screening) as well as evaluate biliary duct dilation (status postcholecystectomy) with some slight abnormal LFTs.  Past Medical History:  Diagnosis Date   Arthritis    Depression    History of kidney stones    Hypertension    Past Surgical History:  Procedure Laterality Date   APPENDECTOMY     CHOLECYSTECTOMY     ELBOW SURGERY     RIGHT   IR PARACENTESIS  04/28/2021   IR PARACENTESIS  05/06/2021   IR PARACENTESIS  05/11/2021   JOINT REPLACEMENT     right TKA 06/2010   KNEE ARTHROPLASTY  06/17/2011   Procedure: COMPUTER ASSISTED TOTAL KNEE ARTHROPLASTY;  Surgeon: Alta Corning, MD;  Location: [place];  Service: Orthopedics;  Laterality: Left;  TOTAL KNEE REPLACEMENT WITH GENERAL ANESTHESIA AND PRE OP FEMORAL NERVE BLOCK   PERIANAL CYST     Current Facility-Administered Medications  Medication Dose Route Frequency Provider Last Rate Last Admin   0.9 %  sodium chloride infusion   Intravenous Continuous Mansouraty, Telford Nab., MD        Current Facility-Administered Medications:    0.9 %  sodium chloride infusion, , Intravenous, Continuous, Mansouraty, Telford Nab., MD Allergies  Allergen Reactions   Sulfa Antibiotics Anaphylaxis   Prednisone Other (See Comments)    hallucinations   Nsaids Other (See Comments)    Stomach pain, diarrhea   Other Other (See Comments)    Steroids: hallucinations   Family History  Problem Relation Age of Onset   Pneumonia Mother    Diabetes Father    Colon cancer Neg Hx    Esophageal cancer Neg Hx    Stomach cancer Neg Hx    Pancreatic cancer Neg Hx    Colon polyps Neg Hx    Social History   Socioeconomic History   Marital status: Legally Separated    Spouse name: Not on file   Number of children: 1   Years of  education: Not on file   Highest education level: Not on file  Occupational History   Occupation: Retired Quarry manager  Tobacco Use   Smoking status: Former    Packs/day: 0.25    Types: Cigarettes   Smokeless tobacco: Not on file   Tobacco comments:    Former 2 ppd  Scientific laboratory technician Use: Every day   Devices: Non Nicotine  Substance and Sexual Activity   Alcohol use: Yes   Drug use: No   Sexual activity: Not on file  Other Topics Concern   Not on file  Social History Narrative   Not on file   Social Determinants of Health   Financial Resource Strain: Not on file  Food Insecurity: Not on file  Transportation Needs: Not on file  Physical Activity: Not on file  Stress: Not on file  Social Connections: Not on file  Intimate Partner Violence: Not on file    Physical Exam: There were no vitals filed for this visit. There is no height or weight on file to calculate BMI. GEN: NAD EYE: Sclerae anicteric ENT: MMM CV: Non-tachycardic GI: Soft, NT/ND NEURO:  Alert & Oriented x 3  Lab Results: No results for input(s): "WBC", "HGB", "HCT", "PLT" in the last 72 hours. BMET No results for input(s): "NA", "K", "CL", "CO2", "GLUCOSE", "BUN", "  CREATININE", "CALCIUM" in the last 72 hours. LFT No results for input(s): "PROT", "ALBUMIN", "AST", "ALT", "ALKPHOS", "BILITOT", "BILIDIR", "IBILI" in the last 72 hours. PT/INR No results for input(s): "LABPROT", "INR" in the last 72 hours.   Impression / Plan: This is a 66 y.o.female who presents for EGD/EUS to evaluate underlying cirrhosis and risk of portal hypertension (varices screening) as well as evaluate biliary duct dilation (status postcholecystectomy) with some slight abnormal LFTs.  The risks of an EUS including intestinal perforation, bleeding, infection, aspiration, and medication effects were discussed as was the possibility it may not give a definitive diagnosis if a biopsy is performed.  When a biopsy of the pancreas is done as  part of the EUS, there is an additional risk of pancreatitis at the rate of about 1-2%.  It was explained that procedure related pancreatitis is typically mild, although it can be severe and even life threatening, which is why we do not perform random pancreatic biopsies and only biopsy a lesion/area we feel is concerning enough to warrant the risk.   The risks and benefits of endoscopic evaluation/treatment were discussed with the patient and/or family; these include but are not limited to the risk of perforation, infection, bleeding, missed lesions, lack of diagnosis, severe illness requiring hospitalization, as well as anesthesia and sedation related illnesses.  The patient's history has been reviewed, patient examined, no change in status, and deemed stable for procedure.  The patient and/or family is agreeable to proceed.    Justice Britain, MD [facility] Office # [phone]

## 2021-08-09 NOTE — Anesthesia Postprocedure Evaluation (Signed)
Anesthesia Post Note  Patient: Carla Leonard  Procedure(s) Performed: UPPER ENDOSCOPIC ULTRASOUND (EUS) LINEAR  ESOPHAGOGASTRODUODENOSCOPY (EGD) BIOPSY POLYPECTOMY SUBMUCOSAL TATTOO INJECTION HEMOSTASIS CLIP PLACEMENT HOT HEMOSTASIS (ARGON PLASMA COAGULATION/BICAP)     Patient location during evaluation: Endoscopy Anesthesia Type: MAC Level of consciousness: awake and alert Pain management: pain level controlled Vital Signs Assessment: post-procedure vital signs reviewed and stable Respiratory status: spontaneous breathing, nonlabored ventilation, respiratory function stable and patient connected to nasal cannula oxygen Cardiovascular status: blood pressure returned to baseline and stable Postop Assessment: no apparent nausea or vomiting Anesthetic complications: no   No notable events documented.  Last Vitals:  Vitals:   08/09/21 0746 08/09/21 0955  BP: 115/67 (!) 122/35  Pulse: 91   Resp: (!) 21 13  Temp: 36.7 C (!) 36.4 C  SpO2:  100%    Last Pain:  Vitals:   08/09/21 0955  TempSrc: Temporal  PainSc: 0-No pain                 Barnet Glasgow

## 2021-08-09 NOTE — Op Note (Signed)
[facility] Patient Name: Carla Leonard Procedure Date: 08/09/2021 MRN: [id number] Attending MD: Justice Britain , MD Date of Birth: [birth year] CSN: [id number] Age: 66 Admit Type: Outpatient Procedure:                Upper EUS Indications:              Common bile duct dilation (acquired) seen on MRCP,                            Elevated liver enzymes, Cirrhosis rule out                            esophageal varices Providers:                Justice Britain, MD, Doristine Johns, RN,                            William Dalton, Technician Referring MD:             Adline Mango" Lorenso Courier Medicines:                Monitored Anesthesia Care, Cipro 374 mg IV Complications:            No immediate complications. Estimated Blood Loss:     Estimated blood loss was minimal. Procedure:                Pre-Anesthesia Assessment:                           - Prior to the procedure, a History and Physical                            was performed, and patient medications and                            allergies were reviewed. The patient's tolerance of                            previous anesthesia was also reviewed. The risks                            and benefits of the procedure and the sedation                            options and risks were discussed with the patient.                            All questions were answered, and informed consent                            was obtained. Prior Anticoagulants: The patient has                            taken no previous anticoagulant or antiplatelet  agents. ASA Grade Assessment: III - A patient with                            severe systemic disease. After reviewing the risks                            and benefits, the patient was deemed in                            satisfactory condition to undergo the procedure.                           After obtaining informed consent, the endoscope was                             passed under direct vision. Throughout the                            procedure, the patient's blood pressure, pulse, and                            oxygen saturations were monitored continuously. The                            [id number]-H190 ([id number]) Olympus endoscope was introduced                            through the mouth, and advanced to the second part                            of duodenum. The Duodenoscope was introduced                            through the mouth, and advanced to the area of                            papilla. The [id number] ([id number]) Olympus linear                            ultrasound scope was introduced through the mouth,                            and advanced to the duodenum for ultrasound                            examination from the stomach and duodenum. The                            upper EUS was accomplished without difficulty. The                            patient tolerated the procedure. Scope In: Scope Out: Findings:      ENDOSCOPIC FINDING: :  No gross lesions were noted in the proximal esophagus and in the mid       esophagus.      Grade I varices were found in the distal esophagus.      The Z-line was regular and was found 35 cm from the incisors.      Severe, diffuse portal hypertensive gastropathy was found in the cardia,       in the gastric fundus and in the gastric body.      Patchy erythematous mucosa without bleeding was found in the entire       examined stomach. Biopsies were taken with a cold forceps for histology       and Helicobacter pylori testing.      Hematin (altered blood/coffee-ground-like material) as well as some       small active oozing was found in the gastric body and in the gastric       antrum. Lavage of the area was performed using a moderate amount,       resulting in clearance with adequate visualization.      Moderate gastric antral vascular ectasia with a few areas of active       oozing  was present in the gastric antrum and in the prepyloric region of       the stomach. Fulguration to ablate the lesion by argon plasma was       successful.      Single 4 mm sessile polyp was found in the duodenal bulb. The polyp was       removed with a cold snare. Resection and retrieval were complete. To       prevent bleeding after the polypectomy, one hemostatic clip was       successfully placed (MR conditional). There was no bleeding at the end       of the procedure.      Patchy moderately erythematous mucosa without active bleeding and with       no stigmata of bleeding was found in the duodenal bulb, in the first       portion of the duodenum and in the second portion of the duodenum.       Biopsies were taken with a cold forceps for histology. This seems likely       portal duodenopathy developing.      A single 11 mm nodule was found in the second portion of the duodenum.       Biopsies were taken with a cold forceps for histology to rule out       adenoma. Area was tattooed with an injection of Spot (carbon black) on       contralateral wall (this is proximal to the major papilla.      Normal mucosa was found at the major papillary region.      ENDOSONOGRAPHIC FINDING: :      Endosonographic imaging of the ampulla showed no extrinsic compression,       intramural (subepithelial) lesion, mass, varices or wall thickening.      There was no sign of significant endosonographic abnormality in the       pancreatic head (PD - 2.2 mm -> 1.6 mm), genu of the pancreas (PD - 2.1       mm), pancreatic body (PD - 1.6 mm) and pancreatic tail (1.0 mm). No       masses, no cysts, no calcifications, the pancreatic duct was regular in       contour.  One stone was visualized endosonographically in the cystic duct. The       stone was round. It was hyperechoic and characterized by shadowing.      There was dilation in the common bile duct (4.6 mm -> 13.1 mm) and in       the common hepatic  duct (18.0 mm). There was no evidence of any stones       or sludge within the CBD however.      There was diffuse abnormal echotexture in the visualized portion of the       liver. This was characterized by a heterogenous appearance. No masses or       lesions were appreciated.      No malignant-appearing lymph nodes were visualized in the celiac region       (level 20), peripancreatic region and porta hepatis region.      The celiac region was visualized. Impression:               EGD Impression:                           - No gross lesions in esophagus proximally. Grade I                            esophageal varices distally.                           - Z-line regular, 35 cm from the incisors.                           - Diffuse portal hypertensive gastropathy.                           - Erythematous mucosa in the stomach. Biopsied.                           - Hematin (altered blood/coffee-ground-like                            material) and small oozing noted in the gastric                            antrum and in the gastric body - lavaged away                            showing GAVE.                           - Gastric antral vascular ectasia with areas of                            active oozing noted. APC performed to actively                            oozing areas.                           - Dodenal polyp. Resected and retrieved. Clip (MR  conditional) was placed.                           - Erythematous duodenopathy (Portal Duodenopathy?).                            Biopsied.                           - Nodule found in the duodenum. Biopsied. Tattooed                            contralateral wall.                           - Normal mucosa was found in the major papilla.                           EUS Impression:                           - There was no sign of significant pathology in the                            pancreatic head, genu of the  pancreas, pancreatic                            body and pancreatic tail.                           - One stone was visualized endosonographically in                            the cystic duct.                           - There was dilation in the common bile duct and in                            the common hepatic duct. but no clear stones at                            this time.                           - There was diffuse abnormal echotexture in the                            visualized portion of the liver. This was                            characterized by a heterogenous appearance.                           - No malignant-appearing lymph nodes were  visualized in the celiac region (level 20),                            peripancreatic region and porta hepatis region. Moderate Sedation:      Not Applicable - Patient had care per Anesthesia. Recommendation:           - The patient will be observed post-procedure,                            until all discharge criteria are met.                           - Discharge patient to home.                           - Patient has a contact number available for                            emergencies. The signs and symptoms of potential                            delayed complications were discussed with the                            patient. Return to normal activities tomorrow.                            Written discharge instructions were provided to the                            patient.                           - Resume previous diet.                           - Follow up pathology.                           - CBC, CMP, INR, Iron/TIBC, Ferritin to be drawn                            today.                           - Increase PPI to 40 BID x 64-month then back to                            once daily.                           - Carafate twice daily for 1 month.                           - Consider NSBB or  Carvedilol in future for primary  prophylaxis of variceal bleeding in future.                           - Monitor Hgb/Hct, as she may benefit from repeat                            APC or RFA of the more significant amount of GAVE                            that is present if issues of anemia develop in                            future.                           - Patient could be at risk of cystic duct stone                            drop in the future, hopefully would not/will not                            occur at this point, so may years out. Would not                            perform empiric ERCP for sphincterotomy at this                            time.                           - The findings and recommendations were discussed                            with the patient.                           - The findings and recommendations were discussed                            with the designated responsible adult. Procedure Code(s):        --- Professional ---                           [phone], Esophagogastroduodenoscopy, flexible,                            transoral; with removal of tumor(s), polyp(s), or                            other lesion(s) by snare technique                           [id number], Esophagogastroduodenoscopy, flexible,  transoral; with endoscopic ultrasound examination                            limited to the esophagus, stomach or duodenum, and                            adjacent structures Diagnosis Code(s):        --- Professional ---                           K74.60, Unspecified cirrhosis of liver                           I85.10, Secondary esophageal varices without                            bleeding                           K76.6, Portal hypertension                           K31.89, Other diseases of stomach and duodenum                           K92.2, Gastrointestinal hemorrhage, unspecified                            K31.811, Angiodysplasia of stomach and duodenum                            with bleeding                           K31.7, Polyp of stomach and duodenum                           K80.20, Calculus of gallbladder without                            cholecystitis without obstruction                           K83.8, Other specified diseases of biliary tract                           I89.9, Noninfective disorder of lymphatic vessels                            and lymph nodes, unspecified                           R74.8, Abnormal levels of other serum enzymes                           R93.2, Abnormal findings on diagnostic imaging of  liver and biliary tract CPT copyright 2019 [employer]. All rights reserved. The codes documented in this report are preliminary and upon coder review may  be revised to meet current compliance requirements. Justice Britain, MD 08/09/2021 10:17:23 AM Number of Addenda: 0

## 2021-08-10 ENCOUNTER — Encounter (HOSPITAL_COMMUNITY): Payer: Self-pay | Admitting: Gastroenterology

## 2021-08-10 LAB — SURGICAL PATHOLOGY

## 2021-08-11 ENCOUNTER — Telehealth: Payer: Self-pay | Admitting: Internal Medicine

## 2021-08-11 ENCOUNTER — Encounter: Payer: Self-pay | Admitting: Gastroenterology

## 2021-08-11 ENCOUNTER — Other Ambulatory Visit: Payer: Self-pay

## 2021-08-11 DIAGNOSIS — K7031 Alcoholic cirrhosis of liver with ascites: Secondary | ICD-10-CM

## 2021-08-11 NOTE — Telephone Encounter (Signed)
Carla Leonard, home health nurse.is calling about lab order from May. Advised her client had been in for lab work both May and June but says that's not the information she needs. Please reach out to advise. [phone]

## 2021-08-12 NOTE — Telephone Encounter (Signed)
Form received and faxed as requested. Form sent to Medical Records to be scanned into patient's records.

## 2021-08-12 NOTE — Telephone Encounter (Signed)
Spoke with [employer]. She needs an order signed and has faxed the order multiple times to [phone] starting in May. I have given her a different fax number and asked her to add my name to the faxed paper.  Once received, it will need to be signed by or stamped signature of Dr Lorenso Courier and returned via fax to [phone].

## 2021-08-17 ENCOUNTER — Other Ambulatory Visit: Payer: Self-pay

## 2021-08-17 NOTE — Progress Notes (Signed)
Opened in error

## 2021-08-27 ENCOUNTER — Emergency Department (HOSPITAL_COMMUNITY)
Admission: EM | Admit: 2021-08-27 | Discharge: 2021-08-27 | Disposition: A | Discharge status: Home / Self Care | Payer: Medicare HMO | Attending: Emergency Medicine | Admitting: Emergency Medicine

## 2021-08-27 ENCOUNTER — Encounter (HOSPITAL_COMMUNITY): Payer: Self-pay

## 2021-08-27 ENCOUNTER — Emergency Department (HOSPITAL_COMMUNITY): Payer: Medicare HMO

## 2021-08-27 DIAGNOSIS — R410 Disorientation, unspecified: Secondary | ICD-10-CM | POA: Diagnosis not present

## 2021-08-27 DIAGNOSIS — I129 Hypertensive chronic kidney disease with stage 1 through stage 4 chronic kidney disease, or unspecified chronic kidney disease: Secondary | ICD-10-CM | POA: Diagnosis not present

## 2021-08-27 DIAGNOSIS — R4182 Altered mental status, unspecified: Secondary | ICD-10-CM | POA: Diagnosis present

## 2021-08-27 DIAGNOSIS — N183 Chronic kidney disease, stage 3 unspecified: Secondary | ICD-10-CM | POA: Diagnosis not present

## 2021-08-27 LAB — CBC WITH DIFFERENTIAL/PLATELET
Abs Immature Granulocytes: 0.02 10*3/uL (ref 0.00–0.07)
Basophils Absolute: 0 10*3/uL (ref 0.0–0.1)
Basophils Relative: 1 %
Eosinophils Absolute: 0.2 10*3/uL (ref 0.0–0.5)
Eosinophils Relative: 4 %
HCT: 33.5 % — ABNORMAL LOW (ref 36.0–46.0)
Hemoglobin: 10.7 g/dL — ABNORMAL LOW (ref 12.0–15.0)
Immature Granulocytes: 0 %
Lymphocytes Relative: 17 %
Lymphs Abs: 0.9 10*3/uL (ref 0.7–4.0)
MCH: 31 pg (ref 26.0–34.0)
MCHC: 31.9 g/dL (ref 30.0–36.0)
MCV: 97.1 fL (ref 80.0–100.0)
Monocytes Absolute: 0.6 10*3/uL (ref 0.1–1.0)
Monocytes Relative: 12 %
Neutro Abs: 3.3 10*3/uL (ref 1.7–7.7)
Neutrophils Relative %: 66 %
Platelets: 128 10*3/uL — ABNORMAL LOW (ref 150–400)
RBC: 3.45 MIL/uL — ABNORMAL LOW (ref 3.87–5.11)
RDW: 15.8 % — ABNORMAL HIGH (ref 11.5–15.5)
WBC: 5 10*3/uL (ref 4.0–10.5)
nRBC: 0 % (ref 0.0–0.2)

## 2021-08-27 LAB — RAPID URINE DRUG SCREEN, HOSP PERFORMED
Amphetamines: NOT DETECTED
Barbiturates: POSITIVE — AB
Benzodiazepines: NOT DETECTED
Cocaine: NOT DETECTED
Opiates: NOT DETECTED
Tetrahydrocannabinol: POSITIVE — AB

## 2021-08-27 LAB — COMPREHENSIVE METABOLIC PANEL
ALT: 25 U/L (ref 0–44)
AST: 37 U/L (ref 15–41)
Albumin: 3 g/dL — ABNORMAL LOW (ref 3.5–5.0)
Alkaline Phosphatase: 125 U/L (ref 38–126)
Anion gap: 8 (ref 5–15)
BUN: 19 mg/dL (ref 8–23)
CO2: 24 mmol/L (ref 22–32)
Calcium: 8.8 mg/dL — ABNORMAL LOW (ref 8.9–10.3)
Chloride: 105 mmol/L (ref 98–111)
Creatinine, Ser: 1.62 mg/dL — ABNORMAL HIGH (ref 0.44–1.00)
GFR, Estimated: 35 mL/min — ABNORMAL LOW (ref >60)
Glucose, Bld: 96 mg/dL (ref 70–99)
Potassium: 3.8 mmol/L (ref 3.5–5.1)
Sodium: 137 mmol/L (ref 135–145)
Total Bilirubin: 0.7 mg/dL (ref 0.3–1.2)
Total Protein: 6.5 g/dL (ref 6.5–8.1)

## 2021-08-27 LAB — URINALYSIS, ROUTINE W REFLEX MICROSCOPIC
Bacteria, UA: NONE SEEN
Bilirubin Urine: NEGATIVE
Glucose, UA: NEGATIVE mg/dL
Hgb urine dipstick: NEGATIVE
Ketones, ur: NEGATIVE mg/dL
Nitrite: NEGATIVE
Protein, ur: NEGATIVE mg/dL
Specific Gravity, Urine: 1.006 (ref 1.005–1.030)
pH: 6 (ref 5.0–8.0)

## 2021-08-27 LAB — I-STAT VENOUS BLOOD GAS, ED
Acid-Base Excess: 0 mmol/L (ref 0.0–2.0)
Bicarbonate: 23.8 mmol/L (ref 20.0–28.0)
Calcium, Ion: 1.11 mmol/L — ABNORMAL LOW (ref 1.15–1.40)
HCT: 27 % — ABNORMAL LOW (ref 36.0–46.0)
Hemoglobin: 9.2 g/dL — ABNORMAL LOW (ref 12.0–15.0)
O2 Saturation: 62 %
Potassium: 3.7 mmol/L (ref 3.5–5.1)
Sodium: 136 mmol/L (ref 135–145)
TCO2: 25 mmol/L (ref 22–32)
pCO2, Ven: 35.8 mmHg — ABNORMAL LOW (ref 44–60)
pH, Ven: 7.429 (ref 7.25–7.43)
pO2, Ven: 31 mmHg — CL (ref 32–45)

## 2021-08-27 LAB — ETHANOL: Alcohol, Ethyl (B): <10 mg/dL (ref <10)

## 2021-08-27 LAB — PROTIME-INR
INR: 1.2 (ref 0.8–1.2)
Prothrombin Time: 15.4 seconds — ABNORMAL HIGH (ref 11.4–15.2)

## 2021-08-27 LAB — AMMONIA: Ammonia: 19 umol/L (ref 9–35)

## 2021-08-27 LAB — LACTIC ACID, PLASMA: Lactic Acid, Venous: 1.3 mmol/L (ref 0.5–1.9)

## 2021-08-27 LAB — CBG MONITORING, ED: Glucose-Capillary: 83 mg/dL (ref 70–99)

## 2021-08-27 MED ORDER — SODIUM CHLORIDE 0.9 % IV SOLN: INTRAVENOUS | Status: DC

## 2021-08-27 NOTE — ED Triage Notes (Signed)
Pt BIB EMS from home d/t PCP notifying her that her ammonia levels are high. She called EMS tonight d/t increased memory problems and weakness. Pt on list waiting for a liver transplant. Pt AO4, VSS

## 2021-08-27 NOTE — ED Notes (Signed)
Patient transported to CT 

## 2021-08-27 NOTE — ED Provider Notes (Addendum)
[facility] EMERGENCY DEPARTMENT Provider Note  CSN: [id number] Arrival date & time: 08/27/21 0124  Chief Complaint(s) Abnormal Lab ED Triage Notes Dechambeau, Jake Church, RN (Registered Nurse)   Emergency Medicine   Date of Service: 08/27/2021  1:35 AM   Signed   Pt BIB EMS from home d/t PCP notifying her that her ammonia levels are high. She called EMS tonight d/t increased memory problems and weakness. Pt on list waiting for a liver transplant. Pt AO4, VSS     HPI Carla Leonard is a 66 y.o. female with a past medical history listed below including alcoholic cirrhosis who is being evaluated for liver transplant here for altered mental status and memory issues.  Per report family was called tonight stating that the ammonia level was high.  However on review of systems from labs drawn on 821 & 822, on care everywhere, ammonia level was not listed.  Patient currently denies any physical complaints or pain.  She does intermittently stop speaking during interview.  The history is provided by the EMS personnel.    Past Medical History Past Medical History:  Diagnosis Date   Arthritis    Depression    History of kidney stones    Hypertension    Patient Active Problem List   Diagnosis Date Noted   Physical deconditioning 05/09/2021   Goals of care, counseling/discussion 05/09/2021   Spontaneous bacterial peritonitis ([place]) 05/08/2021   Acute blood loss anemia 05/08/2021   Obesity, Class III, BMI 40-49.9 (morbid obesity) ([place]) 05/08/2021   Severe sepsis due to SBP    History of DVT (deep vein thrombosis) 05/06/2021   Abnormal findings on diagnostic imaging of liver and biliary tract    Volume overload 37/90/2409   Alcoholic cirrhosis of liver with ascites ([place]) 04/07/2021   Fall at home, initial encounter 02/12/2021   Closed bicondylar fracture of distal femur, left, initial encounter ([place]) 02/11/2021   Adrenal insufficiency ([place]) 02/05/2021   Cellulitis of  abdominal wall 02/03/2021   Heme positive stool 02/03/2021   Ascites 02/03/2021   History of alcohol abuse 02/03/2021   Seizure disorder ([place]) 02/03/2021   Hypotension 02/02/2021   Lactic acidosis 02/02/2021   CKD (chronic kidney disease), stage III ([place]) 02/02/2021   Hyperbilirubinemia 02/02/2021   Hypoalbuminemia 02/02/2021   Thrombocytopenia ([place]) 02/02/2021   Hematoma 06/15/2015   Acute kidney injury superimposed on chronic kidney disease ([place]) 06/15/2015   Anxiety and depression 06/15/2015   Cellulitis of right lower extremity    Hypokalemia    Renal insufficiency    Cellulitis of right leg 06/14/2015   Osteoarthritis of left knee 06/17/2011   Ganglion of right wrist 06/17/2011   Home Medication(s) Prior to Admission medications   Medication Sig Start Date End Date Taking? Authorizing Provider  ALPRAZolam Duanne Moron) 0.5 MG tablet Take 1 tablet by mouth 2 times a day as needed Patient taking differently: Take 0.5 mg by mouth 2 (two) times daily as needed for anxiety or sleep. 03/05/21     budesonide-formoterol (SYMBICORT) 160-4.5 MCG/ACT inhaler Inhale 2 puffs into the lungs 2 (two) times daily as needed (shortness of breath/wheezing). 01/20/21   [provider]  ciprofloxacin (CIPRO) 500 MG tablet Take 1 tablet (500 mg total) by mouth daily. 08/06/21   Sharyn Creamer, MD  folic acid (FOLVITE) 1 MG tablet Take 1 tablet (1 mg total) by mouth daily. 05/12/21   Shelly Coss, MD  furosemide (LASIX) 20 MG tablet Take 3 tablets (60 mg total) by  mouth daily. 05/18/21   Sharyn Creamer, MD  hydrocortisone (CORTEF) 10 MG tablet Take 2 tablets (20 mg total) by mouth daily.  Do not stop it abruptly unless told by the endocrinologist. 04/12/21   Thurnell Lose, MD  levETIRAcetam (KEPPRA) 500 MG tablet Take 1 tablet by mouth 2 times a day Patient taking differently: Take 500 mg by mouth 2 (two) times daily. 08/21/20     loperamide (IMODIUM) 2 MG capsule Take 1 capsule (2 mg total) by mouth  as needed for diarrhea or loose stools. 05/11/21   Shelly Coss, MD  midodrine (PROAMATINE) 10 MG tablet Take 1 tablet (10 mg total) by mouth 3 (three) times daily with meals. Patient taking differently: Take 10 mg by mouth 2 (two) times daily. 04/12/21   Thurnell Lose, MD  mometasone-formoterol (DULERA) 200-5 MCG/ACT AERO Inhale 2 puffs into the lungs daily as needed (Congestion).    [provider]  pantoprazole (PROTONIX) 40 MG tablet Take 1 tablet (40 mg total) by mouth 2 (two) times daily before a meal. Twice daily for 70-month then back to once daily. 08/09/21   Mansouraty, GTelford Nab, MD  potassium chloride (KLOR-CON M) 10 MEQ tablet Take 20 mEq by mouth daily. 04/23/21   [provider]  primidone (MYSOLINE) 50 MG tablet Take 100 mg by mouth every morning. 01/21/21   [provider]  promethazine (PHENERGAN) 12.5 MG tablet Take 1 tablet (12.5 mg total) by mouth every 6 (six) hours as needed for nausea or vomiting. 08/06/21   DSharyn Creamer MD  spironolactone (ALDACTONE) 100 MG tablet Take 1 tablet (100 mg total) by mouth daily. 05/18/21   DSharyn Creamer MD  sucralfate (CARAFATE) 1 GM/10ML suspension Take 10 mLs (1 g total) by mouth 2 (two) times daily. 08/09/21 08/09/22  Mansouraty, GTelford Nab, MD  thiamine 100 MG tablet Take 1 tablet (100 mg total) by mouth daily. Patient taking differently: Take 250 mg by mouth daily. 05/12/21   AShelly Coss MD  traMADol (ULTRAM) 50 MG tablet Take 50 mg by mouth every 8 (eight) hours as needed for moderate pain. 03/25/21   [provider]                                                                                                                                    Allergies Sulfa antibiotics, Prednisone, Nsaids, and Other  Review of Systems Review of Systems As noted in HPI  Physical Exam Vital Signs  I have reviewed the triage vital signs BP 121/65 (BP Location: Left Arm)   Pulse 92   Temp 97.8 F (36.6 C)    Resp 17   Ht '5\' 1"'$  (1.549 m)   Wt 104.8 kg   SpO2 100%   BMI 43.65 kg/m   Physical Exam Vitals reviewed.  Constitutional:      General: She is not in acute distress.    Appearance:  She is well-developed. She is obese. She is not diaphoretic.  HENT:     Head: Normocephalic and atraumatic.     Nose: Nose normal.  Eyes:     General: No scleral icterus.       Right eye: No discharge.        Left eye: No discharge.     Conjunctiva/sclera: Conjunctivae normal.     Pupils: Pupils are equal, round, and reactive to light.  Cardiovascular:     Rate and Rhythm: Normal rate and regular rhythm.     Heart sounds: No murmur heard.    No friction rub. No gallop.  Pulmonary:     Effort: Pulmonary effort is normal. No respiratory distress.     Breath sounds: Normal breath sounds. No stridor. No rales.  Abdominal:     General: There is no distension.     Palpations: Abdomen is soft.     Tenderness: There is no abdominal tenderness.  Musculoskeletal:        General: No tenderness.     Cervical back: Normal range of motion and neck supple.  Skin:    General: Skin is warm and dry.     Findings: No erythema or rash.  Neurological:     Motor: Tremors: intermittent tremor.     Comments: A&O x3 but slow to respond. Follows commands intermittently Moves all extremities. Sensation intact     ED Results and Treatments Labs (all labs ordered are listed, but only abnormal results are displayed) Labs Reviewed  COMPREHENSIVE METABOLIC PANEL - Abnormal; Notable for the following components:      Result Value   Creatinine, Ser 1.62 (*)    Calcium 8.8 (*)    Albumin 3.0 (*)    GFR, Estimated 35 (*)    All other components within normal limits  CBC WITH DIFFERENTIAL/PLATELET - Abnormal; Notable for the following components:   RBC 3.45 (*)    Hemoglobin 10.7 (*)    HCT 33.5 (*)    RDW 15.8 (*)    Platelets 128 (*)    All other components within normal limits  URINALYSIS, ROUTINE W REFLEX  MICROSCOPIC - Abnormal; Notable for the following components:   Leukocytes,Ua TRACE (*)    All other components within normal limits  RAPID URINE DRUG SCREEN, HOSP PERFORMED - Abnormal; Notable for the following components:   Tetrahydrocannabinol POSITIVE (*)    Barbiturates POSITIVE (*)    All other components within normal limits  PROTIME-INR - Abnormal; Notable for the following components:   Prothrombin Time 15.4 (*)    All other components within normal limits  I-STAT VENOUS BLOOD GAS, ED - Abnormal; Notable for the following components:   pCO2, Ven 35.8 (*)    pO2, Ven 31 (*)    Calcium, Ion 1.11 (*)    HCT 27.0 (*)    Hemoglobin 9.2 (*)    All other components within normal limits  AMMONIA  LACTIC ACID, PLASMA  ETHANOL  CBG MONITORING, ED  EKG  EKG Interpretation  Date/Time:  Friday August 27 2021 02:19:03 EDT Ventricular Rate:  87 PR Interval:  167 QRS Duration: 90 QT Interval:  375 QTC Calculation: 452 R Axis:   43 Text Interpretation: Sinus rhythm Low voltage, precordial leads Confirmed by Addison Lank [phone]) on 08/27/2021 4:40:33 AM       Radiology CT HEAD WO CONTRAST  Result Date: 08/27/2021 CLINICAL DATA:  Altered mental status EXAM: CT HEAD WITHOUT CONTRAST TECHNIQUE: Contiguous axial images were obtained from the base of the skull through the vertex without intravenous contrast. RADIATION DOSE REDUCTION: This exam was performed according to the departmental dose-optimization program which includes automated exposure control, adjustment of the mA and/or kV according to patient size and/or use of iterative reconstruction technique. COMPARISON:  08/01/2017 FINDINGS: Brain: There is no mass, hemorrhage or extra-axial collection. The size and configuration of the ventricles and extra-axial CSF spaces are normal. The brain parenchyma is normal,  without acute or chronic infarction. Vascular: No abnormal hyperdensity of the major intracranial arteries or dural venous sinuses. No intracranial atherosclerosis. Skull: The visualized skull base, calvarium and extracranial soft tissues are normal. Sinuses/Orbits: No fluid levels or advanced mucosal thickening of the visualized paranasal sinuses. No mastoid or middle ear effusion. The orbits are normal. IMPRESSION: Normal head CT. Electronically Signed   By: Ulyses Jarred M.D.   On: 08/27/2021 02:48    Medications Ordered in ED Medications  0.9 %  sodium chloride infusion (0 mLs Intravenous Stopped 08/27/21 3875)                                                                                                                                     Procedures Procedures  (including critical care time)  Medical Decision Making / ED Course   Medical Decision Making Amount and/or Complexity of Data Reviewed External Data Reviewed: labs.    Details: Noted in HPI Labs: ordered. Radiology: ordered and independent interpretation performed. ECG/medicine tests: ordered and independent interpretation performed. Decision-making details documented in ED Course.  Risk Prescription drug management. Decision regarding hospitalization.  Patient presented with altered mental status.  He Given history, will need to assess for evidence of liver failure and hepatic encephalopathy. We will assess for any metabolic or electrolyte derangements, including hypoglycemia. Will get LFT and INR to assess liver function. We will also assess for infectious process. We will also assess for EtOH or evidence of drug use. We will get CT head to rule out ICH  CT head negative for ICH or mass effect.  CBC without leukocytosis.  Stable hemoglobin.  Metabolic panel without significant electrolyte derangement.  Baseline renal function.  LFTs actually improved from most recent in 822.  Ammonia level normal.  Lactic acid  normal.  UA without evidence of infection.  Blood gas without evidence of hypercarbia.  EtOH normal.  UDS is positive for barbiturates which is likely from her primidone.  Also  positive for [facility] which patient reports only using 2 weeks ago.  After several hours of monitoring, patient was more alert and cooperative.  She was up and about without any ataxia or instability.     Final Clinical Impression(s) / ED Diagnoses Final diagnoses:  Episode of confusion   The patient appears reasonably screened and/or stabilized for discharge and I doubt any other medical condition or other [facility] requiring further screening, evaluation, or treatment in the ED at this time. I have discussed the findings, Dx and Tx plan with the patient/family who expressed understanding and agree(s) with the plan. Discharge instructions discussed at length. The patient/family was given strict return precautions who verbalized understanding of the instructions. No further questions at time of discharge.  Disposition: Discharge  Condition: Good  ED Discharge Orders     None         Follow Up: Darrol Jump, NP [place] [id number] [phone]  Call  to schedule an appointment for close follow up           This chart was dictated using voice recognition software.  Despite best efforts to proofread,  errors can occur which can change the documentation meaning.    Fatima Blank, MD 08/27/21 2440    Fatima Blank, MD 09/18/21 [phone]

## 2021-08-28 ENCOUNTER — Encounter (HOSPITAL_COMMUNITY): Payer: Self-pay

## 2021-08-31 ENCOUNTER — Emergency Department (HOSPITAL_COMMUNITY): Payer: Medicare HMO

## 2021-08-31 ENCOUNTER — Other Ambulatory Visit: Payer: Self-pay

## 2021-08-31 ENCOUNTER — Encounter (HOSPITAL_COMMUNITY): Payer: Self-pay | Admitting: Emergency Medicine

## 2021-08-31 ENCOUNTER — Emergency Department (HOSPITAL_COMMUNITY)
Admission: EM | Admit: 2021-08-31 | Discharge: 2021-09-01 | Disposition: A | Discharge status: Home / Self Care | Payer: Medicare HMO | Attending: Emergency Medicine | Admitting: Emergency Medicine

## 2021-08-31 DIAGNOSIS — R479 Unspecified speech disturbances: Secondary | ICD-10-CM | POA: Insufficient documentation

## 2021-08-31 DIAGNOSIS — R791 Abnormal coagulation profile: Secondary | ICD-10-CM | POA: Insufficient documentation

## 2021-08-31 DIAGNOSIS — Z96651 Presence of right artificial knee joint: Secondary | ICD-10-CM | POA: Diagnosis not present

## 2021-08-31 DIAGNOSIS — Z87891 Personal history of nicotine dependence: Secondary | ICD-10-CM | POA: Insufficient documentation

## 2021-08-31 DIAGNOSIS — I10 Essential (primary) hypertension: Secondary | ICD-10-CM | POA: Diagnosis not present

## 2021-08-31 DIAGNOSIS — R569 Unspecified convulsions: Secondary | ICD-10-CM | POA: Insufficient documentation

## 2021-08-31 LAB — CBC
HCT: 29.6 % — ABNORMAL LOW (ref 36.0–46.0)
Hemoglobin: 9.4 g/dL — ABNORMAL LOW (ref 12.0–15.0)
MCH: 30.4 pg (ref 26.0–34.0)
MCHC: 31.8 g/dL (ref 30.0–36.0)
MCV: 95.8 fL (ref 80.0–100.0)
Platelets: 152 10*3/uL (ref 150–400)
RBC: 3.09 MIL/uL — ABNORMAL LOW (ref 3.87–5.11)
RDW: 15.8 % — ABNORMAL HIGH (ref 11.5–15.5)
WBC: 6.4 10*3/uL (ref 4.0–10.5)
nRBC: 0 % (ref 0.0–0.2)

## 2021-08-31 LAB — DIFFERENTIAL
Abs Immature Granulocytes: 0.02 10*3/uL (ref 0.00–0.07)
Basophils Absolute: 0 10*3/uL (ref 0.0–0.1)
Basophils Relative: 1 %
Eosinophils Absolute: 0.1 10*3/uL (ref 0.0–0.5)
Eosinophils Relative: 2 %
Immature Granulocytes: 0 %
Lymphocytes Relative: 17 %
Lymphs Abs: 1.1 10*3/uL (ref 0.7–4.0)
Monocytes Absolute: 0.6 10*3/uL (ref 0.1–1.0)
Monocytes Relative: 10 %
Neutro Abs: 4.5 10*3/uL (ref 1.7–7.7)
Neutrophils Relative %: 70 %

## 2021-08-31 LAB — CBG MONITORING, ED: Glucose-Capillary: 85 mg/dL (ref 70–99)

## 2021-08-31 LAB — I-STAT CHEM 8, ED
BUN: 21 mg/dL (ref 8–23)
Calcium, Ion: 1.1 mmol/L — ABNORMAL LOW (ref 1.15–1.40)
Chloride: 101 mmol/L (ref 98–111)
Creatinine, Ser: 1.8 mg/dL — ABNORMAL HIGH (ref 0.44–1.00)
Glucose, Bld: 84 mg/dL (ref 70–99)
HCT: 29 % — ABNORMAL LOW (ref 36.0–46.0)
Hemoglobin: 9.9 g/dL — ABNORMAL LOW (ref 12.0–15.0)
Potassium: 3.9 mmol/L (ref 3.5–5.1)
Sodium: 137 mmol/L (ref 135–145)
TCO2: 24 mmol/L (ref 22–32)

## 2021-08-31 LAB — COMPREHENSIVE METABOLIC PANEL
ALT: 29 U/L (ref 0–44)
AST: 44 U/L — ABNORMAL HIGH (ref 15–41)
Albumin: 3.1 g/dL — ABNORMAL LOW (ref 3.5–5.0)
Alkaline Phosphatase: 133 U/L — ABNORMAL HIGH (ref 38–126)
Anion gap: 10 (ref 5–15)
BUN: 20 mg/dL (ref 8–23)
CO2: 25 mmol/L (ref 22–32)
Calcium: 9 mg/dL (ref 8.9–10.3)
Chloride: 102 mmol/L (ref 98–111)
Creatinine, Ser: 1.75 mg/dL — ABNORMAL HIGH (ref 0.44–1.00)
GFR, Estimated: 32 mL/min — ABNORMAL LOW (ref >60)
Glucose, Bld: 88 mg/dL (ref 70–99)
Potassium: 3.9 mmol/L (ref 3.5–5.1)
Sodium: 137 mmol/L (ref 135–145)
Total Bilirubin: 1 mg/dL (ref 0.3–1.2)
Total Protein: 6.8 g/dL (ref 6.5–8.1)

## 2021-08-31 LAB — APTT: aPTT: 42 seconds — ABNORMAL HIGH (ref 24–36)

## 2021-08-31 LAB — PROTIME-INR
INR: 1.3 — ABNORMAL HIGH (ref 0.8–1.2)
Prothrombin Time: 15.7 seconds — ABNORMAL HIGH (ref 11.4–15.2)

## 2021-08-31 LAB — ETHANOL: Alcohol, Ethyl (B): <10 mg/dL (ref <10)

## 2021-08-31 NOTE — ED Provider Triage Note (Signed)
Emergency Medicine Provider Triage Evaluation Note  Carla Leonard , a 66 y.o. female  was evaluated in triage.  Pt complains of seizures?  Patient reports that she has a history of seizures and has a home Program[employer], applications.  Nurse came by and said that she noted some "seizure-like activity."  Patient was subsequently told to come to the emergency department because she was having seizures and needed to see a neurologist.  Daughter reports she is having difficulty getting her words out and has been tremoring.  Review of Systems  Positive: Seizures?  Dysarthria and tremors Negative:   Physical Exam  Ht '5\' 1"'$  (1.549 m)   Wt 104.3 kg   BMI 43.46 kg/m  Gen:   Awake, no distress   Resp:  Normal effort  MSK:   Moves extremities without difficulty  Other:  Patient appears to be having some degree of dysarthria however I do not know her baseline.  Cranial nerves II through XII grossly intact.  No problem with finger-nose testing.  EOMs intact.  Does not meet criteria for code stroke however MRI would be of benefit.  Medical Decision Making  Medically screening exam initiated at 7:36 PM.  Appropriate orders placed.  Carla Leonard was informed that the remainder of the evaluation will be completed by another provider, this initial triage assessment does not replace that evaluation, and the importance of remaining in the ED until their evaluation is complete.  Negative head CT 4 days ago.  Symptoms have been going on for a week.  Will order MRI of her brain and labs.   Rhae Hammock, [place] 08/31/21 1938

## 2021-08-31 NOTE — ED Triage Notes (Signed)
Pt reports her home health RN told her she is having seizure activity. Pt is having trouble "talking" X1 week.  Pt was just d/c from [facility] .

## 2021-09-01 LAB — AMMONIA: Ammonia: 30 umol/L (ref 9–35)

## 2021-09-01 NOTE — ED Provider Notes (Signed)
[facility] Emergency Department Provider Note MRN:  [id number]  Arrival date & time: 09/01/21     Chief Complaint   Seizures   History of Present Illness   Carla Leonard is a 66 y.o. year-old female with a history of hypertension, cirrhosis presenting to the ED with chief complaint of seizures.  For the past week patient has been having trouble with speech as well as what she describes as seizures.  Random uncontrollable movements of the arms and legs.  She is conscious during these events.  Her greatest complaint is that she is having trouble with speech.  Sometimes has trouble finding the words, sometimes mispronounces words.  This is very frustrating for her.  Review of Systems  A thorough review of systems was obtained and all systems are negative except as noted in the HPI and PMH.   Patient's Health History    Past Medical History:  Diagnosis Date   Arthritis    Depression    History of kidney stones    Hypertension     Past Surgical History:  Procedure Laterality Date   APPENDECTOMY     BIOPSY  08/09/2021   Procedure: BIOPSY;  Surgeon: Rush Landmark, Telford Nab., MD;  Location: [facility];  Service: Gastroenterology;;   CHOLECYSTECTOMY     ELBOW SURGERY     RIGHT   ESOPHAGOGASTRODUODENOSCOPY N/A 08/09/2021   Procedure: ESOPHAGOGASTRODUODENOSCOPY (EGD);  Surgeon: Irving Copas., MD;  Location: [facility];  Service: Gastroenterology;  Laterality: N/A;   EUS N/A 08/09/2021   Procedure: UPPER ENDOSCOPIC ULTRASOUND (EUS) LINEAR ;  Surgeon: Irving Copas., MD;  Location: [facility];  Service: Gastroenterology;  Laterality: N/A;   HEMOSTASIS CLIP PLACEMENT  08/09/2021   Procedure: HEMOSTASIS CLIP PLACEMENT;  Surgeon: Irving Copas., MD;  Location: [facility];  Service: Gastroenterology;;   HOT HEMOSTASIS N/A 08/09/2021   Procedure: HOT HEMOSTASIS (ARGON PLASMA COAGULATION/BICAP);  Surgeon: Irving Copas., MD;   Location: [facility];  Service: Gastroenterology;  Laterality: N/A;   IR PARACENTESIS  04/28/2021   IR PARACENTESIS  05/06/2021   IR PARACENTESIS  05/11/2021   JOINT REPLACEMENT     right TKA 06/2010   KNEE ARTHROPLASTY  06/17/2011   Procedure: COMPUTER ASSISTED TOTAL KNEE ARTHROPLASTY;  Surgeon: Alta Corning, MD;  Location: [facility];  Service: Orthopedics;  Laterality: Left;  TOTAL KNEE REPLACEMENT WITH GENERAL ANESTHESIA AND PRE OP FEMORAL NERVE BLOCK   PERIANAL CYST     POLYPECTOMY  08/09/2021   Procedure: POLYPECTOMY;  Surgeon: Mansouraty, Telford Nab., MD;  Location: [facility];  Service: Gastroenterology;;   SUBMUCOSAL TATTOO INJECTION  08/09/2021   Procedure: SUBMUCOSAL TATTOO INJECTION;  Surgeon: Irving Copas., MD;  Location: [facility];  Service: Gastroenterology;;    Family History  Problem Relation Age of Onset   Pneumonia Mother    Diabetes Father    Colon cancer Neg Hx    Esophageal cancer Neg Hx    Stomach cancer Neg Hx    Pancreatic cancer Neg Hx    Colon polyps Neg Hx     Social History   Socioeconomic History   Marital status: Legally Separated    Spouse name: Not on file   Number of children: 1   Years of education: Not on file   Highest education level: Not on file  Occupational History   Occupation: Retired CNA  Tobacco Use   Smoking status: Former    Packs/day: 0.25    Types: Cigarettes  Smokeless tobacco: Not on file   Tobacco comments:    Former 2 ppd  Vaping Use   Vaping Use: Every day   Devices: Non Nicotine  Substance and Sexual Activity   Alcohol use: Yes   Drug use: No   Sexual activity: Not on file  Other Topics Concern   Not on file  Social History Narrative   Not on file   Social Determinants of Health   Financial Resource Strain: Not on file  Food Insecurity: Not on file  Transportation Needs: Not on file  Physical Activity: Not on file  Stress: Not on file  Social Connections: Not on file  Intimate Partner Violence: Not  on file     Physical Exam   Vitals:   09/01/21 0015 09/01/21 0232  BP: 137/74 119/69  Pulse: 80 83  Resp: 16 18  Temp: 97.9 F (36.6 C) 98.8 F (37.1 C)  SpO2: 100% 100%    CONSTITUTIONAL: Chronically ill-appearing, NAD NEURO/PSYCH:  Alert and oriented x 3, normal and symmetric strength and sensation, normal coordination, occasional slurred speech, occasional mispronunciation of words, occasional use of wrong word when naming objects. EYES:  eyes equal and reactive ENT/NECK:  no LAD, no JVD CARDIO: Regular rate, well-perfused, normal S1 and S2 PULM:  CTAB no wheezing or rhonchi GI/GU:  non-distended, non-tender MSK/SPINE:  No gross deformities, no edema SKIN:  no rash, atraumatic   *Additional and/or pertinent findings included in MDM below  Diagnostic and Interventional Summary    EKG Interpretation  Date/Time:  Tuesday August 31 2021 19:46:17 EDT Ventricular Rate:  82 PR Interval:  150 QRS Duration: 84 QT Interval:  364 QTC Calculation: 425 R Axis:   18 Text Interpretation: Normal sinus rhythm Low voltage QRS Cannot rule out Anterior infarct , age undetermined Abnormal ECG When compared with ECG of 27-Aug-2021 02:19, PREVIOUS ECG IS PRESENT Confirmed by Gerlene Fee [phone]) on 09/01/2021 12:21:44 AM       Labs Reviewed  PROTIME-INR - Abnormal; Notable for the following components:      Result Value   Prothrombin Time 15.7 (*)    INR 1.3 (*)    All other components within normal limits  APTT - Abnormal; Notable for the following components:   aPTT 42 (*)    All other components within normal limits  CBC - Abnormal; Notable for the following components:   RBC 3.09 (*)    Hemoglobin 9.4 (*)    HCT 29.6 (*)    RDW 15.8 (*)    All other components within normal limits  COMPREHENSIVE METABOLIC PANEL - Abnormal; Notable for the following components:   Creatinine, Ser 1.75 (*)    Albumin 3.1 (*)    AST 44 (*)    Alkaline Phosphatase 133 (*)    GFR, Estimated  32 (*)    All other components within normal limits  I-STAT CHEM 8, ED - Abnormal; Notable for the following components:   Creatinine, Ser 1.80 (*)    Calcium, Ion 1.10 (*)    Hemoglobin 9.9 (*)    HCT 29.0 (*)    All other components within normal limits  DIFFERENTIAL  ETHANOL  AMMONIA  CBG MONITORING, ED    MR BRAIN WO CONTRAST  Final Result      Medications - No data to display   Procedures  /  Critical Care Procedures  ED Course and Medical Decision Making  Initial Impression and Ddx Involuntary movements as well as speech disturbance for the  past week.  MRI obtained in triage is without acute stroke, there is some nonspecific moderate findings to suggest either ischemic disease or MS.  Will consult neurology for recommendations.  Past medical/surgical history that increases complexity of ED encounter: Liver cirrhosis  Interpretation of Diagnostics I personally reviewed the EKG and my interpretation is as follows: Sinus rhythm  Labs overall reassuring with no significant blood count or electrolyte disturbance.  Creatinine at or near recent baseline.  Patient Reassessment and Ultimate Disposition/Management     On reassessment patient explains that her speech disturbance is resolved, talking normally at this time.  Given this and her reassuring work-up, MRI, suspect patient is appropriate for discharge.  Consulted Dr. Lorrin Goodell with neurology, who agrees patient can follow-up as an outpatient.  Patient management required discussion with the following services or consulting groups:  Neurology  Complexity of Problems Addressed Acute illness or injury that poses threat of life of bodily function  Additional Data Reviewed and Analyzed Further history obtained from: Prior labs/imaging results  Additional Factors Impacting ED Encounter Risk None  Barth Kirks. Sedonia Small, MD [place] [email or web address]  Final Clinical  Impressions(s) / ED Diagnoses     ICD-10-CM   1. Speech disturbance, unspecified type  R47.9       ED Discharge Orders          Ordered    Ambulatory referral to Neurology       Comments: An appointment is requested in approximately: 2 weeks   09/01/21 0309             Discharge Instructions Discussed with and Provided to Patient:     Discharge Instructions      You were evaluated in the Emergency Department and after careful evaluation, we did not find any emergent condition requiring admission or further testing in the hospital.  Your exam/testing today is overall reassuring.  Recommend follow-up with the neurology doctors as an outpatient.  Also recommend following up with your liver doctors.  Please return to the Emergency Department if you experience any worsening of your condition.   Thank you for allowing [place] to be a part of your care.       Maudie Flakes, MD 09/01/21 [phone]

## 2021-09-01 NOTE — ED Notes (Signed)
ED Provider at bedside. 

## 2021-09-01 NOTE — Discharge Instructions (Signed)
You were evaluated in the Emergency Department and after careful evaluation, we did not find any emergent condition requiring admission or further testing in the hospital.  Your exam/testing today is overall reassuring.  Recommend follow-up with the neurology doctors as an outpatient.  Also recommend following up with your liver doctors.  Please return to the Emergency Department if you experience any worsening of your condition.   Thank you for allowing [place] to be a part of your care.

## 2021-09-02 ENCOUNTER — Ambulatory Visit: Payer: Medicare HMO | Admitting: Internal Medicine

## 2021-09-02 NOTE — Progress Notes (Deleted)
Name: Carla Leonard  MRN/ DOB: [id number], 06/29/1955    Age/ Sex: 66 y.o., female    PCP: Darrol Jump, NP   Reason for Endocrinology Evaluation: Adrenal insufficiency     Date of Initial Endocrinology Evaluation: 09/02/2021     HPI: Carla Leonard is a 66 y.o. female with a past medical history of hepatic cirrhosis, HTN,Hx DVT. The patient presented for initial endocrinology clinic visit on 09/02/2021 for consultative assistance with her ID renal insufficiency.   The patient has been referred here for further evaluation of adrenal insufficiency.  She was noted to have a low serum cortisol on 02/04/2021 at 1.2 UG/DL , the patient had an abnormal cosyntropin stimulation test with a serum cortisol level of 12.8ug/dL at the 60-minute mark  This was done during hospitalization for cellulitis of the abdominal wall, and the patient was noted with hypotension and hypokalemia.  She was discharged on hydrocortisone 10 mg in the morning and 5 mg in the evening  Of note the patient had low serum albumin at the time at 2.0 g /dL   She had an initial consult with liver transplant team on 08/23/2021 She has been evaluated by pulmonology as well as cardiology HISTORY:  Past Medical History:  Past Medical History:  Diagnosis Date   Arthritis    Depression    History of kidney stones    Hypertension    Past Surgical History:  Past Surgical History:  Procedure Laterality Date   APPENDECTOMY     BIOPSY  08/09/2021   Procedure: BIOPSY;  Surgeon: Irving Copas., MD;  Location: [facility];  Service: Gastroenterology;;   CHOLECYSTECTOMY     ELBOW SURGERY     RIGHT   ESOPHAGOGASTRODUODENOSCOPY N/A 08/09/2021   Procedure: ESOPHAGOGASTRODUODENOSCOPY (EGD);  Surgeon: Irving Copas., MD;  Location: [facility];  Service: Gastroenterology;  Laterality: N/A;   EUS N/A 08/09/2021   Procedure: UPPER ENDOSCOPIC ULTRASOUND (EUS) LINEAR ;  Surgeon: Irving Copas., MD;   Location: [facility];  Service: Gastroenterology;  Laterality: N/A;   HEMOSTASIS CLIP PLACEMENT  08/09/2021   Procedure: HEMOSTASIS CLIP PLACEMENT;  Surgeon: Irving Copas., MD;  Location: [facility];  Service: Gastroenterology;;   HOT HEMOSTASIS N/A 08/09/2021   Procedure: HOT HEMOSTASIS (ARGON PLASMA COAGULATION/BICAP);  Surgeon: Irving Copas., MD;  Location: [facility];  Service: Gastroenterology;  Laterality: N/A;   IR PARACENTESIS  04/28/2021   IR PARACENTESIS  05/06/2021   IR PARACENTESIS  05/11/2021   JOINT REPLACEMENT     right TKA 06/2010   KNEE ARTHROPLASTY  06/17/2011   Procedure: COMPUTER ASSISTED TOTAL KNEE ARTHROPLASTY;  Surgeon: Alta Corning, MD;  Location: [place];  Service: Orthopedics;  Laterality: Left;  TOTAL KNEE REPLACEMENT WITH GENERAL ANESTHESIA AND PRE OP FEMORAL NERVE BLOCK   PERIANAL CYST     POLYPECTOMY  08/09/2021   Procedure: POLYPECTOMY;  Surgeon: Mansouraty, Telford Nab., MD;  Location: [facility];  Service: Gastroenterology;;   SUBMUCOSAL TATTOO INJECTION  08/09/2021   Procedure: SUBMUCOSAL TATTOO INJECTION;  Surgeon: Irving Copas., MD;  Location: [facility];  Service: Gastroenterology;;    Social History:  reports that she has quit smoking. Her smoking use included cigarettes. She smoked an average of .25 packs per day. She does not have any smokeless tobacco history on file. She reports current alcohol use. She reports that she does not use drugs. Family History: family history includes Diabetes in her father; Pneumonia in her mother.  HOME MEDICATIONS: Allergies as of 09/02/2021       Reactions   Sulfa Antibiotics Anaphylaxis   Prednisone Other (See Comments)   hallucinations   Nsaids Other (See Comments)   Stomach pain, diarrhea   Other Other (See Comments)   Steroids: hallucinations        Medication List        Accurate as of September 02, 2021  7:26 AM. If you have any questions, ask your nurse or doctor.           ALPRAZolam 0.5 MG tablet Commonly known as: XANAX Take 1 tablet by mouth 2 times a day as needed What changed:  how much to take how to take this when to take this reasons to take this   budesonide-formoterol 160-4.5 MCG/ACT inhaler Commonly known as: SYMBICORT Inhale 2 puffs into the lungs 2 (two) times daily as needed (shortness of breath/wheezing).   ciprofloxacin 500 MG tablet Commonly known as: CIPRO Take 1 tablet (500 mg total) by mouth daily.   Dulera 200-5 MCG/ACT Aero Generic drug: mometasone-formoterol Inhale 2 puffs into the lungs daily as needed (Congestion).   folic acid 1 MG tablet Commonly known as: FOLVITE Take 1 tablet (1 mg total) by mouth daily.   furosemide 20 MG tablet Commonly known as: LASIX Take 3 tablets (60 mg total) by mouth daily.   hydrocortisone 10 MG tablet Commonly known as: CORTEF Take 2 tablets (20 mg total) by mouth daily.  Do not stop it abruptly unless told by the endocrinologist.   levETIRAcetam 500 MG tablet Commonly known as: KEPPRA Take 1 tablet by mouth 2 times a day What changed:  how much to take how to take this when to take this   loperamide 2 MG capsule Commonly known as: IMODIUM Take 1 capsule (2 mg total) by mouth as needed for diarrhea or loose stools.   midodrine 10 MG tablet Commonly known as: PROAMATINE Take 1 tablet (10 mg total) by mouth 3 (three) times daily with meals. What changed: when to take this   pantoprazole 40 MG tablet Commonly known as: PROTONIX Take 1 tablet (40 mg total) by mouth 2 (two) times daily before a meal. Twice daily for 53-month then back to once daily.   potassium chloride 10 MEQ tablet Commonly known as: KLOR-CON M Take 20 mEq by mouth daily.   primidone 50 MG tablet Commonly known as: MYSOLINE Take 100 mg by mouth every morning.   promethazine 12.5 MG tablet Commonly known as: PHENERGAN Take 1 tablet (12.5 mg total) by mouth every 6 (six) hours as needed for nausea or  vomiting.   spironolactone 100 MG tablet Commonly known as: ALDACTONE Take 1 tablet (100 mg total) by mouth daily.   sucralfate 1 GM/10ML suspension Commonly known as: Carafate Take 10 mLs (1 g total) by mouth 2 (two) times daily.   thiamine 100 MG tablet Commonly known as: VITAMIN B1 Take 1 tablet (100 mg total) by mouth daily. What changed: how much to take   traMADol 50 MG tablet Commonly known as: ULTRAM Take 50 mg by mouth every 8 (eight) hours as needed for moderate pain.          REVIEW OF SYSTEMS: A comprehensive ROS was conducted with the patient and is negative except as per HPI and below:  ROS     OBJECTIVE:  VS: There were no vitals taken for this visit.   Wt Readings from Last 3 Encounters:  08/31/21 230 lb (104.3  kg)  08/27/21 231 lb (104.8 kg)  08/09/21 220 lb (99.8 kg)     EXAM: General: Pt appears well and is in NAD  Hydration: Well-hydrated with moist mucous membranes and good skin turgor  Eyes: External eye exam normal without stare, lid lag or exophthalmos.  EOM intact.  PERRL.  Ears, Nose, Throat: Hearing: Grossly intact bilaterally Dental: Good dentition  Throat: Clear without mass, erythema or exudate  Neck: General: Supple without adenopathy. Thyroid: Thyroid size normal.  No goiter or nodules appreciated. No thyroid bruit.  Lungs: Clear with good BS bilat with no rales, rhonchi, or wheezes  Heart: Auscultation: RRR.  Abdomen: Normoactive bowel sounds, soft, nontender, without masses or organomegaly palpable  Extremities: Gait and station: Normal gait  Digits and nails: No clubbing, cyanosis, petechiae, or nodes Head and neck: Normal alignment and mobility BL UE: Normal ROM and strength. BL LE: No pretibial edema normal ROM and strength.  Skin: Hair: Texture and amount normal with gender appropriate distribution Skin Inspection: No rashes, acanthosis nigricans/skin tags. No lipohypertrophy Skin Palpation: Skin temperature, texture,  and thickness normal to palpation  Neuro: Cranial nerves: II - XII grossly intact  Cerebellar: Normal coordination and movement; no tremor Motor: Normal strength throughout DTRs: 2+ and symmetric in UE without delay in relaxation phase  Mental Status: Judgment, insight: Intact Orientation: Oriented to time, place, and person Memory: Intact for recent and remote events Mood and affect: No depression, anxiety, or agitation     DATA REVIEWED: ***    ASSESSMENT/PLAN/RECOMMENDATIONS:   ***    Medications :  Signed electronically by: Mack Guise, MD  [facility] End[facility]  [address] [place], [place] [address] Phone: [phone] FAX: [phone]   CC: Darrol Jump, NP [address] [place] [address] Phone: [phone] Fax: [phone]   Return to Endocrinology clinic as below: Future Appointments  Date Time Provider [place]  09/02/2021 10:10 AM Alyssamarie Mounsey, Melanie Crazier, MD [facility] None  09/28/2021  9:50 AM Sharyn Creamer, MD [facility]

## 2021-09-17 ENCOUNTER — Other Ambulatory Visit: Payer: Self-pay | Admitting: Gastroenterology

## 2021-09-22 ENCOUNTER — Other Ambulatory Visit: Payer: Self-pay

## 2021-09-22 MED ORDER — SUCRALFATE 1 GM/10ML PO SUSP: 1.0000 g | Freq: Two times a day (BID) | ORAL | 1 refills | Status: DC

## 2021-09-28 ENCOUNTER — Ambulatory Visit: Payer: Medicare HMO | Admitting: Internal Medicine

## 2021-10-05 ENCOUNTER — Ambulatory Visit: Payer: Medicare Other | Admitting: Neurology

## 2021-11-02 ENCOUNTER — Ambulatory Visit: Payer: Medicare HMO | Admitting: Internal Medicine

## 2021-11-05 ENCOUNTER — Ambulatory Visit: Payer: Medicare HMO | Admitting: Internal Medicine

## 2021-11-05 ENCOUNTER — Other Ambulatory Visit: Payer: Self-pay

## 2021-11-11 ENCOUNTER — Other Ambulatory Visit: Payer: Self-pay | Admitting: Gastroenterology

## 2021-12-13 ENCOUNTER — Ambulatory Visit: Payer: Medicare HMO | Admitting: Internal Medicine

## 2021-12-16 ENCOUNTER — Ambulatory Visit: Payer: Medicare HMO | Admitting: Internal Medicine

## 2021-12-20 ENCOUNTER — Other Ambulatory Visit (INDEPENDENT_AMBULATORY_CARE_PROVIDER_SITE_OTHER): Payer: Medicare HMO

## 2021-12-20 ENCOUNTER — Encounter: Payer: Self-pay | Admitting: Nurse Practitioner

## 2021-12-20 ENCOUNTER — Ambulatory Visit: Payer: Medicare HMO | Admitting: Nurse Practitioner

## 2021-12-20 ENCOUNTER — Telehealth: Payer: Self-pay | Admitting: Nurse Practitioner

## 2021-12-20 VITALS — BP 126/82 | HR 78 | Ht 61.0 in | Wt 247.6 lb

## 2021-12-20 DIAGNOSIS — Z1211 Encounter for screening for malignant neoplasm of colon: Secondary | ICD-10-CM

## 2021-12-20 DIAGNOSIS — K7031 Alcoholic cirrhosis of liver with ascites: Secondary | ICD-10-CM

## 2021-12-20 LAB — CBC WITH DIFFERENTIAL/PLATELET
Basophils Absolute: 0 10*3/uL (ref 0.0–0.1)
Basophils Relative: 0.3 % (ref 0.0–3.0)
Eosinophils Absolute: 0.1 10*3/uL (ref 0.0–0.7)
Eosinophils Relative: 0.8 % (ref 0.0–5.0)
HCT: 29.5 % — ABNORMAL LOW (ref 36.0–46.0)
Hemoglobin: 9.7 g/dL — ABNORMAL LOW (ref 12.0–15.0)
Lymphocytes Relative: 8 % — ABNORMAL LOW (ref 12.0–46.0)
Lymphs Abs: 0.6 10*3/uL — ABNORMAL LOW (ref 0.7–4.0)
MCHC: 32.8 g/dL (ref 30.0–36.0)
MCV: 78.6 fl (ref 78.0–100.0)
Monocytes Absolute: 0.6 10*3/uL (ref 0.1–1.0)
Monocytes Relative: 7.6 % (ref 3.0–12.0)
Neutro Abs: 6.2 10*3/uL (ref 1.4–7.7)
Neutrophils Relative %: 83.3 % — ABNORMAL HIGH (ref 43.0–77.0)
Platelets: 201 10*3/uL (ref 150.0–400.0)
RBC: 3.76 Mil/uL — ABNORMAL LOW (ref 3.87–5.11)
RDW: 18.9 % — ABNORMAL HIGH (ref 11.5–15.5)
WBC: 7.4 10*3/uL (ref 4.0–10.5)

## 2021-12-20 LAB — COMPREHENSIVE METABOLIC PANEL
ALT: 27 U/L (ref 0–35)
AST: 31 U/L (ref 0–37)
Albumin: 3.8 g/dL (ref 3.5–5.2)
Alkaline Phosphatase: 147 U/L — ABNORMAL HIGH (ref 39–117)
BUN: 27 mg/dL — ABNORMAL HIGH (ref 6–23)
CO2: 24 mEq/L (ref 19–32)
Calcium: 9.5 mg/dL (ref 8.4–10.5)
Chloride: 97 mEq/L (ref 96–112)
Creatinine, Ser: 1.6 mg/dL — ABNORMAL HIGH (ref 0.40–1.20)
GFR: 33.38 mL/min — ABNORMAL LOW (ref >60.00)
Glucose, Bld: 134 mg/dL — ABNORMAL HIGH (ref 70–99)
Potassium: 4.6 mEq/L (ref 3.5–5.1)
Sodium: 132 mEq/L — ABNORMAL LOW (ref 135–145)
Total Bilirubin: 0.6 mg/dL (ref 0.2–1.2)
Total Protein: 7.3 g/dL (ref 6.0–8.3)

## 2021-12-20 LAB — PROTIME-INR
INR: 1.1 ratio — ABNORMAL HIGH (ref 0.8–1.0)
Prothrombin Time: 12.3 s (ref 9.6–13.1)

## 2021-12-20 MED ORDER — NA SULFATE-K SULFATE-MG SULF 17.5-3.13-1.6 GM/177ML PO SOLN: 1.0000 | Freq: Once | ORAL | 0 refills | Status: AC

## 2021-12-20 NOTE — Progress Notes (Signed)
I agree with the assessment and plan as outlined by Ms. Kennedy-Smith. 

## 2021-12-20 NOTE — Telephone Encounter (Signed)
Message Received: Today Noralyn Pick, NP  Gillermina Hu, RN [facility] Browner. Beth will coordinate Colonoscopy at [facility] date.

## 2021-12-20 NOTE — Patient Instructions (Addendum)
You have been scheduled for a colonoscopy. Please follow written instructions given to you at your visit today.  Please pick up your prep supplies at the pharmacy within the next 1-3 days. If you use inhalers (even only as needed), please bring them with you on the day of your procedure.   Your provider has requested that you go to the basement level for lab work before leaving today. Press "B" on the elevator. The lab is located at the first door on the left as you exit the elevator.   Due to recent changes in healthcare laws, you may see the results of your imaging and laboratory studies on MyChart before your provider has had a chance to review them.  We understand that in some cases there may be results that are confusing or concerning to you. Not all laboratory results come back in the same time frame and the provider may be waiting for multiple results in order to interpret others.  Please give [place] 48 hours in order for your provider to thoroughly review all the results before contacting the office for clarification of your results.    Thank you for trusting me with your gastrointestinal care!   Carl Best, CRNP

## 2021-12-20 NOTE — Telephone Encounter (Signed)
Carla Leonard, pls cancel EGD scheduled 12/24/2021 in [facility] as patient had documented seizure activity end of September 01, 2021. Patient was referred to neurology. Patient will need to re-schedule colonoscopy with Dr. Lorenso Courier at [facility]. Please coordinate with Dr. Lorenso Courier to schedule her colonoscopy. I attempted to call the patient at this time, however, she did not answer.

## 2021-12-20 NOTE — Telephone Encounter (Signed)
Left message for pt to call back  °

## 2021-12-20 NOTE — Progress Notes (Signed)
12/20/2021 Carla Leonard [id number] [birth year]   Chief Complaint: Schedule a colonoscopy   History of Present Illness: Carla Leonard is a 66 year old female with a past medical history of obesity, hypertension, hypercholesterolemia, seizures, adrenal insufficiency, DVT, CKD stage III, chronic anemia, cirrhosis with history of ascites, SBP and thrombocytopenia. MELD 17.  She was admitted to the hospital 01/2021 secondary to having an umbilical hernia with abdominal wall abscess/cellulitis. She is followed by Dr. Lorenso Courier.  She presents today to schedule a colonoscopy which is required prior to be considering for a liver transplant. She is followed by Barbarann Ehlers NP at [facility] next follow-up appointment scheduled 12/29/2021.  She stated feeling better today than she has ever felt in her lifetime.  She denies having any issues with confusion or mental fogginess.  No nausea or vomiting.  No GERD symptoms.  No abdominal pain.  No jaundice.  She is passing normal formed brown bowel movement once daily.  No rectal bleeding or black stools.  She is urinating normal yellow-colored urine.  No alcohol since 04/2019.  She stated she prep for a colonoscopy more than 5 years ago but when she presented to the outpatient endoscopy center, she was holding her husband's coffee and the medical staff presumed she was drinking the coffee and canceled her procedure with concern she was not NPO.   She underwent an EGD to rule out esophageal varices and EUS at the same time due to a dilated CBD per MRCP.  The EGD identified grade 1 esophageal varices with diffuse portal hypertensive gastropathy and hematin like material was oozing in the gastric antrum and body distant with GAVE with APC.  Duodenal polyp was resected and clipped.  The EUS showed 1 stone in the cystic duct with dilatation in the CBD and in the common hepatic duct without identifiable stones.   She remains on Pantoprazole 40 mg once daily  without GERD symptoms.  She is compliant with taking Furosemide 20 mg daily and Spironolactone 100 mg daily.  Labs at [facility] 09/22/2021: AFP 2.7.  WBC 5.2.  Hemoglobin 9.7.  Hematocrit 31.4.  Platelet 115.  MCV 92.  Sodium 135.  Potassium 4.1.  BUN 38.  Creatinine 1.9.  Glucose 141.  Albumin 3.0.  Total bili 0.6.  AST 36.  ALT 25.  INR 1.1.  CT cirrhosis abdomen at [facility] 08/23/2021: Findings:   Liver:  Nodular hepatic contour.   Focal hepatic lesions:  None   Complications of chronic liver disease:  splenomegaly. Trace free fluid.  Hepatic vascular anatomy:  The right hepatic artery is replaced from the  superior mesenteric artery. Left hepatic artery arises from the celiac  trunk.   Biliary system:  Status post cholecystectomy with postcholecystectomy  reservoir effect, but similar to prior   Findings outside of the Hepatobiliary system:   - Pancreas: Normal in appearance.   - Adrenal Glands: Normal in appearance.   - Kidneys: No suspicious renal lesions. No hydronephrosis.   - Abdominal and Pelvic Vasculature: No abdominal aortic aneurysm. Mild  atherosclerotic plaque.   - Gastrointestinal Tract: No abnormal dilation or wall thickening.   - Peritoneum/Mesentery/Retroperitoneum: Trace free fluid and mesenteric  edema, decreased from the 05/06/2021 CT.  No free intraperitoneal air.   - Lymph Nodes: No retroperitoneal or  mesenteric lymphadenopathy.    - Body Wall: Partially visualized ventral hernia.   - Musculoskeletal:  No aggressive appearing osseous lesions. Degenerative  change of the spine.  Impression:   Hepatic findings:  Cirrhotic liver morphology with mild splenomegaly and trace free fluid. No  suspicious hepatic mass.   GI PROCEDURES:   EGD/EUS 08/31/2021 (Common bile duct dilation (acquired) seen on MRCP, Elevated liver enzymes, Cirrhosis rule out esophageal varices).  EGD Impression: - No gross lesions in esophagus proximally. Grade I esophageal varices  distally. - Z-line regular, 35 cm from the incisors. - Diffuse portal hypertensive gastropathy. - Erythematous mucosa in the stomach. Biopsied. - Hematin (altered blood/coffee-ground-like material) and small oozing noted in the gastric antrum and in the gastric body - lavaged away showing GAVE. 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 APC performed to actively oozing areas. - Dodenal polyp. Resected and retrieved. Clip (MR conditional) was placed. - Erythematous duodenopathy (Portal Duodenopathy?). Biopsied. - Nodule found in the duodenum. Biopsied. Tattooed contralateral wall. - Normal mucosa was found in the major papilla. EUS Impression: - There was no sign of significant pathology in the pancreatic head, genu of the pancreas, pancreatic body and pancreatic tail. - One stone was visualized endosonographically in the cystic duct. - There was dilation in the common bile duct and in the common hepatic duct. but no clear stones at this time. - There was diffuse abnormal echotexture in the visualized portion of the liver. This was characterized by a heterogenous appearance. - No malignant-appearing lymph nodes were visualized in the celiac region (level 20), peripancreatic region and porta hepatis region. - Monitor Hgb/Hct, as she may benefit from repeat APC or RFA of the more significant amount of GAVE that is present if issues of anemia develop in future.   Past Medical History:  Diagnosis Date   Arthritis    Depression    History of kidney stones    Hypertension     Past Surgical History:  Procedure Laterality Date   APPENDECTOMY     BIOPSY  08/09/2021   Procedure: BIOPSY;  Surgeon: Rush Landmark, Telford Nab., MD;  Location: [facility];  Service: Gastroenterology;;   CHOLECYSTECTOMY     ELBOW SURGERY     RIGHT   ESOPHAGOGASTRODUODENOSCOPY N/A 08/09/2021   Procedure: ESOPHAGOGASTRODUODENOSCOPY (EGD);  Surgeon: Irving Copas., MD;  Location:  [facility];  Service: Gastroenterology;  Laterality: N/A;   EUS N/A 08/09/2021   Procedure: UPPER ENDOSCOPIC ULTRASOUND (EUS) LINEAR ;  Surgeon: Irving Copas., MD;  Location: [facility];  Service: Gastroenterology;  Laterality: N/A;   HEMOSTASIS CLIP PLACEMENT  08/09/2021   Procedure: HEMOSTASIS CLIP PLACEMENT;  Surgeon: Irving Copas., MD;  Location: [facility];  Service: Gastroenterology;;   HOT HEMOSTASIS N/A 08/09/2021   Procedure: HOT HEMOSTASIS (ARGON PLASMA COAGULATION/BICAP);  Surgeon: Irving Copas., MD;  Location: [facility];  Service: Gastroenterology;  Laterality: N/A;   IR PARACENTESIS  04/28/2021   IR PARACENTESIS  05/06/2021   IR PARACENTESIS  05/11/2021   JOINT REPLACEMENT     right TKA 06/2010   KNEE ARTHROPLASTY  06/17/2011   Procedure: COMPUTER ASSISTED TOTAL KNEE ARTHROPLASTY;  Surgeon: Alta Corning, MD;  Location: [facility];  Service: Orthopedics;  Laterality: Left;  TOTAL KNEE REPLACEMENT WITH GENERAL ANESTHESIA AND PRE OP FEMORAL NERVE BLOCK   PERIANAL CYST     POLYPECTOMY  08/09/2021   Procedure: POLYPECTOMY;  Surgeon: Mansouraty, Telford Nab., MD;  Location: [facility];  Service: Gastroenterology;;   SUBMUCOSAL TATTOO INJECTION  08/09/2021   Procedure: SUBMUCOSAL TATTOO INJECTION;  Surgeon: Irving Copas., MD;  Location: [facility];  Service: Gastroenterology;;   Current  Outpatient Medications on File Prior to Visit  Medication Sig Dispense Refill   ALPRAZolam (XANAX) 0.5 MG tablet Take 1 tablet by mouth 2 times a day as needed (Patient taking differently: Take 0.5 mg by mouth 3 (three) times daily.) 60 tablet 2   budesonide-formoterol (SYMBICORT) 160-4.5 MCG/ACT inhaler Inhale 2 puffs into the lungs 2 (two) times daily as needed (shortness of breath/wheezing).     ciprofloxacin (CIPRO) 500 MG tablet Take 1 tablet (500 mg total) by mouth daily. 30 tablet 5   folic acid (FOLVITE) 1 MG tablet Take 1 tablet (1 mg total) by mouth daily. 30  tablet 1   furosemide (LASIX) 20 MG tablet Take 3 tablets (60 mg total) by mouth daily. 90 tablet 0   hydrocortisone (CORTEF) 10 MG tablet Take 2 tablets (20 mg total) by mouth daily.  Do not stop it abruptly unless told by the endocrinologist. 60 tablet 1   levETIRAcetam (KEPPRA) 500 MG tablet Take 1 tablet by mouth 2 times a day (Patient taking differently: Take 500 mg by mouth 2 (two) times daily.) 60 tablet 10   loperamide (IMODIUM) 2 MG capsule Take 1 capsule (2 mg total) by mouth as needed for diarrhea or loose stools. 15 capsule 0   midodrine (PROAMATINE) 10 MG tablet Take 1 tablet (10 mg total) by mouth 3 (three) times daily with meals. (Patient taking differently: Take 10 mg by mouth 2 (two) times daily.) 90 tablet 0   mometasone-formoterol (DULERA) 200-5 MCG/ACT AERO Inhale 2 puffs into the lungs daily as needed (Congestion).     pantoprazole (PROTONIX) 40 MG tablet Take 1 tablet (40 mg total) by mouth 2 (two) times daily before a meal. Twice daily for 2-month then back to once daily. 60 tablet 6   potassium chloride (KLOR-CON M) 10 MEQ tablet Take 20 mEq by mouth daily.     primidone (MYSOLINE) 50 MG tablet Take 100 mg by mouth every morning.     promethazine (PHENERGAN) 12.5 MG tablet Take 1 tablet (12.5 mg total) by mouth every 6 (six) hours as needed for nausea or vomiting. 30 tablet 0   spironolactone (ALDACTONE) 100 MG tablet Take 1 tablet (100 mg total) by mouth daily. 30 tablet 0   sucralfate (CARAFATE) 1 GM/10ML suspension TAKE 10 MLS (1 G TOTAL) BY MOUTH 2 (TWO) TIMES DAILY. 420 mL 1   thiamine 100 MG tablet Take 1 tablet (100 mg total) by mouth daily. (Patient taking differently: Take 250 mg by mouth daily.) 30 tablet 1   traMADol (ULTRAM) 50 MG tablet Take 50 mg by mouth every 8 (eight) hours as needed for moderate pain.     No current facility-administered medications on file prior to visit.   Allergies  Allergen Reactions   Sulfa Antibiotics Anaphylaxis   Prednisone  Other (See Comments)    hallucinations   Nsaids Other (See Comments)    Stomach pain, diarrhea   Other Other (See Comments)    Steroids: hallucinations   Current Medications, Allergies, Past Medical History, Past Surgical History, Family History and Social History were reviewed in [employer].  Review of Systems:   Constitutional: Negative for fever, sweats, chills or weight loss.  Respiratory: Negative for shortness of breath.   Cardiovascular: Negative for chest pain, palpitations and leg swelling.  Gastrointestinal: See HPI.  Musculoskeletal: + Back pain.  Neurological: + Seizure activity 09/2021. Negative for dizziness, headaches or paresthesias.   Physical Exam: BP 126/82   Pulse 78  Ht '5\' 1"'$  (1.549 m)   Wt 247 lb 9.6 oz (112.3 kg)   BMI 46.78 kg/m  Wt Readings from Last 3 Encounters:  12/20/21 247 lb 9.6 oz (112.3 kg)  08/31/21 230 lb (104.3 kg)  08/27/21 231 lb (104.8 kg)    General: 66 year old female in no acute distress. Head: Normocephalic and atraumatic. Eyes: No scleral icterus. Conjunctiva pink . Ears: Normal auditory acuity. Mouth: Dentition intact. No ulcers or lesions.  Lungs: Clear throughout to auscultation. Heart: Regular rate and rhythm, no murmur. Abdomen: Soft obese abdomen.  Nontender and nondistended. No masses or hepatomegaly. Normal bowel sounds x 4 quadrants.  No ascites.  Large RUQ scar intact. Rectal: Deferred. Musculoskeletal: Symmetrical with no gross deformities. Extremities: No edema. Neurological: Alert oriented x 4. No focal deficits.  Psychological: Alert and cooperative. Normal mood and affect  Assessment and Recommendations:  19) 67 year old female with MetALD cirrhosis. MELD 17. Abstinent from alcohol since 04/2019. She is undergoing a liver transplant evaluation in [facility] for which a screening colonoscopy is required prior to completing pretransplant workup. EGD 08/2021 showed rate 1 esophageal varices and  diffuse portal hypertensive gastropathy not on Nadolol or NSBB. -Colonoscopy benefits and risks discussed including risk with sedation, risk of bleeding, perforation and infection. Colonoscopy will need to be rescheduled at [facility] as patient had seizure activity per ED records 09/01/2021. -CBC, CMP and INR -Proceed with liver transplant evaluation at [facility] -Next surveillance liver imaging due 02/2022  2) GERD, stable  -Continue Pantoprazole 40 mg p.o. daily  3) Chronic anemia, secondary to cirrhosis/splenomegaly and CKD  4) Seizure activity 09/01/2021, patient was referred to neurology

## 2021-12-21 ENCOUNTER — Telehealth: Payer: Self-pay

## 2021-12-21 ENCOUNTER — Other Ambulatory Visit: Payer: Self-pay

## 2021-12-21 ENCOUNTER — Telehealth: Payer: Self-pay | Admitting: Nurse Practitioner

## 2021-12-21 DIAGNOSIS — Z1211 Encounter for screening for malignant neoplasm of colon: Secondary | ICD-10-CM

## 2021-12-21 NOTE — Telephone Encounter (Signed)
Patient is patient was informed her colonoscopy 12/24/2021 in [place] was canceled.  She will follow-up with her neurologist as scheduled 01/17/2022, await neuro clearance prior to scheduling a colonoscopy with Dr. Lorenso Courier

## 2021-12-21 NOTE — Telephone Encounter (Addendum)
Called the patient to give her the date for her nurse visit and the date of the colonoscopy. No answer. Left her a detailed voicemail. Also I have mailed her a letter with the information including dates and times.  She is scheduled for the nurse visit on 02/07/22 at 12:30 and the colonoscopy at [facility] on 02/24/22 arriving at 12:00.  Patient will see Dr April Manson at [facility] on 01/17/22 as a new patient to establish care. I have asked that the appointment note include medical clearance for the colonoscopy on 02/24/22 as well.

## 2021-12-21 NOTE — Telephone Encounter (Signed)
Pt made aware of Carl Best NP recommendations: EGD canceled in the [place] for 12/24/2021 in the [place]: Pt made aware  Eustaquio Maize will coordinate Colonoscopy at [facility] date. Pt Made aware: Pt verbalized understanding with all questions answered.

## 2021-12-23 ENCOUNTER — Telehealth: Payer: Self-pay

## 2021-12-23 ENCOUNTER — Other Ambulatory Visit: Payer: Self-pay | Admitting: Internal Medicine

## 2021-12-23 NOTE — Telephone Encounter (Signed)
Contacted patient and patient is aware and understanding of her lab results.

## 2021-12-24 ENCOUNTER — Encounter: Payer: Medicare HMO | Admitting: Internal Medicine

## 2022-01-17 ENCOUNTER — Ambulatory Visit: Payer: Medicare HMO | Admitting: Neurology

## 2022-01-17 ENCOUNTER — Encounter: Payer: Self-pay | Admitting: Neurology

## 2022-01-17 VITALS — BP 137/80 | HR 76 | Ht 61.0 in | Wt 249.0 lb

## 2022-01-17 DIAGNOSIS — G40909 Epilepsy, unspecified, not intractable, without status epilepticus: Secondary | ICD-10-CM | POA: Diagnosis not present

## 2022-01-17 DIAGNOSIS — K7460 Unspecified cirrhosis of liver: Secondary | ICD-10-CM

## 2022-01-17 NOTE — Patient Instructions (Addendum)
Continue with Keppra 500 mg twice daily  Continue with Primidone 100 mg daily Continue your other medications  From a neurological perspective (seizure disorder), patient cleared for surgery Follow up in 6 months or sooner if worse

## 2022-01-17 NOTE — Progress Notes (Signed)
[facility]  PATIENT: Carla Leonard DOB: 1955/03/13  REQUESTING CLINICIAN: Maudie Flakes, MD HISTORY FROM: Patient  REASON FOR VISIT: History of seizures, needs clearance    HISTORICAL  CHIEF COMPLAINT:  Chief Complaint  Patient presents with   New Patient (Initial Visit)    Rm 12. Alone. [facility]; need surgical clearance for colonscopy 02/24/22 when pt comes for appt. NP/internal referral for seizures.    HISTORY OF PRESENT ILLNESS:  This is a 67 year old woman with medical history including liver cirrhosis, awaiting liver transplant, hypertension, obesity, seizure disorder who is presenting for surgical clearance.  Patient reports her first seizure occurred in November 2021.  She had 1 episode where she raised her arms, has uncontrollable shaking and fell down.  Patient reports at that time EMS was called and she was admitted to the hospital.  She had a full workup and was started on Keppra since then she has been on the Keppra 500 mg twice daily.  She reported lately she has another event of word finding difficulty.  She said that she knows the word but they are not coming out.  She presented to the hospital in August with the same symptoms, she reports since started on primidone she has not had any additional episodes.  Currently she is on primidone 100 mg daily with the Keppra.  She is doing her presurgical workup and her doctors need a neurological clearance for surgery in terms of the seizures.  She has no other complaints or concerns.    OTHER MEDICAL CONDITIONS: Liver cirrhosis, Obesity, Hypertension, seizure disorder    REVIEW OF SYSTEMS: Full 14 system review of systems performed and negative with exception of: As noted in the HPI   ALLERGIES: Allergies  Allergen Reactions   Sulfa Antibiotics Anaphylaxis   Prednisone Other (See Comments)    hallucinations   Nsaids Other (See Comments)    Stomach pain, diarrhea   Other Other  (See Comments)    Steroids: hallucinations    HOME MEDICATIONS: Outpatient Medications Prior to Visit  Medication Sig Dispense Refill   ALPRAZolam (XANAX) 0.5 MG tablet Take 1 tablet by mouth 2 times a day as needed (Patient taking differently: Take 0.5 mg by mouth 3 (three) times daily.) 60 tablet 2   budesonide-formoterol (SYMBICORT) 160-4.5 MCG/ACT inhaler Inhale 2 puffs into the lungs 2 (two) times daily as needed (shortness of breath/wheezing).     ciprofloxacin (CIPRO) 500 MG tablet TAKE 1 TABLET EVERY DAY 30 tablet 3   folic acid (FOLVITE) 1 MG tablet Take 1 tablet (1 mg total) by mouth daily. 30 tablet 1   furosemide (LASIX) 20 MG tablet Take 3 tablets (60 mg total) by mouth daily. 90 tablet 0   hydrocortisone (CORTEF) 10 MG tablet Take 2 tablets (20 mg total) by mouth daily.  Do not stop it abruptly unless told by the endocrinologist. 60 tablet 1   levETIRAcetam (KEPPRA) 500 MG tablet Take 1 tablet by mouth 2 times a day (Patient taking differently: Take 500 mg by mouth 2 (two) times daily.) 60 tablet 10   loperamide (IMODIUM) 2 MG capsule Take 1 capsule (2 mg total) by mouth as needed for diarrhea or loose stools. 15 capsule 0   midodrine (PROAMATINE) 10 MG tablet Take 1 tablet (10 mg total) by mouth 3 (three) times daily with meals. (Patient taking differently: Take 10 mg by mouth 2 (two) times daily.) 90 tablet 0   mometasone-formoterol (DULERA) 200-5 MCG/ACT AERO Inhale 2  puffs into the lungs daily as needed (Congestion).     pantoprazole (PROTONIX) 40 MG tablet Take 1 tablet (40 mg total) by mouth 2 (two) times daily before a meal. Twice daily for 38-month then back to once daily. 60 tablet 6   potassium chloride (KLOR-CON M) 10 MEQ tablet Take 20 mEq by mouth daily.     primidone (MYSOLINE) 50 MG tablet Take 100 mg by mouth every morning.     promethazine (PHENERGAN) 12.5 MG tablet Take 1 tablet (12.5 mg total) by mouth every 6 (six) hours as needed for nausea or vomiting. 30  tablet 0   spironolactone (ALDACTONE) 100 MG tablet Take 1 tablet (100 mg total) by mouth daily. 30 tablet 0   sucralfate (CARAFATE) 1 GM/10ML suspension TAKE 10 MLS (1 G TOTAL) BY MOUTH 2 (TWO) TIMES DAILY. 420 mL 1   thiamine 100 MG tablet Take 1 tablet (100 mg total) by mouth daily. (Patient taking differently: Take 250 mg by mouth daily.) 30 tablet 1   traMADol (ULTRAM) 50 MG tablet Take 50 mg by mouth every 8 (eight) hours as needed for moderate pain.     No facility-administered medications prior to visit.    PAST MEDICAL HISTORY: Past Medical History:  Diagnosis Date   Arthritis    Depression    History of kidney stones    Hypertension     PAST SURGICAL HISTORY: Past Surgical History:  Procedure Laterality Date   APPENDECTOMY     BIOPSY  08/09/2021   Procedure: BIOPSY;  Surgeon: Mansouraty, GTelford Nab, MD;  [facility];  Service: Gastroenterology;;   CHOLECYSTECTOMY     ELBOW SURGERY     RIGHT   ESOPHAGOGASTRODUODENOSCOPY N/A 08/09/2021   Procedure: ESOPHAGOGASTRODUODENOSCOPY (EGD);  Surgeon: MIrving Copas, MD;  [facility];  Service: Gastroenterology;  Laterality: N/A;   EUS N/A 08/09/2021   Procedure: UPPER ENDOSCOPIC ULTRASOUND (EUS) LINEAR ;  Surgeon: MIrving Copas, MD;  [facility];  Service: Gastroenterology;  Laterality: N/A;   HEMOSTASIS CLIP PLACEMENT  08/09/2021   Procedure: HEMOSTASIS CLIP PLACEMENT;  Surgeon: MIrving Copas, MD;  [facility];  Service: Gastroenterology;;   HOT HEMOSTASIS N/A 08/09/2021   Procedure: HOT HEMOSTASIS (ARGON PLASMA COAGULATION/BICAP);  Surgeon: MIrving Copas, MD;  [facility];  Service: Gastroenterology;  Laterality: N/A;   IR PARACENTESIS  04/28/2021   IR PARACENTESIS  05/06/2021   IR PARACENTESIS  05/11/2021   JOINT REPLACEMENT     right TKA 06/2010   KNEE ARTHROPLASTY  06/17/2011   Procedure: COMPUTER ASSISTED TOTAL KNEE ARTHROPLASTY;  Surgeon: JAlta Corning MD;  [facility]  Service: Orthopedics;  Laterality: Left;  TOTAL KNEE REPLACEMENT WITH GENERAL ANESTHESIA AND PRE OP FEMORAL NERVE BLOCK   PERIANAL CYST     POLYPECTOMY  08/09/2021   Procedure: POLYPECTOMY;  Surgeon: Mansouraty, GTelford Nab, MD;  [facility];  Service: Gastroenterology;;   SUBMUCOSAL TATTOO INJECTION  08/09/2021   Procedure: SUBMUCOSAL TATTOO INJECTION;  Surgeon: MIrving Copas, MD;  [facility];  Service: Gastroenterology;;    FAMILY HISTORY: Family History  Problem Relation Age of Onset   Pneumonia Mother    Diabetes Father    Colon cancer Neg Hx    Esophageal cancer Neg Hx    Stomach cancer Neg Hx    Pancreatic cancer Neg Hx    Colon polyps Neg Hx     SOCIAL HISTORY: Social History   Socioeconomic History  Marital status: Legally Separated    Spouse name: Not on file   Number of children: 1   Years of education: Not on file   Highest education level: Not on file  Occupational History   Occupation: Retired Quarry manager  Tobacco Use   Smoking status: Former    Packs/day: 0.25    Types: Cigarettes   Smokeless tobacco: Not on file   Tobacco comments:    Former 2 ppd  Scientific laboratory technician Use: Every day   Devices: Non Nicotine  Substance and Sexual Activity   Alcohol use: Yes   Drug use: No   Sexual activity: Not on file  Other Topics Concern   Not on file  Social History Narrative   Not on file   Social Determinants of Health   Financial Resource Strain: Not on file  Food Insecurity: Not on file  Transportation Needs: Not on file  Physical Activity: Not on file  Stress: Not on file  Social Connections: Not on file  Intimate Partner Violence: Not on file    PHYSICAL EXAM  GENERAL EXAM/CONSTITUTIONAL: Vitals:  Vitals:   01/17/22 1303  BP: 137/80  Pulse: 76  Weight: 249 lb (112.9 kg)  Height: '5\' 1"'$  (1.549 m)   Body mass index is 47.05 kg/m. Wt Readings from Last 3 Encounters:  01/17/22 249 lb  (112.9 kg)  12/20/21 247 lb 9.6 oz (112.3 kg)  08/31/21 230 lb (104.3 kg)   Patient is in no distress; well developed, nourished and groomed; neck is supple  EYES: Visual fields full to confrontation, Extraocular movements intacts,   MUSCULOSKELETAL: Gait, strength, tone, movements noted in Neurologic exam below  NEUROLOGIC: MENTAL STATUS:      No data to display         awake, alert, oriented to person, place and time recent and remote memory intact normal attention and concentration language fluent, comprehension intact, naming intact fund of knowledge appropriate  CRANIAL NERVE:  2nd, 3rd, 4th, 6th - Visual fields full to confrontation, extraocular muscles intact, no nystagmus 5th - facial sensation symmetric 7th - facial strength symmetric 8th - hearing intact 9th - palate elevates symmetrically, uvula midline 11th - shoulder shrug symmetric 12th - tongue protrusion midline  MOTOR:  normal bulk and tone, full strength in the BUE, BLE  SENSORY:  normal and symmetric to light touch  COORDINATION:  finger-nose-finger, fine finger movements normal  REFLEXES:  deep tendon reflexes present and symmetric  GAIT/STATION:  Ambulates with a cane      DIAGNOSTIC DATA (LABS, IMAGING, TESTING) - I reviewed patient records, labs, notes, testing and imaging myself where available.  Lab Results  Component Value Date   WBC 7.4 12/20/2021   HGB 9.7 (L) 12/20/2021   HCT 29.5 (L) 12/20/2021   MCV 78.6 12/20/2021   PLT 201.0 12/20/2021      Component Value Date/Time   NA 132 (L) 12/20/2021 1210   K 4.6 12/20/2021 1210   CL 97 12/20/2021 1210   CO2 24 12/20/2021 1210   GLUCOSE 134 (H) 12/20/2021 1210   BUN 27 (H) 12/20/2021 1210   CREATININE 1.60 (H) 12/20/2021 1210   CALCIUM 9.5 12/20/2021 1210   PROT 7.3 12/20/2021 1210   ALBUMIN 3.8 12/20/2021 1210   AST 31 12/20/2021 1210   ALT 27 12/20/2021 1210   ALKPHOS 147 (H) 12/20/2021 1210   BILITOT 0.6  12/20/2021 1210   GFRNONAA 32 (L) 08/31/2021 1946   GFRAA 55 (L) 06/17/2015 2979  No results found for: "CHOL", "HDL", "[place]", "LDLDIRECT", "TRIG", "CHOLHDL" No results found for: "HGBA1C" Lab Results  Component Value Date   VITAMINB12 578 03/29/2021   No results found for: "TSH"   MRI Brain 09/01/2022 1. No acute intracranial abnormality. 2. Patchy and confluent T2/FLAIR hyperintensity involving the supratentorial cerebral white matter and right cerebellum, nonspecific, but overall moderate in nature. While these findings may be related to chronic microvascular ischemic disease, possible demyelinating disease could also have this appearance.  ASSESSMENT AND PLAN  67 y.o. year old female with medical conditions including liver cirrhosis, seizure disorder, obesity, hypertension who is presenting needing a surgical clearance.  In terms of the seizures, she did describe 2 types. The first one where she have her arms raised up in the air prior to passing out and the second one described as inability speech.  Her last seizure was in August and was described as inability to produce speech.  She is doing well on Keppra 500 mg twice daily and primidone 100 mg daily.  At this time would recommend patient to continue current medications, and from a neurological perspective, she is cleared for surgery.  Advised her to take the medications as scheduled, she does not need to skip it or stop it prior to surgery.  Follow-up in 62-month   1. Seizure disorder ([facility]   2. Hepatic cirrhosis, unspecified hepatic cirrhosis type, unspecified whether ascites present ([facility]      Patient Instructions  Continue with Keppra 500 mg twice daily  Continue with Primidone 100 mg daily Continue your other medications  From a neurological perspective (seizure disorder), patient cleared for surgery Follow up in 6 months or sooner if worse   No orders of the defined types were placed in this encounter.   No orders  of the defined types were placed in this encounter.   Return in about 6 months (around 07/18/2022).  I have spent a total of 50 minutes dedicated to this patient today, preparing to see patient, performing a medically appropriate examination and evaluation, ordering tests and/or medications and procedures, and counseling and educating the patient/family/caregiver; independently interpreting result and communicating results to the family/patient/caregiver; and documenting clinical information in the electronic medical record.    AAlric Ran MD 01/17/2022, 1:47 PM  [facility] [address] [place] [phone]

## 2022-02-07 ENCOUNTER — Encounter: Payer: Self-pay | Admitting: Internal Medicine

## 2022-02-07 ENCOUNTER — Ambulatory Visit (AMBULATORY_SURGERY_CENTER): Payer: Medicare HMO | Admitting: *Deleted

## 2022-02-07 VITALS — Ht 61.0 in | Wt 240.0 lb

## 2022-02-07 DIAGNOSIS — Z1211 Encounter for screening for malignant neoplasm of colon: Secondary | ICD-10-CM

## 2022-02-07 MED ORDER — PEG 3350-KCL-NA BICARB-NACL 420 G PO SOLR: 4000.0000 mL | Freq: Once | ORAL | 0 refills | Status: AC

## 2022-02-07 NOTE — Progress Notes (Signed)
No egg or soy allergy known to patient  No issues known to pt with past sedation with any surgeries or procedures Patient denies ever being told they had issues or difficulty with intubation  No FH of Malignant Hyperthermia Pt is not on diet pills Pt is not on  home 02  Pt is not on blood thinners  Pt denies issues with constipation  No A fib or A flutter Have any cardiac testing pending--no Pt instructed to use Singlecare.com or GoodRx for a price reduction on prep   

## 2022-02-10 ENCOUNTER — Telehealth: Payer: Self-pay | Admitting: Internal Medicine

## 2022-02-10 NOTE — Telephone Encounter (Signed)
Carla Leonard called requesting speak with a nurse in reference a Medication( Cipro) that the patient is currently taking , want to know if the patient have to take medication  in a daily basis because the bottle says no refill however pharmacy keeps re filling it . Call her at [phone].Thanks

## 2022-02-11 NOTE — Telephone Encounter (Signed)
Patient is on Cipro ongoing for spontaneous bacterial peritonitis prophylaxis. Called Ms Andres Ege back. Voicemail left.

## 2022-02-14 NOTE — Telephone Encounter (Signed)
Returned call. Ms Carla Leonard is currently seeing patients. Information regarding the reason for the ongoing Cipro left with her assistant. My direct number left for her as well, in case of other questions.

## 2022-02-17 ENCOUNTER — Encounter (HOSPITAL_COMMUNITY): Payer: Self-pay | Admitting: Internal Medicine

## 2022-02-24 ENCOUNTER — Ambulatory Visit (HOSPITAL_COMMUNITY): Payer: Medicare HMO | Admitting: Certified Registered Nurse Anesthetist

## 2022-02-24 ENCOUNTER — Encounter (HOSPITAL_COMMUNITY): Payer: Self-pay | Admitting: Internal Medicine

## 2022-02-24 ENCOUNTER — Other Ambulatory Visit: Payer: Self-pay

## 2022-02-24 ENCOUNTER — Ambulatory Visit (HOSPITAL_COMMUNITY)
Admission: RE | Admit: 2022-02-24 | Discharge: 2022-02-24 | Disposition: A | Discharge status: Home / Self Care | Payer: Medicare HMO | Source: Ambulatory Visit | Attending: Internal Medicine | Admitting: Internal Medicine

## 2022-02-24 ENCOUNTER — Encounter (HOSPITAL_COMMUNITY)
Admission: RE | Disposition: A | Discharge status: Home / Self Care | Payer: Self-pay | Source: Ambulatory Visit | Attending: Internal Medicine

## 2022-02-24 ENCOUNTER — Ambulatory Visit (HOSPITAL_BASED_OUTPATIENT_CLINIC_OR_DEPARTMENT_OTHER): Payer: Medicare HMO | Admitting: Certified Registered Nurse Anesthetist

## 2022-02-24 DIAGNOSIS — G40909 Epilepsy, unspecified, not intractable, without status epilepticus: Secondary | ICD-10-CM | POA: Insufficient documentation

## 2022-02-24 DIAGNOSIS — D125 Benign neoplasm of sigmoid colon: Secondary | ICD-10-CM

## 2022-02-24 DIAGNOSIS — K648 Other hemorrhoids: Secondary | ICD-10-CM | POA: Insufficient documentation

## 2022-02-24 DIAGNOSIS — Z1211 Encounter for screening for malignant neoplasm of colon: Secondary | ICD-10-CM

## 2022-02-24 DIAGNOSIS — J45909 Unspecified asthma, uncomplicated: Secondary | ICD-10-CM | POA: Diagnosis not present

## 2022-02-24 DIAGNOSIS — Z09 Encounter for follow-up examination after completed treatment for conditions other than malignant neoplasm: Secondary | ICD-10-CM | POA: Insufficient documentation

## 2022-02-24 DIAGNOSIS — Z87891 Personal history of nicotine dependence: Secondary | ICD-10-CM

## 2022-02-24 DIAGNOSIS — I10 Essential (primary) hypertension: Secondary | ICD-10-CM | POA: Diagnosis not present

## 2022-02-24 DIAGNOSIS — F418 Other specified anxiety disorders: Secondary | ICD-10-CM | POA: Insufficient documentation

## 2022-02-24 DIAGNOSIS — K219 Gastro-esophageal reflux disease without esophagitis: Secondary | ICD-10-CM | POA: Diagnosis not present

## 2022-02-24 DIAGNOSIS — Z6841 Body Mass Index (BMI) 40.0 and over, adult: Secondary | ICD-10-CM | POA: Diagnosis not present

## 2022-02-24 HISTORY — PX: POLYPECTOMY: SHX5525

## 2022-02-24 HISTORY — PX: COLONOSCOPY WITH PROPOFOL: SHX5780

## 2022-02-24 SURGERY — COLONOSCOPY WITH PROPOFOL
Anesthesia: Monitor Anesthesia Care

## 2022-02-24 MED ORDER — PROPOFOL 10 MG/ML IV BOLUS
INTRAVENOUS | Status: DC | PRN
  Administered 2022-02-24: 40 mg via INTRAVENOUS
  Administered 2022-02-24: 30 mg via INTRAVENOUS
  Administered 2022-02-24: 20 mg via INTRAVENOUS
  Administered 2022-02-24: 10 mg via INTRAVENOUS
  Administered 2022-02-24: 30 mg via INTRAVENOUS
  Administered 2022-02-24: 20 mg via INTRAVENOUS
  Administered 2022-02-24: 30 mg via INTRAVENOUS
  Administered 2022-02-24: 50 mg via INTRAVENOUS

## 2022-02-24 MED ORDER — PROPOFOL 500 MG/50ML IV EMUL
INTRAVENOUS | Status: DC | PRN
  Administered 2022-02-24: 125 ug/kg/min via INTRAVENOUS

## 2022-02-24 MED ORDER — SODIUM CHLORIDE 0.9 % IV SOLN
INTRAVENOUS | Status: DC
  Administered 2022-02-24: 500 mL via INTRAVENOUS

## 2022-02-24 MED ORDER — LACTATED RINGERS IV SOLN: INTRAVENOUS | Status: DC | PRN

## 2022-02-24 MED ORDER — PROPOFOL 1000 MG/100ML IV EMUL
INTRAVENOUS | Status: AC
  Filled 2022-02-24: qty 100

## 2022-02-24 MED ORDER — PHENYLEPHRINE 80 MCG/ML (10ML) SYRINGE FOR IV PUSH (FOR BLOOD PRESSURE SUPPORT)
PREFILLED_SYRINGE | INTRAVENOUS | Status: DC | PRN
  Administered 2022-02-24: 80 ug via INTRAVENOUS
  Administered 2022-02-24: 160 ug via INTRAVENOUS

## 2022-02-24 MED ORDER — PROPOFOL 10 MG/ML IV BOLUS
INTRAVENOUS | Status: AC
  Filled 2022-02-24: qty 20

## 2022-02-24 MED ORDER — LIDOCAINE 2% (20 MG/ML) 5 ML SYRINGE
INTRAMUSCULAR | Status: DC | PRN
  Administered 2022-02-24: 100 mg via INTRAVENOUS

## 2022-02-24 SURGICAL SUPPLY — 22 items

## 2022-02-24 NOTE — H&P (Signed)
GASTROENTEROLOGY PROCEDURE H&P NOTE   Primary Care Physician: Tommi Emery, FNP    Reason for Procedure:   Colon cancer screening  Plan:    Colonoscopy  Patient is appropriate for endoscopic procedure(s) in the hospital setting.  The nature of the procedure, as well as the risks, benefits, and alternatives were carefully and thoroughly reviewed with the patient. Ample time for discussion and questions allowed. The patient understood, was satisfied, and agreed to proceed.     HPI: Carla Leonard is a 67 y.o. female who presents for colonoscopy for evaluation of colon cancer screening .  Patient was most recently seen in the Gastroenterology Clinic on 12/20/21.  No interval change in medical history since that appointment. Please refer to that note for full details regarding GI history and clinical presentation.   Past Medical History:  Diagnosis Date   Allergy    "spring time",no medications   Anxiety    Arthritis    back,knee,hands   Asthma    "as a child"   Depression    History of kidney stones    Hypertension    pt.denies updated 02/07/22   Insomnia    Seizures ([place])    last 2 1/2 years ago    Past Surgical History:  Procedure Laterality Date   APPENDECTOMY     BIOPSY  08/09/2021   Procedure: BIOPSY;  Surgeon: Irving Copas., MD;  [facility];  Service: Gastroenterology;;   CHOLECYSTECTOMY     ELBOW SURGERY     RIGHT   ESOPHAGOGASTRODUODENOSCOPY N/A 08/09/2021   Procedure: ESOPHAGOGASTRODUODENOSCOPY (EGD);  Surgeon: Irving Copas., MD;  [facility];  Service: Gastroenterology;  Laterality: N/A;   EUS N/A 08/09/2021   Procedure: UPPER ENDOSCOPIC ULTRASOUND (EUS) LINEAR ;  Surgeon: Irving Copas., MD;  [facility];  Service: Gastroenterology;  Laterality: N/A;   HEMOSTASIS CLIP PLACEMENT  08/09/2021   Procedure: HEMOSTASIS CLIP PLACEMENT;  Surgeon: Irving Copas., MD;  [facility];   Service: Gastroenterology;;   HOT HEMOSTASIS N/A 08/09/2021   Procedure: HOT HEMOSTASIS (ARGON PLASMA COAGULATION/BICAP);  Surgeon: Irving Copas., MD;  [facility];  Service: Gastroenterology;  Laterality: N/A;   IR PARACENTESIS  04/28/2021   IR PARACENTESIS  05/06/2021   IR PARACENTESIS  05/11/2021   JOINT REPLACEMENT     right TKA 06/2010   KNEE ARTHROPLASTY  06/17/2011   Procedure: COMPUTER ASSISTED TOTAL KNEE ARTHROPLASTY;  Surgeon: Alta Corning, MD;  Location: [facility];  Service: Orthopedics;  Laterality: Left;  TOTAL KNEE REPLACEMENT WITH GENERAL ANESTHESIA AND PRE OP FEMORAL NERVE BLOCK   PERIANAL CYST     POLYPECTOMY  08/09/2021   Procedure: POLYPECTOMY;  Surgeon: Mansouraty, Telford Nab., MD;  [facility];  Service: Gastroenterology;;   SUBMUCOSAL TATTOO INJECTION  08/09/2021   Procedure: SUBMUCOSAL TATTOO INJECTION;  Surgeon: Irving Copas., MD;  [facility];  Service: Gastroenterology;;    Prior to Admission medications   Medication Sig Start Date End Date Taking? Authorizing Provider  ALPRAZolam Duanne Moron) 0.5 MG tablet Take 1 tablet by mouth 2 times a day as needed Patient taking differently: Take 0.5 mg by mouth 3 (three) times daily as needed for anxiety. 03/05/21  Yes   b complex vitamins capsule Take 2 capsules by mouth daily.   Yes [provider]  Cholecalciferol (DIALYVITE VITAMIN D 5000) 125 MCG (5000 UT) capsule Take 10,000 Units by mouth daily.   Yes [provider]  ciprofloxacin (  CIPRO) 500 MG tablet TAKE 1 TABLET EVERY DAY 12/23/21  Yes Sharyn Creamer, MD  folic acid (FOLVITE) 1 MG tablet Take 1 tablet (1 mg total) by mouth daily. 05/12/21  Yes Shelly Coss, MD  furosemide (LASIX) 40 MG tablet Take 80 mg by mouth daily.   Yes [provider]  hydrocortisone (CORTEF) 10 MG tablet Take 2 tablets (20 mg total) by mouth daily.  Do not stop it abruptly unless told by the endocrinologist. 04/12/21  Yes Thurnell Lose, MD  levETIRAcetam (KEPPRA) 500 MG tablet Take 1 tablet by mouth 2 times a day 08/21/20  Yes   midodrine (PROAMATINE) 10 MG tablet Take 1 tablet (10 mg total) by mouth 3 (three) times daily with meals. 04/12/21  Yes Thurnell Lose, MD  pantoprazole (PROTONIX) 40 MG tablet Take 1 tablet (40 mg total) by mouth 2 (two) times daily before a meal. Twice daily for 24-month then back to once daily. Patient taking differently: Take 40 mg by mouth daily. 08/09/21  Yes Mansouraty, GTelford Nab, MD  potassium chloride (KLOR-CON M) 10 MEQ tablet Take 10 mEq by mouth 3 (three) times daily. 04/23/21  Yes [provider]  primidone (MYSOLINE) 50 MG tablet Take 100 mg by mouth every morning. 01/21/21  Yes [provider]  promethazine (PHENERGAN) 12.5 MG tablet Take 1 tablet (12.5 mg total) by mouth every 6 (six) hours as needed for nausea or vomiting. 08/06/21  Yes DSharyn Creamer MD  spironolactone (ALDACTONE) 50 MG tablet Take 100 mg by mouth daily.   Yes [provider]  sucralfate (CARAFATE) 1 GM/10ML suspension TAKE 10 MLS (1 G TOTAL) BY MOUTH 2 (TWO) TIMES DAILY. 11/11/21 02/23/22 Yes Mansouraty, GTelford Nab, MD  traMADol (ULTRAM) 50 MG tablet Take 50 mg by mouth every 8 (eight) hours as needed for moderate pain. 03/25/21  Yes [provider]  albuterol (VENTOLIN HFA) 108 (90 Base) MCG/ACT inhaler Inhale 2 puffs into the lungs every 6 (six) hours as needed for wheezing or shortness of breath.    [provider]  furosemide (LASIX) 20 MG tablet Take 3 tablets (60 mg total) by mouth daily. Patient not taking: Reported on 02/23/2022 05/18/21   DSharyn Creamer MD  loperamide (IMODIUM) 2 MG capsule Take 1 capsule (2 mg total) by mouth as needed for diarrhea or loose stools. Patient not taking: Reported on 02/23/2022 05/11/21   AShelly Coss MD  spironolactone (ALDACTONE) 100 MG tablet Take 1 tablet (100 mg total) by mouth daily. Patient not taking: Reported on  02/23/2022 05/18/21   DSharyn Creamer MD  thiamine 100 MG tablet Take 1 tablet (100 mg total) by mouth daily. Patient not taking: Reported on 02/07/2022 05/12/21   AShelly Coss MD    Current Facility-Administered Medications  Medication Dose Route Frequency Provider Last Rate Last Admin   0.9 %  sodium chloride infusion   Intravenous Continuous DSharyn Creamer MD 20 mL/hr at 02/24/22 1204 500 mL at 02/24/22 1204    Allergies as of 12/21/2021 - Review Complete 12/20/2021  Allergen Reaction Noted   Sulfa antibiotics Anaphylaxis 04/27/2011   Prednisone Other (See Comments) 05/12/2015   Nsaids Other (See Comments) 04/27/2011   Other Other (See Comments) 05/27/2015    Family History  Problem Relation Age of Onset   Pneumonia Mother    Diabetes Father    Colon cancer Neg Hx    Esophageal cancer Neg Hx    Stomach cancer Neg Hx  Pancreatic cancer Neg Hx    Colon polyps Neg Hx    Crohn's disease Neg Hx    Rectal cancer Neg Hx    Ulcerative colitis Neg Hx     Social History   Socioeconomic History   Marital status: Divorced    Spouse name: Not on file   Number of children: 1   Years of education: Not on file   Highest education level: Not on file  Occupational History   Occupation: Retired Quarry manager  Tobacco Use   Smoking status: Former    Packs/day: 0.25    Types: Cigarettes   Smokeless tobacco: Never   Tobacco comments:    Former 2 ppd  Scientific laboratory technician Use: Some days   Devices: Non Nicotine  Substance and Sexual Activity   Alcohol use: Not Currently    Comment: quit 1 year ago updated 02/07/22   Drug use: Not Currently    Types: Marijuana    Comment: quit 7 months ago updated 02/07/22   Sexual activity: Not on file  Other Topics Concern   Not on file  Social History Narrative   Not on file   Social Determinants of Health   Financial Resource Strain: Not on file  Food Insecurity: Not on file  Transportation Needs: Not on file  Physical Activity: Not on file   Stress: Not on file  Social Connections: Not on file  Intimate Partner Violence: Not on file    Physical Exam: Vital signs in last 24 hours: BP (!) 157/63   Pulse 81   Temp 98.4 F (36.9 C) (Tympanic)   Resp 18   Ht 5' 1"$  (1.549 m)   Wt 108.9 kg   SpO2 100%   BMI 45.36 kg/m  GEN: NAD EYE: Sclerae anicteric ENT: MMM CV: Non-tachycardic Pulm: No increased WOB GI: Soft NEURO:  Alert & Oriented   Christia Reading, MD [facility]   02/24/2022 12:33 PM

## 2022-02-24 NOTE — Discharge Instructions (Addendum)
YOU HAD AN ENDOSCOPIC PROCEDURE TODAY: Refer to the procedure report and other information in the discharge instructions given to you for any specific questions about what was found during the examination. If this information does not answer your questions, please call [place] office at [phone] to clarify.   YOU SHOULD EXPECT: Some feelings of bloating in the abdomen. Passage of more gas than usual. Walking can help get rid of the air that was put into your GI tract during the procedure and reduce the bloating. If you had a lower endoscopy (such as a colonoscopy or flexible sigmoidoscopy) you may notice spotting of blood in your stool or on the toilet paper. Some abdominal soreness may be present for a day or two, also.  DIET: Your first meal following the procedure should be a light meal and then it is ok to progress to your normal diet. A half-sandwich or bowl of soup is an example of a good first meal. Heavy or fried foods are harder to digest and may make you feel nauseous or bloated. Drink plenty of fluids but you should avoid alcoholic beverages for 24 hours. If you had a esophageal dilation, please see attached instructions for diet.    ACTIVITY: Your care partner should take you home directly after the procedure. You should plan to take it easy, moving slowly for the rest of the day. You can resume normal activity the day after the procedure however YOU SHOULD NOT DRIVE, use power tools, machinery or perform tasks that involve climbing or major physical exertion for 24 hours (because of the sedation medicines used during the test).   SYMPTOMS TO REPORT IMMEDIATELY: A gastroenterologist can be reached at any hour. Please call [phone]  for any of the following symptoms:  Following lower endoscopy (colonoscopy, flexible sigmoidoscopy) Excessive amounts of blood in the stool  Significant tenderness, worsening of abdominal pains  Swelling of the abdomen that is new, acute  Fever of 100 or  higher  Following upper endoscopy (EGD, EUS, ERCP, esophageal dilation) Vomiting of blood or coffee ground material  New, significant abdominal pain  New, significant chest pain or pain under the shoulder blades  Painful or persistently difficult swallowing  New shortness of breath  Black, tarry-looking or red, bloody stools  FOLLOW UP:  If any biopsies were taken you will be contacted by phone or by letter within the next 1-3 weeks. Call [phone]  if you have not heard about the biopsies in 3 weeks.  Please also call with any specific questions about appointments or follow up tests.             [facility] ENDOSCOPY [address] [place], [place]  [address] Phone:  [phone]   February 24, 2022  Patient: Carla Leonard  Date of Birth: [birth year]  Date of Visit: February 24, 2022    To Whom It May Concern:  Carla Leonard was seen and treated on February 24, 2022 at [place] was her care partner and ride for the day and the patient needed someone to stay with her after the procedure.    .           If you have any questions or concerns, please don't hesitate to call [phone].   Sincerely,       Treatment Team:  Attending Provider: Sharyn Creamer, MD

## 2022-02-24 NOTE — Anesthesia Postprocedure Evaluation (Signed)
Anesthesia Post Note  Patient: Carla Leonard  Procedure(s) Performed: COLONOSCOPY WITH PROPOFOL POLYPECTOMY     Patient location during evaluation: PACU Anesthesia Type: MAC Level of consciousness: awake and alert Pain management: pain level controlled Vital Signs Assessment: post-procedure vital signs reviewed and stable Respiratory status: spontaneous breathing, nonlabored ventilation and respiratory function stable Cardiovascular status: blood pressure returned to baseline Postop Assessment: no apparent nausea or vomiting Anesthetic complications: no   No notable events documented.  Last Vitals:  Vitals:   02/24/22 1430 02/24/22 1435  BP: 133/68 (!) 146/78  Pulse: 93 89  Resp: (!) 27 19  Temp: 36.4 C   SpO2: 100% 98%    Last Pain:  Vitals:   02/24/22 1430  TempSrc: Temporal  PainSc:                  Marthenia Rolling

## 2022-02-24 NOTE — Transfer of Care (Signed)
Immediate Anesthesia Transfer of Care Note  Patient: Carla Leonard  Procedure(s) Performed: COLONOSCOPY WITH PROPOFOL POLYPECTOMY  Patient Location: Endoscopy Unit  Anesthesia Type:MAC  Level of Consciousness: drowsy and patient cooperative  Airway & Oxygen Therapy: Patient Spontanous Breathing and Patient connected to face mask oxygen  Post-op Assessment: Report given to RN and Post -op Vital signs reviewed and stable  Post vital signs: Reviewed and stable  Last Vitals:  Vitals Value Taken Time  BP 113/50 02/24/22 1420  Temp    Pulse 86 02/24/22 1422  Resp 24 02/24/22 1422  SpO2 100 % 02/24/22 1422  Vitals shown include unvalidated device data.  Last Pain:  Vitals:   02/24/22 1419  TempSrc:   PainSc: 0-No pain         Complications: No notable events documented.

## 2022-02-24 NOTE — Anesthesia Preprocedure Evaluation (Addendum)
Anesthesia Evaluation  Patient identified by MRN, date of birth, ID band Patient awake    Reviewed: Allergy & Precautions, NPO status , Patient's Chart, lab work & pertinent test results  History of Anesthesia Complications Negative for: history of anesthetic complications  Airway Mallampati: II  TM Distance: >3 FB Neck ROM: Full    Dental no notable dental hx.    Pulmonary asthma , former smoker   Pulmonary exam normal        Cardiovascular hypertension, Normal cardiovascular exam     Neuro/Psych Seizures -,   Anxiety Depression       GI/Hepatic ,GERD  Medicated and Controlled,,(+) Cirrhosis         Endo/Other    Morbid obesity  Renal/GU Renal InsufficiencyRenal disease (Cr 1.6)  negative genitourinary   Musculoskeletal  (+) Arthritis ,    Abdominal   Peds  Hematology negative hematology ROS (+)   Anesthesia Other Findings Day of surgery medications reviewed with patient.  Reproductive/Obstetrics negative OB ROS                             Anesthesia Physical Anesthesia Plan  ASA: 4  Anesthesia Plan: MAC   Post-op Pain Management: Minimal or no pain anticipated   Induction:   PONV Risk Score and Plan: 2 and Treatment may vary due to age or medical condition and Propofol infusion  Airway Management Planned: Natural Airway and Nasal Cannula  Additional Equipment: None  Intra-op Plan:   Post-operative Plan:   Informed Consent: I have reviewed the patients History and Physical, chart, labs and discussed the procedure including the risks, benefits and alternatives for the proposed anesthesia with the patient or authorized representative who has indicated his/her understanding and acceptance.       Plan Discussed with: CRNA  Anesthesia Plan Comments:         Anesthesia Quick Evaluation

## 2022-02-24 NOTE — Op Note (Signed)
[facility] Patient Name: Carla Leonard Procedure Date: 02/24/2022 MRN: [id number] Attending MD: Georgian Co , , [id number] Date of Birth: 1955/09/06 CSN: [id number] Age: 67 Admit Type: Outpatient Procedure:                Colonoscopy Indications:              Screening for colorectal malignant neoplasm Providers:                Adline Mango" Michaelle Birks, RN, Luan Moore, Technician, Adair Laundry, CRNA Referring MD:             Laverda Page Medicines:                Monitored Anesthesia Care Complications:            No immediate complications. Estimated Blood Loss:     Estimated blood loss was minimal. Procedure:                Pre-Anesthesia Assessment:                           - Prior to the procedure, a History and Physical                            was performed, and patient medications and                            allergies were reviewed. The patient's tolerance of                            previous anesthesia was also reviewed. The risks                            and benefits of the procedure and the sedation                            options and risks were discussed with the patient.                            All questions were answered, and informed consent                            was obtained. Prior Anticoagulants: The patient has                            taken no anticoagulant or antiplatelet agents. ASA                            Grade Assessment: III - A patient with severe                            systemic disease. After reviewing the risks and  benefits, the patient was deemed in satisfactory                            condition to undergo the procedure.                           After obtaining informed consent, the colonoscope                            was passed under direct vision. Throughout the                            procedure, the patient's blood  pressure, pulse, and                            oxygen saturations were monitored continuously. The                            [id number]) Olympus colonoscope was                            introduced through the anus and advanced to the the                            cecum, identified by appendiceal orifice and                            ileocecal valve. The colonoscopy was performed                            without difficulty. The patient tolerated the                            procedure well. The quality of the bowel                            preparation was good. The terminal ileum, ileocecal                            valve, appendiceal orifice, and rectum were                            photographed. Scope In: 1:43:45 PM Scope Out: 2:10:56 PM Scope Withdrawal Time: 0 hours 20 minutes 33 seconds  Total Procedure Duration: 0 hours 27 minutes 11 seconds  Findings:      A 3 mm polyp was found in the sigmoid colon. The polyp was sessile. The       polyp was removed with a cold snare. Resection and retrieval were       complete.      Non-bleeding internal hemorrhoids were found during retroflexion. Impression:               - One 3 mm polyp in the sigmoid colon, removed with  a cold snare. Resected and retrieved.                           - Non-bleeding internal hemorrhoids. Moderate Sedation:      Not Applicable - Patient had care per Anesthesia. Recommendation:           - Discharge patient to home (with escort).                           - Await pathology results.                           - The findings and recommendations were discussed                            with the patient.                           - Return to GI clinic in 2 months for follow up of                            cirrhosis. Procedure Code(s):        --- Professional ---                           [phone], Colonoscopy, flexible; with removal of                             tumor(s), polyp(s), or other lesion(s) by snare                            technique Diagnosis Code(s):        --- Professional ---                           Z12.11, Encounter for screening for malignant                            neoplasm of colon                           D12.5, Benign neoplasm of sigmoid colon                           K64.8, Other hemorrhoids CPT copyright 2022 American Medical Association. All rights reserved. The codes documented in this report are preliminary and upon coder review may  be revised to meet current compliance requirements. Dr Georgian Co "Lyndee Leo" Thornton,  02/24/2022 2:15:00 PM Number of Addenda: 0

## 2022-02-27 ENCOUNTER — Encounter (HOSPITAL_COMMUNITY): Payer: Self-pay | Admitting: Internal Medicine

## 2022-02-28 ENCOUNTER — Telehealth: Payer: Self-pay | Admitting: Internal Medicine

## 2022-02-28 NOTE — Telephone Encounter (Signed)
Patient is currently admitted to [facility] in [name]. She reports she had an increase in the abdominal pain and began vomiting 02/25/22. It became "unbearable" and she called 911. She was taken to the hospital. "My hernia had ruptured and they did emergency surgery." She states they removed "4" of my intestines and I had leakage."  Surgeons Drs Keenan Bachelor and Lilia Pro.  Patient wanted you to know because "she's my doctor and all."

## 2022-02-28 NOTE — Telephone Encounter (Signed)
PT had a colonoscopy done 2/22. She is now in the hospital and they had to remove 4 inches of her colon. She wanted to make [place] aware of the situation. Requesting call back.

## 2022-03-01 NOTE — Telephone Encounter (Signed)
Called the patient to assist her with scheduling a follow up appointment. No answer. I left her a voicemail and asked her to call to get an appointment for follow up.

## 2022-03-02 ENCOUNTER — Encounter: Payer: Self-pay | Admitting: Internal Medicine

## 2022-03-02 LAB — SURGICAL PATHOLOGY

## 2022-03-02 NOTE — Telephone Encounter (Signed)
Patient is returning Beth's call, went to schedule follow up appointment with her she said she will call back when she has access to a pen and paper.

## 2022-03-06 ENCOUNTER — Other Ambulatory Visit: Payer: Self-pay | Admitting: Internal Medicine

## 2022-04-11 ENCOUNTER — Other Ambulatory Visit: Payer: Self-pay | Admitting: Gastroenterology

## 2022-04-12 ENCOUNTER — Ambulatory Visit: Payer: Medicare HMO | Admitting: Internal Medicine

## 2022-04-26 ENCOUNTER — Encounter: Payer: Self-pay | Admitting: Internal Medicine

## 2022-04-26 ENCOUNTER — Other Ambulatory Visit: Payer: Self-pay

## 2022-04-26 ENCOUNTER — Ambulatory Visit (INDEPENDENT_AMBULATORY_CARE_PROVIDER_SITE_OTHER): Payer: Medicare HMO | Admitting: Internal Medicine

## 2022-04-26 ENCOUNTER — Other Ambulatory Visit (INDEPENDENT_AMBULATORY_CARE_PROVIDER_SITE_OTHER): Payer: Medicare HMO

## 2022-04-26 ENCOUNTER — Telehealth: Payer: Self-pay

## 2022-04-26 VITALS — BP 130/82 | HR 86 | Ht 61.0 in | Wt 250.8 lb

## 2022-04-26 DIAGNOSIS — R188 Other ascites: Secondary | ICD-10-CM

## 2022-04-26 DIAGNOSIS — K7460 Unspecified cirrhosis of liver: Secondary | ICD-10-CM | POA: Diagnosis not present

## 2022-04-26 DIAGNOSIS — K7031 Alcoholic cirrhosis of liver with ascites: Secondary | ICD-10-CM

## 2022-04-26 DIAGNOSIS — D509 Iron deficiency anemia, unspecified: Secondary | ICD-10-CM

## 2022-04-26 DIAGNOSIS — K31819 Angiodysplasia of stomach and duodenum without bleeding: Secondary | ICD-10-CM

## 2022-04-26 LAB — COMPREHENSIVE METABOLIC PANEL
ALT: 17 U/L (ref 0–35)
AST: 25 U/L (ref 0–37)
Albumin: 3 g/dL — ABNORMAL LOW (ref 3.5–5.2)
Alkaline Phosphatase: 168 U/L — ABNORMAL HIGH (ref 39–117)
BUN: 21 mg/dL (ref 6–23)
CO2: 29 mEq/L (ref 19–32)
Calcium: 9 mg/dL (ref 8.4–10.5)
Chloride: 99 mEq/L (ref 96–112)
Creatinine, Ser: 1.38 mg/dL — ABNORMAL HIGH (ref 0.40–1.20)
GFR: 39.77 mL/min — ABNORMAL LOW (ref >60.00)
Glucose, Bld: 126 mg/dL — ABNORMAL HIGH (ref 70–99)
Potassium: 4.5 mEq/L (ref 3.5–5.1)
Sodium: 135 mEq/L (ref 135–145)
Total Bilirubin: 0.5 mg/dL (ref 0.2–1.2)
Total Protein: 6.6 g/dL (ref 6.0–8.3)

## 2022-04-26 LAB — CBC WITH DIFFERENTIAL/PLATELET
Basophils Absolute: 0.1 10*3/uL (ref 0.0–0.1)
Basophils Relative: 1 % (ref 0.0–3.0)
Eosinophils Absolute: 0.1 10*3/uL (ref 0.0–0.7)
Eosinophils Relative: 0.9 % (ref 0.0–5.0)
HCT: 21.9 % — CL (ref 36.0–46.0)
Hemoglobin: 7 g/dL — CL (ref 12.0–15.0)
Lymphocytes Relative: 9.5 % — ABNORMAL LOW (ref 12.0–46.0)
Lymphs Abs: 0.7 10*3/uL (ref 0.7–4.0)
MCHC: 32 g/dL (ref 30.0–36.0)
MCV: 74.9 fl — ABNORMAL LOW (ref 78.0–100.0)
Monocytes Absolute: 0.7 10*3/uL (ref 0.1–1.0)
Monocytes Relative: 9.2 % (ref 3.0–12.0)
Neutro Abs: 5.8 10*3/uL (ref 1.4–7.7)
Neutrophils Relative %: 79.4 % — ABNORMAL HIGH (ref 43.0–77.0)
Platelets: 224 10*3/uL (ref 150.0–400.0)
RBC: 2.92 Mil/uL — ABNORMAL LOW (ref 3.87–5.11)
RDW: 19.7 % — ABNORMAL HIGH (ref 11.5–15.5)
WBC: 7.3 10*3/uL (ref 4.0–10.5)

## 2022-04-26 LAB — PROTIME-INR
INR: 1.2 ratio — ABNORMAL HIGH (ref 0.8–1.0)
Prothrombin Time: 12.8 s (ref 9.6–13.1)

## 2022-04-26 MED ORDER — SPIRONOLACTONE 50 MG PO TABS: 150.0000 mg | ORAL_TABLET | Freq: Three times a day (TID) | ORAL | 0 refills | Status: DC

## 2022-04-26 MED ORDER — POTASSIUM CHLORIDE CRYS ER 20 MEQ PO TBCR: 20.0000 meq | EXTENDED_RELEASE_TABLET | Freq: Two times a day (BID) | ORAL | 1 refills | Status: DC

## 2022-04-26 NOTE — Progress Notes (Signed)
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  She has been recovering well from her umbilical hernia repair.  She does have some small amounts of drainage from the wound at her umbilical hernia but this is been clear drainage.  Denies any pus in her wound drainage.  She changes her wound dressing every other day.  She is supposed to be getting home health visits from a wound care nurse.  She has an appointment with her umbilical hernia repair surgeon in 2 weeks.  During her hospitalization, she was found to have a left adnexal mass that was 8 cm.  Thus she is undergoing evaluation by Dr. Tresa Endo for consideration of ovarian mass surgery.  Noted some left-sided back pain, which she attributes to her ovarian mass. Abdominal swelling has been fine. Denies excessive amounts of swelling. She has stable swelling in her BLE. Denies SOB. She takes Lasix 80 mg QD and spironolactone 100 mg QD. She is following a low sodium diet. Denies confusion or blood in the stools. She is now using a walker instead of a cane.  Labs 03/2022: CMP with low Na of 131, elevated Cr of 1.62, low albumin of 3, elevated alk phos of 238. CBC with low Hb of 8.8 and nml platelets of 237.  CT cirrhosis abdomen at [facility] 08/23/2021: Impression:   Hepatic findings:  Cirrhotic liver morphology with mild splenomegaly and trace free fluid. No suspicious hepatic mass.   Liver U/S 04/12/22: Impression:  Slightly coarsened liver  echotexture consistent with patient's known  cirrhosis diagnosis. No focal lesions identified. Portal vein is patent  with expected hepatopedal flow.   GI PROCEDURES:   EGD/EUS 08/31/2021  EGD Impression: - No gross lesions in esophagus proximally. Grade I esophageal varices distally. - Z-line regular, 35 cm from the incisors. - Diffuse portal hypertensive gastropathy. - Erythematous mucosa in the stomach. Biopsied. - Hematin (altered blood/coffee-ground-like material) and small oozing noted in the gastric antrum and in the gastric body - lavaged away showing GAVE. 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 APC performed to actively oozing areas. - Dodenal polyp. Resected and retrieved. Clip (MR conditional) was placed. - Erythematous duodenopathy (Portal Duodenopathy?). Biopsied. - Nodule found in the duodenum. Biopsied. Tattooed contralateral wall. - Normal mucosa was found in the major papilla. EUS Impression: - There was no sign of significant pathology in the pancreatic head, genu of the pancreas, pancreatic body and pancreatic tail. - One stone was visualized endosonographically in the cystic duct. - There was dilation in the common bile duct and in the common hepatic duct. but no clear stones at this time. - There was diffuse abnormal echotexture in the visualized portion of the liver. This was characterized by a heterogenous appearance. - No malignant-appearing lymph nodes were visualized in the celiac region (level 20), peripancreatic region and porta hepatis region. - Monitor Hgb/Hct, as she may benefit from repeat APC or RFA of the more significant amount of GAVE that is present if issues of anemia develop in future. Path: A. DUODENUM, BIOPSY:  -  Duodenal mucosa with no specific histopathologic changes  - Negative for increased intraepithelial lymphocytes or villous  architectural changes B. DUODENUM, LESION, BIOPSY:  - Duodenal mucosa with mild  nonspecific hyperplastic change and focal  acute inflammation  - Negative for dysplasia or malignancy  C. DUODENUM, BULB, POLYPECTOMY:  - Polypoid duodenal mucosa showing chronic duodenitis with surface  gastric foveolar metaplasia, suggestive of nodular peptic duodenitis  - Negative for dysplasia or malignancy  D. STOMACH, BIOPSY:  - Gastric antral mucosa with mild nonspecific reactive gastropathy  - Gastric oxyntic mucosa with no specific histopathologic changes  - Helicobacter pylori-like organisms are not identified on routine HE  stain   Colonoscopy 02/24/22:  Path: A. COLON, SIGMOID, POLYPECTOMY:  - Hyperplastic polyp  - Negative for malignancy   Current Outpatient Medications on File Prior to Visit  Medication Sig Dispense Refill   albuterol (VENTOLIN HFA) 108 (90 Base) MCG/ACT inhaler Inhale 2 puffs into the lungs every 6 (six) hours as needed for wheezing or shortness of breath.     ALPRAZolam (XANAX) 0.5 MG tablet Take 1 tablet by mouth 2 times a day as needed (Patient taking differently: Take 0.5 mg by mouth 3 (three) times daily as needed for anxiety.) 60 tablet 2   b complex vitamins capsule Take 2 capsules by mouth daily.     Cholecalciferol (DIALYVITE VITAMIN D 5000) 125 MCG (5000 UT) capsule Take 10,000 Units by mouth daily.     ciprofloxacin (CIPRO) 500 MG tablet TAKE 1 TABLET EVERY DAY 90 tablet 3   folic acid (FOLVITE) 1 MG tablet Take 1 tablet (1 mg total) by mouth daily. 30 tablet 1   furosemide (LASIX) 20 MG tablet Take 3 tablets (60 mg total) by mouth daily. 90 tablet 0   furosemide (LASIX) 40 MG tablet Take 80 mg by mouth daily.     hydrocortisone (CORTEF) 10 MG tablet Take 2 tablets (20 mg total) by mouth daily.  Do not stop it abruptly unless told by the endocrinologist. 60 tablet 1   levETIRAcetam (KEPPRA) 500 MG tablet Take 1 tablet by mouth 2 times a day 60 tablet 10   loperamide (IMODIUM) 2 MG capsule Take 1 capsule (2 mg total) by mouth as needed for  diarrhea or loose stools. 15 capsule 0   midodrine (PROAMATINE) 10 MG tablet Take 1 tablet (10 mg total) by mouth 3 (three) times daily with meals. 90 tablet 0   pantoprazole (PROTONIX) 40 MG tablet Take 1 tablet (40 mg total) by mouth 2 (two) times daily before a meal. Twice daily for 20-months then back to once daily. (Patient taking differently: Take 40 mg by mouth daily.) 60 tablet 6   potassium chloride (KLOR-CON M) 10 MEQ tablet Take 10 mEq by mouth 3 (three) times daily.     primidone (MYSOLINE) 50 MG tablet Take 100 mg by mouth every morning.     promethazine (PHENERGAN) 12.5 MG tablet Take 1 tablet (12.5 mg total) by mouth every 6 (six) hours as needed for nausea or vomiting. 30 tablet 0   spironolactone (ALDACTONE) 100 MG tablet Take 1 tablet (100 mg total) by mouth daily. 30 tablet 0   spironolactone (ALDACTONE) 50 MG tablet Take 100 mg by mouth daily.     thiamine 100 MG tablet Take 1 tablet (100 mg total) by mouth daily. 30 tablet 1   traMADol (ULTRAM) 50 MG tablet Take 50 mg by mouth every 8 (eight) hours as needed for moderate pain.  sucralfate (CARAFATE) 1 GM/10ML suspension TAKE 10 MLS (1 G TOTAL) BY MOUTH 2 (TWO) TIMES DAILY. 420 mL 1   No current facility-administered medications on file prior to visit.   Physical Exam: BP 130/82   Pulse 86   Ht 5\' 1"  (1.549 m)   Wt 250 lb 12.8 oz (113.8 kg)   BMI 47.39 kg/m  Wt Readings from Last 3 Encounters:  04/26/22 250 lb 12.8 oz (113.8 kg)  02/24/22 240 lb 1.3 oz (108.9 kg)  02/07/22 240 lb (108.9 kg)    General: 67 year old female in no acute distress. Head: Normocephalic and atraumatic. Eyes: No scleral icterus. Conjunctiva pink . Ears: Normal auditory acuity. Mouth: Dentition intact. No ulcers or lesions.  Lungs: Clear throughout to auscultation. Heart: Regular rate and rhythm, no murmur. Abdomen: Soft obese abdomen. Wound dressing over umbilicus with a small amount of yellow drainage. Nontender and distended.   Musculoskeletal: Symmetrical with no gross deformities. Extremities: 1+ BLE edema Neurological: Alert oriented x 4. No focal deficits.  Psychological: Alert and cooperative. 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  She was recently hospitalized for an incarcerated umbilical hernia and had this repaired surgically.  She has been recovering well from the surgery and is currently still undergoing wound care.  Her abdominal distention appears stable and she does have some bilateral lower extremity edema.  Will increase her spironolactone today and decrease her potassium supplements to try to optimize her fluid status.  Will recheck her labs today and plan to monitor her electrolytes in 2 weeks to ensure that she is tolerating the change in her diuretics okay.  She is adhering to a low-sodium diet.  She was noted during her recent hospitalization to have a large ovarian mass that will need to be removed due to concern for possible malignancy.  Based upon her VOCAL-Penn postoperative outcome score in the setting of cirrhosis, her 90-day risk of mortality after surgery is 12.6% and her 90-day risk of decompensation is 12.2%.  She has no signs of hepatic encephalopathy. Although she has not noted any SOB or gross evidence of GI bleeding, her Hb today has decreased. I will message her surgeon Dr. Tresa Endo to let him know of my assessment. -Check CBC, CMP and INR.  On her CBC, she was found to have significant anemia with hemoglobin of 7, decreased from 8.8 last month.  MCV suggests microcytic anemia, which may be due to iron deficiency.  Will try to get her into hematology clinic urgently, but if this is not possible then will start her on an oral iron supplement and recheck her CBC and ferritin/IBC  on 4/26.  Since patient has history of GAVE that was treated with APC, she may be at risk for recurrent bleeding due to GAVE.  Thus we will go ahead and increase her PPI to twice daily and save an EGD spot in the hospital for her next month. -Then recheck BMP in 2 weeks to monitor response to diuretics -Decrease potassium from 30 mEq to 20 mEq QD -Increase spironolactone 100 mg QD to 150 mg QD -Continue Lasix 80 mg QD -Cont low sodium diet <2 g -Continue wound care -She continues to follow with [facility] liver transplant for transplant consideration -Next RUQ U/S due in 10/2022 for [facility] screening -Will message her gynecologic  surgeon Dr. Festus Holts -Will restart on SBP prophylaxis with ciprofloxacin 500 mg QD -Consider starting on nadolol in the future for variceal prophylaxis -RTC in 2 months  Predicted Postoperative Outcomes by the VOCAL-Penn Score:     30-day mortality: 2.8%     90-day mortality: 12.6%     180-day mortality: 15.0%     90-day decompensation: 12.2%  I spent 51 minutes of time, including in depth chart review, independent review of results as outlined above, communicating results with the patient directly, face-to-face time with the patient, coordinating care, ordering studies and medications as appropriate, and documentation.

## 2022-04-26 NOTE — Patient Instructions (Addendum)
Your provider has requested that you go to the basement level for lab work before leaving today. Press "B" on the elevator. The lab is located at the first door on the left as you exit the elevator.   We have sent the following medications to your pharmacy for you to pick up at your convenience: Potassium, spironolactone    You are schedule fr follow up visit on 07/29/22 at 10:10 am  If your blood pressure at your visit was 140/90 or greater, please contact your primary care physician to follow up on this.  _______________________________________________________  If you are age 38 or older, your body mass index should be between 23-30. Your Body mass index is 47.39 kg/m. If this is out of the aforementioned range listed, please consider follow up with your Primary Care Provider.  If you are age 56 or younger, your body mass index should be between 19-25. Your Body mass index is 47.39 kg/m. If this is out of the aformentioned range listed, please consider follow up with your Primary Care Provider.   ________________________________________________________  The [facility] providers would like to encourage you to use [facility] to communicate with providers for non-urgent requests or questions.  Due to long hold times on the telephone, sending your provider a message by [facility] may be a faster and more efficient way to get a response.  Please allow 48 business hours for a response.  Please remember that this is for non-urgent requests.  _______________________________________________________   Due to recent changes in healthcare laws, you may see the results of your imaging and laboratory studies on MyChart before your provider has had a chance to review them.  We understand that in some cases there may be results that are confusing or concerning to you. Not all laboratory results come back in the same time frame and the provider may be waiting for multiple results in order to interpret others.   Please give [place] 48 hours in order for your provider to thoroughly review all the results before contacting the office for clarification of your results.    Thank you for entrusting me with your care and for choosing [facility], Dr. Eulah Pont

## 2022-04-26 NOTE — Telephone Encounter (Signed)
Critical Lab values  HGB 7.0 Hct 21.9

## 2022-04-26 NOTE — Telephone Encounter (Signed)
Spoke with patient. Advised of her labs. Patient denies any dark stools, no bloody stools, no sob , dizziness or breathlessness. She denies chest pain or discomfort. Denies weakness. Explained she should go to the closest ER if she develops any of these symptoms.  Patient confirms she is taking Cipro 500 mg daily. She agrees to increase her pantoprazole to BID. Iron supplement will be sent to [facility]. Patient hopes for coverage on this and if there is not, she will pay for the OTC supplement. Urgent referral placed to Hematology. Patient states she would not be able to come back to [facility] this week for labs or an urgent appointment. She states she can go to [facility] in [place]. Orders faxed to [facility] for CBC, IBC and Ferritin. Case for EGD on 05/19/22 booked.

## 2022-04-26 NOTE — Telephone Encounter (Signed)
Called the patient. No answer. Left her a message to call me today.

## 2022-04-26 NOTE — Telephone Encounter (Signed)
Attempted to call the patient but with no response. I left a voicemail letting her know that I had called about her labs. Carla Leonard, please continue to try to reach her to let her know that her blood counts have dropped. In clinic today, she told me that she had not seen any blood in the stools and that she is not having any SOB. However, I would just confirm this. Please place an urgent referral to hematology to see if they can see her in the next few days. If they cannot get her in hematology soon, then please request a repeat CBC and ferritin/IBC this Friday. Please increase her pantoprazole from 40 mg QD to BID. Please start her on ferrous sulfate 325 mg QD (she can start a stool softener such as docusate if she gets constipated). Please warn her that the iron supplement may turns her stools black. 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 If she is agreeable, we can schedule her in on 5/16 and cancel it later on if there is another reason for her anemia. If she develops shortness of breath, lightheadedness, or weakness, then she should go straight to the ED.

## 2022-04-28 ENCOUNTER — Other Ambulatory Visit: Payer: Self-pay | Admitting: Internal Medicine

## 2022-04-28 ENCOUNTER — Other Ambulatory Visit: Payer: Self-pay

## 2022-04-28 DIAGNOSIS — D509 Iron deficiency anemia, unspecified: Secondary | ICD-10-CM

## 2022-04-28 NOTE — Telephone Encounter (Signed)
Patient is returning your call.  

## 2022-04-28 NOTE — Telephone Encounter (Signed)
Inbound from [facility] stating they still have not received labs from patient and patient is in the office. Please advise.

## 2022-04-28 NOTE — Telephone Encounter (Signed)
Called the patient. No answer. I did not leave a voicemail this time.

## 2022-04-28 NOTE — Telephone Encounter (Signed)
Called the phone number listed on the website. No answer. Called the patient. No answer. Left the patient a message that I have faxed the orders for her labs again to the phone number [phone].

## 2022-04-29 ENCOUNTER — Telehealth: Payer: Self-pay | Admitting: Internal Medicine

## 2022-04-29 ENCOUNTER — Ambulatory Visit: Payer: Medicare HMO | Admitting: Internal Medicine

## 2022-04-29 LAB — CBC WITH DIFFERENTIAL/PLATELET
Basophils Absolute: 0 10*3/uL (ref 0.0–0.2)
Basos: 0 %
EOS (ABSOLUTE): 0.1 10*3/uL (ref 0.0–0.4)
Eos: 1 %
Hematocrit: 21.7 % — ABNORMAL LOW (ref 34.0–46.6)
Hemoglobin: 6.5 g/dL — CL (ref 11.1–15.9)
Immature Grans (Abs): 0 10*3/uL (ref 0.0–0.1)
Immature Granulocytes: 1 %
Lymphocytes Absolute: 0.5 10*3/uL — ABNORMAL LOW (ref 0.7–3.1)
Lymphs: 9 %
MCH: 22.8 pg — ABNORMAL LOW (ref 26.6–33.0)
MCHC: 30 g/dL — ABNORMAL LOW (ref 31.5–35.7)
MCV: 76 fL — ABNORMAL LOW (ref 79–97)
Monocytes Absolute: 0.5 10*3/uL (ref 0.1–0.9)
Monocytes: 8 %
Neutrophils Absolute: 5.1 10*3/uL (ref 1.4–7.0)
Neutrophils: 81 %
Platelets: 204 10*3/uL (ref 150–450)
RBC: 2.85 x10E6/uL — ABNORMAL LOW (ref 3.77–5.28)
RDW: 16.7 % — ABNORMAL HIGH (ref 11.7–15.4)
WBC: 6.3 10*3/uL (ref 3.4–10.8)

## 2022-04-29 LAB — BASIC METABOLIC PANEL
BUN/Creatinine Ratio: 16 (ref 12–28)
BUN: 23 mg/dL (ref 8–27)
CO2: 19 mmol/L — ABNORMAL LOW (ref 20–29)
Calcium: 8.8 mg/dL (ref 8.7–10.3)
Chloride: 97 mmol/L (ref 96–106)
Creatinine, Ser: 1.47 mg/dL — ABNORMAL HIGH (ref 0.57–1.00)
Glucose: 149 mg/dL — ABNORMAL HIGH (ref 70–99)
Potassium: 4.9 mmol/L (ref 3.5–5.2)
Sodium: 134 mmol/L (ref 134–144)
eGFR: 39 mL/min/{1.73_m2} — ABNORMAL LOW (ref >59)

## 2022-04-29 LAB — IRON AND TIBC
Iron Saturation: 5 % — CL (ref 15–55)
Iron: 16 ug/dL — ABNORMAL LOW (ref 27–139)
Total Iron Binding Capacity: 291 ug/dL (ref 250–450)
UIBC: 275 ug/dL (ref 118–369)

## 2022-04-29 LAB — FERRITIN: Ferritin: 44 ng/mL (ref 15–150)

## 2022-04-29 NOTE — Telephone Encounter (Signed)
Tried to contact the patient. No answer.  Dr Leonides Schanz has tried to call the patient as well. No answer. Goes to [facility]. Patient needs to speak with nurse. Patient is to be advised to go to the ED per Dr Leonides Schanz.  Hgb 6.5

## 2022-04-29 NOTE — Telephone Encounter (Signed)
Critical Alert Iron saturation 5 Hgb 6.5 Labs were drawn 04/28/22

## 2022-04-29 NOTE — Telephone Encounter (Signed)
Inbound call from [facility] regarding patients blood work results. Call back number is [phone] option 1 and the reference number is [id number]

## 2022-04-29 NOTE — Progress Notes (Signed)
Attempted to call the patient to let her know that her blood counts have dropped further. Phone call went straight to voicemail. I left her a voicemail letting her know that I had called.   Beth, please continue efforts to contact the patient to let her know that her anemia has worsened and she needs to go to the ED to be evaluated and to get a blood transfusion.

## 2022-05-02 ENCOUNTER — Telehealth: Payer: Self-pay | Admitting: Internal Medicine

## 2022-05-02 ENCOUNTER — Other Ambulatory Visit: Payer: Self-pay

## 2022-05-02 DIAGNOSIS — D509 Iron deficiency anemia, unspecified: Secondary | ICD-10-CM

## 2022-05-02 DIAGNOSIS — K31819 Angiodysplasia of stomach and duodenum without bleeding: Secondary | ICD-10-CM

## 2022-05-02 NOTE — Telephone Encounter (Signed)
PT is calling to find out what are the next steps to take about the bleed she has and she also wants to know if the information about her lab work to her oncologist, Dr. Tresa Endo or does she need to provide it to him. Please advise.

## 2022-05-02 NOTE — Telephone Encounter (Signed)
Spoke with the patient. She received 1 unit of blood at [facility]. Copy of the labs that triggered the need for this ER visit faxed to her GYN oncologist per patient request.

## 2022-05-10 ENCOUNTER — Telehealth: Payer: Self-pay | Admitting: Internal Medicine

## 2022-05-10 NOTE — Telephone Encounter (Signed)
Spoke with the patient This is the EGD that was planned due to the concerns for upper GI bleed.  Patient cannot find a ride or a care partner for that day. She is tearful and reports feeling overwhelmed. She will see her surgeon tomorrow. 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 She states understanding of the importance for the EGD, "but I don't have nobody to take me." Do you want me to move the case to June 27th at [facility]?

## 2022-05-10 NOTE — Telephone Encounter (Signed)
Patient called stating she received a letter for a endoscopy on 05/16. States she was not aware that this was scheduled. Requesting a call back before 1:20 or after 4 to discuss this. States he voicemail is not set up/ Please advise, thank you.

## 2022-05-12 ENCOUNTER — Encounter (HOSPITAL_COMMUNITY): Payer: Self-pay | Admitting: Internal Medicine

## 2022-05-12 NOTE — Progress Notes (Signed)
Attempted to obtain medical history via telephone, unable to reach at this time. HIPAA compliant voicemail message left requesting return call to pre surgical testing department. 

## 2022-05-13 NOTE — Telephone Encounter (Signed)
Inbound call from patient requesting a call back regarding EGD and transportation. Please advise, thank you.

## 2022-05-13 NOTE — Telephone Encounter (Signed)
Spoke with the patient. She was unsuccessful in finding a care partner for 05/19/22 at [facility]. Case moved to 06/30/22 at [facility] with an 11:15 am start time.

## 2022-05-13 NOTE — Telephone Encounter (Signed)
Called the patient. No answer.  °

## 2022-05-16 ENCOUNTER — Other Ambulatory Visit: Payer: Self-pay

## 2022-05-16 DIAGNOSIS — K31819 Angiodysplasia of stomach and duodenum without bleeding: Secondary | ICD-10-CM

## 2022-05-16 NOTE — Telephone Encounter (Signed)
Called the patient to advise. No answer. Will mail instructions.

## 2022-05-16 NOTE — Telephone Encounter (Signed)
Patient notified. Instructions mailed.

## 2022-05-19 ENCOUNTER — Other Ambulatory Visit: Payer: Self-pay

## 2022-05-19 ENCOUNTER — Telehealth: Payer: Self-pay | Admitting: Internal Medicine

## 2022-05-19 MED ORDER — PANTOPRAZOLE SODIUM 40 MG PO TBEC: 40.0000 mg | DELAYED_RELEASE_TABLET | Freq: Two times a day (BID) | ORAL | 3 refills | Status: DC

## 2022-05-19 NOTE — Telephone Encounter (Signed)
PT is calling to find out the correct quantitiy amount she is to take for pantoprazole. The pharmacy explained she should only be taking 1 but it say 2 a day.Please advise.

## 2022-05-19 NOTE — Telephone Encounter (Signed)
Previously she was on pantoprazole daily and it was increased to BID on 04/26/22.  I called the patient and advised she is correct. New Rx [facility] to [facility] in [place], [place].

## 2022-05-20 NOTE — Telephone Encounter (Signed)
Called the patient on [phone] and on [phone] . No answer. If she calls back and I am unavailable, please tell her [facility] says [facility]razole 06/14/22. I gave them a new prescription with 2 times a day dosing.  When and from whom did she get the last prescription? I can call them.

## 2022-05-20 NOTE — Telephone Encounter (Signed)
Patient is calling states the pharmacy explained she should be taking 1 Pantoprazole a day but she's been taking 2. Note below was an error she is requesting a call from the nurse states she was not able to speak with one yesterday says she missed the call. Please advise

## 2022-05-24 ENCOUNTER — Telehealth: Payer: Self-pay

## 2022-05-24 NOTE — Telephone Encounter (Signed)
Spoke to patient advise procedure has been moved from [facility] to [facility] . Patient verbalized understanding

## 2022-05-25 NOTE — Progress Notes (Signed)
Left message regarding changing location of this procedure [facility] to [facility] at the same time 11:15.

## 2022-06-18 ENCOUNTER — Other Ambulatory Visit: Payer: Self-pay | Admitting: Internal Medicine

## 2022-06-24 NOTE — Progress Notes (Signed)
Talked with patient regarding appointment on 6/27 and confirmed that it was at [facility] and she would be here at 0930. Will be NPO after MN other than medicine in the morning with a sip of water. Confirmed that patient would take keppra in am and Midodrine for blood pressure support. Questions answered.

## 2022-06-29 NOTE — Anesthesia Preprocedure Evaluation (Signed)
Anesthesia Evaluation  Patient identified by MRN, date of birth, ID band Patient awake    Reviewed: Allergy & Precautions, NPO status , Patient's Chart, lab work & pertinent test results  Airway Mallampati: I  TM Distance: >3 FB Neck ROM: Full    Dental no notable dental hx. (+) Edentulous Upper, Edentulous Lower   Pulmonary former smoker   Pulmonary exam normal breath sounds clear to auscultation       Cardiovascular hypertension, + DVT  Normal cardiovascular exam Rhythm:Regular Rate:Normal     Neuro/Psych Seizures -,   Anxiety Depression       GI/Hepatic ,,,(+) Cirrhosis       GAVE- Gastric antral vascular ectasia   Endo/Other    Renal/GU  Bladder dysfunction: Sulfa, prednisone,.      Musculoskeletal  (+) Arthritis ,    Abdominal   Peds  Hematology  (+) Blood dyscrasia, anemia   Anesthesia Other Findings All: Duloxetine, NSAIDs, Sulfa, Prednisone  Reproductive/Obstetrics                             Anesthesia Physical Anesthesia Plan  ASA: 3  Anesthesia Plan: MAC   Post-op Pain Management:    Induction: Intravenous  PONV Risk Score and Plan: Treatment may vary due to age or medical condition and Propofol infusion  Airway Management Planned:   Additional Equipment: None  Intra-op Plan:   Post-operative Plan:   Informed Consent: I have reviewed the patients History and Physical, chart, labs and discussed the procedure including the risks, benefits and alternatives for the proposed anesthesia with the patient or authorized representative who has indicated his/her understanding and acceptance.     Dental advisory given  Plan Discussed with: CRNA and Anesthesiologist  Anesthesia Plan Comments:        Anesthesia Quick Evaluation

## 2022-06-30 ENCOUNTER — Ambulatory Visit (HOSPITAL_BASED_OUTPATIENT_CLINIC_OR_DEPARTMENT_OTHER): Payer: Medicare HMO | Admitting: Certified Registered Nurse Anesthetist

## 2022-06-30 ENCOUNTER — Encounter (HOSPITAL_COMMUNITY)
Admission: RE | Disposition: A | Discharge status: Home / Self Care | Payer: Self-pay | Source: Home / Self Care | Attending: Internal Medicine

## 2022-06-30 ENCOUNTER — Other Ambulatory Visit: Payer: Self-pay

## 2022-06-30 ENCOUNTER — Encounter (HOSPITAL_COMMUNITY): Payer: Self-pay | Admitting: Internal Medicine

## 2022-06-30 ENCOUNTER — Ambulatory Visit (HOSPITAL_COMMUNITY): Payer: Medicare HMO | Admitting: Certified Registered Nurse Anesthetist

## 2022-06-30 ENCOUNTER — Ambulatory Visit (HOSPITAL_COMMUNITY)
Admission: RE | Admit: 2022-06-30 | Discharge: 2022-06-30 | Disposition: A | Discharge status: Home / Self Care | Payer: Medicare HMO | Attending: Internal Medicine | Admitting: Internal Medicine

## 2022-06-30 DIAGNOSIS — F418 Other specified anxiety disorders: Secondary | ICD-10-CM

## 2022-06-30 DIAGNOSIS — K766 Portal hypertension: Secondary | ICD-10-CM

## 2022-06-30 DIAGNOSIS — F32A Depression, unspecified: Secondary | ICD-10-CM | POA: Diagnosis not present

## 2022-06-30 DIAGNOSIS — I8500 Esophageal varices without bleeding: Secondary | ICD-10-CM

## 2022-06-30 DIAGNOSIS — R569 Unspecified convulsions: Secondary | ICD-10-CM | POA: Diagnosis not present

## 2022-06-30 DIAGNOSIS — F419 Anxiety disorder, unspecified: Secondary | ICD-10-CM | POA: Diagnosis not present

## 2022-06-30 DIAGNOSIS — I8510 Secondary esophageal varices without bleeding: Secondary | ICD-10-CM | POA: Diagnosis not present

## 2022-06-30 DIAGNOSIS — M1990 Unspecified osteoarthritis, unspecified site: Secondary | ICD-10-CM | POA: Diagnosis not present

## 2022-06-30 DIAGNOSIS — K3189 Other diseases of stomach and duodenum: Secondary | ICD-10-CM | POA: Diagnosis not present

## 2022-06-30 DIAGNOSIS — D509 Iron deficiency anemia, unspecified: Secondary | ICD-10-CM | POA: Diagnosis not present

## 2022-06-30 DIAGNOSIS — Z87891 Personal history of nicotine dependence: Secondary | ICD-10-CM | POA: Insufficient documentation

## 2022-06-30 DIAGNOSIS — I10 Essential (primary) hypertension: Secondary | ICD-10-CM | POA: Diagnosis not present

## 2022-06-30 DIAGNOSIS — K31819 Angiodysplasia of stomach and duodenum without bleeding: Secondary | ICD-10-CM

## 2022-06-30 DIAGNOSIS — K317 Polyp of stomach and duodenum: Secondary | ICD-10-CM

## 2022-06-30 DIAGNOSIS — K7460 Unspecified cirrhosis of liver: Secondary | ICD-10-CM | POA: Diagnosis not present

## 2022-06-30 DIAGNOSIS — Z86718 Personal history of other venous thrombosis and embolism: Secondary | ICD-10-CM | POA: Diagnosis not present

## 2022-06-30 DIAGNOSIS — D649 Anemia, unspecified: Secondary | ICD-10-CM

## 2022-06-30 HISTORY — PX: HEMOSTASIS CLIP PLACEMENT: SHX6857

## 2022-06-30 HISTORY — PX: POLYPECTOMY: SHX5525

## 2022-06-30 HISTORY — PX: ESOPHAGOGASTRODUODENOSCOPY (EGD) WITH PROPOFOL: SHX5813

## 2022-06-30 LAB — POCT I-STAT, CHEM 8
BUN: 23 mg/dL (ref 8–23)
Calcium, Ion: 1.18 mmol/L (ref 1.15–1.40)
Chloride: 98 mmol/L (ref 98–111)
Creatinine, Ser: 1.4 mg/dL — ABNORMAL HIGH (ref 0.44–1.00)
Glucose, Bld: 113 mg/dL — ABNORMAL HIGH (ref 70–99)
HCT: 24 % — ABNORMAL LOW (ref 36.0–46.0)
Hemoglobin: 8.2 g/dL — ABNORMAL LOW (ref 12.0–15.0)
Potassium: 4.1 mmol/L (ref 3.5–5.1)
Sodium: 135 mmol/L (ref 135–145)
TCO2: 27 mmol/L (ref 22–32)

## 2022-06-30 SURGERY — ESOPHAGOGASTRODUODENOSCOPY (EGD) WITH PROPOFOL
Anesthesia: Monitor Anesthesia Care

## 2022-06-30 MED ORDER — PROPOFOL 10 MG/ML IV BOLUS
INTRAVENOUS | Status: DC | PRN
  Administered 2022-06-30 (×2): 30 mg via INTRAVENOUS

## 2022-06-30 MED ORDER — PROPOFOL 500 MG/50ML IV EMUL
INTRAVENOUS | Status: DC | PRN
  Administered 2022-06-30: 125 ug/kg/min via INTRAVENOUS

## 2022-06-30 MED ORDER — FENTANYL CITRATE (PF) 100 MCG/2ML IJ SOLN
INTRAMUSCULAR | Status: AC
  Filled 2022-06-30: qty 2

## 2022-06-30 MED ORDER — FENTANYL CITRATE (PF) 100 MCG/2ML IJ SOLN
INTRAMUSCULAR | Status: DC | PRN
  Administered 2022-06-30: 100 ug via INTRAVENOUS

## 2022-06-30 MED ORDER — PHENYLEPHRINE 80 MCG/ML (10ML) SYRINGE FOR IV PUSH (FOR BLOOD PRESSURE SUPPORT)
PREFILLED_SYRINGE | INTRAVENOUS | Status: DC | PRN
  Administered 2022-06-30 (×3): 120 ug via INTRAVENOUS

## 2022-06-30 MED ORDER — LACTATED RINGERS IV SOLN
INTRAVENOUS | Status: AC | PRN
  Administered 2022-06-30: 1000 mL via INTRAVENOUS

## 2022-06-30 SURGICAL SUPPLY — 15 items

## 2022-06-30 NOTE — Discharge Instructions (Signed)

## 2022-06-30 NOTE — H&P (Signed)
GASTROENTEROLOGY PROCEDURE H&P NOTE   Primary Care Physician: Hildred Priest, FNP    Reason for Procedure:   Anemia, history of GAVE  Plan:    EGD  Patient is appropriate for endoscopic procedure(s) in the hospital setting.  The nature of the procedure, as well as the risks, benefits, and alternatives were carefully and thoroughly reviewed with the patient. Ample time for discussion and questions allowed. The patient understood, was satisfied, and agreed to proceed.     HPI: Carla Leonard is a 67 y.o. female who presents for EGD for anemia and history of GAVE  Past Medical History:  Diagnosis Date   Allergy    "spring time",no medications   Anxiety    Arthritis    back,knee,hands   Asthma    "as a child"   Depression    History of kidney stones    Hypertension    pt.denies updated 02/07/22   Insomnia    Seizures (HCC)    last 2 1/2 years ago    Past Surgical History:  Procedure Laterality Date   APPENDECTOMY     BIOPSY  08/09/2021   Procedure: BIOPSY;  Surgeon: Lemar Lofty., MD;  Location: [facility];  Service: Gastroenterology;;   CHOLECYSTECTOMY     COLONOSCOPY WITH PROPOFOL N/A 02/24/2022   Procedure: COLONOSCOPY WITH PROPOFOL;  Surgeon: Imogene Burn, MD;  Location: [facility];  Service: Gastroenterology;  Laterality: N/A;   ELBOW SURGERY     RIGHT   ESOPHAGOGASTRODUODENOSCOPY N/A 08/09/2021   Procedure: ESOPHAGOGASTRODUODENOSCOPY (EGD);  Surgeon: Lemar Lofty., MD;  Location: [facility];  Service: Gastroenterology;  Laterality: N/A;   EUS N/A 08/09/2021   Procedure: UPPER ENDOSCOPIC ULTRASOUND (EUS) LINEAR ;  Surgeon: Lemar Lofty., MD;  Location: [facility];  Service: Gastroenterology;  Laterality: N/A;   HEMOSTASIS CLIP PLACEMENT  08/09/2021   Procedure: HEMOSTASIS CLIP PLACEMENT;  Surgeon: Lemar Lofty., MD;  Location: [facility];  Service: Gastroenterology;;   HOT HEMOSTASIS N/A 08/09/2021   Procedure:  HOT HEMOSTASIS (ARGON PLASMA COAGULATION/BICAP);  Surgeon: Lemar Lofty., MD;  Location: [facility];  Service: Gastroenterology;  Laterality: N/A;   IR PARACENTESIS  04/28/2021   IR PARACENTESIS  05/06/2021   IR PARACENTESIS  05/11/2021   JOINT REPLACEMENT     right TKA 06/2010   KNEE ARTHROPLASTY  06/17/2011   Procedure: COMPUTER ASSISTED TOTAL KNEE ARTHROPLASTY;  Surgeon: Harvie Junior, MD;  Location: [facility] OR;  Service: Orthopedics;  Laterality: Left;  TOTAL KNEE REPLACEMENT WITH GENERAL ANESTHESIA AND PRE OP FEMORAL NERVE BLOCK   PERIANAL CYST     POLYPECTOMY  08/09/2021   Procedure: POLYPECTOMY;  Surgeon: Meridee Score Netty Starring., MD;  Location: [facility];  Service: Gastroenterology;;   POLYPECTOMY  02/24/2022   Procedure: POLYPECTOMY;  Surgeon: Imogene Burn, MD;  Location: [facility];  Service: Gastroenterology;;   SUBMUCOSAL TATTOO INJECTION  08/09/2021   Procedure: SUBMUCOSAL TATTOO INJECTION;  Surgeon: Lemar Lofty., MD;  Location: [facility];  Service: Gastroenterology;;    Prior to Admission medications   Medication Sig Start Date End Date Taking? Authorizing Provider  albuterol (VENTOLIN HFA) 108 (90 Base) MCG/ACT inhaler Inhale 2 puffs into the lungs every 6 (six) hours as needed for wheezing or shortness of breath.   Yes [provider]  ALPRAZolam Prudy Feeler) 0.5 MG tablet Take 1 tablet by mouth 2 times a day as needed Patient taking differently: Take 0.5 mg by mouth every 6 (six) hours. 03/05/21  Yes  b complex vitamins capsule Take 2 capsules by mouth daily.   Yes [provider]  Cholecalciferol (VITAMIN D3) 1000 units CAPS Take 100 Units by mouth daily.   Yes [provider]  ciprofloxacin (CIPRO) 500 MG tablet TAKE 1 TABLET EVERY DAY 03/06/22  Yes Imogene Burn, MD  Ferrous Sulfate (IRON PO) Take 65 mg by mouth daily.   Yes [provider]  folic acid (FOLVITE) 1 MG tablet Take 1 tablet (1 mg total) by mouth daily. 05/12/21   Yes Burnadette Pop, MD  furosemide (LASIX) 20 MG tablet Take 3 tablets (60 mg total) by mouth daily. Patient taking differently: Take 20 mg by mouth daily as needed for fluid or edema. 05/18/21  Yes Imogene Burn, MD  hydrocortisone (CORTEF) 10 MG tablet Take 2 tablets (20 mg total) by mouth daily.  Do not stop it abruptly unless told by the endocrinologist. 04/12/21  Yes Leroy Sea, MD  levETIRAcetam (KEPPRA) 500 MG tablet Take 1 tablet by mouth 2 times a day 08/21/20  Yes   loperamide (IMODIUM) 2 MG capsule Take 1 capsule (2 mg total) by mouth as needed for diarrhea or loose stools. 05/11/21  Yes Burnadette Pop, MD  midodrine (PROAMATINE) 10 MG tablet Take 1 tablet (10 mg total) by mouth 3 (three) times daily with meals. 04/12/21  Yes Leroy Sea, MD  oxyCODONE (OXY IR/ROXICODONE) 5 MG immediate release tablet Take 5 mg by mouth every 6 (six) hours. 03/09/22  Yes [provider]  pantoprazole (PROTONIX) 40 MG tablet Take 1 tablet (40 mg total) by mouth 2 (two) times daily before a meal. 05/19/22  Yes Imogene Burn, MD  potassium chloride SA (KLOR-CON M) 20 MEQ tablet TAKE 1 TABLET TWICE DAILY Patient taking differently: Take 20 mEq by mouth daily. 10 meq tab 06/20/22  Yes Imogene Burn, MD  primidone (MYSOLINE) 50 MG tablet Take 100 mg by mouth every morning. 01/21/21  Yes [provider]  promethazine (PHENERGAN) 12.5 MG tablet Take 1 tablet (12.5 mg total) by mouth every 6 (six) hours as needed for nausea or vomiting. 08/06/21  Yes Imogene Burn, MD  spironolactone (ALDACTONE) 50 MG tablet Take 3 tablets (150 mg total) by mouth 3 (three) times daily. Patient taking differently: Take 50 mg by mouth 2 (two) times daily. 04/26/22 06/24/22 Yes Imogene Burn, MD  sucralfate (CARAFATE) 1 GM/10ML suspension TAKE 10 MLS (1 G TOTAL) BY MOUTH 2 (TWO) TIMES DAILY. 11/11/21 06/24/22 Yes Mansouraty, Netty Starring., MD  thiamine 100 MG tablet Take 1 tablet (100 mg total) by mouth  daily. Patient not taking: Reported on 06/24/2022 05/12/21   Burnadette Pop, MD    Current Facility-Administered Medications  Medication Dose Route Frequency Provider Last Rate Last Admin   lactated ringers infusion    Continuous PRN Imogene Burn, MD 10 mL/hr at 06/30/22 1135 Continued from Pre-op at 06/30/22 1135    Allergies as of 04/26/2022 - Review Complete 04/26/2022  Allergen Reaction Noted   Sulfa antibiotics Anaphylaxis 04/27/2011   Prednisone Other (See Comments) 05/12/2015   Nsaids Other (See Comments) 04/27/2011   Other Other (See Comments) 05/27/2015    Family History  Problem Relation Age of Onset   Pneumonia Mother    Diabetes Father    Colon cancer Neg Hx    Esophageal cancer Neg Hx    Stomach cancer Neg Hx    Pancreatic cancer Neg Hx    Colon polyps Neg Hx  Crohn's disease Neg Hx    Rectal cancer Neg Hx    Ulcerative colitis Neg Hx     Social History   Socioeconomic History   Marital status: Divorced    Spouse name: Not on file   Number of children: 1   Years of education: Not on file   Highest education level: Not on file  Occupational History   Occupation: Retired Lawyer  Tobacco Use   Smoking status: Former    Packs/day: .25    Types: Cigarettes   Smokeless tobacco: Never   Tobacco comments:    Former 2 ppd  Building services engineer Use: Some days   Devices: Non Nicotine  Substance and Sexual Activity   Alcohol use: Not Currently    Comment: quit 1 year ago updated 02/07/22   Drug use: Not Currently    Types: Marijuana    Comment: quit 7 months ago updated 02/07/22   Sexual activity: Not on file  Other Topics Concern   Not on file  Social History Narrative   Not on file   Social Determinants of Health   Financial Resource Strain: Not on file  Food Insecurity: Not on file  Transportation Needs: Not on file  Physical Activity: Not on file  Stress: Not on file  Social Connections: Not on file  Intimate Partner Violence: Not on file     Physical Exam: Vital signs in last 24 hours: BP (!) 128/47   Pulse 88   Temp 97.9 F (36.6 C) (Oral)   Resp 20   Ht 5\' 1"  (1.549 m)   Wt 113.8 kg   SpO2 97%   BMI 47.40 kg/m  GEN: NAD EYE: Sclerae anicteric ENT: MMM CV: Non-tachycardic Pulm: No increased work of breathing GI: Soft, NT/ND NEURO:  Alert & Oriented   Eulah Pont, MD [facility]  06/30/2022 11:39 AM

## 2022-06-30 NOTE — Anesthesia Procedure Notes (Signed)
Procedure Name: MAC Date/Time: 06/30/2022 11:37 AM  Performed by: Nelle Don, CRNAPre-anesthesia Checklist: Patient identified, Emergency Drugs available, Suction available and Patient being monitored Oxygen Delivery Method: Simple face mask

## 2022-06-30 NOTE — Anesthesia Postprocedure Evaluation (Signed)
Anesthesia Post Note  Patient: Carla Leonard  Procedure(s) Performed: ESOPHAGOGASTRODUODENOSCOPY (EGD) WITH PROPOFOL POLYPECTOMY HOT HEMOSTASIS (ARGON PLASMA COAGULATION/BICAP)     Patient location during evaluation: Endoscopy Anesthesia Type: MAC Level of consciousness: awake and alert Pain management: pain level controlled Vital Signs Assessment: post-procedure vital signs reviewed and stable Respiratory status: spontaneous breathing, nonlabored ventilation, respiratory function stable and patient connected to nasal cannula oxygen Cardiovascular status: blood pressure returned to baseline and stable Postop Assessment: no apparent nausea or vomiting Anesthetic complications: no   No notable events documented.  Last Vitals:  Vitals:   06/30/22 1237 06/30/22 1247  BP: (!) 112/52 (!) 102/54  Pulse: 92 88  Resp: 15 13  Temp:    SpO2: 98% 99%    Last Pain:  Vitals:   06/30/22 1247  TempSrc:   PainSc: 10-Worst pain ever                 Trevor Iha

## 2022-06-30 NOTE — Op Note (Addendum)
[facility] Patient Name: Carla Leonard Procedure Date: 06/30/2022 MRN: [id number] Attending MD: Particia Lather , , [id number] Date of Birth: [birth year] CSN: [id number] Age: 67 Admit Type: Outpatient Procedure:                Upper GI endoscopy Indications:              Iron deficiency anemia Providers:                Madelyn Brunner" Jesus Genera, RN, Adin Hector, RN, Kandice Robinsons, Technician Referring MD:             Ester Rink, FNP Medicines:                Monitored Anesthesia Care Complications:            No immediate complications. Estimated Blood Loss:     Estimated blood loss was minimal. Procedure:                Pre-Anesthesia Assessment:                           - Prior to the procedure, a History and Physical                            was performed, and patient medications and                            allergies were reviewed. The patient's tolerance of                            previous anesthesia was also reviewed. The risks                            and benefits of the procedure and the sedation                            options and risks were discussed with the patient.                            All questions were answered, and informed consent                            was obtained. Prior Anticoagulants: The patient has                            taken no anticoagulant or antiplatelet agents. ASA                            Grade Assessment: III - A patient with severe                            systemic disease. After reviewing the risks and  benefits, the patient was deemed in satisfactory                            condition to undergo the procedure.                           After obtaining informed consent, the endoscope was                            passed under direct vision. Throughout the                            procedure, the patient's blood pressure,  pulse, and                            oxygen saturations were monitored continuously. The                            GIF-H190 ([id number]) Olympus endoscope was introduced                            through the mouth, and advanced to the second part                            of duodenum. The upper GI endoscopy was                            accomplished without difficulty. The patient                            tolerated the procedure well. 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      Portal hypertensive gastropathy was found in the gastric body.      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 Focal radiofrequency ablation of gastric antral vascular       ectasia in the gastric antrum was performed. With the endoscope in       place, the position and extent of the abnormal mucosa and appropriate       anatomic landmarks were noted. The radiofrequency channel ablation       catheter was introduced through the endoscope working channel. The       endoscope with the ablation catheter was advanced to the areas of       abnormal mucosa. The endoscope with the channel ablation catheter was       positioned under direct visualization so that the catheter was placed in       contact with the surface of the abnormal mucosa. Energy was applied       twice at 12 J/cm2. Ablation was repeated in a likewise fashion to all       visible abnormal mucosa. The ablation catheter was removed through the       endoscope working channel. The areas where abnormal mucosa had been       ablated were examined.      One 5 mm  sessile polyp with no bleeding was found in the duodenal bulb.       The polyp was removed with a hot snare. Resection and retrieval were       complete. To prevent bleeding after the polypectomy, one hemostatic clip       was successfully placed. There was no bleeding at the end of the        procedure.      A tattoo was seen in the second portion of the duodenum. Impression:               - Grade II esophageal varices.                           - Portal hypertensive gastropathy.                           - Gastric antral vascular ectasia without bleeding.                            Treated with radiofrequency ablation.                           - One duodenal polyp. Resected and retrieved. Clip                            was placed.                           - A tattoo was seen in the duodenum. Moderate Sedation:      Not Applicable - Patient had care per Anesthesia. Recommendation:           - Discharge patient to home (with escort).                           - Conitnue PPI BID and iron supplementation.                           - Await pathology results.                           - Consider non-selective beta blocker in the future                            for variceal prophylaxis. Did not start today                            because patient is holding her diuretics because                            she recently had a syncopal episode.                           - Can consider further treatment of GAVE in the                            future.                           -  Follow in GI clinic as scheduled.                           - The findings and recommendations were discussed                            with the patient. Procedure Code(s):        --- Professional ---                           [phone], 59, Esophagogastroduodenoscopy, flexible,                            transoral; with control of bleeding, any method                           43251, Esophagogastroduodenoscopy, flexible,                            transoral; with removal of tumor(s), polyp(s), or                            other lesion(s) by snare technique Diagnosis Code(s):        --- Professional ---                           I85.00, Esophageal varices without bleeding                            K76.6, Portal hypertension                           K31.89, Other diseases of stomach and duodenum                           K31.819, Angiodysplasia of stomach and duodenum                            without bleeding                           K31.7, Polyp of stomach and duodenum                           D50.9, Iron deficiency anemia, unspecified CPT copyright 2022 [employer]. All rights reserved. The codes documented in this report are preliminary and upon coder review may  be revised to meet current compliance requirements. Dr Particia Lather "Alan Ripper" Leonides Schanz,  06/30/2022 12:34:40 PM Number of Addenda: 0

## 2022-06-30 NOTE — Transfer of Care (Signed)
Immediate Anesthesia Transfer of Care Note  Patient: Carla Leonard  Procedure(s) Performed: ESOPHAGOGASTRODUODENOSCOPY (EGD) WITH PROPOFOL POLYPECTOMY HOT HEMOSTASIS (ARGON PLASMA COAGULATION/BICAP)  Patient Location: Endoscopy Unit  Anesthesia Type:MAC  Level of Consciousness: awake and patient cooperative  Airway & Oxygen Therapy: Patient Spontanous Breathing and Patient connected to face mask  Post-op Assessment: Report given to RN and Post -op Vital signs reviewed and stable  Post vital signs: Reviewed and stable  Last Vitals:  Vitals Value Taken Time  BP    Temp    Pulse    Resp    SpO2      Last Pain:  Vitals:   06/30/22 0923  TempSrc: Oral  PainSc: 6          Complications: No notable events documented.

## 2022-07-03 ENCOUNTER — Encounter (HOSPITAL_COMMUNITY): Payer: Self-pay | Admitting: Internal Medicine

## 2022-07-08 LAB — SURGICAL PATHOLOGY

## 2022-07-11 ENCOUNTER — Encounter: Payer: Self-pay | Admitting: Internal Medicine

## 2022-07-21 ENCOUNTER — Ambulatory Visit: Payer: Medicare HMO | Admitting: Neurology

## 2022-07-29 ENCOUNTER — Ambulatory Visit: Payer: Medicare HMO | Admitting: Internal Medicine

## 2022-11-08 ENCOUNTER — Ambulatory Visit: Payer: Medicare HMO | Admitting: Internal Medicine

## 2022-11-10 ENCOUNTER — Other Ambulatory Visit: Payer: Self-pay | Admitting: Orthopedic Surgery

## 2022-11-10 DIAGNOSIS — M545 Low back pain, unspecified: Secondary | ICD-10-CM

## 2022-11-29 ENCOUNTER — Other Ambulatory Visit: Payer: 59

## 2022-12-09 ENCOUNTER — Ambulatory Visit
Admission: RE | Admit: 2022-12-09 | Discharge: 2022-12-09 | Disposition: A | Discharge status: Home / Self Care | Payer: 59 | Source: Ambulatory Visit | Attending: Orthopedic Surgery | Admitting: Orthopedic Surgery

## 2022-12-09 DIAGNOSIS — M545 Low back pain, unspecified: Secondary | ICD-10-CM

## 2022-12-21 ENCOUNTER — Other Ambulatory Visit: Payer: 59

## 2023-01-08 DIAGNOSIS — I350 Nonrheumatic aortic (valve) stenosis: Secondary | ICD-10-CM | POA: Diagnosis not present

## 2023-01-18 ENCOUNTER — Encounter: Payer: Self-pay | Admitting: Internal Medicine

## 2023-01-27 ENCOUNTER — Ambulatory Visit: Payer: 59

## 2023-02-10 ENCOUNTER — Ambulatory Visit: Payer: 59 | Admitting: Gastroenterology

## 2023-02-17 ENCOUNTER — Ambulatory Visit: Payer: 59

## 2023-03-17 ENCOUNTER — Encounter (HOSPITAL_COMMUNITY): Payer: Self-pay

## 2023-03-25 ENCOUNTER — Other Ambulatory Visit: Payer: Self-pay

## 2023-03-25 ENCOUNTER — Encounter (HOSPITAL_COMMUNITY): Payer: Self-pay | Admitting: Emergency Medicine

## 2023-03-25 ENCOUNTER — Emergency Department (HOSPITAL_COMMUNITY)
Admission: EM | Admit: 2023-03-25 | Discharge: 2023-03-25 | Disposition: A | Discharge status: Home / Self Care | Attending: Emergency Medicine | Admitting: Emergency Medicine

## 2023-03-25 DIAGNOSIS — N390 Urinary tract infection, site not specified: Secondary | ICD-10-CM | POA: Diagnosis not present

## 2023-03-25 DIAGNOSIS — R319 Hematuria, unspecified: Secondary | ICD-10-CM | POA: Diagnosis present

## 2023-03-25 LAB — URINALYSIS, ROUTINE W REFLEX MICROSCOPIC
Bilirubin Urine: NEGATIVE
Glucose, UA: NEGATIVE mg/dL
Ketones, ur: NEGATIVE mg/dL
Nitrite: NEGATIVE
Protein, ur: 100 mg/dL — AB
Specific Gravity, Urine: 1.015 (ref 1.005–1.030)
pH: 6 (ref 5.0–8.0)

## 2023-03-25 LAB — URINALYSIS, MICROSCOPIC (REFLEX)
RBC / HPF: >50 RBC/hpf (ref 0–5)
WBC, UA: >50 WBC/hpf (ref 0–5)

## 2023-03-25 NOTE — ED Provider Notes (Signed)
 [facility] EMERGENCY DEPARTMENT AT [facility] Provider Note   CSN: [id number] Arrival date & time: 03/25/23  1741     History Chief Complaint  Patient presents with   Hematuria    HPI Carla Leonard is a 68 y.o. female presenting for hematuria.  States that she noticed discoloration in her catheter.  She has indwelling catheter for urinary retention.  Has follow-up with urology next week to have it removed.  She denies fevers chills nausea vomiting syncope shortness of breath.  She is ambulatory tolerating p.o. intake on arrival.  Otherwise no acute distress..   Patient's recorded medical, surgical, social, medication list and allergies were reviewed in the Snapshot window as part of the initial history.   Review of Systems   Review of Systems  Constitutional:  Negative for chills and fever.  HENT:  Negative for ear pain and sore throat.   Eyes:  Negative for pain and visual disturbance.  Respiratory:  Negative for cough and shortness of breath.   Cardiovascular:  Negative for chest pain and palpitations.  Gastrointestinal:  Negative for abdominal pain and vomiting.  Genitourinary:  Positive for hematuria. Negative for dysuria.  Musculoskeletal:  Negative for arthralgias and back pain.  Skin:  Negative for color change and rash.  Neurological:  Negative for seizures and syncope.  All other systems reviewed and are negative.   Physical Exam Updated Vital Signs BP 131/70   Pulse 76   Temp 98.4 F (36.9 C)   Resp 17   Ht 5\' 1"  (1.549 m)   Wt 110.2 kg   SpO2 100%   BMI 45.91 kg/m  Physical Exam Vitals and nursing note reviewed.  Constitutional:      General: She is not in acute distress.    Appearance: She is well-developed.  HENT:     Head: Normocephalic and atraumatic.  Eyes:     Conjunctiva/sclera: Conjunctivae normal.  Cardiovascular:     Rate and Rhythm: Normal rate and regular rhythm.     Heart sounds: No murmur heard. Pulmonary:     Effort:  Pulmonary effort is normal. No respiratory distress.     Breath sounds: Normal breath sounds.  Abdominal:     General: There is no distension.     Palpations: Abdomen is soft.     Tenderness: There is no abdominal tenderness. There is no right CVA tenderness or left CVA tenderness.  Musculoskeletal:        General: No swelling or tenderness. Normal range of motion.     Cervical back: Neck supple.  Skin:    General: Skin is warm and dry.  Neurological:     General: No focal deficit present.     Mental Status: She is alert and oriented to person, place, and time. Mental status is at baseline.     Cranial Nerves: No cranial nerve deficit.      ED Course/ Medical Decision Making/ A&P    Procedures Procedures   Medications Ordered in ED Medications - No data to display  Medical Decision Making:   Six 73-year-old female presenting for hematuria History of present illness physicals and findings most consistent with either urinary tract infection versus traumatic etiology from her Foley placement. Observed in ER for 2 and half hours.  All urine coming out now is clear.  Urine does appear likely colonized.  Discussed replacing her catheter for urine culture but patient declined.  States she is post have taken out early next week and would  rather just follow-up with her normal doctor to have the catheter removed. She started her ciprofloxacin and is not having any fevers or flank pain consistent with ascending infection. Patient was comfortable outpatient care and management this time given improvement of the hematuria.  Strict return precautions reinforced.  Clinical Impression:  1. Lower urinary tract infectious disease      Discharge   Final Clinical Impression(s) / ED Diagnoses Final diagnoses:  Lower urinary tract infectious disease    Rx / DC Orders ED Discharge Orders     None         Glyn Ade, MD 03/25/23 2019

## 2023-03-25 NOTE — ED Notes (Signed)
 Pt verbalized understanding of d/c instructions and follow up care. Pt reports her friend will be here to drive her home. Pt A&OX4, no acute distress.

## 2023-03-25 NOTE — Discharge Instructions (Addendum)
 Continue with your ciprofloxacin as prescribed.  Follow-up with your urologist as discussed.

## 2023-03-25 NOTE — ED Provider Triage Note (Signed)
 Emergency Medicine Provider Triage Evaluation Note  Carla Leonard , a 68 y.o. female  was evaluated in triage.  Pt complains of painless hematuria from her foley catheter. No trauma. No fever. Patient is not anticoagulated.  Review of Systems  Positive: Hematuria Negative: Fever, chills, back pain, abdominal pain.  Physical Exam  BP 131/70   Pulse 76   Temp 98.4 F (36.9 C)   Resp 17   Ht 5\' 1"  (1.549 m)   Wt 110.2 kg   SpO2 100%   BMI 45.91 kg/m  Gen:   Awake, no distress   Resp:  Normal effort  MSK:   Moves extremities without difficulty  Other:  Pale red urine in foley canister.   Medical Decision Making  Medically screening exam initiated at 6:02 PM.  Appropriate orders placed.  IEASHA BOEREMA was informed that the remainder of the evaluation will be completed by another provider, this initial triage assessment does not replace that evaluation, and the importance of remaining in the ED until their evaluation is complete.    Maia Plan, MD 03/25/23 [phone]

## 2023-03-25 NOTE — ED Triage Notes (Signed)
 Pt BIB [facility] from home for hematuria with foley in place that was placed 1 week ago. Noticed the blood this morning. Pt not on blood thinners.

## 2023-04-25 ENCOUNTER — Encounter (HOSPITAL_COMMUNITY): Payer: Self-pay

## 2023-04-25 ENCOUNTER — Inpatient Hospital Stay (HOSPITAL_COMMUNITY)
Admission: EM | Admit: 2023-04-25 | Discharge: 2023-05-02 | DRG: 432 | Disposition: A | Discharge status: Home / Self Care | Attending: Family Medicine | Admitting: Family Medicine

## 2023-04-25 ENCOUNTER — Emergency Department (HOSPITAL_COMMUNITY)

## 2023-04-25 ENCOUNTER — Inpatient Hospital Stay (HOSPITAL_COMMUNITY)

## 2023-04-25 ENCOUNTER — Other Ambulatory Visit: Payer: Self-pay

## 2023-04-25 DIAGNOSIS — K766 Portal hypertension: Secondary | ICD-10-CM | POA: Diagnosis present

## 2023-04-25 DIAGNOSIS — K7460 Unspecified cirrhosis of liver: Secondary | ICD-10-CM | POA: Diagnosis not present

## 2023-04-25 DIAGNOSIS — Z87892 Personal history of anaphylaxis: Secondary | ICD-10-CM

## 2023-04-25 DIAGNOSIS — Z87442 Personal history of urinary calculi: Secondary | ICD-10-CM

## 2023-04-25 DIAGNOSIS — K260 Acute duodenal ulcer with hemorrhage: Secondary | ICD-10-CM | POA: Diagnosis present

## 2023-04-25 DIAGNOSIS — F418 Other specified anxiety disorders: Secondary | ICD-10-CM | POA: Diagnosis not present

## 2023-04-25 DIAGNOSIS — G40909 Epilepsy, unspecified, not intractable, without status epilepticus: Secondary | ICD-10-CM | POA: Diagnosis present

## 2023-04-25 DIAGNOSIS — Z886 Allergy status to analgesic agent status: Secondary | ICD-10-CM

## 2023-04-25 DIAGNOSIS — Z79899 Other long term (current) drug therapy: Secondary | ICD-10-CM

## 2023-04-25 DIAGNOSIS — N1832 Chronic kidney disease, stage 3b: Secondary | ICD-10-CM | POA: Diagnosis present

## 2023-04-25 DIAGNOSIS — M7989 Other specified soft tissue disorders: Secondary | ICD-10-CM | POA: Diagnosis not present

## 2023-04-25 DIAGNOSIS — N179 Acute kidney failure, unspecified: Secondary | ICD-10-CM | POA: Diagnosis present

## 2023-04-25 DIAGNOSIS — R7989 Other specified abnormal findings of blood chemistry: Secondary | ICD-10-CM | POA: Diagnosis not present

## 2023-04-25 DIAGNOSIS — K250 Acute gastric ulcer with hemorrhage: Secondary | ICD-10-CM | POA: Diagnosis present

## 2023-04-25 DIAGNOSIS — R0602 Shortness of breath: Secondary | ICD-10-CM

## 2023-04-25 DIAGNOSIS — I824Y9 Acute embolism and thrombosis of unspecified deep veins of unspecified proximal lower extremity: Secondary | ICD-10-CM

## 2023-04-25 DIAGNOSIS — D696 Thrombocytopenia, unspecified: Secondary | ICD-10-CM | POA: Diagnosis present

## 2023-04-25 DIAGNOSIS — K7031 Alcoholic cirrhosis of liver with ascites: Principal | ICD-10-CM | POA: Diagnosis present

## 2023-04-25 DIAGNOSIS — I2489 Other forms of acute ischemic heart disease: Secondary | ICD-10-CM | POA: Diagnosis present

## 2023-04-25 DIAGNOSIS — I129 Hypertensive chronic kidney disease with stage 1 through stage 4 chronic kidney disease, or unspecified chronic kidney disease: Secondary | ICD-10-CM | POA: Diagnosis present

## 2023-04-25 DIAGNOSIS — Z6841 Body Mass Index (BMI) 40.0 and over, adult: Secondary | ICD-10-CM | POA: Diagnosis not present

## 2023-04-25 DIAGNOSIS — K269 Duodenal ulcer, unspecified as acute or chronic, without hemorrhage or perforation: Secondary | ICD-10-CM | POA: Diagnosis not present

## 2023-04-25 DIAGNOSIS — G2581 Restless legs syndrome: Secondary | ICD-10-CM | POA: Diagnosis present

## 2023-04-25 DIAGNOSIS — I272 Pulmonary hypertension, unspecified: Secondary | ICD-10-CM | POA: Diagnosis present

## 2023-04-25 DIAGNOSIS — Z860100 Personal history of colon polyps, unspecified: Secondary | ICD-10-CM

## 2023-04-25 DIAGNOSIS — I82411 Acute embolism and thrombosis of right femoral vein: Secondary | ICD-10-CM | POA: Diagnosis present

## 2023-04-25 DIAGNOSIS — R600 Localized edema: Secondary | ICD-10-CM | POA: Diagnosis not present

## 2023-04-25 DIAGNOSIS — K3189 Other diseases of stomach and duodenum: Secondary | ICD-10-CM | POA: Diagnosis present

## 2023-04-25 DIAGNOSIS — Z7901 Long term (current) use of anticoagulants: Secondary | ICD-10-CM

## 2023-04-25 DIAGNOSIS — I8510 Secondary esophageal varices without bleeding: Secondary | ICD-10-CM | POA: Diagnosis present

## 2023-04-25 DIAGNOSIS — Z96653 Presence of artificial knee joint, bilateral: Secondary | ICD-10-CM | POA: Diagnosis present

## 2023-04-25 DIAGNOSIS — R161 Splenomegaly, not elsewhere classified: Secondary | ICD-10-CM | POA: Diagnosis present

## 2023-04-25 DIAGNOSIS — F419 Anxiety disorder, unspecified: Secondary | ICD-10-CM | POA: Diagnosis present

## 2023-04-25 DIAGNOSIS — E876 Hypokalemia: Secondary | ICD-10-CM | POA: Diagnosis not present

## 2023-04-25 DIAGNOSIS — Z882 Allergy status to sulfonamides status: Secondary | ICD-10-CM

## 2023-04-25 DIAGNOSIS — R1900 Intra-abdominal and pelvic swelling, mass and lump, unspecified site: Secondary | ICD-10-CM | POA: Diagnosis present

## 2023-04-25 DIAGNOSIS — D638 Anemia in other chronic diseases classified elsewhere: Secondary | ICD-10-CM | POA: Diagnosis present

## 2023-04-25 DIAGNOSIS — R195 Other fecal abnormalities: Secondary | ICD-10-CM | POA: Diagnosis present

## 2023-04-25 DIAGNOSIS — R1310 Dysphagia, unspecified: Secondary | ICD-10-CM | POA: Diagnosis present

## 2023-04-25 DIAGNOSIS — Z87440 Personal history of urinary (tract) infections: Secondary | ICD-10-CM

## 2023-04-25 DIAGNOSIS — Z885 Allergy status to narcotic agent status: Secondary | ICD-10-CM

## 2023-04-25 DIAGNOSIS — D649 Anemia, unspecified: Secondary | ICD-10-CM | POA: Diagnosis not present

## 2023-04-25 DIAGNOSIS — Z86718 Personal history of other venous thrombosis and embolism: Secondary | ICD-10-CM

## 2023-04-25 DIAGNOSIS — D6959 Other secondary thrombocytopenia: Secondary | ICD-10-CM | POA: Diagnosis present

## 2023-04-25 DIAGNOSIS — R188 Other ascites: Secondary | ICD-10-CM

## 2023-04-25 DIAGNOSIS — I951 Orthostatic hypotension: Secondary | ICD-10-CM | POA: Diagnosis present

## 2023-04-25 DIAGNOSIS — A059 Bacterial foodborne intoxication, unspecified: Secondary | ICD-10-CM | POA: Diagnosis present

## 2023-04-25 DIAGNOSIS — F17210 Nicotine dependence, cigarettes, uncomplicated: Secondary | ICD-10-CM | POA: Diagnosis present

## 2023-04-25 DIAGNOSIS — Z8711 Personal history of peptic ulcer disease: Secondary | ICD-10-CM

## 2023-04-25 DIAGNOSIS — K7290 Hepatic failure, unspecified without coma: Secondary | ICD-10-CM | POA: Diagnosis present

## 2023-04-25 DIAGNOSIS — K259 Gastric ulcer, unspecified as acute or chronic, without hemorrhage or perforation: Secondary | ICD-10-CM | POA: Diagnosis not present

## 2023-04-25 DIAGNOSIS — D509 Iron deficiency anemia, unspecified: Secondary | ICD-10-CM | POA: Diagnosis present

## 2023-04-25 DIAGNOSIS — E2740 Unspecified adrenocortical insufficiency: Secondary | ICD-10-CM | POA: Diagnosis present

## 2023-04-25 DIAGNOSIS — F17290 Nicotine dependence, other tobacco product, uncomplicated: Secondary | ICD-10-CM | POA: Diagnosis present

## 2023-04-25 DIAGNOSIS — I82431 Acute embolism and thrombosis of right popliteal vein: Secondary | ICD-10-CM | POA: Diagnosis present

## 2023-04-25 DIAGNOSIS — Z888 Allergy status to other drugs, medicaments and biological substances status: Secondary | ICD-10-CM

## 2023-04-25 DIAGNOSIS — Z833 Family history of diabetes mellitus: Secondary | ICD-10-CM

## 2023-04-25 DIAGNOSIS — E8770 Fluid overload, unspecified: Secondary | ICD-10-CM | POA: Diagnosis present

## 2023-04-25 DIAGNOSIS — E66813 Obesity, class 3: Secondary | ICD-10-CM | POA: Diagnosis present

## 2023-04-25 HISTORY — PX: IR PARACENTESIS: IMG2679

## 2023-04-25 LAB — CBC WITH DIFFERENTIAL/PLATELET
Abs Immature Granulocytes: 0.01 10*3/uL (ref 0.00–0.07)
Basophils Absolute: 0 10*3/uL (ref 0.0–0.1)
Basophils Relative: 1 %
Eosinophils Absolute: 0.3 10*3/uL (ref 0.0–0.5)
Eosinophils Relative: 7 %
HCT: 22.9 % — ABNORMAL LOW (ref 36.0–46.0)
Hemoglobin: 7.3 g/dL — ABNORMAL LOW (ref 12.0–15.0)
Immature Granulocytes: 0 %
Lymphocytes Relative: 13 %
Lymphs Abs: 0.6 10*3/uL — ABNORMAL LOW (ref 0.7–4.0)
MCH: 30.4 pg (ref 26.0–34.0)
MCHC: 31.9 g/dL (ref 30.0–36.0)
MCV: 95.4 fL (ref 80.0–100.0)
Monocytes Absolute: 0.5 10*3/uL (ref 0.1–1.0)
Monocytes Relative: 10 %
Neutro Abs: 3.3 10*3/uL (ref 1.7–7.7)
Neutrophils Relative %: 69 %
Platelets: 130 10*3/uL — ABNORMAL LOW (ref 150–400)
RBC: 2.4 MIL/uL — ABNORMAL LOW (ref 3.87–5.11)
RDW: 14.4 % (ref 11.5–15.5)
WBC: 4.7 10*3/uL (ref 4.0–10.5)
nRBC: 0 % (ref 0.0–0.2)

## 2023-04-25 LAB — OCCULT BLOOD X 1 CARD TO LAB, STOOL: Fecal Occult Bld: NEGATIVE

## 2023-04-25 LAB — COMPREHENSIVE METABOLIC PANEL WITH GFR
ALT: 15 U/L (ref 0–44)
AST: 28 U/L (ref 15–41)
Albumin: 2.1 g/dL — ABNORMAL LOW (ref 3.5–5.0)
Alkaline Phosphatase: 120 U/L (ref 38–126)
Anion gap: 11 (ref 5–15)
BUN: 25 mg/dL — ABNORMAL HIGH (ref 8–23)
CO2: 26 mmol/L (ref 22–32)
Calcium: 8.1 mg/dL — ABNORMAL LOW (ref 8.9–10.3)
Chloride: 101 mmol/L (ref 98–111)
Creatinine, Ser: 2.25 mg/dL — ABNORMAL HIGH (ref 0.44–1.00)
GFR, Estimated: 23 mL/min — ABNORMAL LOW (ref >60)
Glucose, Bld: 100 mg/dL — ABNORMAL HIGH (ref 70–99)
Potassium: 2.7 mmol/L — CL (ref 3.5–5.1)
Sodium: 138 mmol/L (ref 135–145)
Total Bilirubin: 1.2 mg/dL (ref 0.0–1.2)
Total Protein: 5.4 g/dL — ABNORMAL LOW (ref 6.5–8.1)

## 2023-04-25 LAB — BASIC METABOLIC PANEL WITH GFR
Anion gap: 11 (ref 5–15)
BUN: 24 mg/dL — ABNORMAL HIGH (ref 8–23)
CO2: 23 mmol/L (ref 22–32)
Calcium: 8 mg/dL — ABNORMAL LOW (ref 8.9–10.3)
Chloride: 106 mmol/L (ref 98–111)
Creatinine, Ser: 2.07 mg/dL — ABNORMAL HIGH (ref 0.44–1.00)
GFR, Estimated: 26 mL/min — ABNORMAL LOW (ref >60)
Glucose, Bld: 89 mg/dL (ref 70–99)
Potassium: 3.1 mmol/L — ABNORMAL LOW (ref 3.5–5.1)
Sodium: 140 mmol/L (ref 135–145)

## 2023-04-25 LAB — GRAM STAIN: Gram Stain: NONE SEEN

## 2023-04-25 LAB — IRON AND TIBC
Iron: 40 ug/dL (ref 28–170)
Saturation Ratios: 22 % (ref 10.4–31.8)
TIBC: 181 ug/dL — ABNORMAL LOW (ref 250–450)
UIBC: 141 ug/dL

## 2023-04-25 LAB — HEMOGLOBIN AND HEMATOCRIT, BLOOD
HCT: 23 % — ABNORMAL LOW (ref 36.0–46.0)
Hemoglobin: 7.3 g/dL — ABNORMAL LOW (ref 12.0–15.0)

## 2023-04-25 LAB — GLUCOSE, PLEURAL OR PERITONEAL FLUID: Glucose, Fluid: 100 mg/dL

## 2023-04-25 LAB — LACTATE DEHYDROGENASE, PLEURAL OR PERITONEAL FLUID: LD, Fluid: <25 U/L — ABNORMAL HIGH (ref 3–23)

## 2023-04-25 LAB — RETICULOCYTES
Immature Retic Fract: 15.5 % (ref 2.3–15.9)
RBC.: 2.4 MIL/uL — ABNORMAL LOW (ref 3.87–5.11)
Retic Count, Absolute: 85.4 10*3/uL (ref 19.0–186.0)
Retic Ct Pct: 3.6 % — ABNORMAL HIGH (ref 0.4–3.1)

## 2023-04-25 LAB — ALBUMIN, PLEURAL OR PERITONEAL FLUID: Albumin, Fluid: <1.5 g/dL

## 2023-04-25 LAB — PROTEIN, PLEURAL OR PERITONEAL FLUID: Total protein, fluid: <3 g/dL

## 2023-04-25 LAB — MAGNESIUM: Magnesium: 1.8 mg/dL (ref 1.7–2.4)

## 2023-04-25 LAB — PROTIME-INR
INR: 1.3 — ABNORMAL HIGH (ref 0.8–1.2)
Prothrombin Time: 16.7 s — ABNORMAL HIGH (ref 11.4–15.2)

## 2023-04-25 LAB — FOLATE: Folate: 32.7 ng/mL (ref >5.9)

## 2023-04-25 LAB — FERRITIN: Ferritin: 48 ng/mL (ref 11–307)

## 2023-04-25 LAB — TROPONIN I (HIGH SENSITIVITY): Troponin I (High Sensitivity): 24 ng/L — ABNORMAL HIGH (ref <18)

## 2023-04-25 LAB — BRAIN NATRIURETIC PEPTIDE: B Natriuretic Peptide: 353.1 pg/mL — ABNORMAL HIGH (ref 0.0–100.0)

## 2023-04-25 LAB — HIV ANTIBODY (ROUTINE TESTING W REFLEX): HIV Screen 4th Generation wRfx: NONREACTIVE

## 2023-04-25 LAB — VITAMIN B12: Vitamin B-12: 1149 pg/mL — ABNORMAL HIGH (ref 180–914)

## 2023-04-25 MED ORDER — PROMETHAZINE HCL 25 MG PO TABS
12.5000 mg | ORAL_TABLET | Freq: Four times a day (QID) | ORAL | Status: DC | PRN
  Administered 2023-04-25 – 2023-04-30 (×5): 12.5 mg via ORAL
  Filled 2023-04-25 (×6): qty 1

## 2023-04-25 MED ORDER — FOLIC ACID 1 MG PO TABS: 1.0000 mg | ORAL_TABLET | Freq: Every day | ORAL | Status: DC

## 2023-04-25 MED ORDER — FOLIC ACID 1 MG PO TABS
1.0000 mg | ORAL_TABLET | Freq: Every day | ORAL | Status: DC
  Administered 2023-04-25 – 2023-05-02 (×7): 1 mg via ORAL
  Filled 2023-04-25 (×8): qty 1

## 2023-04-25 MED ORDER — FERROUS SULFATE 325 (65 FE) MG PO TABS
325.0000 mg | ORAL_TABLET | Freq: Every day | ORAL | Status: DC
  Administered 2023-04-26 – 2023-04-29 (×4): 325 mg via ORAL
  Filled 2023-04-25 (×6): qty 1

## 2023-04-25 MED ORDER — LIDOCAINE HCL 1 % IJ SOLN
20.0000 mL | Freq: Once | INTRAMUSCULAR | Status: AC
  Administered 2023-04-25: 10 mL

## 2023-04-25 MED ORDER — POTASSIUM CHLORIDE CRYS ER 20 MEQ PO TBCR
40.0000 meq | EXTENDED_RELEASE_TABLET | Freq: Once | ORAL | Status: AC
  Administered 2023-04-25: 40 meq via ORAL
  Filled 2023-04-25: qty 2

## 2023-04-25 MED ORDER — POTASSIUM CHLORIDE 10 MEQ/100ML IV SOLN
10.0000 meq | INTRAVENOUS | Status: AC
  Administered 2023-04-25 (×4): 10 meq via INTRAVENOUS
  Filled 2023-04-25 (×4): qty 100

## 2023-04-25 MED ORDER — LIDOCAINE HCL 1 % IJ SOLN
INTRAMUSCULAR | Status: AC
Start: 2023-04-25 — End: ?
  Filled 2023-04-25: qty 20

## 2023-04-25 MED ORDER — SODIUM CHLORIDE 0.9 % IV SOLN
12.5000 mg | Freq: Four times a day (QID) | INTRAVENOUS | Status: DC | PRN
  Filled 2023-04-25: qty 0.5

## 2023-04-25 MED ORDER — PANTOPRAZOLE SODIUM 40 MG PO TBEC
40.0000 mg | DELAYED_RELEASE_TABLET | Freq: Two times a day (BID) | ORAL | Status: DC
  Administered 2023-04-25 – 2023-05-02 (×14): 40 mg via ORAL
  Filled 2023-04-25 (×14): qty 1

## 2023-04-25 MED ORDER — LACTATED RINGERS IV BOLUS
500.0000 mL | Freq: Once | INTRAVENOUS | Status: AC
  Administered 2023-04-25: 500 mL via INTRAVENOUS

## 2023-04-25 MED ORDER — ROPINIROLE HCL 0.25 MG PO TABS
0.2500 mg | ORAL_TABLET | Freq: Every day | ORAL | Status: DC
  Administered 2023-04-25: 0.25 mg via ORAL
  Filled 2023-04-25: qty 1

## 2023-04-25 MED ORDER — SODIUM CHLORIDE 0.9% FLUSH
3.0000 mL | Freq: Two times a day (BID) | INTRAVENOUS | Status: DC
  Administered 2023-04-25 – 2023-05-02 (×9): 3 mL via INTRAVENOUS

## 2023-04-25 MED ORDER — MIDODRINE HCL 5 MG PO TABS
10.0000 mg | ORAL_TABLET | Freq: Three times a day (TID) | ORAL | Status: DC
Start: 2023-04-25 — End: 2023-05-02
  Administered 2023-04-25 – 2023-05-02 (×19): 10 mg via ORAL
  Filled 2023-04-25 (×19): qty 2

## 2023-04-25 MED ORDER — HEPARIN SODIUM (PORCINE) 5000 UNIT/ML IJ SOLN
5000.0000 [IU] | Freq: Three times a day (TID) | INTRAMUSCULAR | Status: DC
  Administered 2023-04-25 – 2023-04-26 (×4): 5000 [IU] via SUBCUTANEOUS
  Filled 2023-04-25 (×4): qty 1

## 2023-04-25 MED ORDER — SUCRALFATE 1 GM/10ML PO SUSP
1.0000 g | Freq: Two times a day (BID) | ORAL | Status: DC
  Administered 2023-04-25 – 2023-04-26 (×3): 1 g via ORAL
  Filled 2023-04-25 (×4): qty 10

## 2023-04-25 MED ORDER — FLUTICASONE FUROATE-VILANTEROL 100-25 MCG/ACT IN AEPB: 1.0000 | INHALATION_SPRAY | Freq: Every day | RESPIRATORY_TRACT | Status: DC | PRN

## 2023-04-25 MED ORDER — LIDOCAINE HCL 1 % IJ SOLN
INTRAMUSCULAR | Status: AC
  Filled 2023-04-25: qty 20

## 2023-04-25 MED ORDER — HYDROCORTISONE 20 MG PO TABS
20.0000 mg | ORAL_TABLET | Freq: Every day | ORAL | Status: DC
  Administered 2023-04-25 – 2023-05-02 (×7): 20 mg via ORAL
  Filled 2023-04-25 (×8): qty 1

## 2023-04-25 NOTE — Procedures (Signed)
 PROCEDURE SUMMARY:  Successful ultrasound guided paracentesis from the right lower quadrant.  Yielded 2 liters of clear yellow fluid.  No immediate complications.  The patient tolerated the procedure well.   Specimen was sent for labs.  EBL < 5mL  If the patient eventually requires >/=2 paracenteses in a 30 day period, screening evaluation by the [facility] will be assessed.  Lorely Bubb, PA-C

## 2023-04-25 NOTE — Progress Notes (Signed)
 [facility] night cross cover note:   I was notified by RN of the patient's request for resumption of home Requip  for restless legs.  From my brief review I did not see her Requip  on the current medication reconciliation.  I subsequently ordered Requip  25 mg p.o. nightly for restless legs, pending further medication reconciliation.     Camelia Cavalier, DO Hospitalist

## 2023-04-25 NOTE — ED Provider Notes (Signed)
 [place] EMERGENCY DEPARTMENT AT [facility] Provider Note   CSN: [id number] Arrival date & time: 04/25/23  0059     History  Chief Complaint  Patient presents with   Shortness of Breath    Carla Leonard is a 68 y.o. female.   Shortness of Breath Associated symptoms: no abdominal pain      68 year old female with medical history significant for seizure disorder, alcohol abuse, CKD, thrombocytopenia, adrenal insufficiency, alcoholic cirrhosis of the liver with ascites, history of DVT who presents the emergency department with abdominal swelling and shortness of breath.  The patient states that she has not had to have a paracentesis in 2 years.  She states that she has had worsening swelling of her abdomen endorses shortness of breath.  She denies any chest pain, fever or chills.  No cough.  She endorses bilateral lower extremity swelling.  She denies any abdominal pain.  She does state that she recently increased her dose of diuretic at home and now feels like her mouth is very dry.  It did not improve her shortness of breath.  The patient denies any melena or hematochezia.  She is on iron tablets for chronic anemia.  Her last echocardiogram in January 2025 revealed an EF of 55 to 60% with normal diastolic function.  She had mild aortic stenosis and trivial mitral regurgitation.  There is also trivial tricuspid valve regurgitation and mild pulmonary hypertension.     Home Medications Prior to Admission medications   Medication Sig Start Date End Date Taking? Authorizing Provider  albuterol  (VENTOLIN  HFA) 108 (90 Base) MCG/ACT inhaler Inhale 2 puffs into the lungs every 6 (six) hours as needed for wheezing or shortness of breath.    [provider]  ALPRAZolam  (XANAX ) 0.5 MG tablet Take 1 tablet by mouth 2 times a day as needed Patient taking differently: Take 0.5 mg by mouth every 6 (six) hours. 03/05/21     b complex vitamins capsule Take 2 capsules by mouth  daily.    [provider]  Cholecalciferol (VITAMIN D3) 1000 units CAPS Take 100 Units by mouth daily.    [provider]  ciprofloxacin  (CIPRO ) 500 MG tablet TAKE 1 TABLET EVERY DAY 03/06/22   Daina Drum, MD  Ferrous Sulfate  (IRON PO) Take 65 mg by mouth daily.    [provider]  folic acid  (FOLVITE ) 1 MG tablet Take 1 tablet (1 mg total) by mouth daily. 05/12/21   Leona Rake, MD  furosemide  (LASIX ) 20 MG tablet Take 3 tablets (60 mg total) by mouth daily. Patient taking differently: Take 20 mg by mouth daily as needed for fluid or edema. 05/18/21   Daina Drum, MD  hydrocortisone  (CORTEF ) 10 MG tablet Take 2 tablets (20 mg total) by mouth daily.  Do not stop it abruptly unless told by the endocrinologist. 04/12/21   Singh, Prashant K, MD  levETIRAcetam  (KEPPRA ) 500 MG tablet Take 1 tablet by mouth 2 times a day 08/21/20     loperamide  (IMODIUM ) 2 MG capsule Take 1 capsule (2 mg total) by mouth as needed for diarrhea or loose stools. 05/11/21   Leona Rake, MD  midodrine  (PROAMATINE ) 10 MG tablet Take 1 tablet (10 mg total) by mouth 3 (three) times daily with meals. 04/12/21   Singh, Prashant K, MD  oxyCODONE  (OXY IR/ROXICODONE ) 5 MG immediate release tablet Take 5 mg by mouth every 6 (six) hours. 03/09/22   [provider]  pantoprazole  (PROTONIX ) 40 MG  tablet Take 1 tablet (40 mg total) by mouth 2 (two) times daily before a meal. 05/19/22   Daina Drum, MD  potassium chloride  SA (KLOR-CON  M) 20 MEQ tablet TAKE 1 TABLET TWICE DAILY Patient taking differently: Take 20 mEq by mouth daily. 10 meq tab 06/20/22   Daina Drum, MD  primidone  (MYSOLINE ) 50 MG tablet Take 100 mg by mouth every morning. 01/21/21   [provider]  promethazine  (PHENERGAN ) 12.5 MG tablet Take 1 tablet (12.5 mg total) by mouth every 6 (six) hours as needed for nausea or vomiting. 08/06/21   Daina Drum, MD  spironolactone  (ALDACTONE ) 50 MG tablet Take 3 tablets (150 mg  total) by mouth 3 (three) times daily. Patient taking differently: Take 50 mg by mouth 2 (two) times daily. 04/26/22 06/24/22  Daina Drum, MD  sucralfate  (CARAFATE ) 1 GM/10ML suspension TAKE 10 MLS (1 G TOTAL) BY MOUTH 2 (TWO) TIMES DAILY. 11/11/21 06/24/22  Mansouraty, Albino Alu., MD  thiamine  100 MG tablet Take 1 tablet (100 mg total) by mouth daily. Patient not taking: Reported on 06/24/2022 05/12/21   Leona Rake, MD      Allergies    Elemental sulfur, Sulfa antibiotics, Prednisone, Duloxetine , Nsaids, and Other    Review of Systems   Review of Systems  Respiratory:  Positive for shortness of breath.   Gastrointestinal:  Positive for abdominal distention. Negative for abdominal pain.  All other systems reviewed and are negative.   Physical Exam Updated Vital Signs BP 120/65   Pulse 73   Temp 97.9 F (36.6 C) (Oral)   Resp 13   Ht 5\' 1"  (1.549 m)   Wt 108.9 kg   SpO2 100%   BMI 45.35 kg/m  Physical Exam Vitals and nursing note reviewed. Exam conducted with a chaperone present.  Constitutional:      General: She is not in acute distress.    Appearance: She is well-developed. She is obese.  HENT:     Head: Normocephalic and atraumatic.  Eyes:     Conjunctiva/sclera: Conjunctivae normal.  Cardiovascular:     Rate and Rhythm: Normal rate and regular rhythm.     Heart sounds: No murmur heard. Pulmonary:     Effort: Pulmonary effort is normal. No respiratory distress.     Breath sounds: Normal breath sounds.  Abdominal:     General: There is distension.     Palpations: Abdomen is soft.     Tenderness: There is no abdominal tenderness.  Genitourinary:    Rectum: Guaiac result negative.     Comments: No melena or hematochezia Musculoskeletal:     Cervical back: Neck supple.     Right lower leg: Edema present.     Left lower leg: Edema present.     Comments: 2+ pitting edema bilaterally  Skin:    General: Skin is warm and dry.     Capillary Refill: Capillary  refill takes less than 2 seconds.  Neurological:     Mental Status: She is alert.  Psychiatric:        Mood and Affect: Mood normal.     ED Results / Procedures / Treatments   Labs (all labs ordered are listed, but only abnormal results are displayed) Labs Reviewed  CBC WITH DIFFERENTIAL/PLATELET - Abnormal; Notable for the following components:      Result Value   RBC 2.40 (*)    Hemoglobin 7.3 (*)    HCT 22.9 (*)    Platelets 130 (*)  Lymphs Abs 0.6 (*)    All other components within normal limits  BRAIN NATRIURETIC PEPTIDE - Abnormal; Notable for the following components:   B Natriuretic Peptide 353.1 (*)    All other components within normal limits  COMPREHENSIVE METABOLIC PANEL WITH GFR - Abnormal; Notable for the following components:   Potassium 2.7 (*)    Glucose, Bld 100 (*)    BUN 25 (*)    Creatinine, Ser 2.25 (*)    Calcium 8.1 (*)    Total Protein 5.4 (*)    Albumin  2.1 (*)    GFR, Estimated 23 (*)    All other components within normal limits  TROPONIN I (HIGH SENSITIVITY) - Abnormal; Notable for the following components:   Troponin I (High Sensitivity) 24 (*)    All other components within normal limits  MAGNESIUM   OCCULT BLOOD X 1 CARD TO LAB, STOOL  POC OCCULT BLOOD, ED    EKG EKG Interpretation Date/Time:  Tuesday April 25 2023 01:13:29 EDT Ventricular Rate:  66 PR Interval:  60 QRS Duration:  104 QT Interval:  444 QTC Calculation: 466 R Axis:   23  Text Interpretation: Sinus rhythm Atrial premature complexes Short PR interval Low voltage, precordial leads Confirmed by Rosealee Concha ([id number]) on 04/25/2023 1:21:55 AM  Radiology No results found.  Procedures Procedures    Medications Ordered in ED Medications  potassium chloride  10 mEq in 100 mL IVPB (10 mEq Intravenous New Bag/Given 04/25/23 0407)  potassium chloride  SA (KLOR-CON  M) CR tablet 40 mEq (40 mEq Oral Given 04/25/23 0314)    ED Course/ Medical Decision Making/ A&P Clinical  Course as of 04/25/23 0453  Tue Apr 25, 2023  0324 Hemoglobin(!): 7.3 [JL]  0324 Potassium(!!): 2.7 [JL]  0324 Creatinine(!): 2.25 [JL]  0324 BUN(!): 25 [JL]  0452 Fecal Occult Blood, POC: NEGATIVE [JL]    Clinical Course User Index [JL] Rosealee Concha, MD                                 Medical Decision Making Amount and/or Complexity of Data Reviewed Labs: ordered. Decision-making details documented in ED Course. Radiology: ordered.  Risk Prescription drug management. Decision regarding hospitalization.    69 year old female with medical history significant for seizure disorder, alcohol abuse, CKD, thrombocytopenia, adrenal insufficiency, alcoholic cirrhosis of the liver with ascites, history of DVT who presents the emergency department with abdominal swelling and shortness of breath.  The patient states that she has not had to have a paracentesis in 2 years.  She states that she has had worsening swelling of her abdomen endorses shortness of breath.  She denies any chest pain, fever or chills.  No cough.  She endorses bilateral lower extremity swelling.  She denies any abdominal pain.  She does state that she recently increased her dose of diuretic at home and now feels like her mouth is very dry.  It did not improve her shortness of breath. The patient denies any melena or hematochezia.  She is on iron tablets for chronic anemia.  Her last echocardiogram in January 2025 revealed an EF of 55 to 60% with normal diastolic function.  She had mild aortic stenosis and trivial mitral regurgitation.  There is also trivial tricuspid valve regurgitation and mild pulmonary hypertension.    On arrival, the patient was afebrile, not tachycardic or tachypneic, BP 113/47, saturating 100% on room air.  Physical exam revealed clear lungs auscultation bilaterally,  old well-appearing obese female with a distended abdomen, nontender to palpation.  Considered worsening ascites resulting in shortness of  breath, very low concern for SBP with no fever, chills, no abdominal pain or tenderness on exam.  Considered CHF new onset however feel this to be less likely, considered viral infection, pneumonia, pneumothorax, PE all felt to be less likely based on history and exam findings.  EKG: Sinus rhythm, ventricular rate 6 6, no acute ischemic changes.   CXR: pending, no PTX or focal consolidation on my read  Labs: Hypokalemia to 2.7 noticed, replenished orally and IV, AKI on CKD with a creatinine of 2.25 from a baseline of 1.4-1.6, LFTs normal, CBC without a leukocytosis, anemia present which appears to be chronic, hemoglobin 7.3.  Fecal occult testing performed and negative. Magnesium  1.8.  Abdominal US : pending, I see a drainable fluid collection on my read  Patient does not need diagnostic paracentesis for SBP is low concern for SBP given above presentation.  Could benefit from therapeutic paracentesis with IR inpatient.  Patient could also be experiencing symptomatic anemia with her hemoglobin of 7.3.  Discussed with hospitalist medicine admission for further management of the patient's multiple medical problems to include likely symptomatic anemia, worsening ascites and abdominal distention, hypokalemia and AKI, Dr. Brice Campi of hospitalist medicine was consulted and accepted the patient in admission.  Final Clinical Impression(s) / ED Diagnoses Final diagnoses:  Ascites due to alcoholic cirrhosis (HCC)  SOB (shortness of breath)  Symptomatic anemia  Hypokalemia  AKI (acute kidney injury) (HCC)    Rx / DC Orders ED Discharge Orders     None         Rosealee Concha, MD 04/25/23 0530

## 2023-04-25 NOTE — H&P (Signed)
 History and Physical    Patient: Carla Leonard:[id number] DOB: December 22, 1955 DOA: 04/25/2023 DOS: the patient was seen and examined on 04/25/2023 PCP: Default, Provider, MD  Patient coming from: Home via EMS  Chief Complaint:  Chief Complaint  Patient presents with   Shortness of Breath   HPI: Carla Leonard is a 68 y.o. female with medical history significant of hypertension, alcoholic cirrhosis, CKD stage III, prior DVT, seizure disorder, anxiety, depression, adrenal sufficiency, drug overdose presents with complaints of abdominal swelling and shortness of breath.  She has been experiencing progressive abdominal swelling over the past week and a half, similar to a previous episode two years ago that required paracentesis.    She reports shortness of breath associated with the abdominal swelling and swelling in her right leg that persists despite leg elevation. She takes furosemide  for fluid management but notes recent ineffectiveness, prompting her to increase the dose to 60 mg two days ago.  She experienced food poisoning last week after consuming tea from [employer], leading to nausea and vomiting without diarrhea. The vomiting has resolved, but she has not eaten in four days and is on a low sodium diet. She also reports some epigastric discomfort, which she attributes to a recent ultrasound examination.  Emergency Department patient was noted to be afebrile with heart rates elevated up to 121, respirations elevated to 24, blood pressures 109/55- 120/65, and O2 saturation maintained on room air.  Labs significant for 1.3, platelets 130, potassium 2.7, BUN 25, creatinine 2.25, calcium 8.1, albumin  2.1, total bilirubin 1.2, BNP 353.1, and high-sensitivity troponin 24.  Chest x-ray noted no acute abnormality.  Fecal occult was negative.  Ultrasound of the abdomen revealed sequela of cirrhosis including nodular liver contour with moderate volume ascites and splenomegaly.  Patient has been  given 500 mL of lactated Ringer 's, and a total of 80 meq of potassium chloride . Review of Systems: As mentioned in the history of present illness. All other systems reviewed and are negative. Past Medical History:  Diagnosis Date   Allergy    "spring time",no medications   Anxiety    Arthritis    back,knee,hands   Asthma    "as a child"   Depression    History of kidney stones    Hypertension    pt.denies updated 02/07/22   Insomnia    Seizures (HCC)    last 2 1/2 years ago   Past Surgical History:  Procedure Laterality Date   APPENDECTOMY     BIOPSY  08/09/2021   Procedure: BIOPSY;  Surgeon: Normie Becton., MD;  Location: [facility];  Service: Gastroenterology;;   CHOLECYSTECTOMY     COLONOSCOPY WITH PROPOFOL  N/A 02/24/2022   Procedure: COLONOSCOPY WITH PROPOFOL ;  Surgeon: Daina Drum, MD;  Location: [facility];  Service: Gastroenterology;  Laterality: N/A;   ELBOW SURGERY     RIGHT   ESOPHAGOGASTRODUODENOSCOPY N/A 08/09/2021   Procedure: ESOPHAGOGASTRODUODENOSCOPY (EGD);  Surgeon: Normie Becton., MD;  Location: [facility];  Service: Gastroenterology;  Laterality: N/A;   ESOPHAGOGASTRODUODENOSCOPY (EGD) WITH PROPOFOL  N/A 06/30/2022   Procedure: ESOPHAGOGASTRODUODENOSCOPY (EGD) WITH PROPOFOL ;  Surgeon: Daina Drum, MD;  Location: [facility];  Service: Gastroenterology;  Laterality: N/A;   EUS N/A 08/09/2021   Procedure: UPPER ENDOSCOPIC ULTRASOUND (EUS) LINEAR ;  Surgeon: Normie Becton., MD;  Location: [facility];  Service: Gastroenterology;  Laterality: N/A;   HEMOSTASIS CLIP PLACEMENT  08/09/2021   Procedure: HEMOSTASIS CLIP PLACEMENT;  Surgeon: Normie Becton., MD;  Location:  [facility];  Service: Gastroenterology;;   HEMOSTASIS CLIP PLACEMENT  06/30/2022   Procedure: HEMOSTASIS CLIP PLACEMENT;  Surgeon: Daina Drum, MD;  Location: [facility];  Service: Gastroenterology;;   HOT HEMOSTASIS N/A 08/09/2021   Procedure: HOT HEMOSTASIS  (ARGON PLASMA COAGULATION/BICAP);  Surgeon: Normie Becton., MD;  Location: [facility];  Service: Gastroenterology;  Laterality: N/A;   IR PARACENTESIS  04/28/2021   IR PARACENTESIS  05/06/2021   IR PARACENTESIS  05/11/2021   JOINT REPLACEMENT     right TKA 06/2010   KNEE ARTHROPLASTY  06/17/2011   Procedure: COMPUTER ASSISTED TOTAL KNEE ARTHROPLASTY;  Surgeon: Boston Byers, MD;  Location: [facility];  Service: Orthopedics;  Laterality: Left;  TOTAL KNEE REPLACEMENT WITH GENERAL ANESTHESIA AND PRE OP FEMORAL NERVE BLOCK   PERIANAL CYST     POLYPECTOMY  08/09/2021   Procedure: POLYPECTOMY;  Surgeon: Brice Campi Albino Alu., MD;  Location: [facility];  Service: Gastroenterology;;   POLYPECTOMY  02/24/2022   Procedure: POLYPECTOMY;  Surgeon: Daina Drum, MD;  Location: [facility];  Service: Gastroenterology;;   POLYPECTOMY  06/30/2022   Procedure: POLYPECTOMY;  Surgeon: Daina Drum, MD;  Location: [facility];  Service: Gastroenterology;;   SUBMUCOSAL TATTOO INJECTION  08/09/2021   Procedure: SUBMUCOSAL TATTOO INJECTION;  Surgeon: Normie Becton., MD;  Location: [facility];  Service: Gastroenterology;;   Social History:  reports that she has quit smoking. Her smoking use included cigarettes. She has never used smokeless tobacco. She reports that she does not currently use alcohol. She reports that she does not currently use drugs after having used the following drugs: Marijuana.  Allergies  Allergen Reactions   Elemental Sulfur Anaphylaxis   Sulfa Antibiotics Anaphylaxis   Prednisone Other (See Comments)    hallucinations   Duloxetine  Other (See Comments)    Per patient   Nsaids Other (See Comments)    Stomach pain, diarrhea   Other Other (See Comments)    Steroids: hallucinations, tolerates hydrocortisone      Family History  Problem Relation Age of Onset   Pneumonia Mother    Diabetes Father    Colon cancer Neg Hx    Esophageal cancer Neg Hx    Stomach cancer Neg  Hx    Pancreatic cancer Neg Hx    Colon polyps Neg Hx    Crohn's disease Neg Hx    Rectal cancer Neg Hx    Ulcerative colitis Neg Hx     Prior to Admission medications   Medication Sig Start Date End Date Taking? Authorizing Provider  acetaminophen  (TYLENOL ) 500 MG tablet Take 1,500 mg by mouth daily as needed. Low back pain   Yes [provider]  albuterol  (VENTOLIN  HFA) 108 (90 Base) MCG/ACT inhaler Inhale 2 puffs into the lungs every 6 (six) hours as needed for wheezing or shortness of breath.   Yes [provider]  b complex vitamins capsule Take 1 capsule by mouth daily.   Yes [provider]  BREO ELLIPTA  100-25 MCG/ACT AEPB Inhale 1 puff into the lungs as needed (SOB and wheezing). 01/19/23  Yes [provider]  Cholecalciferol (VITAMIN D3) 1000 units CAPS Take 1,000 Units by mouth daily.   Yes [provider]  ciprofloxacin  (CIPRO ) 500 MG tablet TAKE 1 TABLET EVERY DAY 03/06/22  Yes Daina Drum, MD  Cranberry 500 MG CAPS Take 1 capsule by mouth daily.   Yes [provider]  ferrous sulfate  325 (65 FE) MG EC tablet Take 325 mg by  mouth daily with breakfast.   Yes [provider]  folic acid  (FOLVITE ) 1 MG tablet Take 1 tablet (1 mg total) by mouth daily. 05/12/21  Yes Leona Rake, MD  furosemide  (LASIX ) 20 MG tablet Take 3 tablets (60 mg total) by mouth daily. Patient taking differently: Take 40 mg by mouth daily. 05/18/21  Yes Daina Drum, MD  hydrocortisone  (CORTEF ) 10 MG tablet Take 2 tablets (20 mg total) by mouth daily.  Do not stop it abruptly unless told by the endocrinologist. 04/12/21  Yes Singh, Prashant K, MD  midodrine  (PROAMATINE ) 10 MG tablet Take 1 tablet (10 mg total) by mouth 3 (three) times daily with meals. 04/12/21  Yes Singh, Prashant K, MD  pantoprazole  (PROTONIX ) 40 MG tablet Take 1 tablet (40 mg total) by mouth 2 (two) times daily before a meal. 05/19/22  Yes Daina Drum, MD  potassium chloride   SA (KLOR-CON  M) 20 MEQ tablet TAKE 1 TABLET TWICE DAILY 06/20/22  Yes Dorsey, Ying C, MD  promethazine  (PHENERGAN ) 12.5 MG tablet Take 1 tablet (12.5 mg total) by mouth every 6 (six) hours as needed for nausea or vomiting. 08/06/21  Yes Daina Drum, MD  spironolactone  (ALDACTONE ) 50 MG tablet Take 3 tablets (150 mg total) by mouth 3 (three) times daily. Patient taking differently: Take 50 mg by mouth daily. 04/26/22 11/28/23 Yes Daina Drum, MD  sucralfate  (CARAFATE ) 1 GM/10ML suspension TAKE 10 MLS (1 G TOTAL) BY MOUTH 2 (TWO) TIMES DAILY. 11/11/21 11/28/23 Yes Mansouraty, Albino Alu., MD    Physical Exam: Vitals:   04/25/23 0500 04/25/23 0615 04/25/23 0630 04/25/23 0731  BP: (!) 115/52 (!) 119/57 (!) 109/55 (!) 123/50  Pulse: (!) 121 76 77 94  Resp: (!) 24 15 15 19   Temp:  97.7 F (36.5 C)  97.8 F (36.6 C)  TempSrc:  Oral  Oral  SpO2: 100% 100% 100% 100%  Weight:      Height:        Constitutional: Elderly female who appears to be in no acute distress at this time. Eyes: PERRL, lids and conjunctivae normal ENMT: Mucous membranes are moist.  Bottom dentures missing currently. Neck: normal, supple  Respiratory: clear to auscultation bilaterally, no wheezing, no crackles.   Cardiovascular: Regular rate and rhythm, no murmurs / rubs / gallops.  At least +2 pitting edema with swelling most notably on the right.. Abdomen: Abdominal distention present.  Bowel sounds present.  Musculoskeletal: no clubbing / cyanosis. No joint deformity upper and lower extremities. Good ROM, no contractures. Normal muscle tone.  Skin: Venous stasis changes of the lower extremities. Neurologic: CN 2-12 grossly intact.   Strength 5/5 in all 4.  Psychiatric: Normal judgment and insight. Alert and oriented x 3. Normal mood.   Data Reviewed:  EKG reveals sinus rhythm at 66 bpm with premature atrial complexes.  Reviewed labs, imaging, and pertinent records as documented.  Assessment and  Plan:  Decompensated cirrhosis with ascites Patient presents with abdominal swelling and shortness of breath.  On physical exam patient with abdominal distention and pain.  Ultrasound revealed revealed sequela of cirrhosis including nodular liver contour with moderate volume ascites and splenomegaly.  Lower suspicion for SBP at this time but patient was noted to be tachycardic.  She reports compliance with furosemide  and spironolactone . - Admit to medical telemetry bed - Add on PT/INR - IR consult for paracentesis with lab work including culture, protein, and LDH - Initially held Aldactone  and furosemide .  I will reassess  and determine when medically appropriate to resume  Acute kidney injury superimposed on chronic kidney disease stage IIIb Creatinine noted to be elevated at 2.25 with BUN 25.  Baseline creatinine previously noted to be around 1.4.  Initially given 500 mL bolus of IV fluids.  There was no signs of urinary retention noted.  Records note patient had previously been treated for urinary tract infection last month. - Check urinalysis - Check urine sodium, urine urea , and urine creatinine - Avoid possible nephrotoxic agents - Continue to monitor kidney function daily  Elevated troponin Patient denies any complaints of chest pain at this time.  High-sensitivity troponin mildly elevated at 24.  EKG without significant ischemic changes noted.  Patient with recent echocardiogram in 01/2023 which noted EF to be 55 to 60% with normal systolic function, mildly dilated right atrium, mild aortic stenosis, and mild pulmonary hypertension. - Continue to monitor  Hypokalemia  Acute.  Potassium noted to be 2.7 with magnesium  1.8.  Possibly related to recent increase in diuretic.  Patient had been given 80 mEq of potassium chloride . - Recheck potassium levels - Continue to monitor and replace as deemed medically appropriate.  Normocytic anemia Acute on chronic.  Hemoglobin noted to be 7.5.   Iron studies from 04/28/2022 noted iron low at 16 with TIBC 291, ferritin 44, and iron saturation 5.  Last available hemoglobin noted to be 8.2 when checked on 06/30/2022.  Patient with history of one 3 mm polyp in the sigmoid colon removed and nonbleeding internal hemorrhoids on last colonoscopy done by Dr. Rosaline Coma 02/2022.  Stool guaiacs were noted to be negative. -Check anemia panel and recheck CBC -Consider blood transfusion if hemoglobin noted to drop below 7 g/dL  Thrombocytopenia Acute.  Platelet count noted to be 130, but previously noted to be similarly low back in 2023. - Continue to monitor  Lower extremity swelling History of DVT Patient reports having increased right leg swelling more so than the left.  On physical exam patient has at least 2+ pitting edema.  Patient with prior history of DVT but not currently on anticoagulation. - Check Doppler ultrasound of the lower extremity  Adrenal insufficiency Orthostatic hypotension Blood pressure currently stable. - Continue midodrine  and hydrocortisone    DVT prophylaxis: None.  Start DVT prophylaxis when medically appropriate Advance Care Planning:   Code Status: Full Code   Consults: None  Family Communication: None  Severity of Illness: The appropriate patient status for this patient is INPATIENT. Inpatient status is judged to be reasonable and necessary in order to provide the required intensity of service to ensure the patient's safety. The patient's presenting symptoms, physical exam findings, and initial radiographic and laboratory data in the context of their chronic comorbidities is felt to place them at high risk for further clinical deterioration. Furthermore, it is not anticipated that the patient will be medically stable for discharge from the hospital within 2 midnights of admission.   * I certify that at the point of admission it is my clinical judgment that the patient will require inpatient hospital care spanning  beyond 2 midnights from the point of admission due to high intensity of service, high risk for further deterioration and high frequency of surveillance required.*  Author: Lena Qualia, MD 04/25/2023 7:43 AM  For on call review [email or web address].

## 2023-04-25 NOTE — ED Triage Notes (Signed)
 Patient comes in from home via EMS with complaints of shortness of breath. Patient states that when she feel like this, she needs to have a paracentesis. Patient is 100% on RA. Patient states that she has not been taking her lasix  medication regularly. Patient does have some lower leg edema as well. Pmh- liver cirrhosis

## 2023-04-26 ENCOUNTER — Inpatient Hospital Stay (HOSPITAL_COMMUNITY)

## 2023-04-26 DIAGNOSIS — M7989 Other specified soft tissue disorders: Secondary | ICD-10-CM | POA: Diagnosis not present

## 2023-04-26 DIAGNOSIS — K7460 Unspecified cirrhosis of liver: Secondary | ICD-10-CM | POA: Diagnosis not present

## 2023-04-26 DIAGNOSIS — K7290 Hepatic failure, unspecified without coma: Secondary | ICD-10-CM | POA: Diagnosis not present

## 2023-04-26 LAB — COMPREHENSIVE METABOLIC PANEL WITH GFR
ALT: 16 U/L (ref 0–44)
AST: 25 U/L (ref 15–41)
Albumin: 1.9 g/dL — ABNORMAL LOW (ref 3.5–5.0)
Alkaline Phosphatase: 112 U/L (ref 38–126)
Anion gap: 9 (ref 5–15)
BUN: 22 mg/dL (ref 8–23)
CO2: 25 mmol/L (ref 22–32)
Calcium: 7.8 mg/dL — ABNORMAL LOW (ref 8.9–10.3)
Chloride: 104 mmol/L (ref 98–111)
Creatinine, Ser: 1.89 mg/dL — ABNORMAL HIGH (ref 0.44–1.00)
GFR, Estimated: 29 mL/min — ABNORMAL LOW (ref >60)
Glucose, Bld: 94 mg/dL (ref 70–99)
Potassium: 3.1 mmol/L — ABNORMAL LOW (ref 3.5–5.1)
Sodium: 138 mmol/L (ref 135–145)
Total Bilirubin: 1 mg/dL (ref 0.0–1.2)
Total Protein: 5.1 g/dL — ABNORMAL LOW (ref 6.5–8.1)

## 2023-04-26 LAB — CBC
HCT: 21.6 % — ABNORMAL LOW (ref 36.0–46.0)
Hemoglobin: 6.8 g/dL — CL (ref 12.0–15.0)
MCH: 29.6 pg (ref 26.0–34.0)
MCHC: 31.5 g/dL (ref 30.0–36.0)
MCV: 93.9 fL (ref 80.0–100.0)
Platelets: 124 10*3/uL — ABNORMAL LOW (ref 150–400)
RBC: 2.3 MIL/uL — ABNORMAL LOW (ref 3.87–5.11)
RDW: 14.2 % (ref 11.5–15.5)
WBC: 4.6 10*3/uL (ref 4.0–10.5)
nRBC: 0 % (ref 0.0–0.2)

## 2023-04-26 LAB — URINALYSIS, W/ REFLEX TO CULTURE (INFECTION SUSPECTED)
Bacteria, UA: NONE SEEN
Bilirubin Urine: NEGATIVE
Glucose, UA: NEGATIVE mg/dL
Hgb urine dipstick: NEGATIVE
Ketones, ur: NEGATIVE mg/dL
Nitrite: NEGATIVE
Protein, ur: NEGATIVE mg/dL
Specific Gravity, Urine: 1.011 (ref 1.005–1.030)
pH: 5 (ref 5.0–8.0)

## 2023-04-26 LAB — PREPARE RBC (CROSSMATCH)

## 2023-04-26 LAB — CREATININE, URINE, RANDOM: Creatinine, Urine: 105 mg/dL

## 2023-04-26 LAB — SODIUM, URINE, RANDOM: Sodium, Ur: 31 mmol/L

## 2023-04-26 MED ORDER — ALBUMIN HUMAN 25 % IV SOLN
25.0000 g | Freq: Four times a day (QID) | INTRAVENOUS | Status: AC
  Administered 2023-04-26 (×3): 25 g via INTRAVENOUS
  Filled 2023-04-26 (×3): qty 100

## 2023-04-26 MED ORDER — LORAZEPAM 0.5 MG PO TABS
0.5000 mg | ORAL_TABLET | Freq: Three times a day (TID) | ORAL | Status: DC | PRN
  Administered 2023-04-26 – 2023-04-28 (×5): 0.5 mg via ORAL
  Filled 2023-04-26 (×5): qty 1

## 2023-04-26 MED ORDER — OXYCODONE HCL 5 MG PO TABS
5.0000 mg | ORAL_TABLET | ORAL | Status: DC | PRN
  Administered 2023-04-26 – 2023-05-02 (×8): 5 mg via ORAL
  Filled 2023-04-26 (×9): qty 1

## 2023-04-26 MED ORDER — MAGNESIUM SULFATE 2 GM/50ML IV SOLN
2.0000 g | Freq: Once | INTRAVENOUS | Status: AC
  Administered 2023-04-26: 2 g via INTRAVENOUS
  Filled 2023-04-26: qty 50

## 2023-04-26 MED ORDER — SODIUM CHLORIDE 0.9% IV SOLUTION: Freq: Once | INTRAVENOUS | Status: DC

## 2023-04-26 MED ORDER — HEPARIN (PORCINE) 25000 UT/250ML-% IV SOLN
1100.0000 [IU]/h | INTRAVENOUS | Status: DC
  Administered 2023-04-26: 900 [IU]/h via INTRAVENOUS
  Filled 2023-04-26: qty 250

## 2023-04-26 MED ORDER — POTASSIUM CHLORIDE CRYS ER 20 MEQ PO TBCR
40.0000 meq | EXTENDED_RELEASE_TABLET | ORAL | Status: AC
  Administered 2023-04-26 (×2): 40 meq via ORAL
  Filled 2023-04-26 (×2): qty 2

## 2023-04-26 MED ORDER — ROPINIROLE HCL 0.25 MG PO TABS
0.2500 mg | ORAL_TABLET | Freq: Three times a day (TID) | ORAL | Status: DC | PRN
  Administered 2023-04-26 – 2023-05-01 (×5): 0.25 mg via ORAL
  Filled 2023-04-26 (×6): qty 1

## 2023-04-26 MED ORDER — ENSURE ENLIVE PO LIQD
237.0000 mL | Freq: Two times a day (BID) | ORAL | Status: DC
  Administered 2023-04-26 – 2023-05-02 (×5): 237 mL via ORAL

## 2023-04-26 NOTE — Progress Notes (Signed)
 PHARMACY - ANTICOAGULATION CONSULT NOTE  Pharmacy Consult for Heparin  Indication: DVT  Allergies  Allergen Reactions   Elemental Sulfur Anaphylaxis   Sulfa Antibiotics Anaphylaxis   Prednisone Other (See Comments)    hallucinations   Duloxetine  Other (See Comments)    Per patient   Nsaids Other (See Comments)    Stomach pain, diarrhea   Other Other (See Comments)    Steroids: hallucinations, tolerates hydrocortisone      Patient Measurements: Height: 5\' 1"  (154.9 cm) Weight: 108.9 kg (240 lb) IBW/kg (Calculated) : 47.8 HEPARIN  DW (KG): 74.5  Vital Signs: Temp: 98.2 F (36.8 C) (04/23 1415) Temp Source: Oral (04/23 1415) BP: 123/47 (04/23 1415) Pulse Rate: 70 (04/23 1415)  Labs: Recent Labs    04/25/23 0122 04/25/23 0903 04/25/23 0907 04/26/23 0528  HGB 7.3* 7.3*  --  6.8*  HCT 22.9* 23.0*  --  21.6*  PLT 130*  --   --  124*  LABPROT  --   --  16.7*  --   INR  --   --  1.3*  --   CREATININE 2.25* 2.07*  --  1.89*  TROPONINIHS 24*  --   --   --     Estimated Creatinine Clearance: 32.9 mL/min (A) (by C-G formula based on SCr of 1.89 mg/dL (H)).   Medical History: Past Medical History:  Diagnosis Date   Allergy    "spring time",no medications   Anxiety    Arthritis    back,knee,hands   Asthma    "as a child"   Depression    History of kidney stones    Hypertension    pt.denies updated 02/07/22   Insomnia    Seizures (HCC)    last 2 1/2 years ago    Assessment:  68 yr old female to begin IV Heparin  for RLE DVT.  No heparin  bolus due to anemia per MD instructions.  Will target low therapeutic heparin  levels.  Has been on Heparin  5000 units SQ q8hrs and last dose given ~6am today. Hemoglobin 6.8 this morning and 1 unit PRBCs given.  Platelet count low at 130>124K. No bleeding noted.  Hx prior DVT in 2023, cirrhosis.  Goal of Therapy:  Heparin  level 0.3-0.5 units/ml Monitor platelets by anticoagulation protocol: Yes   Plan:  Discontinue SQ  heparin . Begin heparin  drip at 900 units/hr (~12 units/kg/hr) Heparin  level and CBC ~8 hours after drip begins. Daily heparin  level and CBC while on heparin . Monitor for signs/symptoms of bleeding.  Adolphus Akin, [place] 04/26/2023,4:11 PM

## 2023-04-26 NOTE — Plan of Care (Signed)

## 2023-04-26 NOTE — Progress Notes (Signed)
 PROGRESS NOTE  Carla Leonard YQM:[id number] DOB: [birth year] DOA: 04/25/2023 PCP: Default, Provider, MD   LOS: 1 day   Brief Narrative / Interim history: 68 year old female with HTN, alcoholic cirrhosis, CKD 3, prior DVT, nonepileptic spells, adrenal insufficiency, who comes to the hospital with complaints of progressive abdominal swelling, similar to a prior episode 2 years ago when she required paracentesis.  She has been taking more furosemide  at home but that did not help.  Also reports recent episode of food poisoning with nausea and vomiting, this is now resolved.  Subjective / 24h Interval events: Complains of significant anxiety this morning as well as diffuse back pain.  Assesement and Plan: Principal Problem:   Decompensated cirrhosis (HCC) Active Problems:   Acute kidney injury superimposed on chronic kidney disease (HCC)   Elevated troponin   Hypokalemia   Normocytic anemia   Thrombocytopenia (HCC)   Lower extremity edema  Principal problem Anemia, acute on chronic -likely in the setting of underlying liver disease, she has no bleeding.  She is well aware of presentation of gastrointestinal bleed and denies any bright red blood or melena.  Fecal occult was negative as well.  Anemia panel fairly unremarkable - Hemoglobin slightly lower this morning, probably dilutional as she received fluids.  Transfuse 1 unit of packed red blood cells  Active problems Acute kidney injury on chronic kidney disease stage IIIb -baseline creatinine around 1.4, it was 2.2 on admission.  She received IV fluids, creatinine gradually improving, and this morning it is better at 1.89.  Encouraged p.o. intake  Liver cirrhosis, decompensated-with ascites.  Underwent paracentesis on 4/22 with 2 L of fluid removed.  Fluid analysis with negative Gram stain, no growth, but do not see cell count - Also administer albumin  to help with fluid shifts  Hypokalemia-due to recent illness with nausea, vomiting,  poor p.o. intake  Anxiety -patient reports increased anxiety recently due to her fluid overload.  Tells me she takes Ativan  at home, resume that here  ?  Dysphagia -patient reports that every time she tries to eat feels like her throat is getting swollen.  Denies food being stuck.  She says she has had this in the past when she feels very anxious.  Treat anxiety as above, and if persistent may need speech/further testing  Elevated troponin-no chest pain, flat, not in a pattern consistent with ACS, likely demand ischemia  Adrenal insufficiency-continue midodrine , hydrocortisone   Lower extremity swelling, history of DVT-Doppler ultrasound pending.  She appreciates swelling is improved today  Thrombocytopenia-due to liver disease, stable, no bleeding  Obesity, morbid-BMI 45.  She would benefit from weight loss   Scheduled Meds:  sodium chloride    Intravenous Once   feeding supplement  237 mL Oral BID BM   ferrous sulfate   325 mg Oral Q breakfast   folic acid   1 mg Oral Daily   heparin   5,000 Units Subcutaneous Q8H   hydrocortisone   20 mg Oral Daily   midodrine   10 mg Oral TID WC   pantoprazole   40 mg Oral BID AC   potassium chloride   40 mEq Oral Q2H   sodium chloride  flush  3 mL Intravenous Q12H   sucralfate   1 g Oral BID WC   Continuous Infusions:  albumin  human 25 g (04/26/23 0854)   promethazine  (PHENERGAN ) injection (IM or IVPB)     PRN Meds:.fluticasone  furoate-vilanterol, LORazepam , oxyCODONE , promethazine  **OR** promethazine  (PHENERGAN ) injection (IM or IVPB), rOPINIRole   Current Outpatient Medications  Medication Instructions   acetaminophen  (TYLENOL )  1,500 mg, Oral, Daily PRN, Low back pain   albuterol  (VENTOLIN  HFA) 108 (90 Base) MCG/ACT inhaler 2 puffs, Inhalation, Every 6 hours PRN   b complex vitamins capsule 1 capsule, Oral, Daily   BREO ELLIPTA  100-25 MCG/ACT AEPB 1 puff, Inhalation, As needed   ciprofloxacin  (CIPRO ) 500 mg, Oral, Daily   Cranberry 500 MG CAPS 1  capsule, Oral, Daily   ferrous sulfate  325 mg, Oral, Daily with breakfast   folic acid  (FOLVITE ) 1 mg, Oral, Daily   furosemide  (LASIX ) 60 mg, Oral, Daily   hydrocortisone  (CORTEF ) 10 MG tablet Take 2 tablets (20 mg total) by mouth daily.  Do not stop it abruptly unless told by the endocrinologist.   midodrine  (PROAMATINE ) 10 mg, Oral, 3 times daily with meals   pantoprazole  (PROTONIX ) 40 mg, Oral, 2 times daily before meals   potassium chloride  SA (KLOR-CON  M) 20 MEQ tablet 20 mEq, Oral, 2 times daily   promethazine  (PHENERGAN ) 12.5 mg, Oral, Every 6 hours PRN   rOPINIRole  (REQUIP ) 0.25 mg, Daily at bedtime   spironolactone  (ALDACTONE ) 150 mg, Oral, 3 times daily   sucralfate  (CARAFATE ) 1 g, Oral, 2 times daily   Vitamin D3 1,000 Units, Oral, Daily    Diet Orders (From admission, onward)     Start     Ordered   04/25/23 0945  Diet 2 gram sodium Room service appropriate? Yes; Fluid consistency: Thin  Diet effective now       Question Answer Comment  Room service appropriate? Yes   Fluid consistency: Thin      04/25/23 0944            DVT prophylaxis: heparin  injection 5,000 Units Start: 04/25/23 0830 SCDs Start: 04/25/23 0825   Lab Results  Component Value Date   PLT 124 (L) 04/26/2023      Code Status: Full Code  Family Communication: No family at bedside  Status is: Inpatient Remains inpatient appropriate because: Severity of illness   Level of care: Telemetry Medical  Consultants:  None  Objective: Vitals:   04/25/23 1524 04/25/23 1937 04/26/23 0606 04/26/23 0841  BP: (!) 123/55 (!) 116/55 (!) 102/51 (!) 97/49  Pulse:  73 83 90  Resp: 14 18 18 18   Temp: 98.4 F (36.9 C) 98.3 F (36.8 C) 98.4 F (36.9 C) 98.1 F (36.7 C)  TempSrc: Oral Oral Oral Oral  SpO2: 99% 100% 97% 99%  Weight:      Height:       No intake or output data in the 24 hours ending 04/26/23 1007 Wt Readings from Last 3 Encounters:  04/25/23 108.9 kg  03/25/23 110.2 kg   06/30/22 113.8 kg    Examination:  Constitutional: NAD Eyes: no scleral icterus ENMT: Mucous membranes are moist.  Neck: normal, supple Respiratory: clear to auscultation bilaterally, no wheezing, no crackles. Normal respiratory effort.  Cardiovascular: Regular rate and rhythm, no murmurs / rubs / gallops.  Trace LE edema.  Abdomen: non distended, no tenderness. Bowel sounds positive.  Musculoskeletal: no clubbing / cyanosis.   Data Reviewed: I have independently reviewed following labs and imaging studies   CBC Recent Labs  Lab 04/25/23 0122 04/25/23 0903 04/26/23 0528  WBC 4.7  --  4.6  HGB 7.3* 7.3* 6.8*  HCT 22.9* 23.0* 21.6*  PLT 130*  --  124*  MCV 95.4  --  93.9  MCH 30.4  --  29.6  MCHC 31.9  --  31.5  RDW 14.4  --  14.2  LYMPHSABS 0.6*  --   --   MONOABS 0.5  --   --   EOSABS 0.3  --   --   BASOSABS 0.0  --   --     Recent Labs  Lab 04/25/23 0122 04/25/23 0903 04/25/23 0907 04/26/23 0528  NA 138 140  --  138  K 2.7* 3.1*  --  3.1*  CL 101 106  --  104  CO2 26 23  --  25  GLUCOSE 100* 89  --  94  BUN 25* 24*  --  22  CREATININE 2.25* 2.07*  --  1.89*  CALCIUM 8.1* 8.0*  --  7.8*  AST 28  --   --  25  ALT 15  --   --  16  ALKPHOS 120  --   --  112  BILITOT 1.2  --   --  1.0  ALBUMIN  2.1*  --   --  1.9*  MG 1.8  --   --   --   INR  --   --  1.3*  --   BNP 353.1*  --   --   --     ------------------------------------------------------------------------------------------------------------------ No results for input(s): "CHOL", "HDL", "LDLCALC", "TRIG", "CHOLHDL", "LDLDIRECT" in the last 72 hours.  No results found for: "HGBA1C" ------------------------------------------------------------------------------------------------------------------ No results for input(s): "TSH", "T4TOTAL", "T3FREE", "THYROIDAB" in the last 72 hours.  Invalid input(s): "FREET3"  Cardiac Enzymes No results for input(s): "CKMB", "TROPONINI", "MYOGLOBIN" in the last  168 hours.  Invalid input(s): "CK" ------------------------------------------------------------------------------------------------------------------    Component Value Date/Time   BNP 353.1 (H) 04/25/2023 0122    CBG: No results for input(s): "GLUCAP" in the last 168 hours.  Recent Results (from the past 240 hours)  Culture, body fluid w Gram Stain-bottle     Status: None (Preliminary result)   Collection Time: 04/25/23 12:10 PM   Specimen: Pleura  Result Value Ref Range Status   Specimen Description PLEURAL  Final   Special Requests NONE  Final   Culture   Final    NO GROWTH < 24 HOURS Performed at [facility], [address], [place], [place] [address]    Report Status PENDING  Incomplete  Gram stain     Status: None   Collection Time: 04/25/23 12:10 PM   Specimen: Pleura  Result Value Ref Range Status   Specimen Description PLEURAL  Final   Special Requests NONE  Final   Gram Stain   Final    NO WBC SEEN NO ORGANISMS SEEN Performed at [facility], [address] [place], [place] [address]    Report Status 04/25/2023 FINAL  Final     Radiology Studies: IR Paracentesis Result Date: 04/25/2023 INDICATION: Patient with history of alcoholic cirrhosis and ascites. IR consulted for diagnostic and therapeutic paracentesis. EXAM: ULTRASOUND GUIDED DIAGNOSTIC AND THERAPEUTIC PARACENTESIS MEDICATIONS: 7 mL 1% lidocaine  COMPLICATIONS: None immediate. PROCEDURE: Informed written consent was obtained from the patient after a discussion of the risks, benefits and alternatives to treatment. A timeout was performed prior to the initiation of the procedure. Initial ultrasound scanning demonstrates a moderate amount of ascites within the right lower abdominal quadrant. The right lower abdomen was prepped and draped in the usual sterile fashion. 1% lidocaine  was used for local anesthesia. Following this, a 19 gauge, 7-cm, Yueh catheter was introduced. An ultrasound image was  saved for documentation purposes. The paracentesis was performed. The catheter was removed and a dressing was applied. The patient  tolerated the procedure well without immediate post procedural complication. A total of approximately 2 liters of clear yellow fluid was removed. Samples were sent to the laboratory as requested by the clinical team. IMPRESSION: Successful ultrasound-guided paracentesis yielding 2 liters liters of peritoneal fluid. Performed by: Wyatt Pommier, PA-C Electronically Signed   By: Myrlene Asper D.O.   On: 04/25/2023 13:28     Kathlen Para, MD, PhD [facility]  Between 7 am - 7 pm I am available, please contact me via Amion (for emergencies) or Securechat (non urgent messages)  Between 7 pm - 7 am I am not available, please contact night coverage MD/APP via Amion

## 2023-04-26 NOTE — TOC CM/SW Note (Signed)
 Transition of Care [facility]) - Inpatient Brief Assessment   Patient Details  Name: Carla Leonard MRN: [id number] Date of Birth: March 13, 1955  [facility]) CM/SW Contact:    Tom-Pischke, Angelique Ken, RN Phone Number: 04/26/2023, 2:36 PM   Clinical Narrative:  Patient presented to the ED with Shortness Of Breath, Abdominal swelling and bilat LE swelling. Admitted with Decompensated Cirrhosis. Patient has hx of Alcoholic Cirrhosis, CKD III, DVT, Seizure Disorder, HTN, Anxiety, Drug Overdose, Adrenal Sufficiency and Depression.  Patient underwent Paracentesis yesterday 04/25/23 by IR.   Patient's Hgb today at 6.8, 1U PRBC transfused. Has scheduled Doppler to LE to r/o DVT.    From home with daughter and grandchildren. Modified independent, has a cane, walker, w/c and shower seat at home.  PCP is [place], Arzella Laurence, NP and uses [employer] on [address] in [place] ans [employer] order delivery.   Awaiting PT/OT eval for disposition.  Patient not Medically ready for discharge.  CM will continue to follow as patient progresses with care towards discharge.            Transition of Care Asessment: Insurance and Status: Insurance coverage has been reviewed Patient has primary care physician: Yes Home environment has been reviewed: Yes Prior level of function:: Modified Independent Prior/Current Home Services: No current home services Social Drivers of Health Review: SDOH reviewed no interventions necessary Readmission risk has been reviewed: Yes Transition of care needs: no transition of care needs at this time

## 2023-04-26 NOTE — Progress Notes (Signed)
 PT Cancellation Note  Patient Details Name: Carla Leonard MRN: [id number] DOB: 01/10/1955   Cancelled Treatment:    Reason Eval/Treat Not Completed: Other (comment) PT orders received, chart reviewed. Awaiting doppler results to r/o LE DVT. Will f/u as able.  Emaline Handsome, PT, DPT 04/26/23, 2:06 PM   Venetta Gill 04/26/2023, 2:05 PM

## 2023-04-26 NOTE — Progress Notes (Signed)
 Bilateral lower extremity venous duplex has been completed.  Results can be found in chart review under CV Proc.  04/26/2023 3:24 PM  Ria Chad, RVT.

## 2023-04-27 ENCOUNTER — Telehealth (HOSPITAL_COMMUNITY): Payer: Self-pay | Admitting: Pharmacy Technician

## 2023-04-27 ENCOUNTER — Other Ambulatory Visit (HOSPITAL_COMMUNITY): Payer: Self-pay

## 2023-04-27 DIAGNOSIS — K7290 Hepatic failure, unspecified without coma: Secondary | ICD-10-CM | POA: Diagnosis not present

## 2023-04-27 DIAGNOSIS — K7460 Unspecified cirrhosis of liver: Secondary | ICD-10-CM | POA: Diagnosis not present

## 2023-04-27 LAB — PHOSPHORUS: Phosphorus: 1.6 mg/dL — ABNORMAL LOW (ref 2.5–4.6)

## 2023-04-27 LAB — COMPREHENSIVE METABOLIC PANEL WITH GFR
ALT: 14 U/L (ref 0–44)
AST: 25 U/L (ref 15–41)
Albumin: 2.8 g/dL — ABNORMAL LOW (ref 3.5–5.0)
Alkaline Phosphatase: 101 U/L (ref 38–126)
Anion gap: 10 (ref 5–15)
BUN: 18 mg/dL (ref 8–23)
CO2: 22 mmol/L (ref 22–32)
Calcium: 8.2 mg/dL — ABNORMAL LOW (ref 8.9–10.3)
Chloride: 106 mmol/L (ref 98–111)
Creatinine, Ser: 1.81 mg/dL — ABNORMAL HIGH (ref 0.44–1.00)
GFR, Estimated: 30 mL/min — ABNORMAL LOW (ref >60)
Glucose, Bld: 94 mg/dL (ref 70–99)
Potassium: 3.5 mmol/L (ref 3.5–5.1)
Sodium: 138 mmol/L (ref 135–145)
Total Bilirubin: 1.9 mg/dL — ABNORMAL HIGH (ref 0.0–1.2)
Total Protein: 5.4 g/dL — ABNORMAL LOW (ref 6.5–8.1)

## 2023-04-27 LAB — CBC
HCT: 22 % — ABNORMAL LOW (ref 36.0–46.0)
HCT: 21.9 % — ABNORMAL LOW (ref 36.0–46.0)
Hemoglobin: 7.1 g/dL — ABNORMAL LOW (ref 12.0–15.0)
Hemoglobin: 7 g/dL — ABNORMAL LOW (ref 12.0–15.0)
MCH: 29.8 pg (ref 26.0–34.0)
MCH: 29.4 pg (ref 26.0–34.0)
MCHC: 32.3 g/dL (ref 30.0–36.0)
MCHC: 32 g/dL (ref 30.0–36.0)
MCV: 92.4 fL (ref 80.0–100.0)
MCV: 92 fL (ref 80.0–100.0)
Platelets: 104 10*3/uL — ABNORMAL LOW (ref 150–400)
Platelets: 102 10*3/uL — ABNORMAL LOW (ref 150–400)
RBC: 2.38 MIL/uL — ABNORMAL LOW (ref 3.87–5.11)
RBC: 2.38 MIL/uL — ABNORMAL LOW (ref 3.87–5.11)
RDW: 14.8 % (ref 11.5–15.5)
RDW: 14.9 % (ref 11.5–15.5)
WBC: 3.7 10*3/uL — ABNORMAL LOW (ref 4.0–10.5)
WBC: 3.7 10*3/uL — ABNORMAL LOW (ref 4.0–10.5)
nRBC: 0 % (ref 0.0–0.2)
nRBC: 0 % (ref 0.0–0.2)

## 2023-04-27 LAB — HEPARIN LEVEL (UNFRACTIONATED)
Heparin Unfractionated: 0.1 [IU]/mL — ABNORMAL LOW (ref 0.30–0.70)
Heparin Unfractionated: 0.25 [IU]/mL — ABNORMAL LOW (ref 0.30–0.70)
Heparin Unfractionated: 0.42 [IU]/mL (ref 0.30–0.70)

## 2023-04-27 LAB — MAGNESIUM: Magnesium: 2.2 mg/dL (ref 1.7–2.4)

## 2023-04-27 LAB — PREPARE RBC (CROSSMATCH)

## 2023-04-27 LAB — UREA NITROGEN, URINE: Urea Nitrogen, Ur: 483 mg/dL

## 2023-04-27 LAB — HEMOGLOBIN AND HEMATOCRIT, BLOOD
HCT: 25 % — ABNORMAL LOW (ref 36.0–46.0)
Hemoglobin: 8.1 g/dL — ABNORMAL LOW (ref 12.0–15.0)

## 2023-04-27 MED ORDER — HEPARIN (PORCINE) 25000 UT/250ML-% IV SOLN
1200.0000 [IU]/h | INTRAVENOUS | Status: AC
  Administered 2023-04-27 – 2023-04-30 (×5): 1200 [IU]/h via INTRAVENOUS
  Filled 2023-04-27 (×4): qty 250

## 2023-04-27 MED ORDER — SODIUM CHLORIDE 0.9% IV SOLUTION: Freq: Once | INTRAVENOUS | Status: AC

## 2023-04-27 MED ORDER — SUCRALFATE 1 GM/10ML PO SUSP
1.0000 g | Freq: Two times a day (BID) | ORAL | Status: DC
  Administered 2023-04-27 – 2023-05-02 (×11): 1 g via ORAL
  Filled 2023-04-27 (×10): qty 10

## 2023-04-27 MED ORDER — POTASSIUM PHOSPHATES 15 MMOLE/5ML IV SOLN
15.0000 mmol | Freq: Once | INTRAVENOUS | Status: AC
  Administered 2023-04-27: 15 mmol via INTRAVENOUS
  Filled 2023-04-27: qty 5

## 2023-04-27 MED ORDER — CARVEDILOL 3.125 MG PO TABS
3.1250 mg | ORAL_TABLET | Freq: Two times a day (BID) | ORAL | Status: DC
  Administered 2023-04-27 – 2023-05-02 (×9): 3.125 mg via ORAL
  Filled 2023-04-27 (×11): qty 1

## 2023-04-27 NOTE — Progress Notes (Signed)
 PHARMACY - ANTICOAGULATION CONSULT NOTE  Pharmacy Consult for Heparin  Indication: DVT  Allergies  Allergen Reactions   Elemental Sulfur Anaphylaxis   Sulfa Antibiotics Anaphylaxis   Prednisone Other (See Comments)    hallucinations   Duloxetine  Other (See Comments)    Per patient   Nsaids Other (See Comments)    Stomach pain, diarrhea   Other Other (See Comments)    Steroids: hallucinations, tolerates hydrocortisone      Patient Measurements: Height: 5\' 1"  (154.9 cm) Weight: 108.9 kg (240 lb) IBW/kg (Calculated) : 47.8 HEPARIN  DW (KG): 74.5  Vital Signs: Temp: 98.2 F (36.8 C) (04/24 1303) Temp Source: Oral (04/24 1303) BP: 126/57 (04/24 1626) Pulse Rate: 60 (04/24 1626)  Labs: Recent Labs    04/25/23 0122 04/25/23 0903 04/25/23 0907 04/26/23 0528 04/27/23 0109 04/27/23 0546 04/27/23 1042 04/27/23 1455 04/27/23 1920  HGB 7.3* 7.3*  --  6.8* 7.1* 7.0*  --  8.1*  --   HCT 22.9* 23.0*  --  21.6* 22.0* 21.9*  --  25.0*  --   PLT 130*  --   --  124* 104* 102*  --   --   --   LABPROT  --   --  16.7*  --   --   --   --   --   --   INR  --   --  1.3*  --   --   --   --   --   --   HEPARINUNFRC  --   --   --   --  0.10*  --  0.25*  --  0.42  CREATININE 2.25* 2.07*  --  1.89*  --  1.81*  --   --   --   TROPONINIHS 24*  --   --   --   --   --   --   --   --     Estimated Creatinine Clearance: 34.4 mL/min (A) (by C-G formula based on SCr of 1.81 mg/dL (H)).  Assessment:  68 yr old female to begin IV Heparin  on 04/26/23 for RLE DVT.  No heparin  bolus due to anemia per MD instructions.  Will target low therapeutic heparin  levels. Had been on Heparin  5000 units SQ q8hrs and last dose given ~6am on 4/23. Hx prior DVT in 2023, cirrhosis.  Heparin  level is therapeutic tonight (0.42) on 1200 units/hr.  Hemoglobin 6.8 >7.0 after 1 unit PRBCs given on 4/23 and another unit given today. Hemoglobin up to 8.1 this afternoon. Platelet count low at 130>124>102K. No bleeding  reported.   Goal of Therapy:  Heparin  level 0.3-0.5 units/ml Monitor platelets by anticoagulation protocol: Yes   Plan:  Continue heparin  drip at 1200 units/hr Daily heparin  level and CBC while on heparin . Monitor for signs/symptoms of bleeding. Follow up for transition to oral anticoagulation when able.  Adolphus Akin, [place] 04/27/2023,8:25 PM

## 2023-04-27 NOTE — Progress Notes (Signed)
 Pt has low Hgb 7.0, asymptomatic, no active bleeding noted. MD ordered given 1 unit of blood. Unit was given with to reaction, VS remains stable. Repeat Hgb was 8.1. Heparin  currently at 12 units.

## 2023-04-27 NOTE — Evaluation (Signed)
 Physical Therapy Evaluation Patient Details Name: Carla Leonard MRN: [id number] DOB: [birth year] Today's Date: 04/27/2023  History of Present Illness  Pt is a 68 y/o F admitted on 04/25/23 after presenting with c/o abdominal swelling & SOB x 1-2 weeks since having food poisoning. Pt is being treated for decompensated cirrhosis with ascites & AKI superimposed on CKD 3B. + RLE DVT. Pt underwent paracentesis. PMH: HTN, alcoholic cirrhosis, CKD 3, DVT, seizure disorder, anxiety, depression, adrenal sufficiency, drug overdose, insomnia  Clinical Impression  Pt admitted with above diagnosis. Pt from home with family and is usually independent with rollator though she reports that since she and her granddaughter had food poisoning she has become progressively weaker to the point that she could not stand up on day of presentation.  Eval limited as pt with decreased hgb and receiving blood andf awaiting updated hep level. Pt given supervision coming to EOB, was dizzy in sitting and standing. Stood with min A but did not progress ambulation with dizziness and current medical issues. Will follow acutely but do no anticipate her needing PT at d/c. Pt currently with functional limitations due to the deficits listed below (see PT Problem List). Pt will benefit from acute skilled PT to increase their independence and safety with mobility to allow discharge.           If plan is discharge home, recommend the following: Help with stairs or ramp for entrance;Assist for transportation;Assistance with cooking/housework   Can travel by private vehicle        Equipment Recommendations None recommended by PT  Recommendations for Other Services       Functional Status Assessment Patient has had a recent decline in their functional status and demonstrates the ability to make significant improvements in function in a reasonable and predictable amount of time.     Precautions / Restrictions Precautions Precautions:  Fall Recall of Precautions/Restrictions: Intact Restrictions Weight Bearing Restrictions Per Provider Order: No Other Position/Activity Restrictions: pt currently receiving PRBC      Mobility  Bed Mobility Overal bed mobility: Needs Assistance Bed Mobility: Supine to Sit, Sit to Supine     Supine to sit: Contact guard Sit to supine: Contact guard assist   General bed mobility comments: CGA for safety, pt takes increased time to come to EOB but no physical assist needed    Transfers Overall transfer level: Needs assistance Equipment used: 1 person hand held assist Transfers: Sit to/from Stand Sit to Stand: Min assist           General transfer comment: min A to steady, pt mildly dizzy    Ambulation/Gait Ambulation/Gait assistance: Min assist Gait Distance (Feet): 2 Feet Assistive device: 1 person hand held assist Gait Pattern/deviations: Step-to pattern       General Gait Details: sidesteps EOB, limited mobility with low hgb and awaiting new hep level  Stairs            Wheelchair Mobility     Tilt Bed    Modified Rankin (Stroke Patients Only)       Balance Overall balance assessment: Needs assistance Sitting-balance support: Feet supported, No upper extremity supported Sitting balance-Leahy Scale: Fair Sitting balance - Comments: dizzy   Standing balance support: Single extremity supported, During functional activity Standing balance-Leahy Scale: Poor Standing balance comment: unsteady in standing, needs UE support  Pertinent Vitals/Pain Pain Assessment Pain Assessment: Faces Faces Pain Scale: Hurts a little bit Pain Location: arms sore from needle sticks, generalized Pain Descriptors / Indicators: Sore Pain Intervention(s): Limited activity within patient's tolerance, Monitored during session    Home Living Family/patient expects to be discharged to:: Private residence Living Arrangements:  Children;Other relatives Available Help at Discharge: Family;Neighbor;Friend(s);Available 24 hours/day Type of Home: Apartment Home Access: Stairs to enter Entrance Stairs-Rails: Left Entrance Stairs-Number of Steps: 2   Home Layout: One level Home Equipment: Cane - single point;Rollator (4 wheels) Additional Comments: family assists as needed    Prior Function Prior Level of Function : Independent/Modified Independent             Mobility Comments: uses rollator ADLs Comments: daughter assists as needed     Extremity/Trunk Assessment   Upper Extremity Assessment Upper Extremity Assessment: Generalized weakness    Lower Extremity Assessment Lower Extremity Assessment: Generalized weakness    Cervical / Trunk Assessment Cervical / Trunk Assessment: Normal  Communication   Communication Communication: No apparent difficulties    Cognition Arousal: Alert Behavior During Therapy: WFL for tasks assessed/performed   PT - Cognitive impairments: No apparent impairments                         Following commands: Intact       Cueing Cueing Techniques: Verbal cues     General Comments General comments (skin integrity, edema, etc.): VSS    Exercises     Assessment/Plan    PT Assessment Patient needs continued PT services  PT Problem List Decreased strength;Decreased activity tolerance;Decreased balance;Decreased mobility;Pain       PT Treatment Interventions DME instruction;Gait training;Stair training;Functional mobility training;Therapeutic activities;Therapeutic exercise;Balance training;Patient/family education    PT Goals (Current goals can be found in the Care Plan section)  Acute Rehab PT Goals Patient Stated Goal: return home PT Goal Formulation: With patient Time For Goal Achievement: 05/11/23 Potential to Achieve Goals: Good    Frequency Min 2X/week     Co-evaluation               AM-PAC PT "6 Clicks" Mobility  Outcome  Measure Help needed turning from your back to your side while in a flat bed without using bedrails?: None Help needed moving from lying on your back to sitting on the side of a flat bed without using bedrails?: None Help needed moving to and from a bed to a chair (including a wheelchair)?: A Little Help needed standing up from a chair using your arms (e.g., wheelchair or bedside chair)?: A Little Help needed to walk in hospital room?: A Little Help needed climbing 3-5 steps with a railing? : A Lot 6 Click Score: 19    End of Session Equipment Utilized During Treatment: Gait belt Activity Tolerance: Patient tolerated treatment well Patient left: in bed;with call bell/phone within reach;with bed alarm set Nurse Communication: Mobility status PT Visit Diagnosis: Unsteadiness on feet (R26.81);Difficulty in walking, not elsewhere classified (R26.2);Muscle weakness (generalized) (M62.81)    Time: 8295-6213 PT Time Calculation (min) (ACUTE ONLY): 17 min   Charges:   PT Evaluation $PT Eval Moderate Complexity: 1 Mod   PT General Charges $$ ACUTE PT VISIT: 1 Visit         Amey Ka, PT  Acute Rehab Services Secure chat preferred Office [phone]   Deloris Fetters Hezakiah Champeau 04/27/2023, 12:58 PM

## 2023-04-27 NOTE — Telephone Encounter (Signed)
 Patient Product/process development scientist completed.    The patient is insured through [employer]. Patient has Medicare and is not eligible for a copay card, but may be able to apply for patient assistance or Medicare RX Payment Plan (Patient Must reach out to their plan, if eligible for payment plan), if available.    Ran test claim for Eliquis 5 mg and the current 30 day co-pay is $0.00.  Ran test claim for Xarelto 20 mg and the current 30 day co-pay is $0.00.  Ran test claim for warfarin (Coumadin ) 5 mg and the current 30 day co-pay is $0.00.  This test claim was processed through [facility]- copay amounts may vary at other pharmacies due to pharmacy/plan contracts, or as the patient moves through the different stages of their insurance plan.     Morgan Arab, CPHT Pharmacy Technician III Certified Patient Advocate [facility] Patient Advocate Team Direct Number: [phone]  Fax: [phone]

## 2023-04-27 NOTE — Progress Notes (Signed)
 PHARMACY - ANTICOAGULATION CONSULT NOTE  Pharmacy Consult for Heparin  Indication: DVT  Allergies  Allergen Reactions   Elemental Sulfur Anaphylaxis   Sulfa Antibiotics Anaphylaxis   Prednisone Other (See Comments)    hallucinations   Duloxetine  Other (See Comments)    Per patient   Nsaids Other (See Comments)    Stomach pain, diarrhea   Other Other (See Comments)    Steroids: hallucinations, tolerates hydrocortisone      Patient Measurements: Height: 5\' 1"  (154.9 cm) Weight: 108.9 kg (240 lb) IBW/kg (Calculated) : 47.8 HEPARIN  DW (KG): 74.5  Vital Signs: Temp: 98.4 F (36.9 C) (04/23 2117) Temp Source: Oral (04/23 1415) BP: 144/51 (04/23 2117) Pulse Rate: 69 (04/23 2117)  Labs: Recent Labs    04/25/23 0122 04/25/23 0903 04/25/23 0907 04/26/23 0528 04/27/23 0109  HGB 7.3* 7.3*  --  6.8* 7.1*  HCT 22.9* 23.0*  --  21.6* 22.0*  PLT 130*  --   --  124* 104*  LABPROT  --   --  16.7*  --   --   INR  --   --  1.3*  --   --   HEPARINUNFRC  --   --   --   --  0.10*  CREATININE 2.25* 2.07*  --  1.89*  --   TROPONINIHS 24*  --   --   --   --     Estimated Creatinine Clearance: 32.9 mL/min (A) (by C-G formula based on SCr of 1.89 mg/dL (H)).   Assessment:  68 yr old female to begin IV Heparin  for RLE DVT.  No heparin  bolus due to anemia per MD instructions.  Will target low therapeutic heparin  levels.  Hemoglobin 6.8 this morning and 1 unit PRBCs given.  Platelet count low at 130>124K. No bleeding noted.  Hx prior DVT in 2023, cirrhosis.  AM: heparin  level subtherapeutic on 900 units/hr. Per RN, no signs/symptoms of bleeding or issues with the heparin  infusion running continuously.  Goal of Therapy:  Heparin  level 0.3-0.5 units/ml Monitor platelets by anticoagulation protocol: Yes   Plan:  Increase heparin  drip to 1100 units/hr  8h heparin  level Daily heparin  level and CBC while on heparin . Monitor for signs/symptoms of bleeding.  Young Hensen, PharmD. Clinical  Pharmacist 04/27/2023 1:49 AM

## 2023-04-27 NOTE — Progress Notes (Signed)
 PHARMACY - ANTICOAGULATION CONSULT NOTE  Pharmacy Consult for Heparin  Indication: DVT  Allergies  Allergen Reactions   Elemental Sulfur Anaphylaxis   Sulfa Antibiotics Anaphylaxis   Prednisone Other (See Comments)    hallucinations   Duloxetine  Other (See Comments)    Per patient   Nsaids Other (See Comments)    Stomach pain, diarrhea   Other Other (See Comments)    Steroids: hallucinations, tolerates hydrocortisone      Patient Measurements: Height: 5\' 1"  (154.9 cm) Weight: 108.9 kg (240 lb) IBW/kg (Calculated) : 47.8 HEPARIN  DW (KG): 74.5  Vital Signs: Temp: 98.1 F (36.7 C) (04/24 1043) Temp Source: Oral (04/24 1043) BP: 119/55 (04/24 1043) Pulse Rate: 69 (04/24 1043)  Labs: Recent Labs    04/25/23 0122 04/25/23 0903 04/25/23 0907 04/26/23 0528 04/27/23 0109 04/27/23 0546 04/27/23 1042  HGB 7.3* 7.3*  --  6.8* 7.1* 7.0*  --   HCT 22.9* 23.0*  --  21.6* 22.0* 21.9*  --   PLT 130*  --   --  124* 104* 102*  --   LABPROT  --   --  16.7*  --   --   --   --   INR  --   --  1.3*  --   --   --   --   HEPARINUNFRC  --   --   --   --  0.10*  --  0.25*  CREATININE 2.25* 2.07*  --  1.89*  --  1.81*  --   TROPONINIHS 24*  --   --   --   --   --   --     Estimated Creatinine Clearance: 34.4 mL/min (A) (by C-G formula based on SCr of 1.81 mg/dL (H)).   Medical History: Past Medical History:  Diagnosis Date   Allergy    "spring time",no medications   Anxiety    Arthritis    back,knee,hands   Asthma    "as a child"   Depression    History of kidney stones    Hypertension    pt.denies updated 02/07/22   Insomnia    Seizures (HCC)    last 2 1/2 years ago    Assessment:  68 yr old female to begin IV Heparin  for RLE DVT.  No heparin  bolus due to anemia per MD instructions.  Will target low therapeutic heparin  levels.  Update 04/27/23:   8 hr Heparin  level is 0.25, subtherapeutic, although did trend upward after heparin  rate increased to 1100 units/hr earlier this  morning.  Hgb 7.0 and pltc 102K.  Transfusion of 1 unit PRBCs ordered per MD.  This I heparin  level was drawn ~ 20 minutes after blood transfusion started.  No bleeding reported.  She has a history of prior DVT in 2023, cirrhosis.  Goal of Therapy:  Heparin  level 0.3-0.5 units/ml Monitor platelets by anticoagulation protocol: Yes   Plan:  Increase heparin  drip to 1200 units/hr  Check 6 hr Heparin  level.  Daily heparin  level and CBC while on heparin . Monitor for signs/symptoms of bleeding. F/u plan for oral anticoagulation plan.  30 day copay check done for   Eliquis $0.00, xarelto $0.00 and warfarin $0.00.   Alisa Irish, RPh Clinical Pharmacist 04/27/2023,11:36 AM [phone] until 3:30PM Then please check AMION for all [facility] Pharmacy phone numbers After 10:00 PM, call Main Pharmacy [phone]

## 2023-04-27 NOTE — Progress Notes (Addendum)
   04/27/23 1659  Mobility  Activity Stood at bedside  Level of Assistance Standby assist, set-up cues, supervision of patient - no hands on  Assistive Device  (Bedside Table)  Range of Motion/Exercises Right leg;Left leg;Active  Activity Response Tolerated fair  Mobility Referral Yes  Mobility visit 1 Mobility  Mobility Specialist Start Time (ACUTE ONLY) 1659  Mobility Specialist Stop Time (ACUTE ONLY) 1705  Mobility Specialist Time Calculation (min) (ACUTE ONLY) 6 min   Mobility Specialist: Progress Note  Pt agreeable to mobility session - received in EOB. Pt was asymptomatic throughout session with no complaints. Returned to EOB with all needs met - call bell within reach. Further mobility deferred by pt, denied transfer. Pt eating dinner.   BLE Exercises: STS x5, marches x10, extensions x10, toe taps x5.  Carla Leonard, BS Mobility Specialist Please contact via SecureChat or  Rehab office at [phone].

## 2023-04-27 NOTE — Progress Notes (Signed)
 PROGRESS NOTE  Carla Leonard WJX:[id number] DOB: 1955-03-30 DOA: 04/25/2023 PCP: Sharma Dears, NP   LOS: 2 days   Brief Narrative / Interim history: 68 year old female with HTN, alcoholic cirrhosis, CKD 3, prior DVT, nonepileptic spells, adrenal insufficiency, who comes to the hospital with complaints of progressive abdominal swelling, similar to a prior episode 2 years ago when she required paracentesis.  She has been taking more furosemide  at home but that did not help.  Also reports recent episode of food poisoning with nausea and vomiting, this is now resolved.  Subjective / 24h Interval events: Overall she feels much better.  Assesement and Plan: Principal Problem:   Decompensated cirrhosis (HCC) Active Problems:   Acute kidney injury superimposed on chronic kidney disease (HCC)   Elevated troponin   Hypokalemia   Normocytic anemia   Thrombocytopenia (HCC)   Lower extremity edema  Principal problem Anemia, acute on chronic -likely in the setting of underlying liver disease, she has no bleeding.  She is well aware of presentation of gastrointestinal bleed and denies any bright red blood or melena.  She has been watching this very carefully.  Fecal occult was negative as well.  Anemia panel fairly unremarkable - Hemoglobin overall stable in the 7 range, transfuse 1 unit of packed red blood cells, for now a total of 2 units.  Again she denies any bleeding  Active problems Acute kidney injury on chronic kidney disease stage IIIb -baseline creatinine around 1.4, it was 2.2 on admission.  She received IV fluids, creatinine gradually improving, and has stabilized around 1.8  Liver cirrhosis, decompensated-with ascites.  Underwent paracentesis on 4/22 with 2 L of fluid removed.  Fluid analysis with negative Gram stain, no growth, but do not see cell count - She is status post albumin  1/23  Hypokalemia-due to recent illness with nausea, vomiting, poor p.o. intake.  Will give  additional potassium  Hypophosphatemia-replace  Anxiety -patient reports increased anxiety recently due to her fluid overload.  Tells me she takes Ativan  at home, resume that here.  Much better  ?  Dysphagia -patient reports that every time she tries to eat feels like her throat is getting swollen.  Denies food being stuck.  She says she has had this in the past when she feels very anxious.  Dysphagia is completely resolved with treatment of her anxiety  Elevated troponin-no chest pain, flat, not in a pattern consistent with ACS, likely demand ischemia  Adrenal insufficiency-continue midodrine , hydrocortisone   Lower extremity swelling, history of DVT-Doppler ultrasound positive for DVT.  Case was discussed with Dr. Leonia Raman with gastroenterology, she has a history of EV's as well as GAVE, however without any current active bleeding she could be placed on anticoagulation with close monitoring - Continue IV heparin  for now, will receive a second unit of blood, if hemoglobin remains stable, there is no bleeding and improvement is appropriate after transfusion we will consider transitioning to Eliquis tomorrow  Thrombocytopenia-due to liver disease, stable, no bleeding  Obesity, morbid-BMI 45.  She would benefit from weight loss   Scheduled Meds:  sodium chloride    Intravenous Once   sodium chloride    Intravenous Once   carvedilol   3.125 mg Oral BID WC   feeding supplement  237 mL Oral BID BM   ferrous sulfate   325 mg Oral Q breakfast   folic acid   1 mg Oral Daily   hydrocortisone   20 mg Oral Daily   midodrine   10 mg Oral TID WC   pantoprazole   40 mg Oral BID AC   sodium chloride  flush  3 mL Intravenous Q12H   sucralfate   1 g Oral BID WC   Continuous Infusions:  heparin  1,100 Units/hr (04/27/23 0159)   promethazine  (PHENERGAN ) injection (IM or IVPB)     PRN Meds:.fluticasone  furoate-vilanterol, LORazepam , oxyCODONE , promethazine  **OR** promethazine  (PHENERGAN ) injection (IM or IVPB),  rOPINIRole   Current Outpatient Medications  Medication Instructions   acetaminophen  (TYLENOL ) 1,500 mg, Oral, Daily PRN, Low back pain   albuterol  (VENTOLIN  HFA) 108 (90 Base) MCG/ACT inhaler 2 puffs, Inhalation, Every 6 hours PRN   b complex vitamins capsule 1 capsule, Oral, Daily   BREO ELLIPTA  100-25 MCG/ACT AEPB 1 puff, Inhalation, As needed   ciprofloxacin  (CIPRO ) 500 mg, Oral, Daily   Cranberry 500 MG CAPS 1 capsule, Oral, Daily   ferrous sulfate  325 mg, Oral, Daily with breakfast   folic acid  (FOLVITE ) 1 mg, Oral, Daily   furosemide  (LASIX ) 60 mg, Oral, Daily   hydrocortisone  (CORTEF ) 10 MG tablet Take 2 tablets (20 mg total) by mouth daily.  Do not stop it abruptly unless told by the endocrinologist.   midodrine  (PROAMATINE ) 10 mg, Oral, 3 times daily with meals   pantoprazole  (PROTONIX ) 40 mg, Oral, 2 times daily before meals   potassium chloride  SA (KLOR-CON  M) 20 MEQ tablet 20 mEq, Oral, 2 times daily   promethazine  (PHENERGAN ) 12.5 mg, Oral, Every 6 hours PRN   rOPINIRole  (REQUIP ) 0.25 mg, Daily at bedtime   spironolactone  (ALDACTONE ) 150 mg, Oral, 3 times daily   sucralfate  (CARAFATE ) 1 g, Oral, 2 times daily   Vitamin D3 1,000 Units, Oral, Daily    Diet Orders (From admission, onward)     Start     Ordered   04/25/23 0945  Diet 2 gram sodium Room service appropriate? Yes; Fluid consistency: Thin  Diet effective now       Question Answer Comment  Room service appropriate? Yes   Fluid consistency: Thin      04/25/23 0944            DVT prophylaxis: SCDs Start: 04/25/23 0825   Lab Results  Component Value Date   PLT 102 (L) 04/27/2023      Code Status: Full Code  Family Communication: No family at bedside  Status is: Inpatient Remains inpatient appropriate because: Severity of illness   Level of care: Telemetry Medical  Consultants:  None  Objective: Vitals:   04/27/23 0504 04/27/23 0745 04/27/23 1019 04/27/23 1043  BP: (!) 120/49 114/67 (!)  106/54 (!) 119/55  Pulse: 74 78 70 69  Resp: 18  16 16   Temp: 98.5 F (36.9 C) 98.3 F (36.8 C) 98.3 F (36.8 C) 98.1 F (36.7 C)  TempSrc:  Oral Oral Oral  SpO2: 99% 100% 100% 98%  Weight:      Height:        Intake/Output Summary (Last 24 hours) at 04/27/2023 1132 Last data filed at 04/27/2023 0159 Gross per 24 hour  Intake 489.79 ml  Output 350 ml  Net 139.79 ml   Wt Readings from Last 3 Encounters:  04/25/23 108.9 kg  03/25/23 110.2 kg  06/30/22 113.8 kg    Examination:  Constitutional: NAD Eyes: lids and conjunctivae normal, no scleral icterus ENMT: mmm Neck: normal, supple Respiratory: clear to auscultation bilaterally, no wheezing, no crackles.  Cardiovascular: Regular rate and rhythm, no murmurs / rubs / gallops. No LE edema. Abdomen: soft, no distention, no tenderness. Bowel sounds positive.   Data Reviewed: I  have independently reviewed following labs and imaging studies   CBC Recent Labs  Lab 04/25/23 0122 04/25/23 0903 04/26/23 0528 04/27/23 0109 04/27/23 0546  WBC 4.7  --  4.6 3.7* 3.7*  HGB 7.3* 7.3* 6.8* 7.1* 7.0*  HCT 22.9* 23.0* 21.6* 22.0* 21.9*  PLT 130*  --  124* 104* 102*  MCV 95.4  --  93.9 92.4 92.0  MCH 30.4  --  29.6 29.8 29.4  MCHC 31.9  --  31.5 32.3 32.0  RDW 14.4  --  14.2 14.8 14.9  LYMPHSABS 0.6*  --   --   --   --   MONOABS 0.5  --   --   --   --   EOSABS 0.3  --   --   --   --   BASOSABS 0.0  --   --   --   --     Recent Labs  Lab 04/25/23 0122 04/25/23 0903 04/25/23 0907 04/26/23 0528 04/27/23 0546  NA 138 140  --  138 138  K 2.7* 3.1*  --  3.1* 3.5  CL 101 106  --  104 106  CO2 26 23  --  25 22  GLUCOSE 100* 89  --  94 94  BUN 25* 24*  --  22 18  CREATININE 2.25* 2.07*  --  1.89* 1.81*  CALCIUM 8.1* 8.0*  --  7.8* 8.2*  AST 28  --   --  25 25  ALT 15  --   --  16 14  ALKPHOS 120  --   --  112 101  BILITOT 1.2  --   --  1.0 1.9*  ALBUMIN  2.1*  --   --  1.9* 2.8*  MG 1.8  --   --   --  2.2  INR  --   --   1.3*  --   --   BNP 353.1*  --   --   --   --     ------------------------------------------------------------------------------------------------------------------ No results for input(s): "CHOL", "HDL", "LDLCALC", "TRIG", "CHOLHDL", "LDLDIRECT" in the last 72 hours.  No results found for: "HGBA1C" ------------------------------------------------------------------------------------------------------------------ No results for input(s): "TSH", "T4TOTAL", "T3FREE", "THYROIDAB" in the last 72 hours.  Invalid input(s): "FREET3"  Cardiac Enzymes No results for input(s): "CKMB", "TROPONINI", "MYOGLOBIN" in the last 168 hours.  Invalid input(s): "CK" ------------------------------------------------------------------------------------------------------------------    Component Value Date/Time   BNP 353.1 (H) 04/25/2023 0122    CBG: No results for input(s): "GLUCAP" in the last 168 hours.  Recent Results (from the past 240 hours)  Culture, body fluid w Gram Stain-bottle     Status: None (Preliminary result)   Collection Time: 04/25/23 12:10 PM   Specimen: Pleura  Result Value Ref Range Status   Specimen Description PLEURAL  Final   Special Requests NONE  Final   Culture   Final    NO GROWTH 2 DAYS Performed at [facility], [address], [place], [place] [address]    Report Status PENDING  Incomplete  Gram stain     Status: None   Collection Time: 04/25/23 12:10 PM   Specimen: Pleura  Result Value Ref Range Status   Specimen Description PLEURAL  Final   Special Requests NONE  Final   Gram Stain   Final    NO WBC SEEN NO ORGANISMS SEEN Performed at [facility], [address] [place], [place] [address]    Report Status 04/25/2023 FINAL  Final  Radiology Studies: VAS US  LOWER EXTREMITY VENOUS (DVT) Result Date: 04/26/2023 x  Lower Venous DVT Study Patient Name:  Carla Leonard  Date of Exam:   04/26/2023 Medical Rec #: [id number]         Accession #:     [id number] Date of Birth: [birth year]         Patient Gender: F Patient Age:   34 years Exam Location:  [facility] Procedure:      VAS US  LOWER EXTREMITY VENOUS (DVT) Referring Phys: Manny Sees --------------------------------------------------------------------------------  Indications: Pain, Swelling, Edema, and H/O DVT.  Comparison Study: Previous study on 5.6.2023 Performing Technologist: Ria Chad  Examination Guidelines: A complete evaluation includes B-mode imaging, spectral Doppler, color Doppler, and power Doppler as needed of all accessible portions of each vessel. Bilateral testing is considered an integral part of a complete examination. Limited examinations for reoccurring indications may be performed as noted. The reflux portion of the exam is performed with the patient in reverse Trendelenburg.  +---------+---------------+---------+-----------+----------+-------------------+ RIGHT    CompressibilityPhasicitySpontaneityPropertiesThrombus Aging      +---------+---------------+---------+-----------+----------+-------------------+ CFV      Partial        Yes      Yes                                      +---------+---------------+---------+-----------+----------+-------------------+ SFJ      Partial        Yes      Yes                                      +---------+---------------+---------+-----------+----------+-------------------+ FV Prox  Partial        Yes      Yes                                      +---------+---------------+---------+-----------+----------+-------------------+ FV Mid   Partial        Yes      Yes                                      +---------+---------------+---------+-----------+----------+-------------------+ FV DistalPartial        Yes      Yes                                      +---------+---------------+---------+-----------+----------+-------------------+ PFV      Full                                                              +---------+---------------+---------+-----------+----------+-------------------+ POP      Partial        Yes      Yes                                      +---------+---------------+---------+-----------+----------+-------------------+  PTV                                                   Not well                                                                  visualized, patent                                                        by color.           +---------+---------------+---------+-----------+----------+-------------------+ PERO                                                  Not well                                                                  visualized, patent                                                        by color.           +---------+---------------+---------+-----------+----------+-------------------+ EIV                     Yes      Yes                                      +---------+---------------+---------+-----------+----------+-------------------+   +---------+---------------+---------+-----------+----------+------------------+ LEFT     CompressibilityPhasicitySpontaneityPropertiesThrombus Aging     +---------+---------------+---------+-----------+----------+------------------+ CFV      Full           Yes      Yes                                     +---------+---------------+---------+-----------+----------+------------------+ SFJ      Full           Yes      Yes                                     +---------+---------------+---------+-----------+----------+------------------+ FV Prox  Full                                                            +---------+---------------+---------+-----------+----------+------------------+  FV Mid   Full                                                             +---------+---------------+---------+-----------+----------+------------------+ FV DistalFull                                                            +---------+---------------+---------+-----------+----------+------------------+ PFV      Full                                                            +---------+---------------+---------+-----------+----------+------------------+ POP      Partial        Yes      Yes                  Fibrous stranding. +---------+---------------+---------+-----------+----------+------------------+ PTV      Full                                                            +---------+---------------+---------+-----------+----------+------------------+ PERO     Full                                                            +---------+---------------+---------+-----------+----------+------------------+ Fibrous stranding note din the left popliteal veins.    Summary: RIGHT: - Findings consistent with acute deep vein thrombosis involving the right common femoral vein, SF junction, right femoral vein, and right popliteal vein.   LEFT: - There is no evidence of deep vein thrombosis in the lower extremity.  - Fibrous stranding note din the left popliteal veins.  *See table(s) above for measurements and observations. Electronically signed by Genny Kid MD on 04/26/2023 at 4:40:39 PM.    Final      Kathlen Para, MD, PhD [facility]  Between 7 am - 7 pm I am available, please contact me via Amion (for emergencies) or Securechat (non urgent messages)  Between 7 pm - 7 am I am not available, please contact night coverage MD/APP via Amion

## 2023-04-28 DIAGNOSIS — K7290 Hepatic failure, unspecified without coma: Secondary | ICD-10-CM | POA: Diagnosis not present

## 2023-04-28 DIAGNOSIS — K7460 Unspecified cirrhosis of liver: Secondary | ICD-10-CM | POA: Diagnosis not present

## 2023-04-28 LAB — TYPE AND SCREEN
ABO/RH(D): O POS
Antibody Screen: NEGATIVE
Unit division: 0
Unit division: 0

## 2023-04-28 LAB — CBC
HCT: 23.8 % — ABNORMAL LOW (ref 36.0–46.0)
Hemoglobin: 7.5 g/dL — ABNORMAL LOW (ref 12.0–15.0)
MCH: 29.1 pg (ref 26.0–34.0)
MCHC: 31.5 g/dL (ref 30.0–36.0)
MCV: 92.2 fL (ref 80.0–100.0)
Platelets: 109 10*3/uL — ABNORMAL LOW (ref 150–400)
RBC: 2.58 MIL/uL — ABNORMAL LOW (ref 3.87–5.11)
RDW: 15.8 % — ABNORMAL HIGH (ref 11.5–15.5)
WBC: 4.2 10*3/uL (ref 4.0–10.5)
nRBC: 0 % (ref 0.0–0.2)

## 2023-04-28 LAB — PHOSPHORUS: Phosphorus: 2.2 mg/dL — ABNORMAL LOW (ref 2.5–4.6)

## 2023-04-28 LAB — COMPREHENSIVE METABOLIC PANEL WITH GFR
ALT: 12 U/L (ref 0–44)
AST: 23 U/L (ref 15–41)
Albumin: 2.4 g/dL — ABNORMAL LOW (ref 3.5–5.0)
Alkaline Phosphatase: 97 U/L (ref 38–126)
Anion gap: 8 (ref 5–15)
BUN: 16 mg/dL (ref 8–23)
CO2: 23 mmol/L (ref 22–32)
Calcium: 8.1 mg/dL — ABNORMAL LOW (ref 8.9–10.3)
Chloride: 106 mmol/L (ref 98–111)
Creatinine, Ser: 1.74 mg/dL — ABNORMAL HIGH (ref 0.44–1.00)
GFR, Estimated: 32 mL/min — ABNORMAL LOW (ref >60)
Glucose, Bld: 94 mg/dL (ref 70–99)
Potassium: 3.8 mmol/L (ref 3.5–5.1)
Sodium: 137 mmol/L (ref 135–145)
Total Bilirubin: 1 mg/dL (ref 0.0–1.2)
Total Protein: 4.9 g/dL — ABNORMAL LOW (ref 6.5–8.1)

## 2023-04-28 LAB — BPAM RBC
Blood Product Expiration Date: 202505232359
Blood Product Expiration Date: 202505152359
ISSUE DATE / TIME: 202504241015
ISSUE DATE / TIME: 202504231118
Unit Type and Rh: 5100
Unit Type and Rh: 5100

## 2023-04-28 LAB — MAGNESIUM: Magnesium: 2.1 mg/dL (ref 1.7–2.4)

## 2023-04-28 LAB — HEPARIN LEVEL (UNFRACTIONATED): Heparin Unfractionated: 0.36 [IU]/mL (ref 0.30–0.70)

## 2023-04-28 LAB — OCCULT BLOOD X 1 CARD TO LAB, STOOL: Fecal Occult Bld: POSITIVE — AB

## 2023-04-28 NOTE — Progress Notes (Signed)
 PROGRESS NOTE  Carla Leonard XLK:[id number] DOB: [birth year] DOA: 04/25/2023 PCP: Sharma Dears, NP   LOS: 3 days   Brief Narrative / Interim history: 68 year old female with HTN, alcoholic cirrhosis, CKD 3, prior DVT, nonepileptic spells, adrenal insufficiency, who comes to the hospital with complaints of progressive abdominal swelling, similar to a prior episode 2 years ago when she required paracentesis.  She has been taking more furosemide  at home but that did not help.  Also reports recent episode of food poisoning with nausea and vomiting, this is now resolved.  Subjective / 24h Interval events: She feels well, denies any chest pain, denies any shortness of breath.  Worked well with physical therapy  Assesement and Plan: Principal Problem:   Decompensated cirrhosis (HCC) Active Problems:   Acute kidney injury superimposed on chronic kidney disease (HCC)   Elevated troponin   Hypokalemia   Normocytic anemia   Thrombocytopenia (HCC)   Lower extremity edema  Principal problem Anemia, acute on chronic -likely in the setting of underlying liver disease, she has no bleeding as far as she could tell.  She is well aware of presentation of gastrointestinal bleed and denies any bright red blood or melena.  She has been watching this very carefully.  - Patient was transfused a total of 2 units of packed red blood cells for an initial hemoglobin of 6.8, and a repeat hemoglobin in the next day of 7.0.  Hemoglobin improved to 8.1 last night but dipping down to 7.5 this morning.  She denies any frank bleeding, fecal occult on admission was negative but she was not on anticoagulation then.  Repeat fecal occult and continue to closely monitor.  Active problems Acute kidney injury on chronic kidney disease stage IIIb -baseline creatinine around 1.4, it was 2.2 on admission.  She received IV fluids, creatinine gradually improving, now stabilized and is 1.7 this morning  Liver cirrhosis,  decompensated-with ascites.  Underwent paracentesis on 4/22 with 2 L of fluid removed.  Fluid analysis with negative Gram stain, no growth, but do not see cell count - She is status post albumin  1/23  Hypokalemia-due to recent illness with nausea, vomiting, poor p.o. intake.  Potassium and magnesium  stable.  Hypophosphatemia-replace again today  Anxiety -patient reports increased anxiety recently due to her fluid overload.  Tells me she takes Ativan  at home, resume that here.  Much better  Elevated troponin-no chest pain, flat, not in a pattern consistent with ACS, likely demand ischemia  Adrenal insufficiency-continue midodrine , hydrocortisone   Lower extremity swelling, history of DVT-Doppler ultrasound positive for DVT.  Case was discussed with Dr. Leonia Raman with gastroenterology, she has a history of EV's as well as GAVE, however without any current active bleeding she could be placed on anticoagulation with close monitoring - Continue IV heparin  for now, received total of 2 units of pRBC as above, hemoglobin improved but somewhat lower than expected.  Repeat fecal occult.  If positive, reconsult GI  Thrombocytopenia-due to liver disease, stable, no bleeding  Obesity, morbid-BMI 45.  She would benefit from weight loss   Scheduled Meds:  carvedilol   3.125 mg Oral BID WC   feeding supplement  237 mL Oral BID BM   ferrous sulfate   325 mg Oral Q breakfast   folic acid   1 mg Oral Daily   hydrocortisone   20 mg Oral Daily   midodrine   10 mg Oral TID WC   pantoprazole   40 mg Oral BID AC   sodium chloride  flush  3 mL Intravenous  Q12H   sucralfate   1 g Oral BID WC   Continuous Infusions:  heparin  1,200 Units/hr (04/27/23 1742)   promethazine  (PHENERGAN ) injection (IM or IVPB)     PRN Meds:.fluticasone  furoate-vilanterol, LORazepam , oxyCODONE , promethazine  **OR** promethazine  (PHENERGAN ) injection (IM or IVPB), rOPINIRole   Current Outpatient Medications  Medication Instructions    acetaminophen  (TYLENOL ) 1,500 mg, Oral, Daily PRN, Low back pain   albuterol  (VENTOLIN  HFA) 108 (90 Base) MCG/ACT inhaler 2 puffs, Inhalation, Every 6 hours PRN   b complex vitamins capsule 1 capsule, Oral, Daily   BREO ELLIPTA  100-25 MCG/ACT AEPB 1 puff, Inhalation, As needed   ciprofloxacin  (CIPRO ) 500 mg, Oral, Daily   Cranberry 500 MG CAPS 1 capsule, Oral, Daily   ferrous sulfate  325 mg, Oral, Daily with breakfast   folic acid  (FOLVITE ) 1 mg, Oral, Daily   furosemide  (LASIX ) 60 mg, Oral, Daily   hydrocortisone  (CORTEF ) 10 MG tablet Take 2 tablets (20 mg total) by mouth daily.  Do not stop it abruptly unless told by the endocrinologist.   midodrine  (PROAMATINE ) 10 mg, Oral, 3 times daily with meals   pantoprazole  (PROTONIX ) 40 mg, Oral, 2 times daily before meals   potassium chloride  SA (KLOR-CON  M) 20 MEQ tablet 20 mEq, Oral, 2 times daily   promethazine  (PHENERGAN ) 12.5 mg, Oral, Every 6 hours PRN   rOPINIRole  (REQUIP ) 0.25 mg, Daily at bedtime   spironolactone  (ALDACTONE ) 150 mg, Oral, 3 times daily   sucralfate  (CARAFATE ) 1 g, Oral, 2 times daily   Vitamin D3 1,000 Units, Oral, Daily    Diet Orders (From admission, onward)     Start     Ordered   04/25/23 0945  Diet 2 gram sodium Room service appropriate? Yes; Fluid consistency: Thin  Diet effective now       Question Answer Comment  Room service appropriate? Yes   Fluid consistency: Thin      04/25/23 0944            DVT prophylaxis: SCDs Start: 04/25/23 0825   Lab Results  Component Value Date   PLT 109 (L) 04/28/2023      Code Status: Full Code  Family Communication: No family at bedside  Status is: Inpatient Remains inpatient appropriate because: Severity of illness  Level of care: Telemetry Medical  Consultants:  None  Objective: Vitals:   04/27/23 1626 04/27/23 2029 04/28/23 0442 04/28/23 0819  BP: (!) 126/57 (!) 119/49 (!) 109/47 (!) 104/49  Pulse: 60 60 62 65  Resp:  18 16 18   Temp:  98.1  F (36.7 C) 98.2 F (36.8 C) 98.5 F (36.9 C)  TempSrc:  Oral Oral Oral  SpO2: 99% 100% 100% 98%  Weight:      Height:        Intake/Output Summary (Last 24 hours) at 04/28/2023 1024 Last data filed at 04/28/2023 0550 Gross per 24 hour  Intake 1063.75 ml  Output --  Net 1063.75 ml   Wt Readings from Last 3 Encounters:  04/25/23 108.9 kg  03/25/23 110.2 kg  06/30/22 113.8 kg    Examination:  Constitutional: NAD Eyes: lids and conjunctivae normal, no scleral icterus ENMT: mmm Neck: normal, supple Respiratory: clear to auscultation bilaterally, no wheezing, no crackles. Cardiovascular: Regular rate and rhythm, no murmurs / rubs / gallops. No LE edema. Abdomen: soft, no distention, no tenderness. Bowel sounds positive.   Data Reviewed: I have independently reviewed following labs and imaging studies   CBC Recent Labs  Lab 04/25/23 0122 04/25/23 8295  04/26/23 0528 04/27/23 0109 04/27/23 0546 04/27/23 1455 04/28/23 0612  WBC 4.7  --  4.6 3.7* 3.7*  --  4.2  HGB 7.3*   < > 6.8* 7.1* 7.0* 8.1* 7.5*  HCT 22.9*   < > 21.6* 22.0* 21.9* 25.0* 23.8*  PLT 130*  --  124* 104* 102*  --  109*  MCV 95.4  --  93.9 92.4 92.0  --  92.2  MCH 30.4  --  29.6 29.8 29.4  --  29.1  MCHC 31.9  --  31.5 32.3 32.0  --  31.5  RDW 14.4  --  14.2 14.8 14.9  --  15.8*  LYMPHSABS 0.6*  --   --   --   --   --   --   MONOABS 0.5  --   --   --   --   --   --   EOSABS 0.3  --   --   --   --   --   --   BASOSABS 0.0  --   --   --   --   --   --    < > = values in this interval not displayed.    Recent Labs  Lab 04/25/23 0122 04/25/23 0903 04/25/23 0907 04/26/23 0528 04/27/23 0546 04/28/23 0612  NA 138 140  --  138 138 137  K 2.7* 3.1*  --  3.1* 3.5 3.8  CL 101 106  --  104 106 106  CO2 26 23  --  25 22 23   GLUCOSE 100* 89  --  94 94 94  BUN 25* 24*  --  22 18 16   CREATININE 2.25* 2.07*  --  1.89* 1.81* 1.74*  CALCIUM 8.1* 8.0*  --  7.8* 8.2* 8.1*  AST 28  --   --  25 25 23   ALT 15   --   --  16 14 12   ALKPHOS 120  --   --  112 101 97  BILITOT 1.2  --   --  1.0 1.9* 1.0  ALBUMIN  2.1*  --   --  1.9* 2.8* 2.4*  MG 1.8  --   --   --  2.2 2.1  INR  --   --  1.3*  --   --   --   BNP 353.1*  --   --   --   --   --     ------------------------------------------------------------------------------------------------------------------ No results for input(s): "CHOL", "HDL", "LDLCALC", "TRIG", "CHOLHDL", "LDLDIRECT" in the last 72 hours.  No results found for: "HGBA1C" ------------------------------------------------------------------------------------------------------------------ No results for input(s): "TSH", "T4TOTAL", "T3FREE", "THYROIDAB" in the last 72 hours.  Invalid input(s): "FREET3"  Cardiac Enzymes No results for input(s): "CKMB", "TROPONINI", "MYOGLOBIN" in the last 168 hours.  Invalid input(s): "CK" ------------------------------------------------------------------------------------------------------------------    Component Value Date/Time   BNP 353.1 (H) 04/25/2023 0122    CBG: No results for input(s): "GLUCAP" in the last 168 hours.  Recent Results (from the past 240 hours)  Culture, body fluid w Gram Stain-bottle     Status: None (Preliminary result)   Collection Time: 04/25/23 12:10 PM   Specimen: Pleura  Result Value Ref Range Status   Specimen Description PLEURAL  Final   Special Requests NONE  Final   Culture   Final    NO GROWTH 3 DAYS Performed at [facility], [address], [place], [place] [address]    Report Status PENDING  Incomplete  Gram stain  Status: None   Collection Time: 04/25/23 12:10 PM   Specimen: Pleura  Result Value Ref Range Status   Specimen Description PLEURAL  Final   Special Requests NONE  Final   Gram Stain   Final    NO WBC SEEN NO ORGANISMS SEEN Performed at [facility], [address], [place], [place] [address]    Report Status 04/25/2023 FINAL  Final     Radiology Studies: No  results found.    Kathlen Para, MD, PhD [facility]  Between 7 am - 7 pm I am available, please contact me via Amion (for emergencies) or Securechat (non urgent messages)  Between 7 pm - 7 am I am not available, please contact night coverage MD/APP via Amion

## 2023-04-28 NOTE — Progress Notes (Signed)
 Mobility Specialist Progress Note:   04/28/23 0900  Mobility  Activity Ambulated with assistance in hallway  Level of Assistance Contact guard assist, steadying assist  Assistive Device Front wheel walker  Distance Ambulated (ft) 60 ft  Activity Response Tolerated well  Mobility Referral Yes  Mobility visit 1 Mobility  Mobility Specialist Start Time (ACUTE ONLY) [id number]  Mobility Specialist Stop Time (ACUTE ONLY) 0925  Mobility Specialist Time Calculation (min) (ACUTE ONLY) 7 min   Pt received in bed and agreeable. C/o low back pain and BLE pain. Pt having habit of leaning on forearms on RW to compensate for back pain. Aware she should be more upright. Returned to room w/o fault. Pt left on EOB with call bell and all needs met.  D'Vante Nolon Baxter Mobility Specialist Please contact via Special educational needs teacher or Rehab office at [phone]

## 2023-04-28 NOTE — Care Management Obs Status (Signed)
 MEDICARE OBSERVATION STATUS NOTIFICATION   Patient Details  Name: Carla Leonard MRN: [id number] Date of Birth: [birth year]   Medicare Observation Status Notification Given:       Wynonia Hedges 04/28/2023, 3:47 PM

## 2023-04-28 NOTE — Progress Notes (Signed)
 Physical Therapy Treatment Patient Details Name: Carla Leonard MRN: [id number] DOB: October 26, 1955 Today's Date: 04/28/2023   History of Present Illness Pt is a 68 y/o F admitted on 04/25/23 after presenting with c/o abdominal swelling & SOB x 1-2 weeks since having food poisoning. Pt is being treated for decompensated cirrhosis with ascites & AKI superimposed on CKD 3B. + RLE DVT. Pt underwent paracentesis. PMH: HTN, alcoholic cirrhosis, CKD 3, DVT, seizure disorder, anxiety, depression, adrenal sufficiency, drug overdose, insomnia    PT Comments  Pt tolerates treatment well, ambulating for increased distances on this date. Pt seems to be close to her baseline for mobility. PT encourages frequent ambulation in an effort to improve activity tolerance further. PT anticipates no post-acute PT needs at the time of discharge.    If plan is discharge home, recommend the following: Help with stairs or ramp for entrance;Assist for transportation;Assistance with cooking/housework   Can travel by private vehicle        Equipment Recommendations  None recommended by PT    Recommendations for Other Services       Precautions / Restrictions Precautions Precautions: Fall Recall of Precautions/Restrictions: Intact Restrictions Weight Bearing Restrictions Per Provider Order: No Other Position/Activity Restrictions: pt denies WB restrictions although reports having surgery on her L wrist due to recent fx a few weeks ago     Mobility  Bed Mobility                    Transfers Overall transfer level: Needs assistance Equipment used: None, Rolling walker (2 wheels) Transfers: Sit to/from Stand Sit to Stand: Supervision                Ambulation/Gait Ambulation/Gait assistance: Supervision Gait Distance (Feet): 120 Feet Assistive device: Rolling walker (2 wheels) Gait Pattern/deviations: Step-through pattern Gait velocity: reduced Gait velocity interpretation: <1.8 ft/sec,  indicate of risk for recurrent falls   General Gait Details: slowed step-through gait, 3 brief standing rest breaks where pt leans onto elbows on RW. Pt reports this is to relieve back pain   Stairs             Wheelchair Mobility     Tilt Bed    Modified Rankin (Stroke Patients Only)       Balance Overall balance assessment: Needs assistance Sitting-balance support: No upper extremity supported, Feet supported Sitting balance-Leahy Scale: Good     Standing balance support: Single extremity supported, Reliant on assistive device for balance Standing balance-Leahy Scale: Poor                              Communication Communication Communication: No apparent difficulties  Cognition Arousal: Alert Behavior During Therapy: WFL for tasks assessed/performed   PT - Cognitive impairments: No apparent impairments                         Following commands: Intact      Cueing Cueing Techniques: Verbal cues  Exercises      General Comments General comments (skin integrity, edema, etc.): VSS on RA      Pertinent Vitals/Pain Pain Assessment Pain Assessment: Faces Faces Pain Scale: Hurts even more Pain Location: low back Pain Descriptors / Indicators: Aching Pain Intervention(s): Monitored during session    Home Living  Prior Function            PT Goals (current goals can now be found in the care plan section) Acute Rehab PT Goals Patient Stated Goal: return home Progress towards PT goals: Progressing toward goals    Frequency    Min 2X/week      PT Plan      Co-evaluation              AM-PAC PT "6 Clicks" Mobility   Outcome Measure  Help needed turning from your back to your side while in a flat bed without using bedrails?: None Help needed moving from lying on your back to sitting on the side of a flat bed without using bedrails?: None Help needed moving to and from a bed to a  chair (including a wheelchair)?: A Little Help needed standing up from a chair using your arms (e.g., wheelchair or bedside chair)?: A Little Help needed to walk in hospital room?: A Little Help needed climbing 3-5 steps with a railing? : A Little 6 Click Score: 20    End of Session   Activity Tolerance: Patient tolerated treatment well Patient left: in bed;with call bell/phone within reach Nurse Communication: Mobility status PT Visit Diagnosis: Unsteadiness on feet (R26.81);Difficulty in walking, not elsewhere classified (R26.2);Muscle weakness (generalized) (M62.81)     Time: 4782-9562 PT Time Calculation (min) (ACUTE ONLY): 9 min  Charges:    $Gait Training: 8-22 mins PT General Charges $$ ACUTE PT VISIT: 1 Visit                     Rexie Catena, PT, DPT Acute Rehabilitation Office [phone]    Rexie Catena 04/28/2023, 5:23 PM

## 2023-04-28 NOTE — Progress Notes (Signed)
 PHARMACY - ANTICOAGULATION CONSULT NOTE  Pharmacy Consult for Heparin  Indication: DVT  Allergies  Allergen Reactions   Elemental Sulfur Anaphylaxis   Sulfa Antibiotics Anaphylaxis   Prednisone Other (See Comments)    hallucinations   Duloxetine  Other (See Comments)    Per patient   Nsaids Other (See Comments)    Stomach pain, diarrhea   Other Other (See Comments)    Steroids: hallucinations, tolerates hydrocortisone      Patient Measurements: Height: 5\' 1"  (154.9 cm) Weight: 108.9 kg (240 lb) IBW/kg (Calculated) : 47.8 HEPARIN  DW (KG): 74.5  Vital Signs: Temp: 98.5 F (36.9 C) (04/25 0819) Temp Source: Oral (04/25 0819) BP: 104/49 (04/25 0819) Pulse Rate: 65 (04/25 0819)  Labs: Recent Labs    04/25/23 0903 04/25/23 0907 04/26/23 0528 04/26/23 0528 04/27/23 0109 04/27/23 0546 04/27/23 1042 04/27/23 1455 04/27/23 1920 04/28/23 0612  HGB  --   --  6.8*  --  7.1* 7.0*  --  8.1*  --  7.5*  HCT  --   --  21.6*  --  22.0* 21.9*  --  25.0*  --  23.8*  PLT   < >  --  124*  --  104* 102*  --   --   --  109*  LABPROT  --  16.7*  --   --   --   --   --   --   --   --   INR  --  1.3*  --   --   --   --   --   --   --   --   HEPARINUNFRC  --   --   --    < > 0.10*  --  0.25*  --  0.42 0.36  CREATININE  --   --  1.89*  --   --  1.81*  --   --   --  1.74*   < > = values in this interval not displayed.    Estimated Creatinine Clearance: 35.8 mL/min (A) (by C-G formula based on SCr of 1.74 mg/dL (H)).  Assessment:  68 yr old female to begin IV Heparin  on 04/26/23 for RLE DVT.  No heparin  bolus due to anemia per MD instructions.  Will target low therapeutic heparin  levels 0.3-0.5 units/ml.  Hx prior DVT in 2023, cirrhosis. Had been on warfarin during that time.  Not on anticoagulation prior to admission.   Heparin  level 0.36 is therapeutic on 1200 units/hr.  Hemoglobin  8.1 after  PRBCs given on 4/24,  Hgb down to 7.5 this AM.   PLTC low/stable at 109k. No active bleeding  reported.   Goal of Therapy:  Heparin  level 0.3-0.5 units/ml Monitor platelets by anticoagulation protocol: Yes   Plan:  Continue heparin  drip at 1200 units/hr Daily heparin  level and CBC while on heparin . Monitor for signs/symptoms of bleeding. Follow up for transition to oral anticoagulation when able.  Thank you for allowing pharmacy to be part of this patients care team.  Alisa Irish, RPh Clinical Pharmacist 04/28/2023,8:39 AM [phone] until 3:30 PM Please check AMION for all [facility] Pharmacy phone numbers After 10:00 PM, call Main Pharmacy [phone]

## 2023-04-29 DIAGNOSIS — K7460 Unspecified cirrhosis of liver: Secondary | ICD-10-CM | POA: Diagnosis not present

## 2023-04-29 DIAGNOSIS — K7290 Hepatic failure, unspecified without coma: Secondary | ICD-10-CM | POA: Diagnosis not present

## 2023-04-29 LAB — BASIC METABOLIC PANEL WITH GFR
Anion gap: 7 (ref 5–15)
BUN: 15 mg/dL (ref 8–23)
CO2: 24 mmol/L (ref 22–32)
Calcium: 8.2 mg/dL — ABNORMAL LOW (ref 8.9–10.3)
Chloride: 107 mmol/L (ref 98–111)
Creatinine, Ser: 1.73 mg/dL — ABNORMAL HIGH (ref 0.44–1.00)
GFR, Estimated: 32 mL/min — ABNORMAL LOW (ref >60)
Glucose, Bld: 86 mg/dL (ref 70–99)
Potassium: 3.7 mmol/L (ref 3.5–5.1)
Sodium: 138 mmol/L (ref 135–145)

## 2023-04-29 LAB — CBC
HCT: 23.9 % — ABNORMAL LOW (ref 36.0–46.0)
Hemoglobin: 7.7 g/dL — ABNORMAL LOW (ref 12.0–15.0)
MCH: 29.7 pg (ref 26.0–34.0)
MCHC: 32.2 g/dL (ref 30.0–36.0)
MCV: 92.3 fL (ref 80.0–100.0)
Platelets: 107 10*3/uL — ABNORMAL LOW (ref 150–400)
RBC: 2.59 MIL/uL — ABNORMAL LOW (ref 3.87–5.11)
RDW: 15.4 % (ref 11.5–15.5)
WBC: 4.1 10*3/uL (ref 4.0–10.5)
nRBC: 0 % (ref 0.0–0.2)

## 2023-04-29 LAB — HEPARIN LEVEL (UNFRACTIONATED): Heparin Unfractionated: 0.41 [IU]/mL (ref 0.30–0.70)

## 2023-04-29 NOTE — Progress Notes (Signed)
 PHARMACY - ANTICOAGULATION CONSULT NOTE  Pharmacy Consult for Heparin  Indication: DVT  Allergies  Allergen Reactions   Elemental Sulfur Anaphylaxis   Sulfa Antibiotics Anaphylaxis   Prednisone Other (See Comments)    hallucinations   Duloxetine  Other (See Comments)    Per patient   Nsaids Other (See Comments)    Stomach pain, diarrhea   Other Other (See Comments)    Steroids: hallucinations, tolerates hydrocortisone      Patient Measurements: Height: 5\' 1"  (154.9 cm) Weight: 107.1 kg (236 lb 1.8 oz) IBW/kg (Calculated) : 47.8 HEPARIN  DW (KG): 74.5  Vital Signs: Temp: 98.5 F (36.9 C) (04/26 0724) Temp Source: Oral (04/26 0724) BP: 109/50 (04/26 0724) Pulse Rate: 66 (04/26 0724)  Labs: Recent Labs    04/27/23 0546 04/27/23 1042 04/27/23 1455 04/27/23 1920 04/28/23 0612 04/29/23 0600  HGB 7.0*  --  8.1*  --  7.5* 7.7*  HCT 21.9*  --  25.0*  --  23.8* 23.9*  PLT 102*  --   --   --  109* 107*  HEPARINUNFRC  --    < >  --  0.42 0.36 0.41  CREATININE 1.81*  --   --   --  1.74* 1.73*   < > = values in this interval not displayed.    Estimated Creatinine Clearance: 35.6 mL/min (A) (by C-G formula based on SCr of 1.73 mg/dL (H)).  Assessment:  68 yr old female to begin IV Heparin  on 04/26/23 for RLE DVT.  No heparin  bolus due to anemia per MD instructions.  Will target low therapeutic heparin  levels 0.3-0.5 units/ml.  Hx prior DVT in 2023, cirrhosis. Had been on warfarin during that time.  Not on anticoagulation prior to admission.   Heparin  continue to be therapeutic on modified goal. Hgb 7.5, polt ~100k  Goal of Therapy:  Heparin  level 0.3-0.5 units/ml Monitor platelets by anticoagulation protocol: Yes   Plan:  Continue heparin  drip at 1200 units/hr Daily heparin  level and CBC while on heparin . Monitor for signs/symptoms of bleeding. Follow up for transition to oral anticoagulation when able.  Ivery Marking, PharmD, BCIDP, AAHIVP, CPP Infectious Disease  Pharmacist 04/29/2023 8:06 AM

## 2023-04-29 NOTE — Progress Notes (Signed)
 Mobility Specialist Progress Note:    04/29/23 1300  Mobility  Activity Ambulated with assistance in hallway  Level of Assistance Standby assist, set-up cues, supervision of patient - no hands on  Assistive Device Front wheel walker  Distance Ambulated (ft) 100 ft  Activity Response Tolerated well  Mobility Referral Yes  Mobility visit 1 Mobility  Mobility Specialist Start Time (ACUTE ONLY) 1326  Mobility Specialist Stop Time (ACUTE ONLY) 1335  Mobility Specialist Time Calculation (min) (ACUTE ONLY) 9 min   Received pt in bed having no complaints and agreeable to mobility. Pt was asymptomatic throughout ambulation and returned to room w/o fault. Left on EOB w/ call bell in reach and all needs met.   D'Vante Nolon Baxter Mobility Specialist Please contact via Special educational needs teacher or Rehab office at [phone]

## 2023-04-29 NOTE — H&P (View-Only) (Signed)
 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  The patient recalls having a paracentesis 2 years ago and since that time she states that being on a low salt diet and taking her diuretics controls her symptoms.  She was last evaluated by Dr. Rosaline Coma on 06/30/2022 for IDA and she was noted to have a nonbleeding GAVE.  This was ablated with APC and her HGB presumably remained stable.  She presented with an anemia, 7.3 g/dL, and during this hospitalization she was noted to have a new DVT.  Heparin  was started and her HGB declined to 6.8 g/dL.  She received a total of 2 units of PRBC.  Her hemoccult was positive.  The admission abdominal ultrasound did show a moderate amount of ascites.  Past Medical History:  Diagnosis Date   Allergy    "spring time",no medications   Anxiety    Arthritis    back,knee,hands   Asthma    "as a child"   Depression    History of kidney stones    Hypertension    pt.denies updated 02/07/22   Insomnia    Seizures (HCC)    last 2 1/2 years ago    Past Surgical History:  Procedure Laterality Date   APPENDECTOMY     BIOPSY  08/09/2021   Procedure: BIOPSY;  Surgeon: Normie Becton., MD;  Location: [facility];  Service: Gastroenterology;;   CHOLECYSTECTOMY     COLONOSCOPY WITH PROPOFOL  N/A 02/24/2022   Procedure: COLONOSCOPY WITH PROPOFOL ;  Surgeon: Daina Drum, MD;  Location: [facility];  Service: Gastroenterology;  Laterality: N/A;   ELBOW SURGERY     RIGHT   ESOPHAGOGASTRODUODENOSCOPY N/A 08/09/2021   Procedure: ESOPHAGOGASTRODUODENOSCOPY (EGD);  Surgeon: Normie Becton., MD;  Location: [facility];  Service: Gastroenterology;   Laterality: N/A;   ESOPHAGOGASTRODUODENOSCOPY (EGD) WITH PROPOFOL  N/A 06/30/2022   Procedure: ESOPHAGOGASTRODUODENOSCOPY (EGD) WITH PROPOFOL ;  Surgeon: Daina Drum, MD;  Location: [facility];  Service: Gastroenterology;  Laterality: N/A;   EUS N/A 08/09/2021   Procedure: UPPER ENDOSCOPIC ULTRASOUND (EUS) LINEAR ;  Surgeon: Normie Becton., MD;  Location: [facility];  Service: Gastroenterology;  Laterality: N/A;   HEMOSTASIS CLIP PLACEMENT  08/09/2021   Procedure: HEMOSTASIS CLIP PLACEMENT;  Surgeon: Normie Becton., MD;  Location: [facility];  Service: Gastroenterology;;   HEMOSTASIS CLIP PLACEMENT  06/30/2022   Procedure: HEMOSTASIS CLIP PLACEMENT;  Surgeon: Daina Drum, MD;  Location: [facility];  Service: Gastroenterology;;   HOT HEMOSTASIS N/A 08/09/2021   Procedure: HOT HEMOSTASIS (ARGON PLASMA COAGULATION/BICAP);  Surgeon: Normie Becton., MD;  Location: [facility];  Service: Gastroenterology;  Laterality: N/A;   IR PARACENTESIS  04/28/2021   IR PARACENTESIS  05/06/2021   IR PARACENTESIS  05/11/2021   IR PARACENTESIS  04/25/2023   JOINT REPLACEMENT     right TKA 06/2010   KNEE ARTHROPLASTY  06/17/2011   Procedure: COMPUTER ASSISTED TOTAL KNEE ARTHROPLASTY;  Surgeon: Boston Byers, MD;  Location: [facility];  Service: Orthopedics;  Laterality: Left;  TOTAL KNEE REPLACEMENT WITH GENERAL ANESTHESIA AND PRE OP FEMORAL NERVE BLOCK   PERIANAL CYST     POLYPECTOMY  08/09/2021   Procedure: POLYPECTOMY;  Surgeon: Normie Becton.,  MD;  Location: [facility];  Service: Gastroenterology;;   POLYPECTOMY  02/24/2022   Procedure: POLYPECTOMY;  Surgeon: Daina Drum, MD;  Location: [facility];  Service: Gastroenterology;;   POLYPECTOMY  06/30/2022   Procedure: POLYPECTOMY;  Surgeon: Daina Drum, MD;  Location: [facility];  Service: Gastroenterology;;   SUBMUCOSAL TATTOO INJECTION  08/09/2021   Procedure: SUBMUCOSAL TATTOO INJECTION;  Surgeon: Normie Becton.,  MD;  Location: [facility];  Service: Gastroenterology;;    Family History  Problem Relation Age of Onset   Pneumonia Mother    Diabetes Father    Colon cancer Neg Hx    Esophageal cancer Neg Hx    Stomach cancer Neg Hx    Pancreatic cancer Neg Hx    Colon polyps Neg Hx    Crohn's disease Neg Hx    Rectal cancer Neg Hx    Ulcerative colitis Neg Hx     Social History:  reports that she has quit smoking. Her smoking use included cigarettes. She has never used smokeless tobacco. She reports that she does not currently use alcohol. She reports that she does not currently use drugs after having used the following drugs: Marijuana.  Allergies:  Allergies  Allergen Reactions   Elemental Sulfur Anaphylaxis   Sulfa Antibiotics Anaphylaxis   Prednisone Other (See Comments)    hallucinations   Duloxetine  Other (See Comments)    Per patient   Nsaids Other (See Comments)    Stomach pain, diarrhea   Other Other (See Comments)    Steroids: hallucinations, tolerates hydrocortisone      Medications: Scheduled:  carvedilol   3.125 mg Oral BID WC   feeding supplement  237 mL Oral BID BM   ferrous sulfate   325 mg Oral Q breakfast   folic acid   1 mg Oral Daily   hydrocortisone   20 mg Oral Daily   midodrine   10 mg Oral TID WC   pantoprazole   40 mg Oral BID AC   sodium chloride  flush  3 mL Intravenous Q12H   sucralfate   1 g Oral BID WC   Continuous:  heparin  1,200 Units/hr (04/29/23 1554)   promethazine  (PHENERGAN ) injection (IM or IVPB)      Results for orders placed or performed during the hospital encounter of 04/25/23 (from the past 24 hours)  Occult blood card to lab, stool     Status: Abnormal   Collection Time: 04/28/23  6:41 PM  Result Value Ref Range   Fecal Occult Bld POSITIVE (A) NEGATIVE  CBC     Status: Abnormal   Collection Time: 04/29/23  6:00 AM  Result Value Ref Range   WBC 4.1 4.0 - 10.5 K/uL   RBC 2.59 (L) 3.87 - 5.11 MIL/uL   Hemoglobin 7.7 (L) 12.0 - 15.0 g/dL    HCT 78.4 (L) 69.6 - 46.0 %   MCV 92.3 80.0 - 100.0 fL   MCH 29.7 26.0 - 34.0 pg   MCHC 32.2 30.0 - 36.0 g/dL   RDW 29.5 28.4 - 13.2 %   Platelets 107 (L) 150 - 400 K/uL   nRBC 0.0 0.0 - 0.2 %  Heparin  level (unfractionated)     Status: None   Collection Time: 04/29/23  6:00 AM  Result Value Ref Range   Heparin  Unfractionated 0.41 0.30 - 0.70 IU/mL  Basic metabolic panel with GFR     Status: Abnormal   Collection Time: 04/29/23  6:00 AM  Result Value Ref Range   Sodium 138 135 - 145 mmol/L  Potassium 3.7 3.5 - 5.1 mmol/L   Chloride 107 98 - 111 mmol/L   CO2 24 22 - 32 mmol/L   Glucose, Bld 86 70 - 99 mg/dL   BUN 15 8 - 23 mg/dL   Creatinine, Ser 1.61 (H) 0.44 - 1.00 mg/dL   Calcium 8.2 (L) 8.9 - 10.3 mg/dL   GFR, Estimated 32 (L) >60 mL/min   Anion gap 7 5 - 15     No results found.  ROS:  As stated above in the HPI otherwise negative.  Blood pressure (!) 135/53, pulse 62, temperature 98.2 F (36.8 C), temperature source Oral, resp. rate 18, height 5\' 1"  (1.549 m), weight 107.1 kg, SpO2 98%.    PE: Gen: NAD, Alert and Oriented HEENT:  [place]/AT, EOMI Neck: Supple, no LAD Lungs: CTA Bilaterally CV: RRR without M/G/R ABD: Soft, NTND, +BS Ext: No C/C/E  Assessment/Plan: 1) Anemia and history of IDA. 2) GAVE. 3) Grade II esophageal varices. 4) MetALD cirrhosis. 5) Ascites. 6) CKD. 7) DVT.   The patient requires further work up with an EGD with her history of GAVE.  She as not bleeding last year, but the initiation of heparin  may have lowered the threshold for her to bleed.  She is hemodynamically stable and she will remain on heparin  to increase the yield for finding any bleeding source.  Therapy, if needed, will be determined in light of her need for heparin .  She was noted to have a moderate amount of ascites and she self-titrated her diuretics.  Last year the plan was to decrease the lasix  and increase her spironolactone  to 150 mg.  She reports being compliant with a  low salt diet, but when pressed, she is not the one counting her sodium intake.  Plan: 1) EGD with possible APC on heparin . 2) Continue with the 2 gram sodium diet. 3) Check spot urine sodium, although this will not be representative of her home habits.  It can help to obtain a baseline comparison when she is restarted back on diuretics.  Taraoluwa Thakur D 04/29/2023, 6:01 PM

## 2023-04-29 NOTE — Progress Notes (Signed)
 PROGRESS NOTE  Carla Leonard ZOX:[id number] DOB: 05-24-1955 DOA: 04/25/2023 PCP: Sharma Dears, NP   LOS: 4 days   Brief Narrative / Interim history: 68 year old female with HTN, alcoholic cirrhosis, CKD 3, prior DVT, nonepileptic spells, adrenal insufficiency, who comes to the hospital with complaints of progressive abdominal swelling, similar to a prior episode 2 years ago when she required paracentesis.  She has been taking more furosemide  at home but that did not help.  Also reports recent episode of food poisoning with nausea and vomiting, this is now resolved.  Subjective / 24h Interval events: Feels well, no chest pain, no shortness of breath.  Assesement and Plan: Principal Problem:   Decompensated cirrhosis (HCC) Active Problems:   Acute kidney injury superimposed on chronic kidney disease (HCC)   Elevated troponin   Hypokalemia   Normocytic anemia   Thrombocytopenia (HCC)   Lower extremity edema  Principal problem Anemia, acute on chronic -likely in the setting of underlying liver disease, and possible bleeding.  On admission patient was found to be anemic, however fecal occult was negative.  Initial hemoglobin was 6.8, she was transfused unit of packed red blood cells but only improved to 7.0.  She was transfused a second unit.  Patient was started on heparin  as below for new onset DVT, and given hemoglobin being still marginally low fecal occult was checked again and is now positive.  Keep on heparin  without frank bleeding, but hold off transition to Eliquis - GI consulted today, appreciate input  Active problems Acute kidney injury on chronic kidney disease stage IIIb -baseline creatinine around 1.4, it was 2.2 on admission.  She received IV fluids, creatinine gradually improving, stabilized  Liver cirrhosis, decompensated-with ascites.  Underwent paracentesis on 4/22 with 2 L of fluid removed.  Fluid analysis with negative Gram stain, no growth, but do not see  cell count - She is status post albumin  1/23  Hypokalemia-due to recent illness with nausea, vomiting, poor p.o. intake.  Continue to monitor and replace as indicated  Hypophosphatemia-monitor replace as indicated  Anxiety -patient reports increased anxiety recently due to her fluid overload.  Tells me she takes Ativan  at home, resume that here.  Much better  Elevated troponin-no chest pain, flat, not in a pattern consistent with ACS, likely demand ischemia  Adrenal insufficiency-continue midodrine , hydrocortisone   Lower extremity swelling, history of DVT-Doppler ultrasound positive for DVT.  Case was discussed with Dr. Leonia Raman with gastroenterology, she has a history of EV's as well as GAVE, however without any current active bleeding she could be placed on anticoagulation with close monitoring - Continue IV heparin  for now, received total of 2 units of pRBC as above, hemoglobin improved but somewhat lower than expected.  Fecal occult positive, GI consulted  Thrombocytopenia-due to liver disease, stable, no bleeding  Obesity, morbid-BMI 45.  She would benefit from weight loss   Scheduled Meds:  carvedilol   3.125 mg Oral BID WC   feeding supplement  237 mL Oral BID BM   ferrous sulfate   325 mg Oral Q breakfast   folic acid   1 mg Oral Daily   hydrocortisone   20 mg Oral Daily   midodrine   10 mg Oral TID WC   pantoprazole   40 mg Oral BID AC   sodium chloride  flush  3 mL Intravenous Q12H   sucralfate   1 g Oral BID WC   Continuous Infusions:  heparin  1,200 Units/hr (04/28/23 1635)   promethazine  (PHENERGAN ) injection (IM or IVPB)     PRN  Meds:.fluticasone  furoate-vilanterol, LORazepam , oxyCODONE , promethazine  **OR** promethazine  (PHENERGAN ) injection (IM or IVPB), rOPINIRole   Current Outpatient Medications  Medication Instructions   acetaminophen  (TYLENOL ) 1,500 mg, Oral, Daily PRN, Low back pain   albuterol  (VENTOLIN  HFA) 108 (90 Base) MCG/ACT inhaler 2 puffs, Inhalation, Every  6 hours PRN   b complex vitamins capsule 1 capsule, Oral, Daily   BREO ELLIPTA  100-25 MCG/ACT AEPB 1 puff, Inhalation, As needed   ciprofloxacin  (CIPRO ) 500 mg, Oral, Daily   Cranberry 500 MG CAPS 1 capsule, Oral, Daily   ferrous sulfate  325 mg, Oral, Daily with breakfast   folic acid  (FOLVITE ) 1 mg, Oral, Daily   furosemide  (LASIX ) 60 mg, Oral, Daily   hydrocortisone  (CORTEF ) 10 MG tablet Take 2 tablets (20 mg total) by mouth daily.  Do not stop it abruptly unless told by the endocrinologist.   midodrine  (PROAMATINE ) 10 mg, Oral, 3 times daily with meals   pantoprazole  (PROTONIX ) 40 mg, Oral, 2 times daily before meals   potassium chloride  SA (KLOR-CON  M) 20 MEQ tablet 20 mEq, Oral, 2 times daily   promethazine  (PHENERGAN ) 12.5 mg, Oral, Every 6 hours PRN   rOPINIRole  (REQUIP ) 0.25 mg, Daily at bedtime   spironolactone  (ALDACTONE ) 150 mg, Oral, 3 times daily   sucralfate  (CARAFATE ) 1 g, Oral, 2 times daily   Vitamin D3 1,000 Units, Oral, Daily    Diet Orders (From admission, onward)     Start     Ordered   04/25/23 0945  Diet 2 gram sodium Room service appropriate? Yes; Fluid consistency: Thin  Diet effective now       Question Answer Comment  Room service appropriate? Yes   Fluid consistency: Thin      04/25/23 0944            DVT prophylaxis: SCDs Start: 04/25/23 0825   Lab Results  Component Value Date   PLT 107 (L) 04/29/2023      Code Status: Full Code  Family Communication: No family at bedside  Status is: Inpatient Remains inpatient appropriate because: Severity of illness  Level of care: Telemetry Medical  Consultants:  None  Objective: Vitals:   04/28/23 1631 04/28/23 2030 04/29/23 0505 04/29/23 0724  BP: (!) 111/49 (!) 91/41 102/60 (!) 109/50  Pulse: 71 67 64 66  Resp: 16 20 20 18   Temp: 98.2 F (36.8 C) 98.5 F (36.9 C) 98.6 F (37 C) 98.5 F (36.9 C)  TempSrc: Oral Oral Oral Oral  SpO2: 100% 100% 100% 98%  Weight:  107.1 kg    Height:         Intake/Output Summary (Last 24 hours) at 04/29/2023 1126 Last data filed at 04/29/2023 0600 Gross per 24 hour  Intake 888.01 ml  Output 0 ml  Net 888.01 ml   Wt Readings from Last 3 Encounters:  04/28/23 107.1 kg  03/25/23 110.2 kg  06/30/22 113.8 kg    Examination:  Constitutional: NAD Eyes: lids and conjunctivae normal, no scleral icterus ENMT: mmm Neck: normal, supple Respiratory: clear to auscultation bilaterally, no wheezing, no crackles. Normal respiratory effort.  Cardiovascular: Regular rate and rhythm, no murmurs / rubs / gallops. No LE edema. Abdomen: soft, no distention, no tenderness. Bowel sounds positive.   Data Reviewed: I have independently reviewed following labs and imaging studies   CBC Recent Labs  Lab 04/25/23 0122 04/25/23 0903 04/26/23 0528 04/27/23 0109 04/27/23 0546 04/27/23 1455 04/28/23 0612 04/29/23 0600  WBC 4.7  --  4.6 3.7* 3.7*  --  4.2  4.1  HGB 7.3*   < > 6.8* 7.1* 7.0* 8.1* 7.5* 7.7*  HCT 22.9*   < > 21.6* 22.0* 21.9* 25.0* 23.8* 23.9*  PLT 130*  --  124* 104* 102*  --  109* 107*  MCV 95.4  --  93.9 92.4 92.0  --  92.2 92.3  MCH 30.4  --  29.6 29.8 29.4  --  29.1 29.7  MCHC 31.9  --  31.5 32.3 32.0  --  31.5 32.2  RDW 14.4  --  14.2 14.8 14.9  --  15.8* 15.4  LYMPHSABS 0.6*  --   --   --   --   --   --   --   MONOABS 0.5  --   --   --   --   --   --   --   EOSABS 0.3  --   --   --   --   --   --   --   BASOSABS 0.0  --   --   --   --   --   --   --    < > = values in this interval not displayed.    Recent Labs  Lab 04/25/23 0122 04/25/23 0903 04/25/23 0907 04/26/23 0528 04/27/23 0546 04/28/23 0612 04/29/23 0600  NA 138 140  --  138 138 137 138  K 2.7* 3.1*  --  3.1* 3.5 3.8 3.7  CL 101 106  --  104 106 106 107  CO2 26 23  --  25 22 23 24   GLUCOSE 100* 89  --  94 94 94 86  BUN 25* 24*  --  22 18 16 15   CREATININE 2.25* 2.07*  --  1.89* 1.81* 1.74* 1.73*  CALCIUM 8.1* 8.0*  --  7.8* 8.2* 8.1* 8.2*  AST 28  --    --  25 25 23   --   ALT 15  --   --  16 14 12   --   ALKPHOS 120  --   --  112 101 97  --   BILITOT 1.2  --   --  1.0 1.9* 1.0  --   ALBUMIN  2.1*  --   --  1.9* 2.8* 2.4*  --   MG 1.8  --   --   --  2.2 2.1  --   INR  --   --  1.3*  --   --   --   --   BNP 353.1*  --   --   --   --   --   --     ------------------------------------------------------------------------------------------------------------------ No results for input(s): "CHOL", "HDL", "LDLCALC", "TRIG", "CHOLHDL", "LDLDIRECT" in the last 72 hours.  No results found for: "HGBA1C" ------------------------------------------------------------------------------------------------------------------ No results for input(s): "TSH", "T4TOTAL", "T3FREE", "THYROIDAB" in the last 72 hours.  Invalid input(s): "FREET3"  Cardiac Enzymes No results for input(s): "CKMB", "TROPONINI", "MYOGLOBIN" in the last 168 hours.  Invalid input(s): "CK" ------------------------------------------------------------------------------------------------------------------    Component Value Date/Time   BNP 353.1 (H) 04/25/2023 0122    CBG: No results for input(s): "GLUCAP" in the last 168 hours.  Recent Results (from the past 240 hours)  Culture, body fluid w Gram Stain-bottle     Status: None (Preliminary result)   Collection Time: 04/25/23 12:10 PM   Specimen: Pleura  Result Value Ref Range Status   Specimen Description PLEURAL  Final   Special Requests NONE  Final   Culture   Final  NO GROWTH 4 DAYS Performed at [facility], [address], [place], [place] [address]    Report Status PENDING  Incomplete  Gram stain     Status: None   Collection Time: 04/25/23 12:10 PM   Specimen: Pleura  Result Value Ref Range Status   Specimen Description PLEURAL  Final   Special Requests NONE  Final   Gram Stain   Final    NO WBC SEEN NO ORGANISMS SEEN Performed at [facility], [address], [place], [place] [address]    Report  Status 04/25/2023 FINAL  Final     Radiology Studies: No results found.    Kathlen Para, MD, PhD [facility]  Between 7 am - 7 pm I am available, please contact me via Amion (for emergencies) or Securechat (non urgent messages)  Between 7 pm - 7 am I am not available, please contact night coverage MD/APP via Amion

## 2023-04-29 NOTE — Consult Note (Signed)
 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  The patient recalls having a paracentesis 2 years ago and since that time she states that being on a low salt diet and taking her diuretics controls her symptoms.  She was last evaluated by Dr. Rosaline Coma on 06/30/2022 for IDA and she was noted to have a nonbleeding GAVE.  This was ablated with APC and her HGB presumably remained stable.  She presented with an anemia, 7.3 g/dL, and during this hospitalization she was noted to have a new DVT.  Heparin  was started and her HGB declined to 6.8 g/dL.  She received a total of 2 units of PRBC.  Her hemoccult was positive.  The admission abdominal ultrasound did show a moderate amount of ascites.  Past Medical History:  Diagnosis Date   Allergy    "spring time",no medications   Anxiety    Arthritis    back,knee,hands   Asthma    "as a child"   Depression    History of kidney stones    Hypertension    pt.denies updated 02/07/22   Insomnia    Seizures (HCC)    last 2 1/2 years ago    Past Surgical History:  Procedure Laterality Date   APPENDECTOMY     BIOPSY  08/09/2021   Procedure: BIOPSY;  Surgeon: Normie Becton., MD;  Location: [facility];  Service: Gastroenterology;;   CHOLECYSTECTOMY     COLONOSCOPY WITH PROPOFOL  N/A 02/24/2022   Procedure: COLONOSCOPY WITH PROPOFOL ;  Surgeon: Daina Drum, MD;  Location: [facility];  Service: Gastroenterology;  Laterality: N/A;   ELBOW SURGERY     RIGHT   ESOPHAGOGASTRODUODENOSCOPY N/A 08/09/2021   Procedure: ESOPHAGOGASTRODUODENOSCOPY (EGD);  Surgeon: Normie Becton., MD;  Location: [facility];  Service: Gastroenterology;   Laterality: N/A;   ESOPHAGOGASTRODUODENOSCOPY (EGD) WITH PROPOFOL  N/A 06/30/2022   Procedure: ESOPHAGOGASTRODUODENOSCOPY (EGD) WITH PROPOFOL ;  Surgeon: Daina Drum, MD;  Location: [facility];  Service: Gastroenterology;  Laterality: N/A;   EUS N/A 08/09/2021   Procedure: UPPER ENDOSCOPIC ULTRASOUND (EUS) LINEAR ;  Surgeon: Normie Becton., MD;  Location: [facility];  Service: Gastroenterology;  Laterality: N/A;   HEMOSTASIS CLIP PLACEMENT  08/09/2021   Procedure: HEMOSTASIS CLIP PLACEMENT;  Surgeon: Normie Becton., MD;  Location: [facility];  Service: Gastroenterology;;   HEMOSTASIS CLIP PLACEMENT  06/30/2022   Procedure: HEMOSTASIS CLIP PLACEMENT;  Surgeon: Daina Drum, MD;  Location: [facility];  Service: Gastroenterology;;   HOT HEMOSTASIS N/A 08/09/2021   Procedure: HOT HEMOSTASIS (ARGON PLASMA COAGULATION/BICAP);  Surgeon: Normie Becton., MD;  Location: [facility];  Service: Gastroenterology;  Laterality: N/A;   IR PARACENTESIS  04/28/2021   IR PARACENTESIS  05/06/2021   IR PARACENTESIS  05/11/2021   IR PARACENTESIS  04/25/2023   JOINT REPLACEMENT     right TKA 06/2010   KNEE ARTHROPLASTY  06/17/2011   Procedure: COMPUTER ASSISTED TOTAL KNEE ARTHROPLASTY;  Surgeon: Boston Byers, MD;  Location: [facility];  Service: Orthopedics;  Laterality: Left;  TOTAL KNEE REPLACEMENT WITH GENERAL ANESTHESIA AND PRE OP FEMORAL NERVE BLOCK   PERIANAL CYST     POLYPECTOMY  08/09/2021   Procedure: POLYPECTOMY;  Surgeon: Normie Becton.,  MD;  Location: [facility];  Service: Gastroenterology;;   POLYPECTOMY  02/24/2022   Procedure: POLYPECTOMY;  Surgeon: Daina Drum, MD;  Location: [facility];  Service: Gastroenterology;;   POLYPECTOMY  06/30/2022   Procedure: POLYPECTOMY;  Surgeon: Daina Drum, MD;  Location: [facility];  Service: Gastroenterology;;   SUBMUCOSAL TATTOO INJECTION  08/09/2021   Procedure: SUBMUCOSAL TATTOO INJECTION;  Surgeon: Normie Becton.,  MD;  Location: [facility];  Service: Gastroenterology;;    Family History  Problem Relation Age of Onset   Pneumonia Mother    Diabetes Father    Colon cancer Neg Hx    Esophageal cancer Neg Hx    Stomach cancer Neg Hx    Pancreatic cancer Neg Hx    Colon polyps Neg Hx    Crohn's disease Neg Hx    Rectal cancer Neg Hx    Ulcerative colitis Neg Hx     Social History:  reports that she has quit smoking. Her smoking use included cigarettes. She has never used smokeless tobacco. She reports that she does not currently use alcohol. She reports that she does not currently use drugs after having used the following drugs: Marijuana.  Allergies:  Allergies  Allergen Reactions   Elemental Sulfur Anaphylaxis   Sulfa Antibiotics Anaphylaxis   Prednisone Other (See Comments)    hallucinations   Duloxetine  Other (See Comments)    Per patient   Nsaids Other (See Comments)    Stomach pain, diarrhea   Other Other (See Comments)    Steroids: hallucinations, tolerates hydrocortisone      Medications: Scheduled:  carvedilol   3.125 mg Oral BID WC   feeding supplement  237 mL Oral BID BM   ferrous sulfate   325 mg Oral Q breakfast   folic acid   1 mg Oral Daily   hydrocortisone   20 mg Oral Daily   midodrine   10 mg Oral TID WC   pantoprazole   40 mg Oral BID AC   sodium chloride  flush  3 mL Intravenous Q12H   sucralfate   1 g Oral BID WC   Continuous:  heparin  1,200 Units/hr (04/29/23 1554)   promethazine  (PHENERGAN ) injection (IM or IVPB)      Results for orders placed or performed during the hospital encounter of 04/25/23 (from the past 24 hours)  Occult blood card to lab, stool     Status: Abnormal   Collection Time: 04/28/23  6:41 PM  Result Value Ref Range   Fecal Occult Bld POSITIVE (A) NEGATIVE  CBC     Status: Abnormal   Collection Time: 04/29/23  6:00 AM  Result Value Ref Range   WBC 4.1 4.0 - 10.5 K/uL   RBC 2.59 (L) 3.87 - 5.11 MIL/uL   Hemoglobin 7.7 (L) 12.0 - 15.0 g/dL    HCT 78.4 (L) 69.6 - 46.0 %   MCV 92.3 80.0 - 100.0 fL   MCH 29.7 26.0 - 34.0 pg   MCHC 32.2 30.0 - 36.0 g/dL   RDW 29.5 28.4 - 13.2 %   Platelets 107 (L) 150 - 400 K/uL   nRBC 0.0 0.0 - 0.2 %  Heparin  level (unfractionated)     Status: None   Collection Time: 04/29/23  6:00 AM  Result Value Ref Range   Heparin  Unfractionated 0.41 0.30 - 0.70 IU/mL  Basic metabolic panel with GFR     Status: Abnormal   Collection Time: 04/29/23  6:00 AM  Result Value Ref Range   Sodium 138 135 - 145 mmol/L  Potassium 3.7 3.5 - 5.1 mmol/L   Chloride 107 98 - 111 mmol/L   CO2 24 22 - 32 mmol/L   Glucose, Bld 86 70 - 99 mg/dL   BUN 15 8 - 23 mg/dL   Creatinine, Ser 1.61 (H) 0.44 - 1.00 mg/dL   Calcium 8.2 (L) 8.9 - 10.3 mg/dL   GFR, Estimated 32 (L) >60 mL/min   Anion gap 7 5 - 15     No results found.  ROS:  As stated above in the HPI otherwise negative.  Blood pressure (!) 135/53, pulse 62, temperature 98.2 F (36.8 C), temperature source Oral, resp. rate 18, height 5\' 1"  (1.549 m), weight 107.1 kg, SpO2 98%.    PE: Gen: NAD, Alert and Oriented HEENT:  [place]/AT, EOMI Neck: Supple, no LAD Lungs: CTA Bilaterally CV: RRR without M/G/R ABD: Soft, NTND, +BS Ext: No C/C/E  Assessment/Plan: 1) Anemia and history of IDA. 2) GAVE. 3) Grade II esophageal varices. 4) MetALD cirrhosis. 5) Ascites. 6) CKD. 7) DVT.   The patient requires further work up with an EGD with her history of GAVE.  She as not bleeding last year, but the initiation of heparin  may have lowered the threshold for her to bleed.  She is hemodynamically stable and she will remain on heparin  to increase the yield for finding any bleeding source.  Therapy, if needed, will be determined in light of her need for heparin .  She was noted to have a moderate amount of ascites and she self-titrated her diuretics.  Last year the plan was to decrease the lasix  and increase her spironolactone  to 150 mg.  She reports being compliant with a  low salt diet, but when pressed, she is not the one counting her sodium intake.  Plan: 1) EGD with possible APC on heparin . 2) Continue with the 2 gram sodium diet. 3) Check spot urine sodium, although this will not be representative of her home habits.  It can help to obtain a baseline comparison when she is restarted back on diuretics.  Taraoluwa Thakur D 04/29/2023, 6:01 PM

## 2023-04-29 NOTE — Plan of Care (Signed)
  Problem: Clinical Measurements: Goal: Respiratory complications will improve Outcome: Progressing   Problem: Activity: Goal: Risk for activity intolerance will decrease Outcome: Progressing   Problem: Nutrition: Goal: Adequate nutrition will be maintained Outcome: Progressing   Problem: Elimination: Goal: Will not experience complications related to bowel motility Outcome: Progressing

## 2023-04-29 NOTE — Plan of Care (Signed)
   Problem: Education: Goal: Knowledge of General Education information will improve Description: Including pain rating scale, medication(s)/side effects and non-pharmacologic comfort measures Outcome: Completed/Met

## 2023-04-30 ENCOUNTER — Encounter (HOSPITAL_COMMUNITY): Payer: Self-pay | Admitting: Family Medicine

## 2023-04-30 ENCOUNTER — Encounter (HOSPITAL_COMMUNITY)
Admission: EM | Disposition: A | Discharge status: Home / Self Care | Payer: Self-pay | Source: Home / Self Care | Attending: Internal Medicine

## 2023-04-30 ENCOUNTER — Inpatient Hospital Stay (HOSPITAL_COMMUNITY): Admitting: Certified Registered Nurse Anesthetist

## 2023-04-30 DIAGNOSIS — K7290 Hepatic failure, unspecified without coma: Secondary | ICD-10-CM | POA: Diagnosis not present

## 2023-04-30 DIAGNOSIS — D509 Iron deficiency anemia, unspecified: Secondary | ICD-10-CM

## 2023-04-30 DIAGNOSIS — F418 Other specified anxiety disorders: Secondary | ICD-10-CM | POA: Diagnosis not present

## 2023-04-30 DIAGNOSIS — K259 Gastric ulcer, unspecified as acute or chronic, without hemorrhage or perforation: Secondary | ICD-10-CM | POA: Diagnosis not present

## 2023-04-30 DIAGNOSIS — K7460 Unspecified cirrhosis of liver: Secondary | ICD-10-CM | POA: Diagnosis not present

## 2023-04-30 DIAGNOSIS — K269 Duodenal ulcer, unspecified as acute or chronic, without hemorrhage or perforation: Secondary | ICD-10-CM

## 2023-04-30 HISTORY — PX: ESOPHAGOGASTRODUODENOSCOPY: SHX5428

## 2023-04-30 LAB — CBC
HCT: 24.4 % — ABNORMAL LOW (ref 36.0–46.0)
Hemoglobin: 7.7 g/dL — ABNORMAL LOW (ref 12.0–15.0)
MCH: 29.4 pg (ref 26.0–34.0)
MCHC: 31.6 g/dL (ref 30.0–36.0)
MCV: 93.1 fL (ref 80.0–100.0)
Platelets: 106 10*3/uL — ABNORMAL LOW (ref 150–400)
RBC: 2.62 MIL/uL — ABNORMAL LOW (ref 3.87–5.11)
RDW: 15.5 % (ref 11.5–15.5)
WBC: 4 10*3/uL (ref 4.0–10.5)
nRBC: 0 % (ref 0.0–0.2)

## 2023-04-30 LAB — CULTURE, BODY FLUID W GRAM STAIN -BOTTLE: Culture: NO GROWTH

## 2023-04-30 LAB — BASIC METABOLIC PANEL WITH GFR
Anion gap: 8 (ref 5–15)
BUN: 15 mg/dL (ref 8–23)
CO2: 24 mmol/L (ref 22–32)
Calcium: 8.4 mg/dL — ABNORMAL LOW (ref 8.9–10.3)
Chloride: 107 mmol/L (ref 98–111)
Creatinine, Ser: 1.87 mg/dL — ABNORMAL HIGH (ref 0.44–1.00)
GFR, Estimated: 29 mL/min — ABNORMAL LOW (ref >60)
Glucose, Bld: 84 mg/dL (ref 70–99)
Potassium: 3.6 mmol/L (ref 3.5–5.1)
Sodium: 139 mmol/L (ref 135–145)

## 2023-04-30 LAB — HEPARIN LEVEL (UNFRACTIONATED): Heparin Unfractionated: 0.36 [IU]/mL (ref 0.30–0.70)

## 2023-04-30 LAB — MAGNESIUM: Magnesium: 2.1 mg/dL (ref 1.7–2.4)

## 2023-04-30 SURGERY — EGD (ESOPHAGOGASTRODUODENOSCOPY)
Anesthesia: Monitor Anesthesia Care

## 2023-04-30 MED ORDER — PHENYLEPHRINE 80 MCG/ML (10ML) SYRINGE FOR IV PUSH (FOR BLOOD PRESSURE SUPPORT)
PREFILLED_SYRINGE | INTRAVENOUS | Status: DC | PRN
  Administered 2023-04-30: 80 ug via INTRAVENOUS

## 2023-04-30 MED ORDER — PROPOFOL 10 MG/ML IV BOLUS
INTRAVENOUS | Status: DC | PRN
  Administered 2023-04-30 (×2): 20 mg via INTRAVENOUS

## 2023-04-30 MED ORDER — PROPOFOL 500 MG/50ML IV EMUL
INTRAVENOUS | Status: DC | PRN
  Administered 2023-04-30: 100 ug/kg/min via INTRAVENOUS

## 2023-04-30 MED ORDER — SODIUM CHLORIDE 0.9 % IV SOLN: INTRAVENOUS | Status: DC | PRN

## 2023-04-30 MED ORDER — FERROUS SULFATE 325 (65 FE) MG PO TABS
325.0000 mg | ORAL_TABLET | Freq: Two times a day (BID) | ORAL | Status: DC
  Administered 2023-04-30 – 2023-05-02 (×4): 325 mg via ORAL
  Filled 2023-04-30 (×4): qty 1

## 2023-04-30 MED ORDER — APIXABAN 5 MG PO TABS
5.0000 mg | ORAL_TABLET | Freq: Two times a day (BID) | ORAL | Status: DC
  Administered 2023-05-01: 5 mg via ORAL
  Filled 2023-04-30: qty 1

## 2023-04-30 NOTE — Anesthesia Preprocedure Evaluation (Addendum)
 Anesthesia Evaluation  Patient identified by MRN, date of birth, ID band Patient awake    Reviewed: Allergy & Precautions, NPO status , Patient's Chart, lab work & pertinent test results  History of Anesthesia Complications Negative for: history of anesthetic complications  Airway Mallampati: III  TM Distance: >3 FB Neck ROM: Full    Dental  (+) Edentulous Upper, Edentulous Lower   Pulmonary neg shortness of breath, asthma , neg sleep apnea, neg COPD, neg recent URI, former smoker   breath sounds clear to auscultation       Cardiovascular hypertension,  Rhythm:Regular  Ef 55-60%, mild pulm htn, trivial mr   Neuro/Psych Seizures -,  PSYCHIATRIC DISORDERS Anxiety Depression       GI/Hepatic ,,,(+) Cirrhosis       Lab Results      Component                Value               Date                      ALT                      12                  04/28/2023                AST                      23                  04/28/2023                ALKPHOS                  97                  04/28/2023                BILITOT                  1.0                 04/28/2023              Endo/Other    Class 3 obesity  Renal/GU CRF and ARFRenal diseaseLab Results      Component                Value               Date                      NA                       139                 04/30/2023                K                        3.6                 04/30/2023                CO2  24                  04/30/2023                GLUCOSE                  84                  04/30/2023                BUN                      15                  04/30/2023                CREATININE               1.87 (H)            04/30/2023                CALCIUM                  8.4 (L)             04/30/2023                GFR                      39.77 (L)           04/26/2022                EGFR                     39 (L)               04/28/2022                GFRNONAA                 29 (L)              04/30/2023                Musculoskeletal  (+) Arthritis ,    Abdominal   Peds  Hematology  (+) Blood dyscrasia, anemia Lab Results      Component                Value               Date                      WBC                      4.0                 04/30/2023                HGB                      7.7 (L)             04/30/2023                HCT                      24.4 (L)  04/30/2023                MCV                      93.1                04/30/2023                PLT                      106 (L)             04/30/2023              Anesthesia Other Findings   Reproductive/Obstetrics                              Anesthesia Physical Anesthesia Plan  ASA: 4  Anesthesia Plan: MAC   Post-op Pain Management: Minimal or no pain anticipated   Induction: Intravenous  PONV Risk Score and Plan: 2 and Propofol  infusion and Treatment may vary due to age or medical condition  Airway Management Planned: Nasal Cannula, Simple Face Mask and Natural Airway  Additional Equipment: None  Intra-op Plan:   Post-operative Plan:   Informed Consent: I have reviewed the patients History and Physical, chart, labs and discussed the procedure including the risks, benefits and alternatives for the proposed anesthesia with the patient or authorized representative who has indicated his/her understanding and acceptance.     Dental advisory given  Plan Discussed with: CRNA  Anesthesia Plan Comments:          Anesthesia Quick Evaluation

## 2023-04-30 NOTE — Progress Notes (Signed)
 PROGRESS NOTE  Carla Leonard:[id number] DOB: 1955-02-12 DOA: 04/25/2023 PCP: Sharma Dears, NP   LOS: 5 days   Brief Narrative / Interim history: Carla Leonard is a  68 year old female with HTN, alcoholic cirrhosis, CKD 3, prior DVT, nonepileptic spells, adrenal insufficiency, who comes to the hospital with complaints of progressive abdominal swelling, similar to a prior episode 2 years ago when she required paracentesis.  She has been taking more furosemide  at home but that did not help.  Also reports recent episode of food poisoning with nausea and vomiting, this is now resolved.  Subjective / 24h Interval events: -The patient was seen and examined this morning, stable no acute distress, n.p.o. for EGD Tolerated EGD well Resuming diet per GI    Assesement and Plan: Principal Problem:   Decompensated cirrhosis (HCC) Active Problems:   Acute kidney injury superimposed on chronic kidney disease (HCC)   Elevated troponin   Hypokalemia   Normocytic anemia   Thrombocytopenia (HCC)   Lower extremity edema  Principal problem:   Anemia, acute on chronic  -likely in the setting of underlying liver disease, and possible bleeding from gastric and duodenal ulcers. -POA on admission found anemic,-Hemoccult initially negative subsequently positive - Hemoglobin dropped to 6.8, s/p 2U PRBC transfusion -On heparin  drip for new DVT treatment-pending transition to Eliquis    Latest Ref Rng & Units 04/30/2023    6:57 AM 04/29/2023    6:00 AM 04/28/2023    6:12 AM  CBC  WBC 4.0 - 10.5 K/uL 4.0  4.1  4.2   Hemoglobin 12.0 - 15.0 g/dL 7.7  7.7  7.5   Hematocrit 36.0 - 46.0 % 24.4  23.9  23.8   Platelets 150 - 400 K/uL 106  107  109       Gastric/duodenal ulcers:  - GI consulted. 04/30/2023 s/p EGD: Finding consistent with gastric duodenal ulcers -portal hypertension-no active bleeding - Recommend to continue PPI twice daily, Carafate , advancing diet,  Active  problems;   Acute kidney injury on chronic kidney disease stage IIIb -baseline creatinine around 1.4, it was 2.2 on admission.  She received IV fluids, creatinine gradually improving, stabilized Lab Results  Component Value Date   CREATININE 1.87 (H) 04/30/2023   CREATININE 1.73 (H) 04/29/2023   CREATININE 1.74 (H) 04/28/2023     Liver cirrhosis, decompensated-with ascites.  Underwent paracentesis on 4/22 with 2 L of fluid removed.  Fluid analysis with negative Gram stain, no growth, but do not see cell count - She is status post albumin  1/23  Hypokalemia-due to recent illness with nausea, vomiting, poor p.o. intake.  Continue to monitor and replace as indicated  Hypophosphatemia-monitor replace as indicated  Anxiety -patient reports increased anxiety recently due to her fluid overload.  Tells me she takes Ativan  at home, resume that here.  Much better  Elevated troponin-no chest pain, flat, not in a pattern consistent with ACS, likely demand ischemia  Adrenal insufficiency-continue midodrine , hydrocortisone   Lower extremity swelling, history of DVT-Doppler ultrasound positive for DVT.  Case was discussed with Dr. Leonia Raman with gastroenterology, she has a history of EV's as well as GAVE, however without any current active bleeding she could be placed on anticoagulation with close monitoring - Continue IV heparin  for now, received total of 2 units of pRBC as above, hemoglobin improved but somewhat lower than expected.  Fecal occult positive, GI consulted S/p EGD, positive for gastric and duodenal ulcer, active bleeding  Thrombocytopenia-due to liver disease, stable, no bleeding-Hemoccult was  positive  Obesity, morbid-BMI 45.  She would benefit from weight loss   Scheduled Meds:  carvedilol   3.125 mg Oral BID WC   feeding supplement  237 mL Oral BID BM   ferrous sulfate   325 mg Oral Q breakfast   folic acid   1 mg Oral Daily   hydrocortisone   20 mg Oral Daily   midodrine   10 mg  Oral TID WC   pantoprazole   40 mg Oral BID AC   sodium chloride  flush  3 mL Intravenous Q12H   sucralfate   1 g Oral BID WC   Continuous Infusions:  heparin  1,200 Units/hr (04/29/23 1554)   promethazine  (PHENERGAN ) injection (IM or IVPB)     PRN Meds:.fluticasone  furoate-vilanterol, LORazepam , oxyCODONE , promethazine  **OR** promethazine  (PHENERGAN ) injection (IM or IVPB), rOPINIRole   Current Outpatient Medications  Medication Instructions   acetaminophen  (TYLENOL ) 1,500 mg, Oral, Daily PRN, Low back pain   albuterol  (VENTOLIN  HFA) 108 (90 Base) MCG/ACT inhaler 2 puffs, Inhalation, Every 6 hours PRN   b complex vitamins capsule 1 capsule, Oral, Daily   BREO ELLIPTA  100-25 MCG/ACT AEPB 1 puff, Inhalation, As needed   ciprofloxacin  (CIPRO ) 500 mg, Oral, Daily   Cranberry 500 MG CAPS 1 capsule, Oral, Daily   ferrous sulfate  325 mg, Oral, Daily with breakfast   folic acid  (FOLVITE ) 1 mg, Oral, Daily   furosemide  (LASIX ) 60 mg, Oral, Daily   hydrocortisone  (CORTEF ) 10 MG tablet Take 2 tablets (20 mg total) by mouth daily.  Do not stop it abruptly unless told by the endocrinologist.   midodrine  (PROAMATINE ) 10 mg, Oral, 3 times daily with meals   pantoprazole  (PROTONIX ) 40 mg, Oral, 2 times daily before meals   potassium chloride  SA (KLOR-CON  M) 20 MEQ tablet 20 mEq, Oral, 2 times daily   promethazine  (PHENERGAN ) 12.5 mg, Oral, Every 6 hours PRN   rOPINIRole  (REQUIP ) 0.25 mg, Daily at bedtime   spironolactone  (ALDACTONE ) 150 mg, Oral, 3 times daily   sucralfate  (CARAFATE ) 1 g, Oral, 2 times daily   Vitamin D3 1,000 Units, Oral, Daily       DVT prophylaxis: SCDs Start: 04/25/23 0825 on heparin  drip   Lab Results  Component Value Date   PLT 106 (L) 04/30/2023      Code Status: Full Code  Family Communication: No family at bedside  Status is: Inpatient Remains inpatient appropriate because: Severity of illness  Level of care: Telemetry Medical  Consultants:   None  Objective: Vitals:   04/30/23 1020 04/30/23 1025 04/30/23 1030 04/30/23 1035  BP: (!) 98/52 (!) 102/52 (!) 106/56   Pulse: 70 65 63 71  Resp: 19 13 13 20   Temp: 97.9 F (36.6 C)     TempSrc: Temporal     SpO2: 100% 100% 100% 100%  Weight:      Height:        Intake/Output Summary (Last 24 hours) at 04/30/2023 1043 Last data filed at 04/30/2023 1013 Gross per 24 hour  Intake 931.97 ml  Output --  Net 931.97 ml   Wt Readings from Last 3 Encounters:  04/30/23 106.5 kg  03/25/23 110.2 kg  06/30/22 113.8 kg    Examination:      General:  AAO x 3,  cooperative, no distress;   HEENT:  Normocephalic, PERRL, otherwise with in Normal limits   Neuro:  CNII-XII intact. , normal motor and sensation, reflexes intact   Lungs:   Clear to auscultation BL, Respirations unlabored,  No wheezes / crackles  Cardio:  S1/S2, RRR, No murmure, No Rubs or Gallops   Abdomen:  Soft, non-tender, bowel sounds active all four quadrants, no guarding or peritoneal signs.  Muscular  skeletal:  Limited exam -global generalized weaknesses - in bed, able to move all 4 extremities,   2+ pulses,  symmetric, No pitting edema  Skin:  Dry, warm to touch, negative for any Rashes,  Wounds: Please see nursing documentation     Data Reviewed: I have independently reviewed following labs and imaging studies   CBC Recent Labs  Lab 04/25/23 0122 04/25/23 0903 04/27/23 0109 04/27/23 0546 04/27/23 1455 04/28/23 0612 04/29/23 0600 04/30/23 0657  WBC 4.7   < > 3.7* 3.7*  --  4.2 4.1 4.0  HGB 7.3*   < > 7.1* 7.0* 8.1* 7.5* 7.7* 7.7*  HCT 22.9*   < > 22.0* 21.9* 25.0* 23.8* 23.9* 24.4*  PLT 130*   < > 104* 102*  --  109* 107* 106*  MCV 95.4   < > 92.4 92.0  --  92.2 92.3 93.1  MCH 30.4   < > 29.8 29.4  --  29.1 29.7 29.4  MCHC 31.9   < > 32.3 32.0  --  31.5 32.2 31.6  RDW 14.4   < > 14.8 14.9  --  15.8* 15.4 15.5  LYMPHSABS 0.6*  --   --   --   --   --   --   --   MONOABS 0.5  --   --   --    --   --   --   --   EOSABS 0.3  --   --   --   --   --   --   --   BASOSABS 0.0  --   --   --   --   --   --   --    < > = values in this interval not displayed.    Recent Labs  Lab 04/25/23 0122 04/25/23 0903 04/25/23 0907 04/26/23 0528 04/27/23 0546 04/28/23 0612 04/29/23 0600 04/30/23 0657  NA 138   < >  --  138 138 137 138 139  K 2.7*   < >  --  3.1* 3.5 3.8 3.7 3.6  CL 101   < >  --  104 106 106 107 107  CO2 26   < >  --  25 22 23 24 24   GLUCOSE 100*   < >  --  94 94 94 86 84  BUN 25*   < >  --  22 18 16 15 15   CREATININE 2.25*   < >  --  1.89* 1.81* 1.74* 1.73* 1.87*  CALCIUM 8.1*   < >  --  7.8* 8.2* 8.1* 8.2* 8.4*  AST 28  --   --  25 25 23   --   --   ALT 15  --   --  16 14 12   --   --   ALKPHOS 120  --   --  112 101 97  --   --   BILITOT 1.2  --   --  1.0 1.9* 1.0  --   --   ALBUMIN  2.1*  --   --  1.9* 2.8* 2.4*  --   --   MG 1.8  --   --   --  2.2 2.1  --  2.1  INR  --   --  1.3*  --   --   --   --   --  BNP 353.1*  --   --   --   --   --   --   --    < > = values in this interval not displayed.     ------------------------------------------------------------------------------------------------------------------    Component Value Date/Time   BNP 353.1 (H) 04/25/2023 0122    CBG: No results for input(s): "GLUCAP" in the last 168 hours.  Recent Results (from the past 240 hours)  Culture, body fluid w Gram Stain-bottle     Status: None   Collection Time: 04/25/23 12:10 PM   Specimen: Pleura  Result Value Ref Range Status   Specimen Description PLEURAL  Final   Special Requests NONE  Final   Culture   Final    NO GROWTH 5 DAYS Performed at [facility], [address] [place], [place] [address]    Report Status 04/30/2023 FINAL  Final  Gram stain     Status: None   Collection Time: 04/25/23 12:10 PM   Specimen: Pleura  Result Value Ref Range Status   Specimen Description PLEURAL  Final   Special Requests NONE  Final   Gram Stain   Final     NO WBC SEEN NO ORGANISMS SEEN Performed at [facility], [address] [place], [place] [address]    Report Status 04/25/2023 FINAL  Final        SIGNED: Bobbetta Burnet, MD, FHM. FAAFP Total of 55 minutes was spent seeing evaluate patient, reviewing medical records, reviewing meds, labs   [facility],  Pager (please use [email or web address] to page/text)  Please use Epic Secure Chat for non-urgent communication (7AM-7PM) If 7PM-7AM, please contact night-coverage [email or web address],  04/30/2023, 10:44 AM

## 2023-04-30 NOTE — Progress Notes (Signed)
 Mobility Specialist Progress Note:   04/30/23 1300  Mobility  Activity Dangled on edge of bed  Level of Assistance Standby assist, set-up cues, supervision of patient - no hands on  Assistive Device None  Activity Response Tolerated well  Mobility Referral Yes  Mobility visit 1 Mobility  Mobility Specialist Start Time (ACUTE ONLY) 1337  Mobility Specialist Stop Time (ACUTE ONLY) 1340  Mobility Specialist Time Calculation (min) (ACUTE ONLY) 3 min   Pt received in bed and agreeable. Sat EOB w/ no physical assist. Declined further mobility d/t recent trip to [place] and fatigued. Pt left on EOB with call bell and all needs met.  D'Vante Nolon Baxter Mobility Specialist Please contact via Special educational needs teacher or Rehab office at [phone]

## 2023-04-30 NOTE — Interval H&P Note (Signed)
 History and Physical Interval Note:  04/30/2023 9:54 AM  Carla Leonard  has presented today for surgery, with the diagnosis of Anemia and heme positive stool.  The various methods of treatment have been discussed with the patient and family. After consideration of risks, benefits and other options for treatment, the patient has consented to  Procedure(s): EGD (ESOPHAGOGASTRODUODENOSCOPY) (N/A) as a surgical intervention.  The patient's history has been reviewed, patient examined, no change in status, stable for surgery.  I have reviewed the patient's chart and labs.  Questions were answered to the patient's satisfaction.     [name]

## 2023-04-30 NOTE — Transfer of Care (Signed)
 Immediate Anesthesia Transfer of Care Note  Patient: Carla Leonard  Procedure(s) Performed: EGD (ESOPHAGOGASTRODUODENOSCOPY)  Patient Location: PACU  Anesthesia Type:MAC  Level of Consciousness: awake, alert , and oriented  Airway & Oxygen Therapy: Patient Spontanous Breathing and Patient connected to nasal cannula oxygen  Post-op Assessment: Report given to RN, Post -op Vital signs reviewed and stable, and Patient moving all extremities  Post vital signs: Reviewed and stable  Last Vitals:  Vitals Value Taken Time  BP 98/52 04/30/23 1020  Temp    Pulse 68 04/30/23 1021  Resp 16 04/30/23 1021  SpO2 100 % 04/30/23 1021  Vitals shown include unfiled device data.  Last Pain:  Vitals:   04/30/23 1020  TempSrc:   PainSc: 0-No pain         Complications: No notable events documented.

## 2023-04-30 NOTE — Anesthesia Postprocedure Evaluation (Signed)
 Anesthesia Post Note  Patient: Carla Leonard  Procedure(s) Performed: EGD (ESOPHAGOGASTRODUODENOSCOPY)     Patient location during evaluation: Nursing Unit Anesthesia Type: MAC Level of consciousness: awake and patient cooperative Pain management: pain level controlled Vital Signs Assessment: post-procedure vital signs reviewed and stable Respiratory status: spontaneous breathing, nonlabored ventilation and respiratory function stable Cardiovascular status: stable and blood pressure returned to baseline Postop Assessment: no apparent nausea or vomiting Anesthetic complications: no   No notable events documented.  Last Vitals:  Vitals:   04/30/23 1035 04/30/23 1623  BP:  (!) 122/45  Pulse: 71 74  Resp: 20 16  Temp:  36.8 C  SpO2: 100% 100%    Last Pain:  Vitals:   04/30/23 1249  TempSrc:   PainSc: 10-Worst pain ever                 Wyolene Weimann

## 2023-04-30 NOTE — Progress Notes (Addendum)
 PHARMACY - ANTICOAGULATION CONSULT NOTE  Pharmacy Consult for Heparin >>apixaban Indication: DVT  Allergies  Allergen Reactions   Elemental Sulfur Anaphylaxis   Sulfa Antibiotics Anaphylaxis   Prednisone Other (See Comments)    hallucinations   Duloxetine  Other (See Comments)    Per patient   Nsaids Other (See Comments)    Stomach pain, diarrhea   Other Other (See Comments)    Steroids: hallucinations, tolerates hydrocortisone      Patient Measurements: Height: 5\' 1"  (154.9 cm) Weight: 106.5 kg (234 lb 12.6 oz) IBW/kg (Calculated) : 47.8 HEPARIN  DW (KG): 74.5  Vital Signs: Temp: 98.5 F (36.9 C) (04/27 0820) Temp Source: Oral (04/27 0820) BP: 126/46 (04/27 0820) Pulse Rate: 67 (04/27 0820)  Labs: Recent Labs    04/28/23 0612 04/29/23 0600 04/30/23 0657  HGB 7.5* 7.7* 7.7*  HCT 23.8* 23.9* 24.4*  PLT 109* 107* 106*  HEPARINUNFRC 0.36 0.41 0.36  CREATININE 1.74* 1.73* 1.87*    Estimated Creatinine Clearance: 32.9 mL/min (A) (by C-G formula based on SCr of 1.87 mg/dL (H)).  Assessment:  68 yr old female to begin IV Heparin  on 04/26/23 for RLE DVT.  No heparin  bolus due to anemia per MD instructions.  Will target low therapeutic heparin  levels 0.3-0.5 units/ml.  Hx prior DVT in 2023, cirrhosis. Had been on warfarin during that time.  Not on anticoagulation prior to admission.   Heparin  continue to be therapeutic on modified goal. Hgb 7.7, plt ~100k. Plan for EGD per GI.  Addendum Continue heparin  until tomorrow then transition to PO apixaban without load. Pt is s/p EGD>>nonbleeding ulcers  Goal of Therapy:  Monitor platelets by anticoagulation protocol: Yes   Plan:  Continue heparin  drip at 1200 units/hr until 0600 Start apixaban 5mg  PO BID w/o load Monitor for bleeding Rx will follow peripherally  Ivery Marking, PharmD, BCIDP, AAHIVP, CPP Infectious Disease Pharmacist 04/30/2023 8:54 AM

## 2023-04-30 NOTE — Op Note (Signed)
 [facility] Patient Name: Carla Leonard Procedure Date : 04/30/2023 MRN: [id number] Attending MD: Alvis Jourdain , MD, [id number] Date of Birth: 1955-11-03 CSN: [id number] Age: 68 Admit Type: Inpatient Procedure:                Upper GI endoscopy Indications:              Iron deficiency anemia, Heme positive stool Providers:                Alvis Jourdain, MD, Lorenzo Romberg, RN, Tyrus Gallus, Technician Referring MD:              Medicines:                Propofol  per Anesthesia Complications:            No immediate complications. Estimated Blood Loss:     Estimated blood loss: none. Procedure:                Pre-Anesthesia Assessment:                           - Prior to the procedure, a History and Physical                            was performed, and patient medications and                            allergies were reviewed. The patient's tolerance of                            previous anesthesia was also reviewed. The risks                            and benefits of the procedure and the sedation                            options and risks were discussed with the patient.                            All questions were answered, and informed consent                            was obtained. Prior Anticoagulants: The patient has                            taken heparin . ASA Grade Assessment: III - A                            patient with severe systemic disease. After                            reviewing the risks and benefits, the patient was  deemed in satisfactory condition to undergo the                            procedure.                           - Sedation was administered by an anesthesia                            professional. Deep sedation was attained.                           After obtaining informed consent, the endoscope was                            passed under direct vision. Throughout the                             procedure, the patient's blood pressure, pulse, and                            oxygen saturations were monitored continuously. The                            [id number]-H190 ([id number] Olympus endoscope was introduced                            through the mouth, and advanced to the second part                            of duodenum. The upper GI endoscopy was                            accomplished without difficulty. The patient                            tolerated the procedure well. Scope In: Scope Out: Findings:      The esophagus was normal.      Seven non-bleeding cratered and superficial gastric ulcers with a clean       ulcer base (Forrest Class III) were found in the gastric antrum and at       the pylorus. The largest lesion was 10 mm in largest dimension.      One non-bleeding superficial duodenal ulcer with a clean ulcer base       (Forrest Class III) was found in the duodenal bulb. The lesion was 20 mm       in largest dimension.      During this examination there was no clear evidence of esophageal       varices. 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 One of the       ulcers appeared close to be healed. In the duodenal bulb a large       superficial ulcer was identified. Distal to this point the mucosa       appeared "  ischemic". The mucosa in D2 and distally was normal. No active       bleeding was noted inspite of the ulcerations. Heparin  was maintained       for this procedure to achieve a higher yield for identifiying the source       of bleeding. No mucosal biopsies were obtained. Impression:               - Normal esophagus.                           - Non-bleeding gastric ulcers with a clean ulcer                            base (Forrest Class III).                           - Non-bleeding duodenal ulcer with a clean ulcer                            base  (Forrest Class III).                           - No specimens collected. Recommendation:           - Return patient to hospital ward for ongoing care.                           - 2 gram sodium diet.                           - Continue present medications, pantoptrazole BID                            and sucralfate .                           - Check H. pylori Ab.                           - Monitor HGB and transfuse if necessary.                           - Maintain heparin  as long as there is no acute                            drop in her HGB. Procedure Code(s):        --- Professional ---                           [phone], Esophagogastroduodenoscopy, flexible,                            transoral; diagnostic, including collection of                            specimen(s) by brushing or washing, when performed                            (  separate procedure) Diagnosis Code(s):        --- Professional ---                           K25.9, Gastric ulcer, unspecified as acute or                            chronic, without hemorrhage or perforation                           K26.9, Duodenal ulcer, unspecified as acute or                            chronic, without hemorrhage or perforation                           D50.9, Iron deficiency anemia, unspecified                           R19.5, Other fecal abnormalities CPT copyright 2022 [employer]. All rights reserved. The codes documented in this report are preliminary and upon coder review may  be revised to meet current compliance requirements. Alvis Jourdain, MD Alvis Jourdain, MD 04/30/2023 10:26:30 AM This report has been signed electronically. Number of Addenda: 0

## 2023-05-01 ENCOUNTER — Inpatient Hospital Stay (HOSPITAL_COMMUNITY)

## 2023-05-01 DIAGNOSIS — I824Y9 Acute embolism and thrombosis of unspecified deep veins of unspecified proximal lower extremity: Secondary | ICD-10-CM

## 2023-05-01 DIAGNOSIS — K7460 Unspecified cirrhosis of liver: Secondary | ICD-10-CM | POA: Diagnosis not present

## 2023-05-01 DIAGNOSIS — K7290 Hepatic failure, unspecified without coma: Secondary | ICD-10-CM | POA: Diagnosis not present

## 2023-05-01 DIAGNOSIS — K250 Acute gastric ulcer with hemorrhage: Secondary | ICD-10-CM

## 2023-05-01 HISTORY — PX: IR IVC FILTER PLMT / S&I /IMG GUID/MOD SED: IMG701

## 2023-05-01 LAB — HEMOGLOBIN AND HEMATOCRIT, BLOOD
HCT: 23.5 % — ABNORMAL LOW (ref 36.0–46.0)
Hemoglobin: 7.5 g/dL — ABNORMAL LOW (ref 12.0–15.0)

## 2023-05-01 LAB — CBC
HCT: 23.7 % — ABNORMAL LOW (ref 36.0–46.0)
Hemoglobin: 7.4 g/dL — ABNORMAL LOW (ref 12.0–15.0)
MCH: 29.5 pg (ref 26.0–34.0)
MCHC: 31.2 g/dL (ref 30.0–36.0)
MCV: 94.4 fL (ref 80.0–100.0)
Platelets: 109 10*3/uL — ABNORMAL LOW (ref 150–400)
RBC: 2.51 MIL/uL — ABNORMAL LOW (ref 3.87–5.11)
RDW: 15.8 % — ABNORMAL HIGH (ref 11.5–15.5)
WBC: 3.8 10*3/uL — ABNORMAL LOW (ref 4.0–10.5)
nRBC: 0 % (ref 0.0–0.2)

## 2023-05-01 MED ORDER — APIXABAN 5 MG PO TABS
5.0000 mg | ORAL_TABLET | Freq: Two times a day (BID) | ORAL | Status: DC
  Administered 2023-05-02: 5 mg via ORAL
  Filled 2023-05-01: qty 1

## 2023-05-01 MED ORDER — MIDAZOLAM HCL 2 MG/2ML IJ SOLN
INTRAMUSCULAR | Status: AC | PRN
  Administered 2023-05-01: .5 mg via INTRAVENOUS
  Administered 2023-05-01: 1 mg via INTRAVENOUS
  Administered 2023-05-01: .5 mg via INTRAVENOUS

## 2023-05-01 MED ORDER — HEPARIN (PORCINE) 25000 UT/250ML-% IV SOLN: 1200.0000 [IU]/h | INTRAVENOUS | Status: DC

## 2023-05-01 MED ORDER — LIDOCAINE HCL 1 % IJ SOLN
20.0000 mL | Freq: Once | INTRAMUSCULAR | Status: AC
  Administered 2023-05-01: 10 mL

## 2023-05-01 MED ORDER — LIDOCAINE HCL 1 % IJ SOLN
INTRAMUSCULAR | Status: AC
  Filled 2023-05-01: qty 20

## 2023-05-01 MED ORDER — MIDAZOLAM HCL 2 MG/2ML IJ SOLN
INTRAMUSCULAR | Status: AC
  Filled 2023-05-01: qty 4

## 2023-05-01 NOTE — Procedures (Signed)
  Procedure:  IVCgram, IVC filter placement (retrievable)   Preprocedure diagnosis: The primary encounter diagnosis was Ascites due to alcoholic cirrhosis (HCC). Diagnoses of SOB (shortness of breath), Symptomatic anemia, Hypokalemia, and AKI (acute kidney injury) (HCC) were also pertinent to this visit. Postprocedure diagnosis: same EBL:    minimal Complications:   none immediate  See full dictation in [employer].  Nicky Barrack MD Main # [phone] Pager  [phone] Mobile [phone]

## 2023-05-01 NOTE — Plan of Care (Signed)
?  Problem: Clinical Measurements: ?Goal: Will remain free from infection ?Outcome: Completed/Met ?Goal: Diagnostic test results will improve ?Outcome: Completed/Met ?  ?

## 2023-05-01 NOTE — Care Management Important Message (Signed)
 Important Message  Patient Details  Name: Carla Leonard MRN: [id number] Date of Birth: 01-25-1955   Important Message Given:  Yes - Medicare IM     Wynonia Hedges 05/01/2023, 3:52 PM

## 2023-05-01 NOTE — Consult Note (Signed)
 Chief Complaint: DVT; request for image guided IVC filter placement  Referring Provider(s): Dr. Rosena Conradi  Supervising Physician: Marland Silvas  Patient Status: [facility] - In-pt  History of Present Illness: Carla Leonard is a 68 y.o. female with a past medical history significant for cirrhosis with ascites, hypertension, CKD 3, prior DVT, and adrenal insufficiency.  She presented to the ER 04/25/2023 for abdominal swelling, shortness of breath, bilateral leg swelling.  Ultrasound study identified evidence of DVT and was started on heparin . Plan was further complicated by FOBT+ with drop in hgb (8.2 >6.8) during admission requiring 2u PRBC. EGD 04/30/23 notes gastric ulcers. Based on this, oral anticoagulant not recommended at this time. IR consulted for IVC filter placement. She is known to IR from paracenteses, last puncture 04/25/2023.   She tolerated sedation for recent EGD well. Wears dentures. She did eat a few bites of jello and broth just prior to interview. She is sitting on the couch in her room, A&Ox4 at time of interview.    Patient is Full Code  Past Medical History:  Diagnosis Date   Allergy    "spring time",no medications   Anxiety    Arthritis    back,knee,hands   Asthma    "as a child"   Depression    History of kidney stones    Hypertension    pt.denies updated 02/07/22   Insomnia    Seizures (HCC)    last 2 1/2 years ago    Past Surgical History:  Procedure Laterality Date   APPENDECTOMY     BIOPSY  08/09/2021   Procedure: BIOPSY;  Surgeon: Normie Becton., MD;  Location: [facility];  Service: Gastroenterology;;   CHOLECYSTECTOMY     COLONOSCOPY WITH PROPOFOL  N/A 02/24/2022   Procedure: COLONOSCOPY WITH PROPOFOL ;  Surgeon: Daina Drum, MD;  Location: [facility];  Service: Gastroenterology;  Laterality: N/A;   ELBOW SURGERY     RIGHT   ESOPHAGOGASTRODUODENOSCOPY N/A 08/09/2021   Procedure: ESOPHAGOGASTRODUODENOSCOPY (EGD);  Surgeon:  Normie Becton., MD;  Location: [facility];  Service: Gastroenterology;  Laterality: N/A;   ESOPHAGOGASTRODUODENOSCOPY (EGD) WITH PROPOFOL  N/A 06/30/2022   Procedure: ESOPHAGOGASTRODUODENOSCOPY (EGD) WITH PROPOFOL ;  Surgeon: Daina Drum, MD;  Location: [facility];  Service: Gastroenterology;  Laterality: N/A;   EUS N/A 08/09/2021   Procedure: UPPER ENDOSCOPIC ULTRASOUND (EUS) LINEAR ;  Surgeon: Normie Becton., MD;  Location: [facility];  Service: Gastroenterology;  Laterality: N/A;   HEMOSTASIS CLIP PLACEMENT  08/09/2021   Procedure: HEMOSTASIS CLIP PLACEMENT;  Surgeon: Normie Becton., MD;  Location: [facility];  Service: Gastroenterology;;   HEMOSTASIS CLIP PLACEMENT  06/30/2022   Procedure: HEMOSTASIS CLIP PLACEMENT;  Surgeon: Daina Drum, MD;  Location: [facility];  Service: Gastroenterology;;   HOT HEMOSTASIS N/A 08/09/2021   Procedure: HOT HEMOSTASIS (ARGON PLASMA COAGULATION/BICAP);  Surgeon: Normie Becton., MD;  Location: [facility];  Service: Gastroenterology;  Laterality: N/A;   IR PARACENTESIS  04/28/2021   IR PARACENTESIS  05/06/2021   IR PARACENTESIS  05/11/2021   IR PARACENTESIS  04/25/2023   JOINT REPLACEMENT     right TKA 06/2010   KNEE ARTHROPLASTY  06/17/2011   Procedure: COMPUTER ASSISTED TOTAL KNEE ARTHROPLASTY;  Surgeon: Boston Byers, MD;  Location: [facility];  Service: Orthopedics;  Laterality: Left;  TOTAL KNEE REPLACEMENT WITH GENERAL ANESTHESIA AND PRE OP FEMORAL NERVE BLOCK   PERIANAL CYST     POLYPECTOMY  08/09/2021   Procedure: POLYPECTOMY;  Surgeon: Mansouraty,  Albino Alu., MD;  Location: [facility];  Service: Gastroenterology;;   POLYPECTOMY  02/24/2022   Procedure: POLYPECTOMY;  Surgeon: Daina Drum, MD;  Location: [facility];  Service: Gastroenterology;;   POLYPECTOMY  06/30/2022   Procedure: POLYPECTOMY;  Surgeon: Daina Drum, MD;  Location: [facility];  Service: Gastroenterology;;   SUBMUCOSAL TATTOO INJECTION   08/09/2021   Procedure: SUBMUCOSAL TATTOO INJECTION;  Surgeon: Normie Becton., MD;  Location: [facility];  Service: Gastroenterology;;    Allergies: Elemental sulfur, Sulfa antibiotics, Prednisone, Duloxetine , Nsaids, and Other  Medications: Prior to Admission medications   Medication Sig Start Date End Date Taking? Authorizing Provider  acetaminophen  (TYLENOL ) 500 MG tablet Take 1,500 mg by mouth daily as needed. Low back pain   Yes [provider]  albuterol  (VENTOLIN  HFA) 108 (90 Base) MCG/ACT inhaler Inhale 2 puffs into the lungs every 6 (six) hours as needed for wheezing or shortness of breath.   Yes [provider]  b complex vitamins capsule Take 1 capsule by mouth daily.   Yes [provider]  BREO ELLIPTA  100-25 MCG/ACT AEPB Inhale 1 puff into the lungs as needed (SOB and wheezing). 01/19/23  Yes [provider]  Cholecalciferol (VITAMIN D3) 1000 units CAPS Take 1,000 Units by mouth daily.   Yes [provider]  ciprofloxacin  (CIPRO ) 500 MG tablet TAKE 1 TABLET EVERY DAY 03/06/22  Yes Daina Drum, MD  Cranberry 500 MG CAPS Take 1 capsule by mouth daily.   Yes [provider]  ferrous sulfate  325 (65 FE) MG EC tablet Take 325 mg by mouth daily with breakfast.   Yes [provider]  folic acid  (FOLVITE ) 1 MG tablet Take 1 tablet (1 mg total) by mouth daily. 05/12/21  Yes Leona Rake, MD  furosemide  (LASIX ) 20 MG tablet Take 3 tablets (60 mg total) by mouth daily. Patient taking differently: Take 40 mg by mouth daily. 05/18/21  Yes Daina Drum, MD  hydrocortisone  (CORTEF ) 10 MG tablet Take 2 tablets (20 mg total) by mouth daily.  Do not stop it abruptly unless told by the endocrinologist. 04/12/21  Yes Singh, Prashant K, MD  midodrine  (PROAMATINE ) 10 MG tablet Take 1 tablet (10 mg total) by mouth 3 (three) times daily with meals. 04/12/21  Yes Cala Castleman, MD  pantoprazole  (PROTONIX ) 40 MG tablet Take 1  tablet (40 mg total) by mouth 2 (two) times daily before a meal. 05/19/22  Yes Daina Drum, MD  potassium chloride  SA (KLOR-CON  M) 20 MEQ tablet TAKE 1 TABLET TWICE DAILY 06/20/22  Yes Dorsey, Ying C, MD  promethazine  (PHENERGAN ) 12.5 MG tablet Take 1 tablet (12.5 mg total) by mouth every 6 (six) hours as needed for nausea or vomiting. 08/06/21  Yes Daina Drum, MD  rOPINIRole  (REQUIP ) 0.25 MG tablet Take 0.25 mg by mouth at bedtime. May take 1 to 3 hours before bedtime   Yes [provider]  spironolactone  (ALDACTONE ) 50 MG tablet Take 3 tablets (150 mg total) by mouth 3 (three) times daily. Patient taking differently: Take 50 mg by mouth daily. 04/26/22 11/28/23 Yes Daina Drum, MD  sucralfate  (CARAFATE ) 1 GM/10ML suspension TAKE 10 MLS (1 G TOTAL) BY MOUTH 2 (TWO) TIMES DAILY. 11/11/21 11/28/23 Yes Mansouraty, Albino Alu., MD     Family History  Problem Relation Age of Onset   Pneumonia Mother    Diabetes Father    Colon cancer Neg Hx    Esophageal cancer Neg Hx  Stomach cancer Neg Hx    Pancreatic cancer Neg Hx    Colon polyps Neg Hx    Crohn's disease Neg Hx    Rectal cancer Neg Hx    Ulcerative colitis Neg Hx     Social History   Socioeconomic History   Marital status: Divorced    Spouse name: Not on file   Number of children: 1   Years of education: Not on file   Highest education level: Not on file  Occupational History   Occupation: Retired Lawyer  Tobacco Use   Smoking status: Former    Current packs/day: 0.25    Types: Cigarettes   Smokeless tobacco: Never   Tobacco comments:    Former 2 ppd  Vaping Use   Vaping status: Some Days   Devices: Non Nicotine   Substance and Sexual Activity   Alcohol use: Not Currently    Comment: quit 1 year ago updated 02/07/22   Drug use: Not Currently    Types: Marijuana    Comment: quit 7 months ago updated 02/07/22   Sexual activity: Not on file  Other Topics Concern   Not on file  Social History Narrative   Not  on file   Social Drivers of Health   Financial Resource Strain: Not on file  Food Insecurity: No Food Insecurity (04/25/2023)   Hunger Vital Sign    Worried About Running Out of Food in the Last Year: Never true    Ran Out of Food in the Last Year: Never true  Transportation Needs: No Transportation Needs (04/25/2023)   PRAPARE - Administrator, Civil Service (Medical): No    Lack of Transportation (Non-Medical): No  Physical Activity: Not on file  Stress: Not on file  Social Connections: Socially Isolated (04/25/2023)   Social Connection and Isolation Panel [NHANES]    Frequency of Communication with Friends and Family: More than three times a week    Frequency of Social Gatherings with Friends and Family: More than three times a week    Attends Religious Services: Never    Database administrator or Organizations: No    Attends Banker Meetings: Never    Marital Status: Divorced     Review of Systems: A 12 point ROS discussed and pertinent positives are indicated in the HPI above.  All other systems are negative.  Review of Systems  Constitutional:  Negative for chills and fever.  HENT:  Negative for sore throat.   Respiratory:  Negative for shortness of breath.   Cardiovascular:  Negative for chest pain.  Gastrointestinal:  Positive for abdominal distention, blood in stool and nausea. Negative for abdominal pain and vomiting.  Genitourinary:  Negative for hematuria.  Musculoskeletal:        Denies leg pain  Hematological:  Bruises/bleeds easily.    Vital Signs: BP (!) 111/52 (BP Location: Right Arm)   Pulse 77   Temp 98.6 F (37 C) (Oral)   Resp 18   Ht 5\' 1"  (1.549 m)   Wt 234 lb 12.6 oz (106.5 kg)   SpO2 100%   BMI 44.36 kg/m   Advance Care Plan: No documents on file    Physical Exam Constitutional:      Appearance: She is obese.  HENT:     Mouth/Throat:     Mouth: Mucous membranes are moist.     Pharynx: Oropharynx is clear.   Cardiovascular:     Rate and Rhythm: Normal rate and regular rhythm.  Pulses: Normal pulses.     Heart sounds: Normal heart sounds.  Pulmonary:     Effort: Pulmonary effort is normal.     Breath sounds: Normal breath sounds.  Abdominal:     General: There is distension.     Palpations: Abdomen is soft.     Tenderness: There is no abdominal tenderness.  Musculoskeletal:        General: No tenderness.     Comments: BLE edema 1+  Skin:    General: Skin is warm and dry.     Capillary Refill: Capillary refill takes less than 2 seconds.     Comments: Chronic appearing skin changes to bilateral shins consistent with venous insufficiency  Neurological:     Mental Status: She is alert and oriented to person, place, and time.  Psychiatric:        Mood and Affect: Mood normal.        Behavior: Behavior normal.        Thought Content: Thought content normal.        Judgment: Judgment normal.     Imaging: VAS US  LOWER EXTREMITY VENOUS (DVT) Result Date: 04/26/2023 x  Lower Venous DVT Study Patient Name:  SHENG INBODEN  Date of Exam:   04/26/2023 Medical Rec #: [id number]         Accession #:    [id number] Date of Birth: 1956/01/04         Patient Gender: F Patient Age:   68 years Exam Location:  [facility] Procedure:      VAS US  LOWER EXTREMITY VENOUS (DVT) Referring Phys: Manny Sees --------------------------------------------------------------------------------  Indications: Pain, Swelling, Edema, and H/O DVT.  Comparison Study: Previous study on 5.6.2023 Performing Technologist: Ria Chad  Examination Guidelines: A complete evaluation includes B-mode imaging, spectral Doppler, color Doppler, and power Doppler as needed of all accessible portions of each vessel. Bilateral testing is considered an integral part of a complete examination. Limited examinations for reoccurring indications may be performed as noted. The reflux portion of the exam is performed with the patient  in reverse Trendelenburg.  +---------+---------------+---------+-----------+----------+-------------------+ RIGHT    CompressibilityPhasicitySpontaneityPropertiesThrombus Aging      +---------+---------------+---------+-----------+----------+-------------------+ CFV      Partial        Yes      Yes                                      +---------+---------------+---------+-----------+----------+-------------------+ SFJ      Partial        Yes      Yes                                      +---------+---------------+---------+-----------+----------+-------------------+ FV Prox  Partial        Yes      Yes                                      +---------+---------------+---------+-----------+----------+-------------------+ FV Mid   Partial        Yes      Yes                                      +---------+---------------+---------+-----------+----------+-------------------+  FV DistalPartial        Yes      Yes                                      +---------+---------------+---------+-----------+----------+-------------------+ PFV      Full                                                             +---------+---------------+---------+-----------+----------+-------------------+ POP      Partial        Yes      Yes                                      +---------+---------------+---------+-----------+----------+-------------------+ PTV                                                   Not well                                                                  visualized, patent                                                        by color.           +---------+---------------+---------+-----------+----------+-------------------+ PERO                                                  Not well                                                                  visualized, patent                                                         by color.           +---------+---------------+---------+-----------+----------+-------------------+ EIV                     Yes      Yes                                      +---------+---------------+---------+-----------+----------+-------------------+   +---------+---------------+---------+-----------+----------+------------------+  LEFT     CompressibilityPhasicitySpontaneityPropertiesThrombus Aging     +---------+---------------+---------+-----------+----------+------------------+ CFV      Full           Yes      Yes                                     +---------+---------------+---------+-----------+----------+------------------+ SFJ      Full           Yes      Yes                                     +---------+---------------+---------+-----------+----------+------------------+ FV Prox  Full                                                            +---------+---------------+---------+-----------+----------+------------------+ FV Mid   Full                                                            +---------+---------------+---------+-----------+----------+------------------+ FV DistalFull                                                            +---------+---------------+---------+-----------+----------+------------------+ PFV      Full                                                            +---------+---------------+---------+-----------+----------+------------------+ POP      Partial        Yes      Yes                  Fibrous stranding. +---------+---------------+---------+-----------+----------+------------------+ PTV      Full                                                            +---------+---------------+---------+-----------+----------+------------------+ PERO     Full                                                            +---------+---------------+---------+-----------+----------+------------------+  Fibrous stranding note din the left popliteal veins.    Summary: RIGHT: - Findings consistent with acute deep vein thrombosis involving the right common femoral vein, SF junction, right femoral vein, and  right popliteal vein.   LEFT: - There is no evidence of deep vein thrombosis in the lower extremity.  - Fibrous stranding note din the left popliteal veins.  *See table(s) above for measurements and observations. Electronically signed by Genny Kid MD on 04/26/2023 at 4:40:39 PM.    Final    IR Paracentesis Result Date: 04/25/2023 INDICATION: Patient with history of alcoholic cirrhosis and ascites. IR consulted for diagnostic and therapeutic paracentesis. EXAM: ULTRASOUND GUIDED DIAGNOSTIC AND THERAPEUTIC PARACENTESIS MEDICATIONS: 7 mL 1% lidocaine  COMPLICATIONS: None immediate. PROCEDURE: Informed written consent was obtained from the patient after a discussion of the risks, benefits and alternatives to treatment. A timeout was performed prior to the initiation of the procedure. Initial ultrasound scanning demonstrates a moderate amount of ascites within the right lower abdominal quadrant. The right lower abdomen was prepped and draped in the usual sterile fashion. 1% lidocaine  was used for local anesthesia. Following this, a 19 gauge, 7-cm, Yueh catheter was introduced. An ultrasound image was saved for documentation purposes. The paracentesis was performed. The catheter was removed and a dressing was applied. The patient tolerated the procedure well without immediate post procedural complication. A total of approximately 2 liters of clear yellow fluid was removed. Samples were sent to the laboratory as requested by the clinical team. IMPRESSION: Successful ultrasound-guided paracentesis yielding 2 liters liters of peritoneal fluid. Performed by: Wyatt Pommier, PA-C Electronically Signed   By: Myrlene Asper D.O.   On: 04/25/2023 13:28   US  Abdomen Complete Result Date: 04/25/2023 CLINICAL DATA:  Cirrhosis,  ascites EXAM: ABDOMEN ULTRASOUND COMPLETE COMPARISON:  CT abdomen and pelvis with contrast May 11, 2022, abdominal ultrasound March 02, 2022 FINDINGS: Gallbladder: Surgically absent. Common bile duct: Diameter: 11 mm, within normal limits status post cholecystectomy. Liver: Nodular hepatic contour. No focal lesion identified. Increased hepatic parenchymal echogenicity. Portal vein is patent on color Doppler imaging with normal direction of blood flow towards the liver. IVC: No abnormality visualized. Pancreas: Visualized portion unremarkable. Spleen: Splenomegaly, measuring up to 12.5 x 5.9 x 4.4 cm. Right Kidney: Length: 8.4 cm. Echogenicity within normal limits. No mass or hydronephrosis visualized. Left Kidney: Length: 8.8 cm. Echogenicity within normal limits. No mass or hydronephrosis visualized. Abdominal aorta: No aneurysm visualized. Other findings: Partially visualized moderate volume ascites. IMPRESSION: Sequela of cirrhosis including nodular liver contour, moderate volume ascites, and splenomegaly. Electronically Signed   By: Sande Cromer M.D.   On: 04/25/2023 08:05   DG Chest 2 View Result Date: 04/25/2023 CLINICAL DATA:  Shortness of breath EXAM: CHEST - 2 VIEW COMPARISON:  01/07/2023 FINDINGS: Artifact from EKG leads. Normal heart size and mediastinal contours. No acute infiltrate or edema. No effusion or pneumothorax. No acute osseous findings. IMPRESSION: No active cardiopulmonary disease. Electronically Signed   By: Ronnette Coke M.D.   On: 04/25/2023 06:41    Labs:  CBC: Recent Labs    04/28/23 0612 04/29/23 0600 04/30/23 0657 05/01/23 0454 05/01/23 0742  WBC 4.2 4.1 4.0 3.8*  --   HGB 7.5* 7.7* 7.7* 7.4* 7.5*  HCT 23.8* 23.9* 24.4* 23.7* 23.5*  PLT 109* 107* 106* 109*  --     COAGS: Recent Labs    04/25/23 0907  INR 1.3*    BMP: Recent Labs    04/27/23 0546 04/28/23 0612 04/29/23 0600 04/30/23 0657  NA 138 137 138 139  K 3.5 3.8 3.7 3.6  CL 106 106 107  107  CO2 22 23 24 24   GLUCOSE 94 94 86 84  BUN 18 16 15 15   CALCIUM 8.2* 8.1* 8.2* 8.4*  CREATININE 1.81* 1.74* 1.73* 1.87*  GFRNONAA 30* 32* 32* 29*    LIVER FUNCTION TESTS: Recent Labs    04/25/23 0122 04/26/23 0528 04/27/23 0546 04/28/23 0612  BILITOT 1.2 1.0 1.9* 1.0  AST 28 25 25 23   ALT 15 16 14 12   ALKPHOS 120 112 101 97  PROT 5.4* 5.1* 5.4* 4.9*  ALBUMIN  2.1* 1.9* 2.8* 2.4*     Assessment and Plan:  Request for  image guided IVC filter approved by Dr. Marlena Sima and scheduled 05/01/23 with single agent (d/t recent clear liquid oral intake, discussed with Dr. Marlena Sima). No contraindications for procedure identified in ROS or physical exam. Labs reviewed and within acceptable range. MD aware of creat elevation.  VSS, afebrile.  Risks and benefits discussed with the patient including, but not limited to bleeding, infection, contrast induced renal failure, filter fracture or migration which can lead to emergency surgery or even death, strut penetration with damage or irritation to adjacent structures and caval thrombosis.  All of the patient's questions were answered, patient is agreeable to proceed. Consent signed and in chart.     Thank you for allowing our service to participate in LAVISHA CHESBROUGH 's care.    Electronically Signed: Terressa Fess, NP   05/01/2023, 1:26 PM     I spent a total of 20 Minutes    in face to face in clinical consultation, greater than 50% of which was counseling/coordinating care for image guided inferior vena cava filter placement.   (A copy of this note was sent to the referring provider and the time of visit.)

## 2023-05-01 NOTE — TOC Progression Note (Signed)
 Transition of Care [facility]) - Progression Note    Patient Details  Name: Carla Leonard MRN: [id number] Date of Birth: 06/29/1955  [facility] CM/SW Contact  Tom-Sivils, Joretta Eads Daphne, RN Phone Number: 05/01/2023, 4:13 PM  Clinical Narrative:     Doppler to LE showed DVT, started on Heparin  gtt. Patient had a drop in Hgb from 8.2 to 6.8, 2U PRBC was administered. EGD 04/30/23 showed Gastric Ulcers, Oral Anticoagulant not recommended at this time. Patient underwent IVC gram, IVC Filter placement today by IR.   Patient not Medically ready for discharge.  CM will continue to follow as patient progresses with care towards discharge.        Expected Discharge Plan and Services                                               Social Determinants of Health (SDOH) Interventions SDOH Screenings   Food Insecurity: No Food Insecurity (04/25/2023)  Housing: Low Risk  (04/25/2023)  Transportation Needs: No Transportation Needs (04/25/2023)  Utilities: Not At Risk (04/25/2023)  Social Connections: Socially Isolated (04/25/2023)  Tobacco Use: Medium Risk (04/30/2023)    Readmission Risk Interventions    04/26/2023    2:35 PM 04/12/2021   12:25 PM  Readmission Risk Prevention Plan  Transportation Screening Complete Complete  PCP or Specialist Appt within 3-5 Days Complete Complete  HRI or Home Care Consult Complete Complete  Social Work Consult for Recovery Care Planning/Counseling Complete Complete  Palliative Care Screening Not Applicable Not Applicable  Medication Review Oceanographer) Referral to Pharmacy Complete

## 2023-05-01 NOTE — Progress Notes (Signed)
 PROGRESS NOTE  [name]:[id number] DOB: 07-Dec-1955 DOA: 04/25/2023 PCP: Sharma Dears, NP   LOS: 6 days   Brief Narrative / Interim history: Carla Leonard is a  68 year old female with HTN, alcoholic cirrhosis, CKD 3, prior DVT, nonepileptic spells, adrenal insufficiency, who comes to the hospital with complaints of progressive abdominal swelling, similar to a prior episode 2 years ago when she required paracentesis.  She has been taking more furosemide  at home but that did not help.  Also reports recent episode of food poisoning with nausea and vomiting, this is now resolved.  Subjective / 24h Interval events: The patient was seen and examined this morning, hemodynamically stable, no acute distress Head of having any rectal bleed, or melena  Explained to the patient that due to drop in hemoglobin, will hold aspirin and initiation of Eliquis today-will monitor her H&H If complicated by GI bleed we may consider IVC filter placement, explained to the patient    Assesement and Plan: Principal Problem:   Decompensated cirrhosis (HCC) Active Problems:   Acute kidney injury superimposed on chronic kidney disease (HCC)   Elevated troponin   Hypokalemia   Normocytic anemia   Thrombocytopenia (HCC)   Lower extremity edema  Principal problem:   Anemia, acute on chronic  -Complicated by anemia of chronic disease, with underlying liver disease, and gastric/duodenal ulcers --For EGD 04/30/2023- --drop in hemoglobin noted -holding heparin  drip, holding initiation of Eliquis till 05/02/23  -POA on admission found anemic,-Hemoccult initially negative subsequently positive - Hemoglobin dropped to 6.8, s/p 2U PRBC transfusion - Was on heparin  drip for new DVT treatment-pending transition to Eliquis    Latest Ref Rng & Units 05/01/2023    7:42 AM 05/01/2023    4:54 AM 04/30/2023    6:57 AM  CBC  WBC 4.0 - 10.5 K/uL  3.8  4.0   Hemoglobin 12.0 - 15.0 g/dL 7.5  7.4  7.7    Hematocrit 36.0 - 46.0 % 23.5  23.7  24.4   Platelets 150 - 400 K/uL  109  106       Gastric/duodenal ulcers:  - GI consulted. 04/30/2023 s/p EGD: Finding consistent with gastric duodenal ulcers -portal hypertension-no active bleeding - Recommend to continue PPI twice daily, Carafate , advancing diet,  Active problems;   Acute kidney injury on chronic kidney disease stage IIIb -baseline creatinine around 1.4, it was 2.2 on admission.  She received IV fluids, creatinine gradually improving, stabilized Lab Results  Component Value Date   CREATININE 1.87 (H) 04/30/2023   CREATININE 1.73 (H) 04/29/2023   CREATININE 1.74 (H) 04/28/2023     Liver cirrhosis, decompensated-with ascites.  Underwent paracentesis on 4/22 with 2 L of fluid removed.  Fluid analysis with negative Gram stain, no growth, but do not see cell count - She is status post albumin  1/23  Hypokalemia-due to recent illness with nausea, vomiting, poor p.o. intake.  Continue to monitor and replace as indicated  Hypophosphatemia-monitor replace as indicated  Anxiety -patient reports increased anxiety recently due to her fluid overload.  Tells me she takes Ativan  at home, resume that here.  Much better  Elevated troponin-no chest pain, flat, not in a pattern consistent with ACS, likely demand ischemia  Adrenal insufficiency-continue midodrine , hydrocortisone   Lower extremity swelling, history of DVT -Doppler ultrasound positive for DVT.  Case was discussed with Dr. Leonia Raman with gastroenterology, she has a history of EV's as well as GAVE,  - Continue discussion with GI Dr. Nickey Barn and Dr. Bridgett Camps -  regarding DOAC treatment versus IVC filter placement  - Continue IV heparin  for now, received total of 2 units of pRBC as above, hemoglobin improved but somewhat lower than expected.  Fecal occult positive, S/p EGD, positive for gastric and duodenal ulcer, active bleeding   (Given continued drop in H&H, gastric and duodenal  ulcers, history of thrombocytopenia, liver cirrhosis, patient may be a candidate for IVC filter placement then DOAC treatment)   Thrombocytopenia-due to liver disease, stable, no bleeding-Hemoccult was positive  Obesity, morbid-BMI 45.  She would benefit from weight loss   Scheduled Meds:  [START ON 05/02/2023] apixaban  5 mg Oral BID   carvedilol   3.125 mg Oral BID WC   feeding supplement  237 mL Oral BID BM   ferrous sulfate   325 mg Oral BID WC   folic acid   1 mg Oral Daily   hydrocortisone   20 mg Oral Daily   midodrine   10 mg Oral TID WC   pantoprazole   40 mg Oral BID AC   sodium chloride  flush  3 mL Intravenous Q12H   sucralfate   1 g Oral BID WC   Continuous Infusions:  promethazine  (PHENERGAN ) injection (IM or IVPB)     PRN Meds:.fluticasone  furoate-vilanterol, LORazepam , oxyCODONE , promethazine  **OR** promethazine  (PHENERGAN ) injection (IM or IVPB), rOPINIRole   Current Outpatient Medications  Medication Instructions   acetaminophen  (TYLENOL ) 1,500 mg, Oral, Daily PRN, Low back pain   albuterol  (VENTOLIN  HFA) 108 (90 Base) MCG/ACT inhaler 2 puffs, Inhalation, Every 6 hours PRN   b complex vitamins capsule 1 capsule, Oral, Daily   BREO ELLIPTA  100-25 MCG/ACT AEPB 1 puff, Inhalation, As needed   ciprofloxacin  (CIPRO ) 500 mg, Oral, Daily   Cranberry 500 MG CAPS 1 capsule, Oral, Daily   ferrous sulfate  325 mg, Oral, Daily with breakfast   folic acid  (FOLVITE ) 1 mg, Oral, Daily   furosemide  (LASIX ) 60 mg, Oral, Daily   hydrocortisone  (CORTEF ) 10 MG tablet Take 2 tablets (20 mg total) by mouth daily.  Do not stop it abruptly unless told by the endocrinologist.   midodrine  (PROAMATINE ) 10 mg, Oral, 3 times daily with meals   pantoprazole  (PROTONIX ) 40 mg, Oral, 2 times daily before meals   potassium chloride  SA (KLOR-CON  M) 20 MEQ tablet 20 mEq, Oral, 2 times daily   promethazine  (PHENERGAN ) 12.5 mg, Oral, Every 6 hours PRN   rOPINIRole  (REQUIP ) 0.25 mg, Daily at bedtime    spironolactone  (ALDACTONE ) 150 mg, Oral, 3 times daily   sucralfate  (CARAFATE ) 1 g, Oral, 2 times daily   Vitamin D3 1,000 Units, Oral, Daily    Diet Orders (From admission, onward)     Start     Ordered   04/30/23 1140  Diet 2 gram sodium Room service appropriate? Yes; Fluid consistency: Thin  Diet effective now       Question Answer Comment  Room service appropriate? Yes   Fluid consistency: Thin      04/30/23 1139             DVT prophylaxis: SCDs Start: 04/25/23 0825 on heparin  drip apixaban (ELIQUIS) tablet 5 mg   Lab Results  Component Value Date   PLT 109 (L) 05/01/2023      Code Status: Full Code  Family Communication: No family at bedside  Status is: Inpatient Remains inpatient appropriate because: Severity of illness  Level of care: Telemetry Medical  Consultants:  None  Objective: Vitals:   04/30/23 1623 04/30/23 1944 05/01/23 0552 05/01/23 0738  BP: (!) 122/45 (!) 114/45  112/67 (!) 111/52  Pulse: 74 67 76 77  Resp: 16 16 18    Temp: 98.2 F (36.8 C) 98.5 F (36.9 C) 98.6 F (37 C) 98.6 F (37 C)  TempSrc:  Oral Oral Oral  SpO2: 100% 98% 100%   Weight:      Height:        Intake/Output Summary (Last 24 hours) at 05/01/2023 1023 Last data filed at 05/01/2023 4098 Gross per 24 hour  Intake 243.05 ml  Output 50 ml  Net 193.05 ml   Wt Readings from Last 3 Encounters:  04/30/23 106.5 kg  03/25/23 110.2 kg  06/30/22 113.8 kg    Examination:  General:  AAO x 3,  cooperative, no distress;   HEENT:  Normocephalic, PERRL, otherwise with in Normal limits   Neuro:  CNII-XII intact. , normal motor and sensation, reflexes intact   Lungs:   Clear to auscultation BL, Respirations unlabored,  No wheezes / crackles  Cardio:    S1/S2, RRR, No murmure, No Rubs or Gallops   Abdomen:  Soft, non-tender, bowel sounds active all four quadrants, no guarding or peritoneal signs.  Muscular  skeletal:  Limited exam -global generalized weaknesses - in bed,  able to move all 4 extremities,   2+ pulses,  symmetric, No pitting edema  Skin:  Dry, warm to touch, negative for any Rashes,  Wounds: Please see nursing documentation         Data Reviewed: I have independently reviewed following labs and imaging studies   CBC Recent Labs  Lab 04/25/23 0122 04/25/23 0903 04/27/23 0546 04/27/23 1455 04/28/23 0612 04/29/23 0600 04/30/23 0657 05/01/23 0454 05/01/23 0742  WBC 4.7   < > 3.7*  --  4.2 4.1 4.0 3.8*  --   HGB 7.3*   < > 7.0*   < > 7.5* 7.7* 7.7* 7.4* 7.5*  HCT 22.9*   < > 21.9*   < > 23.8* 23.9* 24.4* 23.7* 23.5*  PLT 130*   < > 102*  --  109* 107* 106* 109*  --   MCV 95.4   < > 92.0  --  92.2 92.3 93.1 94.4  --   MCH 30.4   < > 29.4  --  29.1 29.7 29.4 29.5  --   MCHC 31.9   < > 32.0  --  31.5 32.2 31.6 31.2  --   RDW 14.4   < > 14.9  --  15.8* 15.4 15.5 15.8*  --   LYMPHSABS 0.6*  --   --   --   --   --   --   --   --   MONOABS 0.5  --   --   --   --   --   --   --   --   EOSABS 0.3  --   --   --   --   --   --   --   --   BASOSABS 0.0  --   --   --   --   --   --   --   --    < > = values in this interval not displayed.    Recent Labs  Lab 04/25/23 0122 04/25/23 0903 04/25/23 0907 04/26/23 0528 04/27/23 0546 04/28/23 0612 04/29/23 0600 04/30/23 0657  NA 138   < >  --  138 138 137 138 139  K 2.7*   < >  --  3.1* 3.5 3.8 3.7 3.6  CL  101   < >  --  104 106 106 107 107  CO2 26   < >  --  25 22 23 24 24   GLUCOSE 100*   < >  --  94 94 94 86 84  BUN 25*   < >  --  22 18 16 15 15   CREATININE 2.25*   < >  --  1.89* 1.81* 1.74* 1.73* 1.87*  CALCIUM 8.1*   < >  --  7.8* 8.2* 8.1* 8.2* 8.4*  AST 28  --   --  25 25 23   --   --   ALT 15  --   --  16 14 12   --   --   ALKPHOS 120  --   --  112 101 97  --   --   BILITOT 1.2  --   --  1.0 1.9* 1.0  --   --   ALBUMIN  2.1*  --   --  1.9* 2.8* 2.4*  --   --   MG 1.8  --   --   --  2.2 2.1  --  2.1  INR  --   --  1.3*  --   --   --   --   --   BNP 353.1*  --   --   --   --   --    --   --    < > = values in this interval not displayed.     ------------------------------------------------------------------------------------------------------------------    Component Value Date/Time   BNP 353.1 (H) 04/25/2023 0122    CBG: No results for input(s): "GLUCAP" in the last 168 hours.  Recent Results (from the past 240 hours)  Culture, body fluid w Gram Stain-bottle     Status: None   Collection Time: 04/25/23 12:10 PM   Specimen: Pleura  Result Value Ref Range Status   Specimen Description PLEURAL  Final   Special Requests NONE  Final   Culture   Final    NO GROWTH 5 DAYS Performed at [facility], [address] [place], [place] [address]    Report Status 04/30/2023 FINAL  Final  Gram stain     Status: None   Collection Time: 04/25/23 12:10 PM   Specimen: Pleura  Result Value Ref Range Status   Specimen Description PLEURAL  Final   Special Requests NONE  Final   Gram Stain   Final    NO WBC SEEN NO ORGANISMS SEEN Performed at [facility], [address], [place], [place] [address]    Report Status 04/25/2023 FINAL  Final        SIGNED: Bobbetta Burnet, MD, FHM. FAAFP Total of 55 minutes was spent seeing evaluate patient, reviewing medical records, reviewing meds, labs   [facility],  Pager (please use [email or web address] to page/text)  Please use Epic Secure Chat for non-urgent communication (7AM-7PM) If 7PM-7AM, please contact night-coverage [email or web address],  05/01/2023, 10:23 AM

## 2023-05-01 NOTE — Progress Notes (Signed)
 Daily Progress Note  DOA: 04/25/2023 Hospital Day: 7   Chief Complaint: Anemia, FOBT +, cirrhjosis  ASSESSMENT    Brief Narrative:  68 y.o. year old female with multiple medical problems not limited to Etoh cirrhosis, IDA, GAVE, CKD3, adrenal insufficiency, DVT. Admitted 4/22 with abdominal swelling and SHOB. See 4/26 GI consult note for anemia / Heme + stools and cirrhosis  FOBT+ acute on chronic anemia  Non-bleeding gastric ulcers / large duodenal ulcer Ischemic appearing duodenum on EGD Baseline hgb 8.2 >> 6.8 this admission. Hgb stable post 2 u RBCs. Etiology of ulcers probably Naprosyn ( she showed me what she takes a home. Taking Naprosyn 2-3 times a day. 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 No focal liver lesions on US  04/25/23 S/p 2 liter LVP on 4/22 but appears cell count wasn't done. Grams stain negative . Antibiotics were not started  DVT ( this admission) Treated with IV heparin . Starts eliquis tomorrow  AKI on CKD 3b.  Baseline Cr 1.4. Presented with Cr of 2.25. Today:  Creatinine 1.87.    PLAN   --H.pylori stool antigen ordered --Continue Pantoprazole  40 mg BID --Continue Carafate  BID ( for one week total given CKD) --Continue 2 gram Na diet --Continue low dose Coreg  --? Low diuretics when kidney function returns to baseline ( if renal function and BP allows). However, it sounds like there is room for improvement in sodium restriction at home --Sounds like she may be getting an IVC filter this admission   Subjective   No complaints. Came in with dark stools but takes iron so wasn't aware she was  bleeding  Objective   GI Studies:  EGD 04/30/23 --The esophagus was normal. Seven non-bleeding cratered and superficial gastric ulcers with a clean ulcer base (Forrest Class III) were found in the gastric antrum and at the pylorus. During this examination there was no clear evidence of esophageal varices. Portal HTN gastropathy was identified and 7 clean-based benign ulcers were noted in the antrum and the pyloric channel. One of the ulcers appeared close to be healed. In the duodenal bulb a large superficial ulcer was identified. Distal to this point the mucosa appeared "ischemic". The mucosa in D2 and distally was normal. No active bleeding was noted inspite of the ulcerations. Heparin  was maintained for this procedure to achieve a higher yield for identifiying the source of bleeding. No mucosal biopsies were obtained.      Recent Labs    04/29/23 0600 04/30/23 0657 05/01/23 0454 05/01/23 0742  WBC 4.1 4.0 3.8*  --   HGB 7.7* 7.7* 7.4* 7.5*  HCT 23.9* 24.4* 23.7* 23.5*  MCV 92.3 93.1 94.4  --   PLT 107* 106* 109*  --    Recent Labs    04/29/23 0600 04/30/23 0657  NA 138 139  K 3.7 3.6  CL 107 107  CO2 24 24  GLUCOSE 86 84  BUN 15 15  CREATININE 1.73* 1.87*  CALCIUM 8.2* 8.4*   Imaging:  VAS US  LOWER EXTREMITY VENOUS (DVT) x  Lower Venous DVT Study  Patient Name:  Carla Leonard  Date of Exam:  04/26/2023 Medical Rec #: [id number]         Accession #:    [id number] Date of Birth: 03-May-1955         Patient Gender: F Patient Age:   72 years Exam Location:  [facility] Procedure:      VAS US  LOWER EXTREMITY VENOUS (DVT) Referring Phys: Manny Sees  --------------------------------------------------------------------------------   Indications: Pain, Swelling, Edema, and H/O DVT.   Comparison Study: Previous study on 5.6.2023  Performing Technologist: Ria Chad    Examination Guidelines: A complete evaluation includes B-mode imaging, spectral  Doppler, color Doppler, and power Doppler as needed of all accessible portions of each vessel. Bilateral testing is considered an integral part of a complete examination. Limited examinations for reoccurring indications may be performed as noted. The reflux portion of the exam is performed with the patient in reverse Trendelenburg.     +---------+---------------+---------+-----------+----------+-------------------+ RIGHT    CompressibilityPhasicitySpontaneityPropertiesThrombus Aging      +---------+---------------+---------+-----------+----------+-------------------+ CFV      Partial        Yes      Yes                                      +---------+---------------+---------+-----------+----------+-------------------+ SFJ      Partial        Yes      Yes                                      +---------+---------------+---------+-----------+----------+-------------------+ FV Prox  Partial        Yes      Yes                                      +---------+---------------+---------+-----------+----------+-------------------+ FV Mid   Partial        Yes      Yes                                      +---------+---------------+---------+-----------+----------+-------------------+ FV DistalPartial        Yes      Yes                                      +---------+---------------+---------+-----------+----------+-------------------+ PFV      Full                                                             +---------+---------------+---------+-----------+----------+-------------------+ POP      Partial        Yes      Yes                                      +---------+---------------+---------+-----------+----------+-------------------+ PTV  Not well                                                                  visualized, patent                                                         by color.           +---------+---------------+---------+-----------+----------+-------------------+ PERO                                                  Not well                                                                  visualized, patent                                                        by color.           +---------+---------------+---------+-----------+----------+-------------------+ EIV                     Yes      Yes                                      +---------+---------------+---------+-----------+----------+-------------------+        +---------+---------------+---------+-----------+----------+------------------+ LEFT     CompressibilityPhasicitySpontaneityPropertiesThrombus Aging     +---------+---------------+---------+-----------+----------+------------------+ CFV      Full           Yes      Yes                                     +---------+---------------+---------+-----------+----------+------------------+ SFJ      Full           Yes      Yes                                     +---------+---------------+---------+-----------+----------+------------------+ FV Prox  Full                                                            +---------+---------------+---------+-----------+----------+------------------+ FV Mid  Full                                                            +---------+---------------+---------+-----------+----------+------------------+ FV DistalFull                                                            +---------+---------------+---------+-----------+----------+------------------+ PFV      Full                                                            +---------+---------------+---------+-----------+----------+------------------+ POP      Partial        Yes      Yes                  Fibrous  stranding. +---------+---------------+---------+-----------+----------+------------------+ PTV      Full                                                            +---------+---------------+---------+-----------+----------+------------------+ PERO     Full                                                            +---------+---------------+---------+-----------+----------+------------------+  Fibrous stranding note din the left popliteal veins.          Summary: RIGHT: - Findings consistent with acute deep vein thrombosis involving the right common femoral vein, SF junction, right femoral vein, and right popliteal vein.      LEFT:  - There is no evidence of deep vein thrombosis in the lower extremity.   - Fibrous stranding note din the left popliteal veins.    *See table(s) above for measurements and observations.  Electronically signed by Genny Kid MD on 04/26/2023 at 4:40:39 PM.      Final       Scheduled inpatient medications:   [START ON 05/02/2023] apixaban  5 mg Oral BID   carvedilol   3.125 mg Oral BID WC   feeding supplement  237 mL Oral BID BM   ferrous sulfate   325 mg Oral BID WC   folic acid   1 mg Oral Daily   hydrocortisone   20 mg Oral Daily   midodrine   10 mg Oral TID WC   pantoprazole   40 mg Oral BID AC   sodium chloride  flush  3 mL Intravenous Q12H   sucralfate   1 g Oral BID WC   Continuous inpatient infusions:   promethazine  (PHENERGAN ) injection (IM or IVPB)     PRN inpatient medications: fluticasone  furoate-vilanterol, LORazepam , oxyCODONE , promethazine  **OR** promethazine  (PHENERGAN ) injection (IM  or IVPB), rOPINIRole   Vital signs in last 24 hours: Temp:  [98.2 F (36.8 C)-98.6 F (37 C)] 98.6 F (37 C) (04/28 0738) Pulse Rate:  [63-77] 77 (04/28 0738) Resp:  [13-20] 18 (04/28 0552) BP: (102-122)/(45-67) 111/52 (04/28 0738) SpO2:  [98 %-100 %] 100 % (04/28 0552) Last BM Date : 04/29/23  Intake/Output Summary (Last 24  hours) at 05/01/2023 1024 Last data filed at 05/01/2023 0607 Gross per 24 hour  Intake 243.05 ml  Output 50 ml  Net 193.05 ml    Intake/Output from previous day: 04/27 0701 - 04/28 0700 In: 293.1 [I.V.:293.1] Out: 50 [Emesis/NG output:50] Intake/Output this shift: No intake/output data recorded.   Physical Exam:  General: Alert female in NAD Heart:  Regular rate and rhythm.  Pulmonary: Normal respiratory effort Abdomen: Soft, nondistended, nontender. Normal bowel sounds. Extremities:  minimal BLE edema  Neurologic: Alert and oriented Psych: Pleasant. Cooperative     LOS: 6 days   Mai Schwalbe ,NP 05/01/2023, 10:24 AM

## 2023-05-01 NOTE — Discharge Instructions (Signed)
 Information on my medicine - ELIQUIS (apixaban)  This medication education was reviewed with me or my healthcare representative as part of my discharge preparation.    Why was Eliquis prescribed for you? Eliquis was prescribed to treat blood clots that may have been found in the veins of your legs (deep vein thrombosis) or in your lungs (pulmonary embolism) and to reduce the risk of them occurring again.  What do You need to know about Eliquis ? The starting dose for you is ONE 5 mg tablet taken TWICE daily.  Eliquis may be taken with or without food.   Try to take the dose about the same time in the morning and in the evening. If you have difficulty swallowing the tablet whole please discuss with your pharmacist how to take the medication safely.  Take Eliquis exactly as prescribed and DO NOT stop taking Eliquis without talking to the doctor who prescribed the medication.  Stopping may increase your risk of developing a new blood clot.  Refill your prescription before you run out.  After discharge, you should have regular check-up appointments with your healthcare provider that is prescribing your Eliquis.    What do you do if you miss a dose? If a dose of ELIQUIS is not taken at the scheduled time, take it as soon as possible on the same day and twice-daily administration should be resumed. The dose should not be doubled to make up for a missed dose.  Important Safety Information A possible side effect of Eliquis is bleeding. You should call your healthcare provider right away if you experience any of the following: Bleeding from an injury or your nose that does not stop. Unusual colored urine (red or dark brown) or unusual colored stools (red or black). Unusual bruising for unknown reasons. A serious fall or if you hit your head (even if there is no bleeding).  Some medicines may interact with Eliquis and might increase your risk of bleeding or clotting while on Eliquis. To  help avoid this, consult your healthcare provider or pharmacist prior to using any new prescription or non-prescription medications, including herbals, vitamins, non-steroidal anti-inflammatory drugs (NSAIDs) and supplements.  This website has more information on Eliquis (apixaban): http://www.eliquis.com/eliquis/home

## 2023-05-02 ENCOUNTER — Encounter (HOSPITAL_COMMUNITY): Payer: Self-pay | Admitting: Gastroenterology

## 2023-05-02 DIAGNOSIS — K7290 Hepatic failure, unspecified without coma: Secondary | ICD-10-CM | POA: Diagnosis not present

## 2023-05-02 DIAGNOSIS — K7460 Unspecified cirrhosis of liver: Secondary | ICD-10-CM | POA: Diagnosis not present

## 2023-05-02 LAB — CBC
HCT: 23.6 % — ABNORMAL LOW (ref 36.0–46.0)
Hemoglobin: 7.5 g/dL — ABNORMAL LOW (ref 12.0–15.0)
MCH: 29.9 pg (ref 26.0–34.0)
MCHC: 31.8 g/dL (ref 30.0–36.0)
MCV: 94 fL (ref 80.0–100.0)
Platelets: 105 10*3/uL — ABNORMAL LOW (ref 150–400)
RBC: 2.51 MIL/uL — ABNORMAL LOW (ref 3.87–5.11)
RDW: 16.1 % — ABNORMAL HIGH (ref 11.5–15.5)
WBC: 4.4 10*3/uL (ref 4.0–10.5)
nRBC: 0 % (ref 0.0–0.2)

## 2023-05-02 LAB — BASIC METABOLIC PANEL WITH GFR
Anion gap: 5 (ref 5–15)
BUN: 19 mg/dL (ref 8–23)
CO2: 24 mmol/L (ref 22–32)
Calcium: 8.6 mg/dL — ABNORMAL LOW (ref 8.9–10.3)
Chloride: 110 mmol/L (ref 98–111)
Creatinine, Ser: 1.79 mg/dL — ABNORMAL HIGH (ref 0.44–1.00)
GFR, Estimated: 31 mL/min — ABNORMAL LOW (ref >60)
Glucose, Bld: 103 mg/dL — ABNORMAL HIGH (ref 70–99)
Potassium: 3.7 mmol/L (ref 3.5–5.1)
Sodium: 139 mmol/L (ref 135–145)

## 2023-05-02 MED ORDER — FUROSEMIDE 20 MG PO TABS: 20.0000 mg | ORAL_TABLET | Freq: Every day | ORAL | 3 refills | Status: DC

## 2023-05-02 MED ORDER — CARVEDILOL 3.125 MG PO TABS: 3.1250 mg | ORAL_TABLET | Freq: Two times a day (BID) | ORAL | 1 refills | Status: DC

## 2023-05-02 MED ORDER — PANTOPRAZOLE SODIUM 40 MG PO TBEC: 40.0000 mg | DELAYED_RELEASE_TABLET | Freq: Two times a day (BID) | ORAL | 4 refills | Status: DC

## 2023-05-02 NOTE — Discharge Summary (Signed)
 Physician Discharge Summary   Patient: Carla Leonard MRN: [id number] DOB: 1955/10/27  Admit date:     04/25/2023  Discharge date: 05/02/23  Discharge Physician: Bobbetta Burnet   PCP: Sharma Dears, NP   Recommendations at discharge:  Follow-up PCP in 1 week Follow-up with nephrologist in 4-8 weeks. Follow-up with gastroenterologist in 4 to 6 weeks Follow-up with interventional radiologist in 12 weeks for possible removal of IVC filter S/p IVC filter placement, not a candidate for chronic anticoagulation for DVT due to gastroduodenal ulcers, GI bleed, worsening kidney function  Discharge Diagnoses: Principal Problem:   Decompensated cirrhosis (HCC) Active Problems:   Acute kidney injury superimposed on chronic kidney disease (HCC)   Elevated troponin   Hypokalemia   Normocytic anemia   Thrombocytopenia (HCC)   Lower extremity edema   Acute gastric ulcer with hemorrhage   Acute deep vein thrombosis (DVT) of proximal vein of lower extremity (HCC)  Carla Leonard is a  68 year old female with HTN, alcoholic cirrhosis, CKD 3, prior DVT, nonepileptic spells, adrenal insufficiency, who comes to the hospital with complaints of progressive abdominal swelling, similar to a prior episode 2 years ago when she required paracentesis.  She has been taking more furosemide  at home but that did not help.  Also reports recent episode of food poisoning with nausea and vomiting, this is now resolved.     Recently treated with IV heparin  drip for DVT.  Drop in H&H noted, Hemoccult was positive, s/p EGD, finding with multiple gastric and duodenal ulcers. On this admission was transfused with 2U PRBC, hemoglobin stabilized at 7.5. Due to persistent anemia, gastric ulcers, thrombocytopenia-patient was started to be not a candidate for DOAC treatment.  Subsequently proceeded with IVC filter placement.  Patient was finally stabilized hemodynamically and hemoglobin remained stable  7.5. Tolerated procedure well.  Deemed stable to be discharged home.  Per GI recommendation to continue PPI for 12 weeks twice daily thank you daily. He is to avoid NSAIDs Follow-up as outpatient with IR for removal of IVC filter.    -------------------------------------------------------------------------------------------------------------------------------------    Anemia, acute on chronic  -Complicated by anemia of chronic disease, with underlying liver disease, and gastric/duodenal ulcers  EGD 04/30/2023- --drop in hemoglobin noted -holding heparin  drip, holding initiation of Eliquis till 05/02/23   -POA on admission found anemic,-Hemoccult initially negative subsequently positive - Hemoglobin dropped to 6.8, s/p 2U PRBC transfusion - Was on heparin  drip for new DVT treatment-pending transition to Eliquis  After further discussion gastroenterologist team and IR - we concluded that patient is not a candidate for DOAC treatment due to significant gastric and duodenal ulcers, worsening kidney function, GI bleed, acute on chronic anemia Will proceed with IVC filter placement  04/23/2023, s/p successful placement of IVC filter placement    Gastric/duodenal ulcers:  - GI consulted. 04/30/2023 s/p EGD: Finding consistent with gastric duodenal ulcers -portal hypertension-no active bleeding - Recommend to continue PPI twice daily, Carafate , advancing diet,        Acute kidney injury on chronic kidney disease stage IIIb -baseline creatinine around 1.4, it was 2.2 on admission.  She received IV fluids, creatinine gradually improving, stabilized Lab Results  Component Value Date   CREATININE 1.79 (H) 05/02/2023   CREATININE 1.87 (H) 04/30/2023   CREATININE 1.73 (H) 04/29/2023      Liver cirrhosis, decompensated-with ascites.  Underwent paracentesis on 4/22 with 2 L of fluid removed.  Fluid analysis with negative Gram stain, no growth, but do not see cell  count - She is status post  albumin  1/23 - Per GI continue outpatient follow-up for repeat paracentesis as needed.   Hypokalemia-replaced,  hypophosphatemia-monitor replace as indicated   Anxiety -as needed Ativan  at home, resumed.  Much better   Elevated troponin-no chest pain, flat, not in a pattern consistent with ACS, likely demand ischemia   Adrenal insufficiency-continue midodrine , hydrocortisone     Lower extremity swelling, history of DVT -Doppler ultrasound positive for DVT.  Case was discussed with Dr. Leonia Raman with gastroenterology, she has a history of EV's as well as GAVE,   - Cussed with gastroenterology team : GI Dr. Nickey Barn and Dr. Bridgett Camps -regarding DOAC treatment versus IVC filter placement   - Received total of 2 units of pRBC as above, hemoglobin improved but somewhat lower than expected.  Fecal occult positive.  S/p EGD, positive for gastric and duodenal ulcer, no active bleeding     Given continued drop in H&H, gastric and duodenal ulcers, history of thrombocytopenia, liver cirrhosis, we terminal patient is a candidate for IVC filter placement then DOAC treatment Preferred discussion patient is not a candidate for DOAC treatment at this point.  - IR was consulted, s/p placement of IVC on 05/01/2023     Consultants: Gastroenterologist, nephrologist, interventional radiologist Procedures performed: IR placement of IVC filter Disposition: Home health Diet recommendation:  Discharge Diet Orders (From admission, onward)     Start     Ordered   05/02/23 0000  Diet - low sodium heart healthy        05/02/23 0816           Regular diet DISCHARGE MEDICATION: Allergies as of 05/02/2023       Reactions   Elemental Sulfur Anaphylaxis   Sulfa Antibiotics Anaphylaxis   Prednisone Other (See Comments)   hallucinations   Duloxetine  Other (See Comments)   Per patient   Nsaids Other (See Comments)   Stomach pain, diarrhea   Other Other (See Comments)   Steroids: hallucinations, tolerates  hydrocortisone          Medication List     STOP taking these medications    ciprofloxacin  500 MG tablet Commonly known as: CIPRO    spironolactone  50 MG tablet Commonly known as: ALDACTONE        TAKE these medications    acetaminophen  500 MG tablet Commonly known as: TYLENOL  Take 1,500 mg by mouth daily as needed. Low back pain   albuterol  108 (90 Base) MCG/ACT inhaler Commonly known as: VENTOLIN  HFA Inhale 2 puffs into the lungs every 6 (six) hours as needed for wheezing or shortness of breath.   b complex vitamins capsule Take 1 capsule by mouth daily.   Breo Ellipta  100-25 MCG/ACT Aepb Generic drug: fluticasone  furoate-vilanterol Inhale 1 puff into the lungs as needed (SOB and wheezing).   carvedilol  3.125 MG tablet Commonly known as: COREG  Take 1 tablet (3.125 mg total) by mouth 2 (two) times daily with a meal.   Cranberry 500 MG Caps Take 1 capsule by mouth daily.   ferrous sulfate  325 (65 FE) MG EC tablet Take 325 mg by mouth daily with breakfast.   folic acid  1 MG tablet Commonly known as: FOLVITE  Take 1 tablet (1 mg total) by mouth daily.   furosemide  20 MG tablet Commonly known as: Lasix  Take 1 tablet (20 mg total) by mouth daily. What changed: how much to take   hydrocortisone  10 MG tablet Commonly known as: CORTEF  Take 2 tablets (20 mg total) by mouth daily.  Do not  stop it abruptly unless told by the endocrinologist.   midodrine  10 MG tablet Commonly known as: PROAMATINE  Take 1 tablet (10 mg total) by mouth 3 (three) times daily with meals.   pantoprazole  40 MG tablet Commonly known as: PROTONIX  Take 1 tablet (40 mg total) by mouth 2 (two) times daily. What changed: when to take this   potassium chloride  SA 20 MEQ tablet Commonly known as: KLOR-CON  M TAKE 1 TABLET TWICE DAILY   promethazine  12.5 MG tablet Commonly known as: PHENERGAN  Take 1 tablet (12.5 mg total) by mouth every 6 (six) hours as needed for nausea or vomiting.    rOPINIRole  0.25 MG tablet Commonly known as: REQUIP  Take 0.25 mg by mouth at bedtime. May take 1 to 3 hours before bedtime   sucralfate  1 GM/10ML suspension Commonly known as: CARAFATE  TAKE 10 MLS (1 G TOTAL) BY MOUTH 2 (TWO) TIMES DAILY.   Vitamin D3 1000 units Caps Take 1,000 Units by mouth daily.        Discharge Exam: Filed Weights   04/25/23 0107 04/28/23 2030 04/30/23 0445  Weight: 108.9 kg 107.1 kg 106.5 kg        General:  AAO x 3,  cooperative, no distress;   HEENT:  Normocephalic, PERRL, otherwise with in Normal limits   Neuro:  CNII-XII intact. , normal motor and sensation, reflexes intact   Lungs:   Clear to auscultation BL, Respirations unlabored,  No wheezes / crackles  Cardio:    S1/S2, RRR, No murmure, No Rubs or Gallops   Abdomen:  Soft, non-tender, bowel sounds active all four quadrants, no guarding or peritoneal signs.  Muscular  skeletal:  Limited exam -global generalized weaknesses - in bed, able to move all 4 extremities,   2+ pulses,  symmetric, No pitting edema  Skin:  Dry, warm to touch, negative for any Rashes,  Wounds: Please see nursing documentation          Condition at discharge: fair  The results of significant diagnostics from this hospitalization (including imaging, microbiology, ancillary and laboratory) are listed below for reference.   Imaging Studies: IR IVC FILTER PLMT / S&I Dan Dun GUID/MOD SED Result Date: 05/01/2023 CLINICAL DATA:  Lower extremity DVT. GI bleeding, a relative contraindication to anticoagulation. Caval filtration requested. Renal insufficiency. EXAM: INFERIOR VENACAVOGRAM IVC FILTER PLACEMENT UNDER FLUOROSCOPY FLUOROSCOPY: Radiation Exposure Index (as provided by the fluoroscopic device): 45.1 mGy air Kerma TECHNIQUE: Patency of the right IJ vein was confirmed with ultrasound with image documentation. An appropriate skin site was determined. Skin site was marked, prepped with chlorhexidine , and draped using  maximum barrier technique. The region was infiltrated locally with 1% lidocaine . Intravenous versed  2mg  were administered by RN during a total moderate (conscious) sedation time of 19 minutes; the patient's level of consciousness and physiological / cardiorespiratory status were monitored continuously by radiology RN under my direct supervision. Under real-time ultrasound guidance, the right IJ vein was accessed with a 21 gauge micropuncture needle; the needle tip within the vein was confirmed with ultrasound image documentation. The needle was exchanged over a 018 guidewire for a transitional dilator, which allow advancement of the [facility] wire into the IVC. A long 6 French vascular sheath was placed for inferior venacavography, using CO2. This demonstrated no caval thrombus or duplication. Renal vein inflows were evident. The retrievable Denali IVC filter was advanced through the sheath and successfully deployed under fluoroscopy at the L2 infrarenal level. Followup cavagram demonstrates stable filter position and no evident complication. The sheath  was removed and hemostasis achieved at the site. No immediate complication. IMPRESSION: 1. Normal IVC. No thrombus or significant anatomic variation. 2. Technically successful infrarenal IVC filter placement. This is a retrievable model. PLAN: This IVC filter is potentially retrievable. The patient will be assessed for filter retrieval by Interventional Radiology in approximately 8-12 weeks. Further recommendations regarding filter retrieval, continued surveillance or declaration of device permanence, will be made at that time. Electronically Signed   By: Nicoletta Barrier M.D.   On: 05/01/2023 16:09   VAS US  LOWER EXTREMITY VENOUS (DVT) Result Date: 04/26/2023 x  Lower Venous DVT Study Patient Name:  TU FERENCE  Date of Exam:   04/26/2023 Medical Rec #: [id number]         Accession #:    [id number] Date of Birth: [birth year]         Patient Gender: F Patient Age:   7  years Exam Location:  [facility] Procedure:      VAS US  LOWER EXTREMITY VENOUS (DVT) Referring Phys: Manny Sees --------------------------------------------------------------------------------  Indications: Pain, Swelling, Edema, and H/O DVT.  Comparison Study: Previous study on 5.6.2023 Performing Technologist: Ria Chad  Examination Guidelines: A complete evaluation includes B-mode imaging, spectral Doppler, color Doppler, and power Doppler as needed of all accessible portions of each vessel. Bilateral testing is considered an integral part of a complete examination. Limited examinations for reoccurring indications may be performed as noted. The reflux portion of the exam is performed with the patient in reverse Trendelenburg.  +---------+---------------+---------+-----------+----------+-------------------+ RIGHT    CompressibilityPhasicitySpontaneityPropertiesThrombus Aging      +---------+---------------+---------+-----------+----------+-------------------+ CFV      Partial        Yes      Yes                                      +---------+---------------+---------+-----------+----------+-------------------+ SFJ      Partial        Yes      Yes                                      +---------+---------------+---------+-----------+----------+-------------------+ FV Prox  Partial        Yes      Yes                                      +---------+---------------+---------+-----------+----------+-------------------+ FV Mid   Partial        Yes      Yes                                      +---------+---------------+---------+-----------+----------+-------------------+ FV DistalPartial        Yes      Yes                                      +---------+---------------+---------+-----------+----------+-------------------+ PFV      Full                                                              +---------+---------------+---------+-----------+----------+-------------------+  POP      Partial        Yes      Yes                                      +---------+---------------+---------+-----------+----------+-------------------+ PTV                                                   Not well                                                                  visualized, patent                                                        by color.           +---------+---------------+---------+-----------+----------+-------------------+ PERO                                                  Not well                                                                  visualized, patent                                                        by color.           +---------+---------------+---------+-----------+----------+-------------------+ EIV                     Yes      Yes                                      +---------+---------------+---------+-----------+----------+-------------------+   +---------+---------------+---------+-----------+----------+------------------+ LEFT     CompressibilityPhasicitySpontaneityPropertiesThrombus Aging     +---------+---------------+---------+-----------+----------+------------------+ CFV      Full           Yes      Yes                                     +---------+---------------+---------+-----------+----------+------------------+ SFJ      Full           Yes      Yes                                     +---------+---------------+---------+-----------+----------+------------------+  FV Prox  Full                                                            +---------+---------------+---------+-----------+----------+------------------+ FV Mid   Full                                                            +---------+---------------+---------+-----------+----------+------------------+ FV  DistalFull                                                            +---------+---------------+---------+-----------+----------+------------------+ PFV      Full                                                            +---------+---------------+---------+-----------+----------+------------------+ POP      Partial        Yes      Yes                  Fibrous stranding. +---------+---------------+---------+-----------+----------+------------------+ PTV      Full                                                            +---------+---------------+---------+-----------+----------+------------------+ PERO     Full                                                            +---------+---------------+---------+-----------+----------+------------------+ Fibrous stranding note din the left popliteal veins.    Summary: RIGHT: - Findings consistent with acute deep vein thrombosis involving the right common femoral vein, SF junction, right femoral vein, and right popliteal vein.   LEFT: - There is no evidence of deep vein thrombosis in the lower extremity.  - Fibrous stranding note din the left popliteal veins.  *See table(s) above for measurements and observations. Electronically signed by Genny Kid MD on 04/26/2023 at 4:40:39 PM.    Final    IR Paracentesis Result Date: 04/25/2023 INDICATION: Patient with history of alcoholic cirrhosis and ascites. IR consulted for diagnostic and therapeutic paracentesis. EXAM: ULTRASOUND GUIDED DIAGNOSTIC AND THERAPEUTIC PARACENTESIS MEDICATIONS: 7 mL 1% lidocaine  COMPLICATIONS: None immediate. PROCEDURE: Informed written consent was obtained from the patient after a discussion of the risks, benefits and alternatives to treatment. A timeout was performed prior to the initiation of the procedure. Initial ultrasound scanning demonstrates a moderate amount of ascites within the right  lower abdominal quadrant. The right lower abdomen was prepped and  draped in the usual sterile fashion. 1% lidocaine  was used for local anesthesia. Following this, a 19 gauge, 7-cm, Yueh catheter was introduced. An ultrasound image was saved for documentation purposes. The paracentesis was performed. The catheter was removed and a dressing was applied. The patient tolerated the procedure well without immediate post procedural complication. A total of approximately 2 liters of clear yellow fluid was removed. Samples were sent to the laboratory as requested by the clinical team. IMPRESSION: Successful ultrasound-guided paracentesis yielding 2 liters liters of peritoneal fluid. Performed by: Wyatt Pommier, PA-C Electronically Signed   By: Myrlene Asper D.O.   On: 04/25/2023 13:28   US  Abdomen Complete Result Date: 04/25/2023 CLINICAL DATA:  Cirrhosis, ascites EXAM: ABDOMEN ULTRASOUND COMPLETE COMPARISON:  CT abdomen and pelvis with contrast May 11, 2022, abdominal ultrasound March 02, 2022 FINDINGS: Gallbladder: Surgically absent. Common bile duct: Diameter: 11 mm, within normal limits status post cholecystectomy. Liver: Nodular hepatic contour. No focal lesion identified. Increased hepatic parenchymal echogenicity. Portal vein is patent on color Doppler imaging with normal direction of blood flow towards the liver. IVC: No abnormality visualized. Pancreas: Visualized portion unremarkable. Spleen: Splenomegaly, measuring up to 12.5 x 5.9 x 4.4 cm. Right Kidney: Length: 8.4 cm. Echogenicity within normal limits. No mass or hydronephrosis visualized. Left Kidney: Length: 8.8 cm. Echogenicity within normal limits. No mass or hydronephrosis visualized. Abdominal aorta: No aneurysm visualized. Other findings: Partially visualized moderate volume ascites. IMPRESSION: Sequela of cirrhosis including nodular liver contour, moderate volume ascites, and splenomegaly. Electronically Signed   By: Sande Cromer M.D.   On: 04/25/2023 08:05   DG Chest 2 View Result Date:  04/25/2023 CLINICAL DATA:  Shortness of breath EXAM: CHEST - 2 VIEW COMPARISON:  01/07/2023 FINDINGS: Artifact from EKG leads. Normal heart size and mediastinal contours. No acute infiltrate or edema. No effusion or pneumothorax. No acute osseous findings. IMPRESSION: No active cardiopulmonary disease. Electronically Signed   By: Ronnette Coke M.D.   On: 04/25/2023 06:41    Microbiology: Results for orders placed or performed during the hospital encounter of 04/25/23  Culture, body fluid w Gram Stain-bottle     Status: None   Collection Time: 04/25/23 12:10 PM   Specimen: Pleura  Result Value Ref Range Status   Specimen Description PLEURAL  Final   Special Requests NONE  Final   Culture   Final    NO GROWTH 5 DAYS Performed at [facility], [address], [place], [place] [address]    Report Status 04/30/2023 FINAL  Final  Gram stain     Status: None   Collection Time: 04/25/23 12:10 PM   Specimen: Pleura  Result Value Ref Range Status   Specimen Description PLEURAL  Final   Special Requests NONE  Final   Gram Stain   Final    NO WBC SEEN NO ORGANISMS SEEN Performed at [facility], [address] [place], [place] [address]    Report Status 04/25/2023 FINAL  Final    Labs: CBC: Recent Labs  Lab 04/28/23 0612 04/29/23 0600 04/30/23 0657 05/01/23 0454 05/01/23 0742 05/02/23 0441  WBC 4.2 4.1 4.0 3.8*  --  4.4  HGB 7.5* 7.7* 7.7* 7.4* 7.5* 7.5*  HCT 23.8* 23.9* 24.4* 23.7* 23.5* 23.6*  MCV 92.2 92.3 93.1 94.4  --  94.0  PLT 109* 107* 106* 109*  --  105*   Basic Metabolic Panel: Recent Labs  Lab  04/27/23 0546 04/28/23 0612 04/29/23 0600 04/30/23 0657 05/02/23 0441  NA 138 137 138 139 139  K 3.5 3.8 3.7 3.6 3.7  CL 106 106 107 107 110  CO2 22 23 24 24 24   GLUCOSE 94 94 86 84 103*  BUN 18 16 15 15 19   CREATININE 1.81* 1.74* 1.73* 1.87* 1.79*  CALCIUM 8.2* 8.1* 8.2* 8.4* 8.6*  MG 2.2 2.1  --  2.1  --   PHOS 1.6* 2.2*  --   --   --    Liver  Function Tests: Recent Labs  Lab 04/26/23 0528 04/27/23 0546 04/28/23 0612  AST 25 25 23   ALT 16 14 12   ALKPHOS 112 101 97  BILITOT 1.0 1.9* 1.0  PROT 5.1* 5.4* 4.9*  ALBUMIN  1.9* 2.8* 2.4*   CBG: No results for input(s): "GLUCAP" in the last 168 hours.  Discharge time spent: greater than 30 minutes.  Signed: Bobbetta Burnet, MD [facility] 05/02/2023

## 2023-05-02 NOTE — TOC Transition Note (Addendum)
 Transition of Care [facility]) - Discharge Note   Patient Details  Name: Carla Leonard MRN: [id number] Date of Birth: Jul 26, 1955  Transition of Care [facility]) CM/SW Contact:  Tom-Holik, Angelique Ken, RN Phone Number: 05/02/2023, 9:24 AM   Clinical Narrative:     Patient is scheduled for discharge today.  Readmission Risk Assessment done. Discharge instructions on AVS. Patient requested for a cab, voucher will be given to patient at the discharge lounge. No further TOC needs noted.       Final next level of care: Home/Self Care Barriers to Discharge: Barriers Resolved   Patient Goals and CMS Choice Patient states their goals for this hospitalization and ongoing recovery are:: To return home CMS Medicare.gov Compare Post Acute Care list provided to:: Patient Choice offered to / list presented to : Patient      Discharge Placement                Patient to be transferred to facility by: [facility]      Discharge Plan and Services Additional resources added to the After Visit Summary for                  DME Arranged: N/A DME Agency: NA       HH Arranged: NA HH Agency: NA        Social Drivers of Health (SDOH) Interventions SDOH Screenings   Food Insecurity: No Food Insecurity (04/25/2023)  Housing: Low Risk  (04/25/2023)  Transportation Needs: No Transportation Needs (04/25/2023)  Utilities: Not At Risk (04/25/2023)  Social Connections: Socially Isolated (04/25/2023)  Tobacco Use: Medium Risk (04/30/2023)     Readmission Risk Interventions    05/02/2023    9:23 AM 04/26/2023    2:35 PM 04/12/2021   12:25 PM  Readmission Risk Prevention Plan  Transportation Screening Complete Complete Complete  PCP or Specialist Appt within 3-5 Days Complete Complete Complete  HRI or Home Care Consult Complete Complete Complete  Social Work Consult for Recovery Care Planning/Counseling Complete Complete Complete  Palliative Care Screening Not Applicable Not Applicable Not  Applicable  Medication Review (RN Care Manager) Referral to Pharmacy Referral to Pharmacy Complete

## 2023-05-03 ENCOUNTER — Telehealth: Payer: Self-pay

## 2023-05-03 NOTE — Telephone Encounter (Signed)
 I left a message stating that per Dr. Sirivol it would be ok to schedule a new patient appointment.  I have removed the "blocks" on the account so if the patient calls back she should be able to schedule a new patient appointment with Dr. Sirivol Waiting for the patient to call back to make this new patient appointment.

## 2023-05-03 NOTE — Telephone Encounter (Signed)
 Copied from CRM [id number]. Topic: Appointments - Appointment Scheduling >> May 03, 2023 10:06 AM [name] wrote: New patient appt.Carla Leonard to schedule - system wouldn't allow.  Pls schedule and call her number on file is good

## 2023-05-03 NOTE — Telephone Encounter (Signed)
 I called Carla Leonard this morning to discuss why the agent was unable to schedule a new patient appointment.  Carla Leonard was notified that she no showed for a new patient appointment that was scheduled back in February of 2025. Carla Leonard said that she has been in/out of the hospital and that was probably why. I asked if she remembered what hospital she was seen at back in February, she said [facility]. I did not see where she was seen at the hospital during that time. Carla Leonard was notified of our office policy for new patients who no show. Carla Leonard said that she relies on her daughter who brings her and she will not miss another appointment.    Dr. Sirivol, please advise if you want to give this patient another chance.

## 2023-05-09 ENCOUNTER — Other Ambulatory Visit (HOSPITAL_COMMUNITY): Payer: Self-pay

## 2023-05-09 DIAGNOSIS — K7031 Alcoholic cirrhosis of liver with ascites: Secondary | ICD-10-CM

## 2023-05-10 ENCOUNTER — Ambulatory Visit (HOSPITAL_COMMUNITY)
Admission: RE | Admit: 2023-05-10 | Discharge: 2023-05-10 | Disposition: A | Discharge status: Home / Self Care | Source: Ambulatory Visit

## 2023-05-10 ENCOUNTER — Encounter (HOSPITAL_COMMUNITY): Payer: Self-pay

## 2023-05-12 ENCOUNTER — Ambulatory Visit (HOSPITAL_COMMUNITY)
Admission: RE | Admit: 2023-05-12 | Discharge: 2023-05-12 | Disposition: A | Discharge status: Home / Self Care | Source: Ambulatory Visit

## 2023-05-12 ENCOUNTER — Other Ambulatory Visit (HOSPITAL_COMMUNITY): Payer: Self-pay

## 2023-05-12 DIAGNOSIS — K7031 Alcoholic cirrhosis of liver with ascites: Secondary | ICD-10-CM | POA: Insufficient documentation

## 2023-05-12 MED ORDER — LIDOCAINE HCL 1 % IJ SOLN
INTRAMUSCULAR | Status: AC
  Filled 2023-05-12: qty 20

## 2023-05-12 NOTE — CV Procedure (Signed)
 Patient presents for paracentesis. US  limited abdomen shows trace amount of ascites fluid noted  Insufficient to perform a safe paracentesis. Procedure not performed.

## 2023-05-24 NOTE — Progress Notes (Deleted)
 Carla Leonard

## 2023-05-25 ENCOUNTER — Ambulatory Visit: Admitting: Gastroenterology

## 2023-06-01 ENCOUNTER — Other Ambulatory Visit (HOSPITAL_COMMUNITY): Payer: Self-pay | Admitting: Interventional Radiology

## 2023-06-01 DIAGNOSIS — K7031 Alcoholic cirrhosis of liver with ascites: Secondary | ICD-10-CM

## 2023-06-02 ENCOUNTER — Ambulatory Visit (HOSPITAL_COMMUNITY)
Admission: RE | Admit: 2023-06-02 | Discharge: 2023-06-02 | Disposition: A | Discharge status: Home / Self Care | Source: Ambulatory Visit | Attending: Interventional Radiology | Admitting: Interventional Radiology

## 2023-06-02 DIAGNOSIS — K7031 Alcoholic cirrhosis of liver with ascites: Secondary | ICD-10-CM | POA: Diagnosis present

## 2023-06-02 MED ORDER — LIDOCAINE HCL 1 % IJ SOLN
INTRAMUSCULAR | Status: AC
  Filled 2023-06-02: qty 20

## 2023-06-02 NOTE — Procedures (Signed)
 PROCEDURE SUMMARY:  Successful ultrasound guided therapeutic paracentesis from the left upper to mid quadrant.  Yielded 1.4 liters of light yellow fluid.  No immediate complications.  The patient tolerated the procedure well.     EBL < 5mL     Wash Hack

## 2023-07-25 ENCOUNTER — Other Ambulatory Visit (HOSPITAL_COMMUNITY): Payer: Self-pay

## 2023-07-25 DIAGNOSIS — K7031 Alcoholic cirrhosis of liver with ascites: Secondary | ICD-10-CM

## 2023-07-26 ENCOUNTER — Other Ambulatory Visit (HOSPITAL_COMMUNITY): Payer: Self-pay

## 2023-07-26 ENCOUNTER — Ambulatory Visit (HOSPITAL_COMMUNITY)
Admission: RE | Admit: 2023-07-26 | Discharge: 2023-07-26 | Disposition: A | Discharge status: Home / Self Care | Source: Ambulatory Visit

## 2023-07-26 DIAGNOSIS — K7031 Alcoholic cirrhosis of liver with ascites: Secondary | ICD-10-CM | POA: Diagnosis present

## 2023-07-26 MED ORDER — LIDOCAINE HCL 1 % IJ SOLN
INTRAMUSCULAR | Status: AC
  Filled 2023-07-26: qty 20

## 2023-07-26 NOTE — Procedures (Signed)
 Patient presents for therapeutic paracentesis. US  limited abdomen shows no amount of peritoneal fluid noted; severe anasarca visualized.   Insufficient to perform a safe paracentesis. Procedure not performed.  Thank you for allowing our service to participate in Carla Leonard 's care.  Electronically Signed: Lavanda JAYSON Jurist, PA-C   07/26/2023, 1:21 PM

## 2023-08-18 ENCOUNTER — Other Ambulatory Visit: Payer: Self-pay

## 2023-08-18 ENCOUNTER — Emergency Department (HOSPITAL_COMMUNITY)

## 2023-08-18 ENCOUNTER — Observation Stay (HOSPITAL_COMMUNITY)
Admission: EM | Admit: 2023-08-18 | Discharge: 2023-08-20 | Disposition: A | Discharge status: Home / Self Care | Attending: Internal Medicine | Admitting: Internal Medicine

## 2023-08-18 ENCOUNTER — Encounter (HOSPITAL_COMMUNITY): Payer: Self-pay

## 2023-08-18 DIAGNOSIS — E2740 Unspecified adrenocortical insufficiency: Secondary | ICD-10-CM | POA: Diagnosis present

## 2023-08-18 DIAGNOSIS — I129 Hypertensive chronic kidney disease with stage 1 through stage 4 chronic kidney disease, or unspecified chronic kidney disease: Secondary | ICD-10-CM | POA: Insufficient documentation

## 2023-08-18 DIAGNOSIS — E271 Primary adrenocortical insufficiency: Secondary | ICD-10-CM | POA: Insufficient documentation

## 2023-08-18 DIAGNOSIS — S32009A Unspecified fracture of unspecified lumbar vertebra, initial encounter for closed fracture: Principal | ICD-10-CM | POA: Insufficient documentation

## 2023-08-18 DIAGNOSIS — K7460 Unspecified cirrhosis of liver: Secondary | ICD-10-CM | POA: Insufficient documentation

## 2023-08-18 DIAGNOSIS — N184 Chronic kidney disease, stage 4 (severe): Secondary | ICD-10-CM | POA: Diagnosis not present

## 2023-08-18 DIAGNOSIS — I959 Hypotension, unspecified: Secondary | ICD-10-CM | POA: Diagnosis not present

## 2023-08-18 DIAGNOSIS — R7989 Other specified abnormal findings of blood chemistry: Secondary | ICD-10-CM | POA: Insufficient documentation

## 2023-08-18 DIAGNOSIS — K7031 Alcoholic cirrhosis of liver with ascites: Secondary | ICD-10-CM | POA: Diagnosis present

## 2023-08-18 DIAGNOSIS — M545 Low back pain, unspecified: Secondary | ICD-10-CM | POA: Diagnosis present

## 2023-08-18 DIAGNOSIS — K219 Gastro-esophageal reflux disease without esophagitis: Secondary | ICD-10-CM | POA: Diagnosis present

## 2023-08-18 DIAGNOSIS — I480 Paroxysmal atrial fibrillation: Secondary | ICD-10-CM | POA: Diagnosis not present

## 2023-08-18 DIAGNOSIS — Z515 Encounter for palliative care: Secondary | ICD-10-CM

## 2023-08-18 DIAGNOSIS — W19XXXA Unspecified fall, initial encounter: Principal | ICD-10-CM | POA: Diagnosis present

## 2023-08-18 DIAGNOSIS — R21 Rash and other nonspecific skin eruption: Secondary | ICD-10-CM | POA: Diagnosis present

## 2023-08-18 DIAGNOSIS — D61818 Other pancytopenia: Secondary | ICD-10-CM | POA: Diagnosis not present

## 2023-08-18 DIAGNOSIS — R0902 Hypoxemia: Secondary | ICD-10-CM | POA: Diagnosis not present

## 2023-08-18 DIAGNOSIS — Z86718 Personal history of other venous thrombosis and embolism: Secondary | ICD-10-CM | POA: Insufficient documentation

## 2023-08-18 DIAGNOSIS — F12920 Cannabis use, unspecified with intoxication, uncomplicated: Secondary | ICD-10-CM | POA: Diagnosis not present

## 2023-08-18 DIAGNOSIS — N179 Acute kidney failure, unspecified: Secondary | ICD-10-CM

## 2023-08-18 DIAGNOSIS — I4891 Unspecified atrial fibrillation: Secondary | ICD-10-CM | POA: Diagnosis present

## 2023-08-18 NOTE — ED Triage Notes (Signed)
 Pt coming in from home where she fell after losing her balance. Pt denies thinners denies hitting head denies loc. Pt reporting back pain at this time.Pt has a history of back problems. Pt was recently placed on hospice for liver issues. Granddaughter and daughter care for her at home. Pt walks with walker. Pt also has home health nurse who also cares for her. Pt has a known left wrist fracture.   Bp 110/60  Pulse 108 Spo2 98%  rr 20

## 2023-08-19 ENCOUNTER — Emergency Department (HOSPITAL_COMMUNITY)

## 2023-08-19 DIAGNOSIS — I959 Hypotension, unspecified: Secondary | ICD-10-CM

## 2023-08-19 DIAGNOSIS — M545 Low back pain, unspecified: Secondary | ICD-10-CM | POA: Diagnosis present

## 2023-08-19 DIAGNOSIS — Z86718 Personal history of other venous thrombosis and embolism: Secondary | ICD-10-CM

## 2023-08-19 DIAGNOSIS — K219 Gastro-esophageal reflux disease without esophagitis: Secondary | ICD-10-CM | POA: Diagnosis present

## 2023-08-19 DIAGNOSIS — K7031 Alcoholic cirrhosis of liver with ascites: Secondary | ICD-10-CM

## 2023-08-19 DIAGNOSIS — R21 Rash and other nonspecific skin eruption: Secondary | ICD-10-CM | POA: Diagnosis present

## 2023-08-19 DIAGNOSIS — D61818 Other pancytopenia: Secondary | ICD-10-CM | POA: Diagnosis present

## 2023-08-19 DIAGNOSIS — W19XXXA Unspecified fall, initial encounter: Secondary | ICD-10-CM | POA: Diagnosis present

## 2023-08-19 DIAGNOSIS — E2740 Unspecified adrenocortical insufficiency: Secondary | ICD-10-CM | POA: Diagnosis not present

## 2023-08-19 DIAGNOSIS — I4891 Unspecified atrial fibrillation: Secondary | ICD-10-CM | POA: Diagnosis present

## 2023-08-19 DIAGNOSIS — N184 Chronic kidney disease, stage 4 (severe): Secondary | ICD-10-CM | POA: Diagnosis present

## 2023-08-19 LAB — COMPREHENSIVE METABOLIC PANEL WITH GFR
ALT: 22 U/L (ref 0–44)
AST: 37 U/L (ref 15–41)
Albumin: 1.8 g/dL — ABNORMAL LOW (ref 3.5–5.0)
Alkaline Phosphatase: 192 U/L — ABNORMAL HIGH (ref 38–126)
Anion gap: 12 (ref 5–15)
BUN: 17 mg/dL (ref 8–23)
CO2: 21 mmol/L — ABNORMAL LOW (ref 22–32)
Calcium: 8.5 mg/dL — ABNORMAL LOW (ref 8.9–10.3)
Chloride: 105 mmol/L (ref 98–111)
Creatinine, Ser: 1.9 mg/dL — ABNORMAL HIGH (ref 0.44–1.00)
GFR, Estimated: 28 mL/min — ABNORMAL LOW (ref >60)
Glucose, Bld: 126 mg/dL — ABNORMAL HIGH (ref 70–99)
Potassium: 3.6 mmol/L (ref 3.5–5.1)
Sodium: 138 mmol/L (ref 135–145)
Total Bilirubin: 1.3 mg/dL — ABNORMAL HIGH (ref 0.0–1.2)
Total Protein: 5.6 g/dL — ABNORMAL LOW (ref 6.5–8.1)

## 2023-08-19 LAB — CBC WITH DIFFERENTIAL/PLATELET
Abs Immature Granulocytes: 0.01 K/uL (ref 0.00–0.07)
Basophils Absolute: 0 K/uL (ref 0.0–0.1)
Basophils Relative: 0 %
Eosinophils Absolute: 0 K/uL (ref 0.0–0.5)
Eosinophils Relative: 1 %
HCT: 28.2 % — ABNORMAL LOW (ref 36.0–46.0)
Hemoglobin: 8.7 g/dL — ABNORMAL LOW (ref 12.0–15.0)
Immature Granulocytes: 0 %
Lymphocytes Relative: 5 %
Lymphs Abs: 0.2 K/uL — ABNORMAL LOW (ref 0.7–4.0)
MCH: 34.7 pg — ABNORMAL HIGH (ref 26.0–34.0)
MCHC: 30.9 g/dL (ref 30.0–36.0)
MCV: 112.4 fL — ABNORMAL HIGH (ref 80.0–100.0)
Monocytes Absolute: 0.1 K/uL (ref 0.1–1.0)
Monocytes Relative: 3 %
Neutro Abs: 3.8 K/uL (ref 1.7–7.7)
Neutrophils Relative %: 91 %
Platelets: 111 K/uL — ABNORMAL LOW (ref 150–400)
RBC: 2.51 MIL/uL — ABNORMAL LOW (ref 3.87–5.11)
RDW: 15.2 % (ref 11.5–15.5)
Smear Review: NORMAL
WBC: 4.1 K/uL (ref 4.0–10.5)
nRBC: 0 % (ref 0.0–0.2)

## 2023-08-19 LAB — TROPONIN I (HIGH SENSITIVITY)
Troponin I (High Sensitivity): 11 ng/L (ref <18)
Troponin I (High Sensitivity): 12 ng/L (ref <18)

## 2023-08-19 LAB — D-DIMER, QUANTITATIVE: D-Dimer, Quant: 10 ug{FEU}/mL — ABNORMAL HIGH (ref 0.00–0.50)

## 2023-08-19 MED ORDER — LIDOCAINE 5 % EX PTCH
3.0000 | MEDICATED_PATCH | CUTANEOUS | Status: DC
  Administered 2023-08-19: 1 via TRANSDERMAL
  Administered 2023-08-20: 3 via TRANSDERMAL
  Filled 2023-08-19 (×2): qty 3

## 2023-08-19 MED ORDER — ACETAMINOPHEN 650 MG RE SUPP
650.0000 mg | Freq: Four times a day (QID) | RECTAL | Status: DC | PRN
Start: 2023-08-19 — End: 2023-08-20

## 2023-08-19 MED ORDER — HYDROCODONE-ACETAMINOPHEN 5-325 MG PO TABS: 1.0000 | ORAL_TABLET | Freq: Three times a day (TID) | ORAL | Status: DC | PRN

## 2023-08-19 MED ORDER — ALBUTEROL SULFATE (2.5 MG/3ML) 0.083% IN NEBU: 2.5000 mg | INHALATION_SOLUTION | Freq: Four times a day (QID) | RESPIRATORY_TRACT | Status: DC | PRN

## 2023-08-19 MED ORDER — ENOXAPARIN SODIUM 40 MG/0.4ML IJ SOSY
40.0000 mg | PREFILLED_SYRINGE | INTRAMUSCULAR | Status: DC
  Administered 2023-08-19: 40 mg via SUBCUTANEOUS
  Filled 2023-08-19: qty 0.4

## 2023-08-19 MED ORDER — ROPINIROLE HCL 1 MG PO TABS
0.5000 mg | ORAL_TABLET | Freq: Every day | ORAL | Status: DC
Start: 2023-08-19 — End: 2023-08-20
  Administered 2023-08-19: 0.5 mg via ORAL
  Filled 2023-08-19: qty 1

## 2023-08-19 MED ORDER — PANTOPRAZOLE SODIUM 40 MG PO TBEC
40.0000 mg | DELAYED_RELEASE_TABLET | Freq: Two times a day (BID) | ORAL | Status: DC
  Administered 2023-08-19: 40 mg via ORAL
  Filled 2023-08-19: qty 1

## 2023-08-19 MED ORDER — ONDANSETRON HCL 4 MG/2ML IJ SOLN
4.0000 mg | Freq: Once | INTRAMUSCULAR | Status: AC
  Administered 2023-08-19: 4 mg via INTRAVENOUS
  Filled 2023-08-19: qty 2

## 2023-08-19 MED ORDER — HEPARIN (PORCINE) 25000 UT/250ML-% IV SOLN: 12.0000 [IU]/kg/h | INTRAVENOUS | Status: DC

## 2023-08-19 MED ORDER — ALBUMIN HUMAN 25 % IV SOLN
50.0000 g | Freq: Once | INTRAVENOUS | Status: AC
  Administered 2023-08-19: 50 g via INTRAVENOUS
  Filled 2023-08-19: qty 200

## 2023-08-19 MED ORDER — MIDODRINE HCL 5 MG PO TABS
5.0000 mg | ORAL_TABLET | ORAL | Status: AC
  Administered 2023-08-19: 5 mg via ORAL
  Filled 2023-08-19: qty 1

## 2023-08-19 MED ORDER — HYDROCORTISONE 20 MG PO TABS
20.0000 mg | ORAL_TABLET | Freq: Every day | ORAL | Status: DC
  Administered 2023-08-19: 20 mg via ORAL
  Filled 2023-08-19: qty 1

## 2023-08-19 MED ORDER — FLUTICASONE FUROATE-VILANTEROL 100-25 MCG/ACT IN AEPB
1.0000 | INHALATION_SPRAY | Freq: Every day | RESPIRATORY_TRACT | Status: DC | PRN
  Filled 2023-08-19: qty 28

## 2023-08-19 MED ORDER — MIDODRINE HCL 5 MG PO TABS
15.0000 mg | ORAL_TABLET | Freq: Three times a day (TID) | ORAL | Status: DC
  Administered 2023-08-19: 15 mg via ORAL
  Filled 2023-08-19: qty 3

## 2023-08-19 MED ORDER — SODIUM CHLORIDE 0.9 % IV SOLN: INTRAVENOUS | Status: DC

## 2023-08-19 MED ORDER — SODIUM CHLORIDE 0.9 % IV BOLUS
500.0000 mL | Freq: Once | INTRAVENOUS | Status: AC
  Administered 2023-08-19: 500 mL via INTRAVENOUS

## 2023-08-19 MED ORDER — OXYCODONE HCL 5 MG PO TABS
5.0000 mg | ORAL_TABLET | ORAL | Status: DC | PRN
  Administered 2023-08-19 (×2): 5 mg via ORAL
  Filled 2023-08-19 (×2): qty 1

## 2023-08-19 MED ORDER — MIDODRINE HCL 5 MG PO TABS
5.0000 mg | ORAL_TABLET | Freq: Once | ORAL | Status: AC
  Administered 2023-08-19: 5 mg via ORAL
  Filled 2023-08-19: qty 1

## 2023-08-19 MED ORDER — PROCHLORPERAZINE EDISYLATE 10 MG/2ML IJ SOLN
5.0000 mg | Freq: Four times a day (QID) | INTRAMUSCULAR | Status: DC | PRN
  Administered 2023-08-20: 5 mg via INTRAVENOUS
  Filled 2023-08-19: qty 2

## 2023-08-19 MED ORDER — HEPARIN (PORCINE) 25000 UT/250ML-% IV SOLN
1300.0000 [IU]/h | INTRAVENOUS | Status: DC
  Administered 2023-08-19: 1300 [IU]/h via INTRAVENOUS
  Filled 2023-08-19: qty 250

## 2023-08-19 MED ORDER — ACETAMINOPHEN 325 MG PO TABS: 650.0000 mg | ORAL_TABLET | Freq: Four times a day (QID) | ORAL | Status: DC | PRN

## 2023-08-19 MED ORDER — ALBUMIN HUMAN 25 % IV SOLN
25.0000 g | INTRAVENOUS | Status: AC
  Administered 2023-08-19: 25 g via INTRAVENOUS
  Filled 2023-08-19: qty 100

## 2023-08-19 MED ORDER — SODIUM CHLORIDE 0.9% FLUSH: 3.0000 mL | Freq: Two times a day (BID) | INTRAVENOUS | Status: DC

## 2023-08-19 MED ORDER — HYDROCORTISONE SOD SUC (PF) 100 MG IJ SOLR: 100.0000 mg | Freq: Once | INTRAMUSCULAR | Status: DC

## 2023-08-19 MED ORDER — ALPRAZOLAM 0.5 MG PO TABS
0.5000 mg | ORAL_TABLET | Freq: Two times a day (BID) | ORAL | Status: DC | PRN
  Administered 2023-08-19: 0.5 mg via ORAL
  Filled 2023-08-19: qty 1

## 2023-08-19 MED ORDER — HEPARIN BOLUS VIA INFUSION
4500.0000 [IU] | Freq: Once | INTRAVENOUS | Status: AC
  Administered 2023-08-19: 4500 [IU] via INTRAVENOUS
  Filled 2023-08-19: qty 4500

## 2023-08-19 MED ORDER — FLUDROCORTISONE ACETATE 0.1 MG PO TABS
0.1000 mg | ORAL_TABLET | Freq: Every day | ORAL | Status: DC
  Administered 2023-08-19: 0.1 mg via ORAL
  Filled 2023-08-19: qty 1

## 2023-08-19 MED ORDER — MIDODRINE HCL 5 MG PO TABS
10.0000 mg | ORAL_TABLET | Freq: Three times a day (TID) | ORAL | Status: DC
  Administered 2023-08-19 (×3): 10 mg via ORAL
  Filled 2023-08-19 (×3): qty 2

## 2023-08-19 MED ORDER — NYSTATIN 100000 UNIT/GM EX POWD
Freq: Two times a day (BID) | CUTANEOUS | Status: DC
  Filled 2023-08-19 (×2): qty 15

## 2023-08-19 MED ORDER — GLUCAGON HCL RDNA (DIAGNOSTIC) 1 MG IJ SOLR
1.0000 mg | Freq: Once | INTRAMUSCULAR | Status: AC
  Administered 2023-08-19: 1 mg via INTRAVENOUS
  Filled 2023-08-19: qty 1

## 2023-08-19 NOTE — Plan of Care (Addendum)
 Patient is RN concerned and reported that patient is hypertensive however at the bedside after changing the blood pressure cuff found out that patient is still hypotensive MAP is 54. Patient has history of hypotension with adrenal insufficiency - Increasing midodrine  10 to 15 mg 3 times daily - Continue hydrocortisone  - Giving albumin  25 g - Increasing rate of NS 125 to 150 cc/h - Changing care level to progress unit.  Verlene Glantz, MD [facility] 08/19/2023, 7:51 PM

## 2023-08-19 NOTE — Progress Notes (Signed)
 PHARMACY - ANTICOAGULATION CONSULT NOTE  Pharmacy Consult for heparin  Indication: VTE r/o  Allergies  Allergen Reactions   Elemental Sulfur Anaphylaxis   Sulfa Antibiotics Anaphylaxis   Prednisone Other (See Comments)    hallucinations   Duloxetine  Other (See Comments)    Per patient   Nsaids Other (See Comments)    Stomach pain, diarrhea   Other Other (See Comments)    Steroids: hallucinations, tolerates hydrocortisone      Patient Measurements: Height: 5' 1 (154.9 cm) Weight: 106 kg (233 lb 11 oz) IBW/kg (Calculated) : 47.8 HEPARIN  DW (KG): 73.6  Vital Signs: Temp: 98.1 F (36.7 C) (08/15 2327) Temp Source: Oral (08/15 2327) BP: 80/48 (08/16 0400) Pulse Rate: 62 (08/16 0400)  Labs: Recent Labs    08/19/23 0042 08/19/23 0254  HGB 8.7*  --   HCT 28.2*  --   PLT 111*  --   CREATININE 1.90*  --   TROPONINIHS 11 12    Estimated Creatinine Clearance: 31.8 mL/min (A) (by C-G formula based on SCr of 1.9 mg/dL (H)).   Medical History: Past Medical History:  Diagnosis Date   Allergy    spring time,no medications   Anxiety    Arthritis    back,knee,hands   Asthma    as a child   Depression    History of kidney stones    Hypertension    pt.denies updated 02/07/22   Insomnia    Seizures (HCC)    last 2 1/2 years ago   Assessment: 33 yoF presented to ED after fall. Pharmacy consulted to dose heparin  for VTE r/o. PMH includes HTN, alcoholic cirrhosis, CKD stage III, prior DVTs with IVC filter placed in 05/01/2023-not on any anticoagulant  given liver disease.  -D dimer >10  -Hgb 8.7, plts 111  Goal of Therapy:  Heparin  level 0.3-0.7 units/ml Monitor platelets by anticoagulation protocol: Yes   Plan:  Give 4500 units bolus x 1 Start heparin  infusion at 1300 units/hr Check anti-Xa level in 6 hours and daily while on heparin  Continue to monitor H&H and platelets  Lynwood Poplar, PharmD, BCPS Clinical Pharmacist 08/19/2023 4:16 AM

## 2023-08-19 NOTE — ED Notes (Addendum)
 Notified provider of blood pressure, orders entered.

## 2023-08-19 NOTE — H&P (Signed)
 History and Physical    Patient: Carla Leonard FMW:[id number] DOB: 08-07-1955 DOA: 08/18/2023 DOS: the patient was seen and examined on 08/19/2023 PCP: Ileen Rosaline NOVAK, NP  Patient coming from: Home  Chief Complaint:  Chief Complaint  Patient presents with   Fall   HPI: Carla Leonard is a 68 y.o. female with medical history significant of HTN, alcoholic liver cirrhosis, CKD 3, DVT s/p IVC filter, nonepileptic spells, adrenal insufficiency, history of GI bleed secondary to gastric and duodenal ulcers who presents with complaints of severe lower back pain radiating to the legs after a recent fall.  She has experienced multiple falls in the past week, with the most recent fall resulting in unbearable pain. She suspects a refracture of her spine, as it has been about ten weeks since her last spinal procedure. X-rays were taken, but she is unsure of the results. The pain is described as worse than when she initially fell, and it radiates down her left leg and into the groin area of her right leg.  She uses a walker for mobility and has been experiencing difficulty maneuvering due to her feet getting twisted. 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 No issues with breathing and she is not on oxygen.  She reports that she has not experienced atrial fibrillation before. She was previously hospitalized for a blood clot in the right leg and had an umbral stent placed in her neck. She also had a previous clot in her left leg, which caused complications when compression devices were used.  She is currently on hospice care and desires comfort care only, with no resuscitation if she experiences a heart attack. She has been experiencing fluid retention in her legs, which are wrapped to prevent fluid from reaching her heart. She was previously on spironolactone  and Lasix , but these were adjusted due to low blood pressure.  She reports staying cold all  the time, even in warm weather, and has a history of low blood counts requiring transfusion. No fever, chills, or dark stools, and her bowel movements are typically one to two times daily, though she experienced diarrhea and vomiting over the weekend.  She has a history of skin issues, including heat rashes and sensitivity to tape, requiring bandages instead. She recalls using a powder during a previous hospitalization that helped with skin irritation, but she does not remember the name.  Review of records note patient was last hospitalized 4/22-4/29 with decompensated cirrhosis requiring paracentesis.  Also found to have a right leg DVT for which patient was started on heparin , but complicated by GI bleeding for which patient underwent EGD revealing duodenal and gastric ulcers.  Due to patient's comorbidities IR have been consulted and placed IVC filter.   In the emergency department patient was noted to be afebrile with respirations 13-22, blood pressures 80/48 to 105/50, and O2 saturation maintained on room air.  Labs significant for hemoglobin 8.7, platelets 111, BUN 17, creatinine 1.9, alkaline phosphatase 192, total bilirubin 1.3, D-dimer 10, high-sensitivity troponins negative x 2.  Chest x-ray noted no acute abnormality.  X-rays of the lumbar spine revealed stable moderate chronic compression fractures at L1 and L5.  Patient had been given 1.5 L of IV fluid, Zofran , midodrine , hydrocortisone , glucagon  1 mg, albumin  50 g, and started on a heparin  drip.  Review of Systems: As mentioned in the history of present illness. All other systems reviewed and are negative. Past Medical History:  Diagnosis Date   Allergy    spring time,no medications   Anxiety    Arthritis    back,knee,hands   Asthma    as a child   Depression    History of kidney stones    Hypertension    pt.denies updated 02/07/22   Insomnia    Seizures (HCC)    last 2 1/2 years ago   Past Surgical History:  Procedure  Laterality Date   APPENDECTOMY     BIOPSY  08/09/2021   Procedure: BIOPSY;  Surgeon: Wilhelmenia Aloha Raddle., MD;  Location: [facility];  Service: Gastroenterology;;   CHOLECYSTECTOMY     COLONOSCOPY WITH PROPOFOL  N/A 02/24/2022   Procedure: COLONOSCOPY WITH PROPOFOL ;  Surgeon: Federico Rosario BROCKS, MD;  Location: [facility];  Service: Gastroenterology;  Laterality: N/A;   ELBOW SURGERY     RIGHT   ESOPHAGOGASTRODUODENOSCOPY N/A 08/09/2021   Procedure: ESOPHAGOGASTRODUODENOSCOPY (EGD);  Surgeon: Wilhelmenia Aloha Raddle., MD;  Location: [facility];  Service: Gastroenterology;  Laterality: N/A;   ESOPHAGOGASTRODUODENOSCOPY N/A 04/30/2023   Procedure: EGD (ESOPHAGOGASTRODUODENOSCOPY);  Surgeon: Rollin Dover, MD;  Location[facility];  Service: Gastroenterology;  Laterality: N/A;   ESOPHAGOGASTRODUODENOSCOPY (EGD) WITH PROPOFOL  N/A 06/30/2022   Procedure: ESOPHAGOGASTRODUODENOSCOPY (EGD) WITH PROPOFOL ;  Surgeon: Federico Rosario BROCKS, MD;  Location: [facility];  Service: Gastroenterology;  Laterality: N/A;   EUS N/A 08/09/2021   Procedure: UPPER ENDOSCOPIC ULTRASOUND (EUS) LINEAR ;  Surgeon: Wilhelmenia Aloha Raddle., MD;  Location: [facility];  Service: Gastroenterology;  Laterality: N/A;   HEMOSTASIS CLIP PLACEMENT  08/09/2021   Procedure: HEMOSTASIS CLIP PLACEMENT;  Surgeon: Wilhelmenia Aloha Raddle., MD;  Location: [facility];  Service: Gastroenterology;;   HEMOSTASIS CLIP PLACEMENT  06/30/2022   Procedure: HEMOSTASIS CLIP PLACEMENT;  Surgeon: Federico Rosario BROCKS, MD;  Location: [facility];  Service: Gastroenterology;;   HOT HEMOSTASIS N/A 08/09/2021   Procedure: HOT HEMOSTASIS (ARGON PLASMA COAGULATION/BICAP);  Surgeon: Wilhelmenia Aloha Raddle., MD;  Location: [facility];  Service: Gastroenterology;  Laterality: N/A;   IR IVC FILTER PLMT / S&I /IMG GUID/MOD SED  05/01/2023   IR PARACENTESIS  04/28/2021   IR PARACENTESIS  05/06/2021   IR PARACENTESIS  05/11/2021   IR PARACENTESIS  04/25/2023   JOINT REPLACEMENT      right TKA 06/2010   KNEE ARTHROPLASTY  06/17/2011   Procedure: COMPUTER ASSISTED TOTAL KNEE ARTHROPLASTY;  Surgeon: Norleen LITTIE Gavel, MD;  Location: [facility];  Service: Orthopedics;  Laterality: Left;  TOTAL KNEE REPLACEMENT WITH GENERAL ANESTHESIA AND PRE OP FEMORAL NERVE BLOCK   PERIANAL CYST     POLYPECTOMY  08/09/2021   Procedure: POLYPECTOMY;  Surgeon: Wilhelmenia Aloha Raddle., MD;  Location: [facility];  Service: Gastroenterology;;   POLYPECTOMY  02/24/2022   Procedure: POLYPECTOMY;  Surgeon: Federico Rosario BROCKS, MD;  Location: [facility];  Service: Gastroenterology;;   POLYPECTOMY  06/30/2022   Procedure: POLYPECTOMY;  Surgeon: Federico Rosario BROCKS, MD;  Location: [facility];  Service: Gastroenterology;;   SUBMUCOSAL TATTOO INJECTION  08/09/2021   Procedure: SUBMUCOSAL TATTOO INJECTION;  Surgeon: Wilhelmenia Aloha Raddle., MD;  Location: [facility];  Service: Gastroenterology;;   Social History:  reports that she has quit smoking. Her smoking use included cigarettes. She has never used smokeless tobacco. She reports that she does not currently use alcohol . She reports that she does not currently use drugs after having used the following drugs: Marijuana.  Allergies  Allergen Reactions   Elemental Sulfur Anaphylaxis   Sulfa Antibiotics Anaphylaxis   Prednisone Other (See Comments)  hallucinations   Duloxetine  Other (See Comments)    Per patient   Nsaids Other (See Comments)    Stomach pain, diarrhea   Other Other (See Comments)    Steroids: hallucinations, tolerates hydrocortisone      Family History  Problem Relation Age of Onset   Pneumonia Mother    Diabetes Father    Colon cancer Neg Hx    Esophageal cancer Neg Hx    Stomach cancer Neg Hx    Pancreatic cancer Neg Hx    Colon polyps Neg Hx    Crohn's disease Neg Hx    Rectal cancer Neg Hx    Ulcerative colitis Neg Hx     Prior to Admission medications   Medication Sig Start Date End Date Taking? Authorizing Provider  albuterol   (VENTOLIN  HFA) 108 (90 Base) MCG/ACT inhaler Inhale 2 puffs into the lungs every 6 (six) hours as needed for wheezing or shortness of breath.   Yes [provider]  ALPRAZolam  (XANAX ) 0.5 MG tablet Take 0.5 mg by mouth. 06/16/23  Yes [provider]  BREO ELLIPTA  100-25 MCG/ACT AEPB Inhale 1 puff into the lungs as needed (SOB and wheezing). 01/19/23  Yes [provider]  Cranberry 500 MG CAPS Take 1 capsule by mouth daily.   Yes [provider]  furosemide  (LASIX ) 20 MG tablet Take 1 tablet (20 mg total) by mouth daily. 05/02/23 05/01/24 Yes Shahmehdi, Adriana LABOR, MD  HYDROcodone -acetaminophen  (NORCO/VICODIN) 5-325 MG tablet Take 1 tablet by mouth 3 (three) times daily as needed for moderate pain (pain score 4-6).   Yes [provider]  midodrine  (PROAMATINE ) 10 MG tablet Take 1 tablet (10 mg total) by mouth 3 (three) times daily with meals. 04/12/21  Yes Singh, Prashant K, MD  promethazine  (PHENERGAN ) 12.5 MG tablet Take 1 tablet (12.5 mg total) by mouth every 6 (six) hours as needed for nausea or vomiting. 08/06/21  Yes Federico Rosario BROCKS, MD  rOPINIRole  (REQUIP ) 0.25 MG tablet Take 0.5 mg by mouth at bedtime. May take 1 to 3 hours before bedtime   Yes [provider]  acetaminophen  (TYLENOL ) 500 MG tablet Take 1,500 mg by mouth daily as needed. Low back pain Patient not taking: Reported on 08/19/2023    [provider]  b complex vitamins capsule Take 1 capsule by mouth daily. Patient not taking: Reported on 08/19/2023    [provider]  carvedilol  (COREG ) 3.125 MG tablet Take 1 tablet (3.125 mg total) by mouth 2 (two) times daily with a meal. Patient not taking: Reported on 08/19/2023 05/02/23 06/01/23  Shahmehdi, Seyed A, MD  Cholecalciferol (VITAMIN D3) 1000 units CAPS Take 1,000 Units by mouth daily. Patient not taking: Reported on 08/19/2023    [provider]  ferrous sulfate  325 (65 FE) MG EC tablet Take 325 mg by mouth daily  with breakfast. Patient not taking: Reported on 08/19/2023    [provider]  folic acid  (FOLVITE ) 1 MG tablet Take 1 tablet (1 mg total) by mouth daily. Patient not taking: Reported on 08/19/2023 05/12/21   Jillian Buttery, MD  hydrocortisone  (CORTEF ) 10 MG tablet Take 2 tablets (20 mg total) by mouth daily.  Do not stop it abruptly unless told by the endocrinologist. Patient not taking: Reported on 08/19/2023 04/12/21   Singh, Prashant K, MD  pantoprazole  (PROTONIX ) 40 MG tablet Take 1 tablet (40 mg total) by mouth 2 (two) times daily. Patient not taking: Reported on 08/19/2023 05/02/23   Willette Adriana LABOR, MD  potassium chloride  SA (KLOR-CON  M) 20 MEQ tablet TAKE 1 TABLET TWICE DAILY Patient not taking: Reported on 08/19/2023 06/20/22   Federico Rosario BROCKS, MD    Physical Exam: Vitals:   08/19/23 0500 08/19/23 0530 08/19/23 0600 08/19/23 0647  BP: (!) 81/44 (!) 84/50 (!) 81/44 (!) 88/54  Pulse: 93 93 92   Resp: 18 13 18    Temp:      TempSrc:      SpO2: 100% 100% 99%   Weight:      Height:        Constitutional:  ill-appearing elderly female Eyes: PERRL, lids and conjunctivae normal ENMT: Mucous membranes are moist.    Neck: normal, supple  Respiratory: clear to auscultation bilaterally, no wheezing, no crackles. Normal respiratory effort. No accessory muscle use.  Cardiovascular: Irregular irregular. No extremity edema  No carotid bruits.  Abdomen: Protuberant abdomen.  No tenderness to palpation.  Bowel sounds positive.  Musculoskeletal: no clubbing / cyanosis. No joint deformity upper and lower extremities. Good ROM, no contractures. Normal muscle tone.  Skin: Bilateral upper extremities currently wrapped with bruising noted around both upper extremities.  Erythema noted of the right thigh as seen below Neurologic: CN 2-12 grossly intact.  Strength 5/5 in all 4.  Psychiatric: Normal judgment and insight. Alert and oriented x 3. Normal mood.   Data Reviewed:   EKG revealed  possible atrial fibrillation and 147 bpm.  Reviewed labs, imaging, and pertinent records as documented.  Assessment and Plan:  Fall, at home Lower back pain Lumbar compression fractures Patient after having a fall at home with increased back pain.  Notes that she has been having increasing falls.  X-rays revealed stable moderate L1 and L5 compression fractures.  Hydrocodone  had not been helping with her symptoms. Normally getting around with use of a walker, but has not been able to do so since. - Admit to the progressive bed - Up with assistance - Oxycodone  as needed for pain - PT to eval and treat  Hypotension Adrenal insufficiency Acute on chronic.  Blood pressures noted to be as low as 72/49.  Patient had been given IV fluid boluses and albumin  with transient improvement. - Continue midodrine  and hydrocortisone  - Continue IV fluids for now - Held diuretics  New onset atrial fibrillation Patient noted to be in atrial fibrillation with heart rates initially elevated into the 140s.  Heart rates improved after initial IV fluids.  Previously not thought to be a anticoagulation candidate due comorbidities and GI bleeding during last admission. - Held Coreg  due to hypotension - Continue to monitor  Liver cirrhosis Patient has known liver disease for which she reports recently being placed on hospice.  Patient does not appear to require paracentesis at this time. - Held fursemide, spironolactone   - Consult palliative for assistance with goals of care and treatment  History of right leg DVT S/p IVC filter Patient was found during previous hospitalization in April to have a right lower leg DVT.  At that time patient was not thought to be a candidate for anticoagulation during that hospital visitation given comorbidities.  Pancytopenia Chronic.  Hemoglobin 8.7 with platelet count 111 which appears improved from prior. - Check CRP - Continue to monitor  Possible rash Patient has  erythema noted of the right thigh.  Patient reports similar in past that resolved with ointment/powder.  Unclear if this is a intertrigo or cellulitis - Follow-up CRP - Trial nystatin  powder  Chronic kidney disease stage IV Creatinine noted to  be 1.9 which appears around patient's baseline. - Continue to monitor  History of gastric/duodenal ulcers GERD - Continue Protonix  if patient so chooses  DVT prophylaxis: Lovenox  Advance Care Planning:   Code Status: Do not attempt resuscitation (DNR) - Comfort care    Consults: None  Family Communication: Patient's daughter updated over the phone  Severity of Illness: The appropriate patient status for this patient is OBSERVATION. Observation status is judged to be reasonable and necessary in order to provide the required intensity of service to ensure the patient's safety. The patient's presenting symptoms, physical exam findings, and initial radiographic and laboratory data in the context of their medical condition is felt to place them at decreased risk for further clinical deterioration. Furthermore, it is anticipated that the patient will be medically stable for discharge from the hospital within 2 midnights of admission.   Author: Maximino DELENA Sharps, MD 08/19/2023 7:10 AM  For on call review [email or web address].

## 2023-08-19 NOTE — ED Provider Notes (Signed)
 Case d/w Dr. Franky who will admit    Carla Wichert, MD 08/19/23 [phone]

## 2023-08-19 NOTE — ED Provider Notes (Signed)
 [place] EMERGENCY DEPARTMENT AT [facility] Provider Note   CSN: [id number] Arrival date & time: 08/18/23  2325     Patient presents with: [place]   Carla Leonard is a 68 y.o. female.   The history is provided by the patient and the EMS personnel.  Fall This is a new problem. The current episode started less than 1 hour ago. The problem occurs constantly. The problem has been resolved. Pertinent negatives include no abdominal pain, no headaches and no shortness of breath. Associated symptoms comments: Lost balance global weakness.  Did not hit head, no LOC no thinners . Nothing aggravates the symptoms. Nothing relieves the symptoms. She has tried nothing for the symptoms. The treatment provided no relief.  [name]     Prior to Admission medications   Medication Sig Start Date End Date Taking? Authorizing Provider  acetaminophen  (TYLENOL ) 500 MG tablet Take 1,500 mg by mouth daily as needed. Low back pain    [provider]  albuterol  (VENTOLIN  HFA) 108 (90 Base) MCG/ACT inhaler Inhale 2 puffs into the lungs every 6 (six) hours as needed for wheezing or shortness of breath.    [provider]  b complex vitamins capsule Take 1 capsule by mouth daily.    [provider]  BREO ELLIPTA  100-25 MCG/ACT AEPB Inhale 1 puff into the lungs as needed (SOB and wheezing). 01/19/23   [provider]  carvedilol  (COREG ) 3.125 MG tablet Take 1 tablet (3.125 mg total) by mouth 2 (two) times daily with a meal. 05/02/23 06/01/23  Shahmehdi, Adriana LABOR, MD  Cholecalciferol (VITAMIN D3) 1000 units CAPS Take 1,000 Units by mouth daily.    [provider]  Cranberry 500 MG CAPS Take 1 capsule by mouth daily.    [provider]  ferrous sulfate  325 (65 FE) MG EC tablet Take 325 mg by mouth daily with breakfast.    [provider]  folic acid  (FOLVITE ) 1 MG tablet Take 1 tablet (1 mg total) by mouth daily. 05/12/21   Jillian Buttery, MD   furosemide  (LASIX ) 20 MG tablet Take 1 tablet (20 mg total) by mouth daily. 05/02/23 05/01/24  Shahmehdi, Adriana LABOR, MD  hydrocortisone  (CORTEF ) 10 MG tablet Take 2 tablets (20 mg total) by mouth daily.  Do not stop it abruptly unless told by the endocrinologist. 04/12/21   Singh, Prashant K, MD  midodrine  (PROAMATINE ) 10 MG tablet Take 1 tablet (10 mg total) by mouth 3 (three) times daily with meals. 04/12/21   Singh, Prashant K, MD  pantoprazole  (PROTONIX ) 40 MG tablet Take 1 tablet (40 mg total) by mouth 2 (two) times daily. 05/02/23   Willette Adriana LABOR, MD  potassium chloride  SA (KLOR-CON  M) 20 MEQ tablet TAKE 1 TABLET TWICE DAILY 06/20/22   Federico Rosario BROCKS, MD  promethazine  (PHENERGAN ) 12.5 MG tablet Take 1 tablet (12.5 mg total) by mouth every 6 (six) hours as needed for nausea or vomiting. 08/06/21   Federico Rosario BROCKS, MD  rOPINIRole  (REQUIP ) 0.25 MG tablet Take 0.25 mg by mouth at bedtime. May take 1 to 3 hours before bedtime    [provider]  sucralfate  (CARAFATE ) 1 GM/10ML suspension TAKE 10 MLS (1 G TOTAL) BY MOUTH 2 (TWO) TIMES DAILY. 11/11/21 11/28/23  Mansouraty, Aloha Raddle., MD    Allergies: Elemental sulfur, Sulfa antibiotics, Prednisone, Duloxetine , Nsaids, and Other    Review of Systems  Respiratory:  Negative for shortness of breath.   Gastrointestinal:  Negative for abdominal pain.  Neurological:  Negative for headaches.    Updated Vital Signs BP (!) 89/46 (BP Location: Left Arm)   Pulse 92   Temp 98.1 F (36.7 C) (Oral)   Resp 16   Ht 5' 1 (1.549 m)   Wt 106 kg   SpO2 100%   BMI 44.15 kg/m   Physical Exam  (all labs ordered are listed, but only abnormal results are displayed) Labs Reviewed  CBC WITH DIFFERENTIAL/PLATELET - Abnormal; Notable for the following components:      Result Value   RBC 2.51 (*)    Hemoglobin 8.7 (*)    HCT 28.2 (*)    MCV 112.4 (*)    MCH 34.7 (*)    Platelets 111 (*)    Lymphs Abs 0.2 (*)    All other components within normal  limits  COMPREHENSIVE METABOLIC PANEL WITH GFR - Abnormal; Notable for the following components:   CO2 21 (*)    Glucose, Bld 126 (*)    Creatinine, Ser 1.90 (*)    Calcium 8.5 (*)    Total Protein 5.6 (*)    Albumin  1.8 (*)    Alkaline Phosphatase 192 (*)    Total Bilirubin 1.3 (*)    GFR, Estimated 28 (*)    All other components within normal limits  D-DIMER, QUANTITATIVE - Abnormal; Notable for the following components:   D-Dimer, Quant 10.00 (*)    All other components within normal limits  TROPONIN I (HIGH SENSITIVITY)  TROPONIN I (HIGH SENSITIVITY)    EKG: EKG Interpretation Date/Time:  Saturday August 19 2023 00:32:16 EDT Ventricular Rate:  147 PR Interval:    QRS Duration:  83 QT Interval:  314 QTC Calculation: 501 R Axis:   82  Text Interpretation: Atrial fibrillation Low voltage, precordial leads Artifact in lead(s) I II III aVR aVL aVF V1 Confirmed by Nettie, Daya Dutt ([id number]) on 08/19/2023 2:47:13 AM  Radiology: [facility] Chest Portable 1 View Result Date: 08/19/2023 CLINICAL DATA:  Fall EXAM: PORTABLE CHEST 1 VIEW COMPARISON:  04/25/2023 FINDINGS: Heart and mediastinal contours are within normal limits. No focal opacities or effusions. No acute bony abnormality. Aortic atherosclerosis. IMPRESSION: No active cardiopulmonary disease. Electronically Signed   By: Franky Crease M.D.   On: 08/19/2023 00:44   DG Lumbar Spine Complete Result Date: 08/19/2023 CLINICAL DATA:  Fall EXAM: LUMBAR SPINE - COMPLETE 4+ VIEW COMPARISON:  CT 12/09/2022 FINDINGS: Moderate chronic compression fractures at L1 and L5 are stable. No acute fracture. Degenerative disc and facet disease diffusely. IVC filter in place. IMPRESSION: Stable moderate chronic compression fractures at L1 and L5. Diffuse degenerative changes. No acute bony abnormality. Electronically Signed   By: Franky Crease M.D.   On: 08/19/2023 00:44     .Critical Care  Performed by: Nettie Earing, MD Authorized by: Nettie Earing, MD    Critical care provider statement:    Critical care time (minutes):  60   Critical care end time:  08/19/2023 4:50 AM   Critical care was necessary to treat or prevent imminent or life-threatening deterioration of the following conditions:  Cardiac failure and trauma   Critical care was time spent personally by me on the following activities:  Development of treatment plan with patient or surrogate, discussions with consultants, evaluation of patient's response to treatment, examination of patient, ordering and review of laboratory studies, ordering and review of radiographic studies, ordering and performing treatments and interventions, pulse oximetry, re-evaluation of patient's condition and review of old charts   I assumed  direction of critical care for this patient from another provider in my specialty: no     Care discussed with: admitting provider      Medications  0.9 %  sodium chloride  infusion ( Intravenous New Bag/Given 08/19/23 0305)  lidocaine  (LIDODERM ) 5 % 3 patch (1 patch Transdermal Patch Applied 08/19/23 0450)  albumin  human 25 % solution 50 g (has no administration in time range)  heparin  bolus via infusion 4,500 Units (has no administration in time range)  heparin  ADULT infusion 100 units/mL (25000 units/250mL) (has no administration in time range)  midodrine  (PROAMATINE ) tablet 10 mg (has no administration in time range)  hydrocortisone  (CORTEF ) tablet 20 mg (20 mg Oral Given 08/19/23 0450)  sodium chloride  0.9 % bolus 500 mL (0 mLs Intravenous Stopped 08/19/23 0409)  midodrine  (PROAMATINE ) tablet 5 mg (5 mg Oral Given 08/19/23 0039)  sodium chloride  0.9 % bolus 500 mL (0 mLs Intravenous Stopped 08/19/23 0409)  glucagon  (human recombinant) (GLUCAGEN ) injection 1 mg (1 mg Intravenous Given 08/19/23 0447)  sodium chloride  0.9 % bolus 500 mL (500 mLs Intravenous New Bag/Given 08/19/23 0446)  midodrine  (PROAMATINE ) tablet 5 mg (5 mg Oral Given 08/19/23 0449)                                      Medical Decision Making Patient with fall, new AFIb new O2 requirement not on thinners   Amount and/or Complexity of Data Reviewed Independent Historian: EMS    Details: See above  Labs: ordered.    Details: Negative trop 11/12 Ddimer > 10 markedly elevated.  Normal white count hemoglobin low 8.7, platelets low 111.  Normal sodium, AKI creatinine 1.9  Radiology: ordered and independent interpretation performed.    Details: Negative CXR ECG/medicine tests: ordered and independent interpretation performed. Decision-making details documented in ED Course.  Risk Prescription drug management. Decision regarding hospitalization. Risk Details: Has adrenal insufficiency but not on medication for this right now.  No h/o AFIB or O2 requirement but h/o DVT/PE.  This is likely the case but GFR too low for CTA.  Ddimer > 10 will need VQ in am but treating with heparin .  Hospitalist would like albumin .  Restarting meds for adrenal insufficiency as BP is not responsive to IVF. On hospice but wants some treatments      Final diagnoses:  Fall, initial encounter  Paroxysmal atrial fibrillation (HCC)  AKI (acute kidney injury) (HCC)   The patient appears reasonably stabilized for admission considering the current resources, flow, and capabilities available in the ED at this time, and I doubt any other [facility] requiring further screening and/or treatment in the ED prior to admission.  ED Discharge Orders     None          Lerline Valdivia, MD 08/19/23 [phone]

## 2023-08-20 DIAGNOSIS — Z7189 Other specified counseling: Secondary | ICD-10-CM

## 2023-08-20 DIAGNOSIS — Z515 Encounter for palliative care: Secondary | ICD-10-CM | POA: Diagnosis not present

## 2023-08-20 DIAGNOSIS — R4589 Other symptoms and signs involving emotional state: Secondary | ICD-10-CM | POA: Diagnosis not present

## 2023-08-20 DIAGNOSIS — W19XXXD Unspecified fall, subsequent encounter: Secondary | ICD-10-CM

## 2023-08-20 DIAGNOSIS — M545 Low back pain, unspecified: Secondary | ICD-10-CM | POA: Diagnosis not present

## 2023-08-20 LAB — COMPREHENSIVE METABOLIC PANEL WITH GFR
ALT: 17 U/L (ref 0–44)
AST: 29 U/L (ref 15–41)
Albumin: 2.5 g/dL — ABNORMAL LOW (ref 3.5–5.0)
Alkaline Phosphatase: 106 U/L (ref 38–126)
Anion gap: 13 (ref 5–15)
BUN: 17 mg/dL (ref 8–23)
CO2: 16 mmol/L — ABNORMAL LOW (ref 22–32)
Calcium: 8.1 mg/dL — ABNORMAL LOW (ref 8.9–10.3)
Chloride: 108 mmol/L (ref 98–111)
Creatinine, Ser: 1.87 mg/dL — ABNORMAL HIGH (ref 0.44–1.00)
GFR, Estimated: 29 mL/min — ABNORMAL LOW (ref >60)
Glucose, Bld: 37 mg/dL — CL (ref 70–99)
Potassium: 3.5 mmol/L (ref 3.5–5.1)
Sodium: 137 mmol/L (ref 135–145)
Total Bilirubin: 2.7 mg/dL — ABNORMAL HIGH (ref 0.0–1.2)
Total Protein: 4.8 g/dL — ABNORMAL LOW (ref 6.5–8.1)

## 2023-08-20 LAB — CBC
HCT: 22.3 % — ABNORMAL LOW (ref 36.0–46.0)
Hemoglobin: 7.1 g/dL — ABNORMAL LOW (ref 12.0–15.0)
MCH: 35.9 pg — ABNORMAL HIGH (ref 26.0–34.0)
MCHC: 31.8 g/dL (ref 30.0–36.0)
MCV: 112.6 fL — ABNORMAL HIGH (ref 80.0–100.0)
Platelets: 54 K/uL — ABNORMAL LOW (ref 150–400)
RBC: 1.98 MIL/uL — ABNORMAL LOW (ref 3.87–5.11)
RDW: 15.1 % (ref 11.5–15.5)
WBC: 3.9 K/uL — ABNORMAL LOW (ref 4.0–10.5)
nRBC: 0 % (ref 0.0–0.2)

## 2023-08-20 LAB — GLUCOSE, CAPILLARY
Glucose-Capillary: 40 mg/dL — CL (ref 70–99)
Glucose-Capillary: 82 mg/dL (ref 70–99)

## 2023-08-20 LAB — LACTIC ACID, PLASMA: Lactic Acid, Venous: 5.3 mmol/L (ref 0.5–1.9)

## 2023-08-20 LAB — C-REACTIVE PROTEIN: CRP: 11 mg/dL — ABNORMAL HIGH (ref <1.0)

## 2023-08-20 MED ORDER — ATROPINE SULFATE 1 % OP SOLN: 4.0000 [drp] | OPHTHALMIC | Status: DC | PRN

## 2023-08-20 MED ORDER — DIPHENHYDRAMINE HCL 50 MG/ML IJ SOLN: 12.5000 mg | INTRAMUSCULAR | Status: DC | PRN

## 2023-08-20 MED ORDER — OCTREOTIDE ACETATE 100 MCG/ML IJ SOLN: 100.0000 ug | Freq: Three times a day (TID) | INTRAMUSCULAR | Status: DC | PRN

## 2023-08-20 MED ORDER — OXYCODONE HCL 5 MG PO TABS: 5.0000 mg | ORAL_TABLET | ORAL | Status: DC | PRN

## 2023-08-20 MED ORDER — HYDROCORTISONE SOD SUC (PF) 100 MG IJ SOLR
50.0000 mg | Freq: Two times a day (BID) | INTRAMUSCULAR | Status: DC
  Filled 2023-08-20: qty 1

## 2023-08-20 MED ORDER — HYDROCORTISONE SOD SUC (PF) 100 MG IJ SOLR: 100.0000 mg | Freq: Two times a day (BID) | INTRAMUSCULAR | Status: DC

## 2023-08-20 MED ORDER — ONDANSETRON HCL 4 MG/2ML IJ SOLN: 4.0000 mg | Freq: Four times a day (QID) | INTRAMUSCULAR | Status: DC | PRN

## 2023-08-20 MED ORDER — HYDROCORTISONE SOD SUC (PF) 100 MG IJ SOLR
100.0000 mg | Freq: Once | INTRAMUSCULAR | Status: AC
  Administered 2023-08-20: 100 mg via INTRAVENOUS
  Filled 2023-08-20: qty 2

## 2023-08-20 MED ORDER — BISACODYL 10 MG RE SUPP
10.0000 mg | Freq: Every day | RECTAL | Status: DC | PRN
Start: 2023-08-20 — End: 2023-08-20

## 2023-08-20 MED ORDER — BIOTENE DRY MOUTH MT LIQD
15.0000 mL | OROMUCOSAL | Status: DC | PRN
Start: 2023-08-20 — End: 2023-08-20

## 2023-08-20 MED ORDER — POLYVINYL ALCOHOL 1.4 % OP SOLN: 1.0000 [drp] | Freq: Four times a day (QID) | OPHTHALMIC | Status: DC | PRN

## 2023-08-20 MED ORDER — LORAZEPAM 2 MG/ML IJ SOLN: 1.0000 mg | INTRAMUSCULAR | Status: DC | PRN

## 2023-08-20 MED ORDER — OXYBUTYNIN CHLORIDE 5 MG PO TABS: 2.5000 mg | ORAL_TABLET | Freq: Four times a day (QID) | ORAL | Status: DC | PRN

## 2023-08-20 MED ORDER — ACETAMINOPHEN 650 MG RE SUPP: 650.0000 mg | Freq: Four times a day (QID) | RECTAL | Status: DC | PRN

## 2023-08-20 MED ORDER — HYDROMORPHONE HCL 1 MG/ML IJ SOLN: 0.5000 mg | INTRAMUSCULAR | Status: DC | PRN

## 2023-08-20 MED ORDER — HYDROCORTISONE SOD SUC (PF) 100 MG IJ SOLR: 50.0000 mg | Freq: Two times a day (BID) | INTRAMUSCULAR | Status: DC

## 2023-08-20 MED ORDER — ACETAMINOPHEN 325 MG PO TABS: 650.0000 mg | ORAL_TABLET | Freq: Four times a day (QID) | ORAL | Status: DC | PRN

## 2023-08-20 MED ORDER — DEXTROSE 50 % IV SOLN
INTRAVENOUS | Status: AC
  Filled 2023-08-20: qty 50

## 2023-08-20 MED ORDER — ONDANSETRON 4 MG PO TBDP
4.0000 mg | ORAL_TABLET | Freq: Four times a day (QID) | ORAL | Status: DC | PRN
Start: 2023-08-20 — End: 2023-08-20

## 2023-08-20 MED ORDER — SODIUM CHLORIDE 0.9 % IV BOLUS
1000.0000 mL | Freq: Once | INTRAVENOUS | Status: AC
  Administered 2023-08-20: 1000 mL via INTRAVENOUS

## 2023-08-20 MED ORDER — LORAZEPAM 2 MG/ML PO CONC: 1.0000 mg | ORAL | Status: DC | PRN

## 2023-08-20 MED ORDER — LORAZEPAM 1 MG PO TABS
1.0000 mg | ORAL_TABLET | ORAL | Status: DC | PRN
  Administered 2023-08-20: 1 mg via ORAL
  Filled 2023-08-20: qty 1

## 2023-08-20 NOTE — Progress Notes (Addendum)
 Patient was seen for hypotension.   She has hx of cirrhosis, CKD 3, adrenal insufficiency, chronic hypotension on midodrine , GI bleeding, and DVT s/p IVC filter who presented with severe low back pain after a fall. She was found to have lumbar compression fractures, new atrial fibrillation, and hypotension and was admitted this morning.   SBP was in the 60s and and 70s. She has already received 15 mg midodrine , albumin , and 1.5 liters NS.   She is alert, complaining of low back pain, but denies chest pain or lightheadedness.   Plan to check CBC, CMP, and lactic acid, and to give 100 mg IV hydrocortisone  and 1 liter NS now.   Discussed the plan with patient and her daughter at the bedside. Patient confirms that she would not want aggressive measures.   Addendum: Patient remains hypotensive despite IVF, albumin , midodrine , and hydrocortisone . Her labs are worsening. She still wakes to voice and is oriented. We discussed discontinuing cardiac monitoring, frequent vitals, and lab draws, and solely focusing on treatment of her symptoms. She demonstrates understanding of her current situation and agrees with focusing on her comfort only at this point. Her daughter was updated by phone.

## 2023-08-20 NOTE — Progress Notes (Signed)
 Dr Charlton paged to inform him of patient's BPs.  Patient is alert and oriented and no c/o pain.  Orders received from him and he stated that he would be up to see the patient.

## 2023-08-20 NOTE — Discharge Summary (Signed)
 Physician Discharge Summary  Carla Leonard FMW:[id number] DOB: 10/09/1955 DOA: 08/18/2023  PCP: Ileen Rosaline NOVAK, NP  Admit date: 08/18/2023 Discharge date: 08/20/2023  Admitted From: home Disposition: Home with hospice  Recommendations for Outpatient Follow-up:  Follow up with hospice  Discharge Condition: stable CODE STATUS: DNR Diet Orders (From admission, onward)     Start     Ordered   08/20/23 1034  Diet regular Room service appropriate? Yes with Assist; Fluid consistency: Thin  Diet effective now       Question Answer Comment  Room service appropriate? Yes with Assist   Fluid consistency: Thin      08/20/23 1035            HPI: Per admitting MD, Carla Leonard is a 68 y.o. female with medical history significant of HTN, alcoholic liver cirrhosis, CKD 3, DVT s/p IVC filter, nonepileptic spells, adrenal insufficiency, history of GI bleed secondary to gastric and duodenal ulcers who presents with complaints of severe lower back pain radiating to the legs after a recent fall. She has experienced multiple falls in the past week, with the most recent fall resulting in unbearable pain. She suspects a refracture of her spine, as it has been about ten weeks since her last spinal procedure. X-rays were taken, but she is unsure of the results. The pain is described as worse than when she initially fell, and it radiates down her left leg and into the groin area of her right leg. She uses a walker for mobility and has been experiencing difficulty maneuvering due to her feet getting twisted. 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 No issues with breathing and she is not on oxygen. She reports that she has not experienced atrial fibrillation before. She was previously hospitalized for a blood clot in the right leg and had an umbral stent placed in her neck. She also had a previous clot in her left leg, which caused complications when  compression devices were used. She is currently on hospice care and desires comfort care only, with no resuscitation if she experiences a heart attack. She has been experiencing fluid retention in her legs, which are wrapped to prevent fluid from reaching her heart. She was previously on spironolactone  and Lasix , but these were adjusted due to low blood pressure. She reports staying cold all the time, even in warm weather, and has a history of low blood counts requiring transfusion. No fever, chills, or dark stools, and her bowel movements are typically one to two times daily, though she experienced diarrhea and vomiting over the weekend. She has a history of skin issues, including heat rashes and sensitivity to tape, requiring bandages instead. She recalls using a powder during a previous hospitalization that helped with skin irritation, but she does not remember the name. Review of records note patient was last hospitalized 4/22-4/29 with decompensated cirrhosis requiring paracentesis.  Also found to have a right leg DVT for which patient was started on heparin , but complicated by GI bleeding for which patient underwent EGD revealing duodenal and gastric ulcers.  Due to patient's comorbidities IR have been consulted and placed IVC filter. In the emergency department patient was noted to be afebrile with respirations 13-22, blood pressures 80/48 to 105/50, and O2 saturation maintained on room air.  Labs significant for hemoglobin 8.7, platelets 111, BUN 17, creatinine 1.9, alkaline phosphatase 192, total bilirubin 1.3, D-dimer 10, high-sensitivity troponins negative x 2.  Chest x-ray noted no acute abnormality.  X-rays of the lumbar spine revealed stable moderate chronic compression fractures at L1 and L5.  Patient had been given 1.5 L of IV fluid, Zofran , midodrine , hydrocortisone , glucagon  1 mg, albumin  50 g, and started on a heparin  drip.  Hospital Course / Discharge diagnoses: Principal Problem:    Fall Active Problems:   Lumbar back pain   Hypotension   Adrenal insufficiency (HCC)   Atrial fibrillation (HCC)   Alcoholic cirrhosis of liver with ascites (HCC)   History of DVT (deep vein thrombosis)   Pancytopenia (HCC)   Rash   CKD (chronic kidney disease), stage IV (HCC)   GERD (gastroesophageal reflux disease)   Principal problem Goals of care-patient clearly expressed her wishes that she wants to be home, not in a hospital, and wants to focus mostly on comfort.  She is already established with hospice.  Palliative care consulted and also evaluated patient.  Will transition to comfort and allow patient to return home under the care of hospice.  She expressed to me that her wishes are not to return to the hospital under any circumstances  Active problems Fall, at home Lower back pain Lumbar compression fractures Hypotension Adrenal insufficiency New onset atrial fibrillation Liver cirrhosis History of right leg DVT S/p IVC filter, not anticoagulation candidate Pancytopenia Possible rash Chronic kidney disease stage IV History of gastric/duodenal ulcers GERD  Sepsis ruled out   Discharge Instructions   Allergies as of 08/20/2023       Reactions   Elemental Sulfur Anaphylaxis   Sulfa Antibiotics Anaphylaxis   Prednisone Other (See Comments)   hallucinations   Duloxetine  Other (See Comments)   Per patient   Nsaids Other (See Comments)   Stomach pain, diarrhea   Other Other (See Comments)   Steroids: hallucinations, tolerates hydrocortisone          Medication List     STOP taking these medications    carvedilol  3.125 MG tablet Commonly known as: COREG        TAKE these medications    acetaminophen  500 MG tablet Commonly known as: TYLENOL  Take 1,500 mg by mouth daily as needed. Low back pain   albuterol  108 (90 Base) MCG/ACT inhaler Commonly known as: VENTOLIN  HFA Inhale 2 puffs into the lungs every 6 (six) hours as needed for wheezing or  shortness of breath.   ALPRAZolam  0.5 MG tablet Commonly known as: XANAX  Take 0.5 mg by mouth.   b complex vitamins capsule Take 1 capsule by mouth daily.   Breo Ellipta  100-25 MCG/ACT Aepb Generic drug: fluticasone  furoate-vilanterol Inhale 1 puff into the lungs as needed (SOB and wheezing).   Cranberry 500 MG Caps Take 1 capsule by mouth daily.   ferrous sulfate  325 (65 FE) MG EC tablet Take 325 mg by mouth daily with breakfast.   folic acid  1 MG tablet Commonly known as: FOLVITE  Take 1 tablet (1 mg total) by mouth daily.   furosemide  20 MG tablet Commonly known as: Lasix  Take 1 tablet (20 mg total) by mouth daily.   HYDROcodone -acetaminophen  5-325 MG tablet Commonly known as: NORCO/VICODIN Take 1 tablet by mouth 3 (three) times daily as needed for moderate pain (pain score 4-6).   hydrocortisone  10 MG tablet Commonly known as: CORTEF  Take 2 tablets (20 mg total) by mouth daily.  Do not stop it abruptly unless told by the endocrinologist.   midodrine  10 MG tablet Commonly known as: PROAMATINE  Take 1 tablet (10 mg total) by mouth 3 (three)  times daily with meals.   pantoprazole  40 MG tablet Commonly known as: PROTONIX  Take 1 tablet (40 mg total) by mouth 2 (two) times daily.   potassium chloride  SA 20 MEQ tablet Commonly known as: KLOR-CON  M TAKE 1 TABLET TWICE DAILY   promethazine  12.5 MG tablet Commonly known as: PHENERGAN  Take 1 tablet (12.5 mg total) by mouth every 6 (six) hours as needed for nausea or vomiting.   rOPINIRole  0.5 MG tablet Commonly known as: REQUIP  Take 0.5 mg by mouth at bedtime. May take 1 to 3 hours before bedtime   Vitamin D3 1000 units Caps Take 1,000 Units by mouth daily.       Consultations: Palliative care  Procedures/Studies:  DG Chest Portable 1 View Result Date: 08/19/2023 CLINICAL DATA:  Fall EXAM: PORTABLE CHEST 1 VIEW COMPARISON:  04/25/2023 FINDINGS: Heart and mediastinal contours are within normal limits. No focal  opacities or effusions. No acute bony abnormality. Aortic atherosclerosis. IMPRESSION: No active cardiopulmonary disease. Electronically Signed   By: Franky Crease M.D.   On: 08/19/2023 00:44   DG Lumbar Spine Complete Result Date: 08/19/2023 CLINICAL DATA:  Fall EXAM: LUMBAR SPINE - COMPLETE 4+ VIEW COMPARISON:  CT 12/09/2022 FINDINGS: Moderate chronic compression fractures at L1 and L5 are stable. No acute fracture. Degenerative disc and facet disease diffusely. IVC filter in place. IMPRESSION: Stable moderate chronic compression fractures at L1 and L5. Diffuse degenerative changes. No acute bony abnormality. Electronically Signed   By: Franky Crease M.D.   On: 08/19/2023 00:44   IR ABDOMEN US  LIMITED Result Date: 07/26/2023 INDICATION: 68 year old female with recurrent ascites. EXAM: ULTRASOUND ABDOMEN LIMITED FINDINGS: Imaging of all 4 quadrants of the abdomen reveal no ascites. Severe anasarca visualized. IMPRESSION: No ascites.  Paracentesis deferred. Performed By Lavanda Jurist, PA-C Electronically Signed   By: JONETTA Faes M.D.   On: 07/26/2023 15:53     Subjective: - no chest pain, shortness of breath, no abdominal pain, nausea or vomiting.   Discharge Exam: BP (!) 76/52   Pulse 91   Temp 98.6 F (37 C) (Oral)   Resp 17   Ht 5' 1 (1.549 m)   Wt 106 kg   SpO2 99%   BMI 44.15 kg/m   General: Pt is alert, awake, not in acute distress Cardiovascular: RRR, S1/S2 +, no rubs, no gallops Respiratory: CTA bilaterally, no wheezing, no rhonchi Abdominal: Soft, NT, ND, bowel sounds + Extremities: no edema, no cyanosis    The results of significant diagnostics from this hospitalization (including imaging, microbiology, ancillary and laboratory) are listed below for reference.     Microbiology: No results found for this or any previous visit (from the past 240 hours).   Labs: Basic Metabolic Panel: Recent Labs  Lab 08/19/23 0042 08/20/23 0339  NA 138 137  K 3.6 3.5  CL 105 108   CO2 21* 16*  GLUCOSE 126* 37*  BUN 17 17  CREATININE 1.90* 1.87*  CALCIUM 8.5* 8.1*   Liver Function Tests: Recent Labs  Lab 08/19/23 0042 08/20/23 0339  AST 37 29  ALT 22 17  ALKPHOS 192* 106  BILITOT 1.3* 2.7*  PROT 5.6* 4.8*  ALBUMIN  1.8* 2.5*   CBC: Recent Labs  Lab 08/19/23 0042 08/20/23 0339  WBC 4.1 3.9*  NEUTROABS 3.8  --   HGB 8.7* 7.1*  HCT 28.2* 22.3*  MCV 112.4* 112.6*  PLT 111* 54*   CBG: Recent Labs  Lab 08/20/23 0445 08/20/23 0515  GLUCAP 40* 82   Hgb  A1c No results for input(s): HGBA1C in the last 72 hours. Lipid Profile No results for input(s): CHOL, HDL, LDLCALC, TRIG, CHOLHDL, LDLDIRECT in the last 72 hours. Thyroid  function studies No results for input(s): TSH, T4TOTAL, T3FREE, THYROIDAB in the last 72 hours.  Invalid input(s): FREET3 Urinalysis    Component Value Date/Time   COLORURINE YELLOW 04/26/2023 0547   APPEARANCEUR CLEAR 04/26/2023 0547   LABSPEC 1.011 04/26/2023 0547   PHURINE 5.0 04/26/2023 0547   GLUCOSEU NEGATIVE 04/26/2023 0547   HGBUR NEGATIVE 04/26/2023 0547   BILIRUBINUR NEGATIVE 04/26/2023 0547   KETONESUR NEGATIVE 04/26/2023 0547   PROTEINUR NEGATIVE 04/26/2023 0547   UROBILINOGEN 0.2 06/17/2011 0636   NITRITE NEGATIVE 04/26/2023 0547   LEUKOCYTESUR TRACE (A) 04/26/2023 0547    FURTHER DISCHARGE INSTRUCTIONS:   Get Medicines reviewed and adjusted: Please take all your medications with you for your next visit with your Primary MD   Laboratory/radiological data: Please request your Primary MD to go over all hospital tests and procedure/radiological results at the follow up, please ask your Primary MD to get all Hospital records sent to his/her office.   In some cases, they will be blood work, cultures and biopsy results pending at the time of your discharge. Please request that your primary care M.D. goes through all the records of your hospital data and follows up on these results.    Also Note the following: If you experience worsening of your admission symptoms, develop shortness of breath, life threatening emergency, suicidal or homicidal thoughts you must seek medical attention immediately by calling 911 or calling your MD immediately  if symptoms less severe.   You must read complete instructions/literature along with all the possible adverse reactions/side effects for all the Medicines you take and that have been prescribed to you. Take any new Medicines after you have completely understood and accpet all the possible adverse reactions/side effects.    Do not drive when taking Pain medications or sleeping medications (Benzodaizepines)   Do not take more than prescribed Pain, Sleep and Anxiety Medications. It is not advisable to combine anxiety,sleep and pain medications without talking with your primary care practitioner   Special Instructions: If you have smoked or chewed Tobacco  in the last 2 yrs please stop smoking, stop any regular Alcohol   and or any Recreational drug use.   Wear Seat belts while driving.   Please note: You were cared for by a hospitalist during your hospital stay. Once you are discharged, your primary care physician will handle any further medical issues. Please note that NO REFILLS for any discharge medications will be authorized once you are discharged, as it is imperative that you return to your primary care physician (or establish a relationship with a primary care physician if you do not have one) for your post hospital discharge needs so that they can reassess your need for medications and monitor your lab values.  Time coordinating discharge: 40 minutes  SIGNED:  Nilda Fendt, MD, PhD 08/20/2023, 11:07 AM

## 2023-08-20 NOTE — TOC Transition Note (Signed)
 Transition of Care (TOC) - Discharge Note Rayfield Gobble RN, BSN Inpatient Care Management Unit 4E- RN Case Manager See Treatment Team for direct phone #   Patient Details  Name: Carla Leonard MRN: [id number] Date of Birth: [birth year]  Transition of Care [facility]) CM/SW Contact:  Gobble Rayfield Hurst, RN Phone Number: 08/20/2023, 12:00 PM   Clinical Narrative:    Msg received from [facility] regarding Home Hospice- pt currently active with Hospice at home but unsure which agency.   CM reached out to [facility]- pt was active with them for [facility]- discharged back in February- they do not have documentation of any Hospice referral. Also reached out to [facility]- liaison confirmed pt is not active with them.   CM spoke with pt at bedside- she voiced that maybe she has [facility]- CM is not familiar with that Hospice- pt confirmed she has all needed DME. Family will transport home.  Daughter and grand-daughter arrived while CM at bedside- Daughter Donovan voiced that she had sent msg to Hospice RN- [facility] regarding pt being in hospital- pt sent msg to Hospice RN with CM contact info requesting a call.  [facility] RN #- [phone]- given to CM by MD.    TC made to [facility]- with VM left- awaiting return call.   1045- Return call received- [facility] confirmed that pt is active for Home Hospice- agency is [facility]- she will f/u with their office to make sure they are ok with pt's discharge for today.   1100- Per [facility] w/ [facility] they are good with pt's discharge home, [facility] will see pt with visit in the home tomorrow. Updated [facility] with regard to daughter's request for [employer]. [facility] to follow up.  [facility] also requesting that pt's status be reviewed for Obs- msg sent to UR team.   Pt will transition home today with resumption of Home Hospice- [facility].  Daughter at bedside and will transport home.   No further CM needs noted.    Final next level of care: Home w Hospice  Care Barriers to Discharge: No Barriers Identified   Patient Goals and CMS Choice Patient states their goals for this hospitalization and ongoing recovery are:: return home with hospice   Choice offered to / list presented to : Patient, Adult Children      Discharge Placement               Home w/ Hospice         Discharge Plan and Services Additional resources added to the After Visit Summary for     Discharge Planning Services: CM Consult Post Acute Care Choice: Home Health, Resumption of Svcs/PTA Provider          DME Arranged: N/A DME Agency: NA       HH Arranged: Disease Management HH Agency: Other - See comment Psychologist, forensic Hospice) Date [facility] Agency Contacted: 08/20/23 Time HH Agency Contacted: 1035 Representative spoke with at [facility] Agency: [facility]  Social Drivers of Health (SDOH) Interventions SDOH Screenings   Food Insecurity: No Food Insecurity (04/25/2023)  Housing: Low Risk  (04/25/2023)  Transportation Needs: No Transportation Needs (04/25/2023)  Utilities: Not At Risk (04/25/2023)  Social Connections: Socially Isolated (04/25/2023)  Tobacco Use: Medium Risk (08/18/2023)     Readmission Risk Interventions    08/20/2023   12:00 PM 05/02/2023    9:23 AM 04/26/2023    2:35 PM  Readmission Risk Prevention Plan  Transportation Screening Complete Complete Complete  PCP or Specialist Appt within 3-5  Days  Complete Complete  HRI or Home Care Consult Complete Complete Complete  Social Work Consult for Recovery Care Planning/Counseling Complete Complete Complete  Palliative Care Screening Complete Not Applicable Not Applicable  Medication Review Oceanographer) Complete Referral to Pharmacy Referral to Pharmacy

## 2023-08-20 NOTE — Progress Notes (Signed)
 Dr Charlton paged and given update on patient - BPs remain low, Blood glucose level 36-40 and patient given 1 amp of D50,  Patients lactic acid level is high.  Stated that he would come up and speak with the patient is regards to comfort care

## 2023-08-20 NOTE — Care Management Obs Status (Signed)
 MEDICARE OBSERVATION STATUS NOTIFICATION   Patient Details  Name: Carla Leonard MRN: [id number] Date of Birth: 1955/06/11   Medicare Observation Status Notification Given:  Yes    Charlann Rayfield Hurst, RN 08/20/2023, 11:54 AM

## 2023-08-20 NOTE — Progress Notes (Signed)
 Dr Charlton in to see patient and discuss POC with her and her daughter.  He answered all of their questions.

## 2023-08-20 NOTE — Consult Note (Signed)
 Palliative Medicine Inpatient Consult Note  Consulting Provider: Dr. Claudene  Reason for consult:  Patient recently placed on hospice wanted to verify goals of care and help with symptom management  08/20/2023  HPI:  Per intake H&P --> Carla Leonard is a 68 y.o. female with medical history significant of HTN, alcoholic liver cirrhosis, CKD 3, DVT s/p IVC filter, nonepileptic spells, adrenal insufficiency, history of GI bleed secondary to gastric and duodenal ulcers who presents with complaints of severe lower back pain radiating to the legs after a recent fall.  Palliative care requested to further delineate goals of care.   Clinical Assessment/Goals of Care:  *Please note that this is a verbal dictation therefore any spelling or grammatical errors are due to the Dragon Medical One system interpretation.  I have reviewed medical records including EPIC notes, labs and imaging, received report from bedside RN, assessed the patient who is lying in bed in no acute distress.    I met with Carla Leonard to further discuss diagnosis prognosis, GOC, EOL wishes, disposition and options.   I introduced Palliative Medicine as specialized medical care for people living with serious illness. It focuses on providing relief from the symptoms and stress of a serious illness. The goal is to improve quality of life for both the patient and the family.  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  Social History:  Carla Leonard shares that she lives with her daughter Carla Leonard in [place], [place] .  She is not presently married she has 1 daughter and 2 grandchildren.  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  She endorses getting great  enjoyment out of spending time with her family.  Carla Leonard shares she is a woman of faith practicing within Christianity.  Functional and Nutritional State:  Carla Leonard herself feels her function was well preceding hospitalization.  She shares she was able to do things for herself though slowly requiring the use of mobility aids.  She has had a robust appetite.  Palliative Symptoms:  Carla Leonard has generalized pain though she does receive oxycodone  which appears to help this discomfort.  Advance Directives:  A detailed discussion was had today regarding advanced directives.  There are no designated advanced directives on file.  Carla Leonard does share if unable to make decisions for herself she would designate her daughter, Carla Leonard to be her surrogate Management consultant.  Code Status:  Concepts specific to code status, artifical feeding and hydration, continued IV antibiotics and rehospitalization was had.  The difference between a aggressive medical intervention path  and a palliative comfort care path for this patient at this time was had.   Carla Leonard is an established DO NOT RESUSCITATE DO NOT INTUBATE CODE STATUS.  Discussion:  Carla Leonard and I discussed her decline in health over the past couple of years.  She shares about 6 years ago she noticed her health started declining in the setting of her liver disease.  We reviewed her ongoing ascites and her bodies compensatory mechanisms despite her disease burden.  She shares that for a long time she was able to compensate well until recently, in the last few months.  Over the last few months she has gradually declined becoming weaker, increased gait instability, and generally deconditioned.  We reviewed the effect of her chronic disease  processes on her generalized physical state.  We discussed how cirrhosis can affect other organ systems and ultimately lead to a terminal prognosis.  From the perspective of goals Carla Leonard shares that her desires to go home with hospice.   She and I reviewed the emphasis of comfort oriented care which she would like to pursue.  She shares that she just wants to be in the comforts of her familiar environment surrounded by the people she loves as she declines further over time.  I shared I would reach out to the case management team to identify how we could pursue transitioning [place] home in the oncoming day(s).   Discussed the importance of continued conversation with family and their  medical providers regarding overall plan of care and treatment options, ensuring decisions are within the context of the patients values and GOCs.  Decision Maker: Carla Leonard (Daughter): [phone] (Mobile)   SUMMARY OF RECOMMENDATIONS   DNAR/DNI  Education on cirrhosis and the effects of the disease process on other organ systems/the body  Patient's goals are to be comfortable and to transition home with hospice  Carla Leonard is established with hospice as of August 4th  TOC helping to identify if Carla Leonard can transition home today with hospice services  Ongoing palliative care support as needed  Code Status/Advance Care Planning: DNAR/DNI  Palliative Prophylaxis:  Aspiration, Bowel Regimen, Delirium Protocol, Frequent Pain Assessment, Oral Care, Palliative Wound Care, and Turn Reposition  Additional Recommendations (Limitations, Scope, Preferences): Continue comfort oriented care  Psycho-social/Spiritual:  Desire for further Chaplaincy support: Yes patient is a Carla Leonard Additional Recommendations: Education on chronic disease burden   Prognosis: Limited days to weeks  Discharge Planning: Discharge patient home with hospice  Vitals:   08/20/23 0400 08/20/23 0619  BP: (!) 69/38 (!) 76/52  Pulse: 92 91  Resp: 17 17  Temp:    SpO2: 95% 99%    Intake/Output Summary (Last 24 hours) at 08/20/2023 0711 Last data filed at 08/19/2023 1654 Gross per 24 hour  Intake 1665.39 ml  Output --  Net 1665.39 ml   Last Weight  Most recent  update: 08/18/2023 11:30 PM    Weight  106 kg (233 lb 11 oz)            Gen: Elderly Caucasian female chronically ill in appearance HEENT: Dry mucous membranes CV: Regular rate and rhythm  PULM: On room air breathing is even and nonlabored ABD: Distended EXT: Generalized edema Neuro: Alert and oriented x3   PPS: 30%   This conversation/these recommendations were discussed with patient primary care team, Dr. Trixie ______________________________________________________ [name] [facility] Palliative Medicine Team Team Cell Phone: [phone] Please utilize secure chat with additional questions, if there is no response within 30 minutes please call the above phone number  Total Time: 75 Billing based on MDM: High  Palliative Medicine Team providers are available by phone from 7am to 7pm daily and can be reached through the team cell phone.  Should this patient require assistance outside of these hours, please call the patient's attending physician.

## 2023-08-20 NOTE — Care Management CC44 (Signed)
 Condition Code 44 Documentation Completed  Patient Details  Name: Carla Leonard MRN: [id number] Date of Birth: 1955-03-06   Condition Code 44 given:  Yes Patient signature on Condition Code 44 notice:  Yes Documentation of 2 MD's agreement:  Yes Code 44 added to claim:  Yes    Charlann Rayfield Hurst, RN 08/20/2023, 11:54 AM

## 2023-08-21 ENCOUNTER — Other Ambulatory Visit: Payer: Self-pay

## 2023-08-21 ENCOUNTER — Emergency Department (HOSPITAL_COMMUNITY)
Admission: EM | Admit: 2023-08-21 | Discharge: 2023-09-04 | Disposition: E | Attending: Emergency Medicine | Admitting: Emergency Medicine

## 2023-08-21 ENCOUNTER — Emergency Department (HOSPITAL_COMMUNITY)

## 2023-08-21 DIAGNOSIS — Z515 Encounter for palliative care: Secondary | ICD-10-CM

## 2023-08-21 DIAGNOSIS — R0682 Tachypnea, not elsewhere classified: Secondary | ICD-10-CM | POA: Insufficient documentation

## 2023-08-21 DIAGNOSIS — I959 Hypotension, unspecified: Secondary | ICD-10-CM | POA: Diagnosis present

## 2023-08-21 DIAGNOSIS — S41112A Laceration without foreign body of left upper arm, initial encounter: Secondary | ICD-10-CM | POA: Insufficient documentation

## 2023-08-21 DIAGNOSIS — R7989 Other specified abnormal findings of blood chemistry: Secondary | ICD-10-CM | POA: Diagnosis not present

## 2023-08-21 DIAGNOSIS — X58XXXA Exposure to other specified factors, initial encounter: Secondary | ICD-10-CM | POA: Diagnosis not present

## 2023-08-21 DIAGNOSIS — S41111A Laceration without foreign body of right upper arm, initial encounter: Secondary | ICD-10-CM | POA: Diagnosis not present

## 2023-08-21 DIAGNOSIS — I129 Hypertensive chronic kidney disease with stage 1 through stage 4 chronic kidney disease, or unspecified chronic kidney disease: Secondary | ICD-10-CM | POA: Insufficient documentation

## 2023-08-21 DIAGNOSIS — Z7189 Other specified counseling: Secondary | ICD-10-CM | POA: Diagnosis not present

## 2023-08-21 DIAGNOSIS — N183 Chronic kidney disease, stage 3 unspecified: Secondary | ICD-10-CM | POA: Diagnosis not present

## 2023-08-21 DIAGNOSIS — R000 Tachycardia, unspecified: Secondary | ICD-10-CM | POA: Diagnosis not present

## 2023-08-21 DIAGNOSIS — Z79899 Other long term (current) drug therapy: Secondary | ICD-10-CM | POA: Insufficient documentation

## 2023-08-21 DIAGNOSIS — R140 Abdominal distension (gaseous): Secondary | ICD-10-CM | POA: Diagnosis not present

## 2023-08-21 LAB — CBC WITH DIFFERENTIAL/PLATELET
Abs Immature Granulocytes: 0.13 K/uL — ABNORMAL HIGH (ref 0.00–0.07)
Basophils Absolute: 0 K/uL (ref 0.0–0.1)
Basophils Relative: 1 %
Eosinophils Absolute: 0.3 K/uL (ref 0.0–0.5)
Eosinophils Relative: 19 %
HCT: 24.7 % — ABNORMAL LOW (ref 36.0–46.0)
Hemoglobin: 7.3 g/dL — ABNORMAL LOW (ref 12.0–15.0)
Immature Granulocytes: 10 %
Lymphocytes Relative: 7 %
Lymphs Abs: 0.1 K/uL — ABNORMAL LOW (ref 0.7–4.0)
MCH: 34.4 pg — ABNORMAL HIGH (ref 26.0–34.0)
MCHC: 29.6 g/dL — ABNORMAL LOW (ref 30.0–36.0)
MCV: 116.5 fL — ABNORMAL HIGH (ref 80.0–100.0)
Monocytes Absolute: 0.1 K/uL (ref 0.1–1.0)
Monocytes Relative: 5 %
Neutro Abs: 0.8 K/uL — ABNORMAL LOW (ref 1.7–7.7)
Neutrophils Relative %: 58 %
Platelets: 35 K/uL — ABNORMAL LOW (ref 150–400)
RBC: 2.12 MIL/uL — ABNORMAL LOW (ref 3.87–5.11)
RDW: 15.8 % — ABNORMAL HIGH (ref 11.5–15.5)
Smear Review: NORMAL
WBC: 1.4 K/uL — CL (ref 4.0–10.5)
nRBC: 1.5 % — ABNORMAL HIGH (ref 0.0–0.2)

## 2023-08-21 LAB — COMPREHENSIVE METABOLIC PANEL WITH GFR
ALT: 19 U/L (ref 0–44)
AST: 64 U/L — ABNORMAL HIGH (ref 15–41)
Albumin: 1.8 g/dL — ABNORMAL LOW (ref 3.5–5.0)
Alkaline Phosphatase: 75 U/L (ref 38–126)
Anion gap: 18 — ABNORMAL HIGH (ref 5–15)
BUN: 24 mg/dL — ABNORMAL HIGH (ref 8–23)
CO2: 11 mmol/L — ABNORMAL LOW (ref 22–32)
Calcium: 8.3 mg/dL — ABNORMAL LOW (ref 8.9–10.3)
Chloride: 108 mmol/L (ref 98–111)
Creatinine, Ser: 2.86 mg/dL — ABNORMAL HIGH (ref 0.44–1.00)
GFR, Estimated: 17 mL/min — ABNORMAL LOW (ref >60)
Glucose, Bld: 42 mg/dL — CL (ref 70–99)
Potassium: 3.2 mmol/L — ABNORMAL LOW (ref 3.5–5.1)
Sodium: 137 mmol/L (ref 135–145)
Total Bilirubin: 2.5 mg/dL — ABNORMAL HIGH (ref 0.0–1.2)
Total Protein: 4 g/dL — ABNORMAL LOW (ref 6.5–8.1)

## 2023-08-21 LAB — BRAIN NATRIURETIC PEPTIDE: B Natriuretic Peptide: 1596.5 pg/mL — ABNORMAL HIGH (ref 0.0–100.0)

## 2023-08-21 MED ORDER — ONDANSETRON 4 MG PO TBDP: 4.0000 mg | ORAL_TABLET | Freq: Four times a day (QID) | ORAL | Status: DC | PRN

## 2023-08-21 MED ORDER — ACETAMINOPHEN 650 MG RE SUPP: 650.0000 mg | Freq: Four times a day (QID) | RECTAL | Status: DC | PRN

## 2023-08-21 MED ORDER — HYDROMORPHONE HCL 1 MG/ML IJ SOLN
2.0000 mg | Freq: Once | INTRAMUSCULAR | Status: AC
  Administered 2023-08-21: 2 mg via INTRAVENOUS
  Filled 2023-08-21: qty 2

## 2023-08-21 MED ORDER — ACETAMINOPHEN 325 MG PO TABS: 650.0000 mg | ORAL_TABLET | Freq: Four times a day (QID) | ORAL | Status: DC | PRN

## 2023-08-21 MED ORDER — GLYCOPYRROLATE 1 MG PO TABS: 1.0000 mg | ORAL_TABLET | ORAL | Status: DC | PRN

## 2023-08-21 MED ORDER — FUROSEMIDE 10 MG/ML IJ SOLN
80.0000 mg | Freq: Once | INTRAMUSCULAR | Status: AC
  Administered 2023-08-21: 80 mg via INTRAVENOUS
  Filled 2023-08-21: qty 8

## 2023-08-21 MED ORDER — GLYCOPYRROLATE 0.2 MG/ML IJ SOLN: 0.2000 mg | INTRAMUSCULAR | Status: DC | PRN

## 2023-08-21 MED ORDER — GLYCOPYRROLATE 0.2 MG/ML IJ SOLN
0.2000 mg | INTRAMUSCULAR | Status: DC | PRN
Start: 2023-08-21 — End: 2023-08-21

## 2023-08-21 MED ORDER — BIOTENE DRY MOUTH MT LIQD: 15.0000 mL | OROMUCOSAL | Status: DC | PRN

## 2023-08-21 MED ORDER — HYDROMORPHONE HCL-NACL 50-0.9 MG/50ML-% IV SOLN: 2.0000 mg/h | INTRAVENOUS | Status: DC

## 2023-08-21 MED ORDER — HYDROMORPHONE BOLUS VIA INFUSION: 0.5000 mg | INTRAVENOUS | Status: DC | PRN

## 2023-08-21 MED ORDER — DEXTROSE 50 % IV SOLN
1.0000 | Freq: Once | INTRAVENOUS | Status: AC
  Administered 2023-08-21: 50 mL via INTRAVENOUS
  Filled 2023-08-21: qty 50

## 2023-08-21 MED ORDER — ONDANSETRON HCL 4 MG/2ML IJ SOLN: 4.0000 mg | Freq: Four times a day (QID) | INTRAMUSCULAR | Status: DC | PRN

## 2023-08-21 MED ORDER — POLYVINYL ALCOHOL 1.4 % OP SOLN: 1.0000 [drp] | Freq: Four times a day (QID) | OPHTHALMIC | Status: DC | PRN

## 2023-08-21 MED ORDER — DIAZEPAM 5 MG/ML IJ SOLN: 2.5000 mg | INTRAMUSCULAR | Status: DC | PRN

## 2023-08-21 MED ORDER — MORPHINE SULFATE (PF) 4 MG/ML IV SOLN
4.0000 mg | Freq: Once | INTRAVENOUS | Status: AC
  Administered 2023-08-21: 4 mg via INTRAVENOUS
  Filled 2023-08-21: qty 1

## 2023-09-04 DIAGNOSIS — 419620001 Death: Secondary | SNOMED CT | POA: Diagnosis not present

## 2023-09-04 NOTE — ED Provider Notes (Signed)
 [place] EMERGENCY DEPARTMENT AT [facility] Provider Note   CSN: [id number] Arrival date & time: 08/24/2023  9068     Patient presents with: Hypotension   Carla Leonard is a 68 y.o. female past medical history significant for hypertension, cirrhosis secondary to chronic alcohol  use, CKD 3, DVT status post IVC filter, adrenal insufficiency and chronic pain with recent hospitalization in which palliative care was consulted and patient's goals of care were to be comfortable establishing a DNAR/DNI status and going home with hospice who presents emergency department for shortness of breath and severe pain.  Patient states that since she was discharged and has been home she has been having worsening shortness of breath and acute on chronic pain.  Patient states that her home pain medicines are not helping with her chronic pain and she is having difficulty breathing.  Patient requests laboratory studies to be completed in order to determine why she is having increased shortness of breath and states she would like interventions performed to help her breathe better.  During initial evaluation patient is GCS 15, alert and oriented to person/place/time with ability to follow commands and answer questions appropriately.  During initial evaluation patient was able to state her goals of care and confirming her DO NOT RESUSCITATE and DO NOT INTUBATE request.  Patient states that at this time she would like assistance with her work of breathing and pain control.  Patient states that she would not want further life-saving measures.  Patient states that she would like to be admitted to the hospital for end-of-life discussions and is able to express her severity of illness.   HPI     Prior to Admission medications   Medication Sig Start Date End Date Taking? Authorizing Provider  acetaminophen  (TYLENOL ) 500 MG tablet Take 1,500 mg by mouth daily as needed. Low back pain Patient not taking:  Reported on 08/19/2023    [provider]  albuterol  (VENTOLIN  HFA) 108 (90 Base) MCG/ACT inhaler Inhale 2 puffs into the lungs every 6 (six) hours as needed for wheezing or shortness of breath.    [provider]  ALPRAZolam  (XANAX ) 0.5 MG tablet Take 0.5 mg by mouth. 06/16/23   [provider]  b complex vitamins capsule Take 1 capsule by mouth daily. Patient not taking: Reported on 08/19/2023    [provider]  BREO ELLIPTA  100-25 MCG/ACT AEPB Inhale 1 puff into the lungs as needed (SOB and wheezing). 01/19/23   [provider]  Cholecalciferol (VITAMIN D3) 1000 units CAPS Take 1,000 Units by mouth daily. Patient not taking: Reported on 08/19/2023    [provider]  Cranberry 500 MG CAPS Take 1 capsule by mouth daily.    [provider]  ferrous sulfate  325 (65 FE) MG EC tablet Take 325 mg by mouth daily with breakfast. Patient not taking: Reported on 08/19/2023    [provider]  folic acid  (FOLVITE ) 1 MG tablet Take 1 tablet (1 mg total) by mouth daily. Patient not taking: Reported on 08/19/2023 05/12/21   Jillian Buttery, MD  furosemide  (LASIX ) 20 MG tablet Take 1 tablet (20 mg total) by mouth daily. 05/02/23 05/01/24  Willette Adriana LABOR, MD  HYDROcodone -acetaminophen  (NORCO/VICODIN) 5-325 MG tablet Take 1 tablet by mouth 3 (three) times daily as needed for moderate pain (pain score 4-6).    [provider]  hydrocortisone  (CORTEF ) 10 MG tablet Take 2 tablets (20 mg total) by mouth daily.  Do not stop it abruptly unless  told by the endocrinologist. Patient not taking: Reported on 08/19/2023 04/12/21   Singh, Prashant K, MD  midodrine  (PROAMATINE ) 10 MG tablet Take 1 tablet (10 mg total) by mouth 3 (three) times daily with meals. 04/12/21   Singh, Prashant K, MD  pantoprazole  (PROTONIX ) 40 MG tablet Take 1 tablet (40 mg total) by mouth 2 (two) times daily. Patient not taking: Reported on 08/19/2023 05/02/23   Willette Adriana LABOR, MD  potassium chloride  SA (KLOR-CON  M) 20 MEQ tablet TAKE 1 TABLET TWICE DAILY Patient not taking: Reported on 08/19/2023 06/20/22   Federico Rosario BROCKS, MD  promethazine  (PHENERGAN ) 12.5 MG tablet Take 1 tablet (12.5 mg total) by mouth every 6 (six) hours as needed for nausea or vomiting. 08/06/21   Federico Rosario BROCKS, MD  rOPINIRole  (REQUIP ) 0.5 MG tablet Take 0.5 mg by mouth at bedtime. May take 1 to 3 hours before bedtime    [provider]    Allergies: Elemental sulfur, Sulfa antibiotics, Prednisone, Duloxetine , Nsaids, and Other    Review of Systems  Updated Vital Signs BP (!) 81/31   Pulse 74   Temp (!) 97.4 F (36.3 C) (Oral)   Resp (!) 0   SpO2 (!) 86%   Physical Exam Vitals reviewed.  Constitutional:      Appearance: She is ill-appearing.     Interventions: Nasal cannula in place.     Comments: Chronically ill-appearing on examination  HENT:     Head: Normocephalic.  Cardiovascular:     Rate and Rhythm: Tachycardia present. Rhythm irregularly irregular.     Pulses:          Radial pulses are 1+ on the right side and 1+ on the left side.  Pulmonary:     Effort: Tachypnea and respiratory distress present.     Breath sounds: No stridor. Rales present.  Abdominal:     General: There is distension.     Palpations: Abdomen is soft.     Tenderness: There is no abdominal tenderness.  Skin:    Comments: Bilateral upper and lower extremities with ecchymosis in different healing stages, abrasions and skin tears  Neurological:     Mental Status: She is alert.     (all labs ordered are listed, but only abnormal results are displayed) Labs Reviewed  CBC WITH DIFFERENTIAL/PLATELET - Abnormal; Notable for the following components:      Result Value   WBC 1.4 (*)    RBC 2.12 (*)    Hemoglobin 7.3 (*)    HCT 24.7 (*)    MCV 116.5 (*)    MCH 34.4 (*)    MCHC 29.6 (*)    RDW 15.8 (*)    Platelets 35 (*)    nRBC 1.5 (*)    Neutro Abs 0.8 (*)    Lymphs Abs 0.1  (*)    Abs Immature Granulocytes 0.13 (*)    All other components within normal limits  COMPREHENSIVE METABOLIC PANEL WITH GFR - Abnormal; Notable for the following components:   Potassium 3.2 (*)    CO2 11 (*)    Glucose, Bld 42 (*)    BUN 24 (*)    Creatinine, Ser 2.86 (*)    Calcium 8.3 (*)    Total Protein 4.0 (*)    Albumin  1.8 (*)    AST 64 (*)    Total Bilirubin 2.5 (*)    GFR, Estimated 17 (*)    Anion gap 18 (*)    All other components within  normal limits  BRAIN NATRIURETIC PEPTIDE - Abnormal; Notable for the following components:   B Natriuretic Peptide 1,596.5 (*)    All other components within normal limits    EKG: None  Radiology: No results found.   Procedures   Medications Ordered in the ED  acetaminophen  (TYLENOL ) tablet 650 mg (has no administration in time range)    Or  acetaminophen  (TYLENOL ) suppository 650 mg (has no administration in time range)  HYDROmorphone  (DILAUDID ) bolus via infusion 0.5 mg (has no administration in time range)  HYDROmorphone  (DILAUDID ) 50 mg in 50 mL NS (1mg /mL) premix infusion (has no administration in time range)  ondansetron  (ZOFRAN -ODT) disintegrating tablet 4 mg (has no administration in time range)    Or  ondansetron  (ZOFRAN ) injection 4 mg (has no administration in time range)  glycopyrrolate  (ROBINUL ) tablet 1 mg (has no administration in time range)    Or  glycopyrrolate  (ROBINUL ) injection 0.2 mg (has no administration in time range)    Or  glycopyrrolate  (ROBINUL ) injection 0.2 mg (has no administration in time range)  antiseptic oral rinse (BIOTENE) solution 15 mL (has no administration in time range)  artificial tears ophthalmic solution 1 drop (has no administration in time range)  diazepam  (VALIUM ) injection 2.5 mg (has no administration in time range)  morphine  (PF) 4 MG/ML injection 4 mg (4 mg Intravenous Given 2023/08/28 1003)  furosemide  (LASIX ) injection 80 mg (80 mg Intravenous Given 08-28-2023 1002)   dextrose  50 % solution 50 mL (50 mLs Intravenous Given Aug 28, 2023 1048)  HYDROmorphone  (DILAUDID ) injection 2 mg (2 mg Intravenous Given 28-Aug-2023 1209)    Clinical Course as of August 28, 2023 1253  Mon 28-Aug-2023  0954 EKG reviewed by me: A-fib RVR without obvious acute ischemia [AG]  1047 Palliative care consult placed [AG]    Clinical Course User Index [AG] Nada Chroman, DO                                 Medical Decision Making Amount and/or Complexity of Data Reviewed Labs: ordered.  Risk Prescription drug management.   On initial evaluation patient is in respiratory distress and hypoxic on nasal cannula therefore we will place patient on BiPAP for hypoxia and work of breathing.  Patient does confirm DNR/DNI status.  Patient states that she would like laboratory studies drawn in order to determine etiology of shortness of breath.  Patient states that she would like interventions performed to help with her breathing however does not want other invasive procedures done.  Patient states that her reasoning for coming to the emergency department with shortness of breath as well as end-of-life care.  Will obtain laboratory studies, EKG and chest x-ray.  Patient is in A-fib RVR however does not want interventions performed for elevated heart rate.  Patient was placed on BiPAP.  On reevaluation of the patient she no longer responds to verbal or physical stimulus and is becoming bradycardic.  BNP elevated therefore believe patient's presentation is secondary to acute on chronic heart failure exacerbation.  CMP with hypoglycemia therefore will give D50.  Spoke with patient's daughter over the phone regarding patient's declining clinical condition and patient's family will come to the emergency department.  Patient's daughter confirmed patient's wishes with no further acute interventions.  Consulted palliative care as they have followed this patient previously, they evaluated the patient at bedside.   Palliative care to place patient on comfort care with plan to start  Dilaudid  and remove BiPAP once family members are at bedside.  Once patient and family members arrived, BiPAP removed and Dilaudid  given.  Once BiPAP was removed patient expired. Patient evaluated at bedside and time of death occurred at 1214-09-14.      Final diagnoses:  Death due to natural causes    ED Discharge Orders     None       Lavanda Bolster DO Emergency Medicine PGY2    Bolster Lavanda, DO 2023/09/06 1253    Tonia Chew, MD 09/06/2023 1444

## 2023-09-04 NOTE — ED Triage Notes (Signed)
 Pt arrived via [facility] c/o SOB and low blood pressure. Pt has been on hospice care for the past week but did not meet criteria for in patient hospice so patient has not been able to reach comfortability at home. Hospice nurse discussed with EMS about the option to bring her in through the ED and move upstairs to palliative care until she qualifies for inpatient hospice.   Hx of CHF, CKD. Difficulty breathing for the past 3 days. EMS reports pt is in afib, rales at lung bases, low 90 sats on 4L.   EMS saw 41 CBG, gave 250 of D10 and it came up to 132.

## 2023-09-04 NOTE — ED Notes (Signed)
 Pt's HR noted to be 0, RR =0. MD notfied. Dr. Nada to bedside. No heart beat or breathing noted. Time of death noted to be 1216. Family at bedside and aware.

## 2023-09-04 NOTE — ED Notes (Signed)
 Pt's BP = 58/22, Dr. Zavitz at bedside. Attempted manual BP x 2. Pt not responding to stimuli. MD aware. Family aware. Will continue to monitor.

## 2023-09-04 NOTE — Consult Note (Signed)
 Palliative Medicine Inpatient Consult Note  Consulting Provider: Dr. Nada  Reason for consult:  Palliative Care Consult Services Palliative Medicine Consult  Reason for Consult? Hospice, worsening condition. symptom management    09/14/2023  HPI:  Per intake H&P --> Carla Leonard is a 68 y.o. female with medical history significant of HTN, alcoholic liver cirrhosis, CKD 3, DVT s/p IVC filter, nonepileptic spells, adrenal insufficiency, history of GI bleed secondary to gastric and duodenal ulcers who presents with complaints of severe lower back pain radiating to the legs after a recent fall.  Re-admitted for symptom management due to shortness of breath.  Clinical Assessment/Goals of Care:  *Please note that this is a verbal dictation therefore any spelling or grammatical errors are due to the Dragon Medical One system interpretation.  I have reviewed medical records including EPIC notes, labs and imaging, received report from bedside RN, assessed the patient who is lying in bed in no acute distress.    I met with Romero Louder to further discuss diagnosis prognosis, GOC, EOL wishes, disposition and options.   I introduced Palliative Medicine as specialized medical care for people living with serious illness. It focuses on providing relief from the symptoms and stress of a serious illness. The goal is to improve quality of life for both the patient and the family.  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  Social History:  Carla Leonard lives with her daughter Leonard in [place], [place] .  She is not presently married she has 1 daughter and 2 grandchildren.  She formerly worked as a Lawyer.  She got great enjoyment  out of spending time with her family.  Amylia shares she is a woman of faith practicing within Christianity.  Functional and Nutritional State:  Since going home has been bed bound. Not eating.    Advance Directives:  A detailed discussion was had today regarding advanced directives.  There are no designated advanced directives on file. Daughter, Leonard to be her surrogate Management consultant.  Code Status:  Concepts specific to code status, artifical feeding and hydration, continued IV antibiotics and rehospitalization was had.  The difference between a aggressive medical intervention path  and a palliative comfort care path for this patient at this time was had.   [name] is an established DO NOT RESUSCITATE DO NOT INTUBATE CODE STATUS.  Discussion:  I reviewed with Ambera's daughter that she is at end of life. I shared per conversation(s) prior the goals have been and remain to be towards comfort care. Abbie agrees. We discussed allowing family the time and space to say their goodbyes then removing Carla Leonard from [place] support and allowing her to pass away peacefully through the use of opiod medications.   Discussed the importance of continued conversation with family and their  medical providers regarding overall plan of care and treatment options, ensuring decisions are within the context of the patients values and GOCs.  Decision Maker: Carla Leonard (Daughter): [phone] (Mobile)   SUMMARY OF RECOMMENDATIONS   DNAR/DNI  Comfort Care  Initiate low dose dilaudid  gtt  Additional comfort medications per [facility]  Ongoing PMT support  Code Status/Advance Care Planning: DNAR/DNI  Palliative Prophylaxis:  Aspiration, Bowel Regimen, Delirium Protocol, Frequent Pain Assessment, Oral Care, Palliative Wound Care, and Turn Reposition  Additional Recommendations (Limitations, Scope,  Preferences): Continue comfort oriented care  Psycho-social/Spiritual:  Desire for further Chaplaincy  support: Yes patient is a [place] Additional Recommendations: Education on chronic disease burden   Prognosis: Limited to hours  Discharge Planning: Discharge will be [facility].  Vitals:   09/09/23 1130 09-09-23 1145  BP:    Pulse: (!) 31 74  Resp: (!) 25 (!) 24  Temp:    SpO2: (!) 89% (!) 86%   No intake or output data in the 24 hours ending Sep 09, 2023 1158  Gen: Elderly Caucasian female chronically ill in appearance HEENT: Dry mucous membranes CV: Regular rate and rhythm  PULM: On bipap ABD: Distended EXT: Generalized edema with multiple skin tears Neuro: [facility]  PPS: 30%   This conversation/these recommendations were discussed with patient primary care team, Dr. Nada ______________________________________________________ Carla Leonard [facility] Palliative Medicine Team Team Cell Phone: [phone] Please utilize secure chat with additional questions, if there is no response within 30 minutes please call the above phone number  Total Time: 75 Billing based on MDM: High  Palliative Medicine Team providers are available by phone from 7am to 7pm daily and can be reached through the team cell phone.  Should this patient require assistance outside of these hours, please call the patient's attending physician.

## 2023-09-04 NOTE — ED Notes (Addendum)
 Pt's 02 sat = 70s. MD at bedside, bipap 02 increased to 100%  and PEEP increased to 8.

## 2023-09-04 DEATH — deceased
